# Patient Record
Sex: Male | Born: 1945 | Hispanic: No | Marital: Married | Smoking: Never smoker
Health system: Southern US, Community
[De-identification: ages and names within clinical notes are randomized; demographics above are authoritative.]

## PROBLEM LIST (undated history)

## (undated) DIAGNOSIS — E785 Hyperlipidemia, unspecified: Secondary | ICD-10-CM

## (undated) DIAGNOSIS — J449 Chronic obstructive pulmonary disease, unspecified: Secondary | ICD-10-CM

## (undated) DIAGNOSIS — D510 Vitamin B12 deficiency anemia due to intrinsic factor deficiency: Secondary | ICD-10-CM

## (undated) DIAGNOSIS — F79 Unspecified intellectual disabilities: Secondary | ICD-10-CM

## (undated) DIAGNOSIS — E1129 Type 2 diabetes mellitus with other diabetic kidney complication: Secondary | ICD-10-CM

## (undated) DIAGNOSIS — N183 Chronic kidney disease, stage 3 (moderate): Secondary | ICD-10-CM

## (undated) DIAGNOSIS — I10 Essential (primary) hypertension: Secondary | ICD-10-CM

## (undated) SURGERY — Surgical Case
Anesthesia: *Unknown

---

## 1997-08-28 ENCOUNTER — Encounter (HOSPITAL_COMMUNITY): Admission: RE | Admit: 1997-08-28 | Discharge: 1997-11-26 | Payer: Self-pay | Admitting: Internal Medicine

## 2000-08-29 ENCOUNTER — Emergency Department (HOSPITAL_COMMUNITY): Admission: EM | Admit: 2000-08-29 | Discharge: 2000-08-29 | Payer: Self-pay | Admitting: Emergency Medicine

## 2001-01-25 ENCOUNTER — Emergency Department (HOSPITAL_COMMUNITY): Admission: EM | Admit: 2001-01-25 | Discharge: 2001-01-25 | Payer: Self-pay

## 2003-04-02 ENCOUNTER — Ambulatory Visit: Admission: RE | Admit: 2003-04-02 | Discharge: 2003-04-02 | Payer: Self-pay | Admitting: Internal Medicine

## 2009-09-29 ENCOUNTER — Emergency Department (HOSPITAL_COMMUNITY): Admission: EM | Admit: 2009-09-29 | Discharge: 2009-09-30 | Payer: Self-pay | Admitting: Emergency Medicine

## 2014-11-18 ENCOUNTER — Emergency Department (HOSPITAL_COMMUNITY): Payer: Medicare Other

## 2014-11-18 ENCOUNTER — Encounter (HOSPITAL_COMMUNITY): Payer: Self-pay | Admitting: Emergency Medicine

## 2014-11-18 ENCOUNTER — Emergency Department (HOSPITAL_COMMUNITY)
Admission: EM | Admit: 2014-11-18 | Discharge: 2014-11-18 | Disposition: A | Discharge status: Home / Self Care | Payer: Medicare Other | Attending: Emergency Medicine | Admitting: Emergency Medicine

## 2014-11-18 DIAGNOSIS — Z7982 Long term (current) use of aspirin: Secondary | ICD-10-CM | POA: Diagnosis not present

## 2014-11-18 DIAGNOSIS — W01198A Fall on same level from slipping, tripping and stumbling with subsequent striking against other object, initial encounter: Secondary | ICD-10-CM | POA: Diagnosis not present

## 2014-11-18 DIAGNOSIS — S0081XA Abrasion of other part of head, initial encounter: Secondary | ICD-10-CM | POA: Insufficient documentation

## 2014-11-18 DIAGNOSIS — Z79899 Other long term (current) drug therapy: Secondary | ICD-10-CM | POA: Diagnosis not present

## 2014-11-18 DIAGNOSIS — Z862 Personal history of diseases of the blood and blood-forming organs and certain disorders involving the immune mechanism: Secondary | ICD-10-CM | POA: Insufficient documentation

## 2014-11-18 DIAGNOSIS — Y9222 Religious institution as the place of occurrence of the external cause: Secondary | ICD-10-CM | POA: Diagnosis not present

## 2014-11-18 DIAGNOSIS — E119 Type 2 diabetes mellitus without complications: Secondary | ICD-10-CM | POA: Insufficient documentation

## 2014-11-18 DIAGNOSIS — Y998 Other external cause status: Secondary | ICD-10-CM | POA: Diagnosis not present

## 2014-11-18 DIAGNOSIS — E669 Obesity, unspecified: Secondary | ICD-10-CM | POA: Insufficient documentation

## 2014-11-18 DIAGNOSIS — E785 Hyperlipidemia, unspecified: Secondary | ICD-10-CM | POA: Diagnosis not present

## 2014-11-18 DIAGNOSIS — Y9389 Activity, other specified: Secondary | ICD-10-CM | POA: Diagnosis not present

## 2014-11-18 DIAGNOSIS — W19XXXA Unspecified fall, initial encounter: Secondary | ICD-10-CM

## 2014-11-18 DIAGNOSIS — Z794 Long term (current) use of insulin: Secondary | ICD-10-CM | POA: Insufficient documentation

## 2014-11-18 DIAGNOSIS — J449 Chronic obstructive pulmonary disease, unspecified: Secondary | ICD-10-CM | POA: Insufficient documentation

## 2014-11-18 DIAGNOSIS — Z23 Encounter for immunization: Secondary | ICD-10-CM | POA: Diagnosis not present

## 2014-11-18 DIAGNOSIS — I10 Essential (primary) hypertension: Secondary | ICD-10-CM | POA: Insufficient documentation

## 2014-11-18 DIAGNOSIS — Z8659 Personal history of other mental and behavioral disorders: Secondary | ICD-10-CM | POA: Insufficient documentation

## 2014-11-18 DIAGNOSIS — S0990XA Unspecified injury of head, initial encounter: Secondary | ICD-10-CM | POA: Diagnosis present

## 2014-11-18 HISTORY — DX: Hyperlipidemia, unspecified: E78.5

## 2014-11-18 HISTORY — DX: Essential (primary) hypertension: I10

## 2014-11-18 HISTORY — DX: Vitamin B12 deficiency anemia due to intrinsic factor deficiency: D51.0

## 2014-11-18 HISTORY — DX: Unspecified intellectual disabilities: F79

## 2014-11-18 HISTORY — DX: Chronic obstructive pulmonary disease, unspecified: J44.9

## 2014-11-18 MED ORDER — TETANUS-DIPHTH-ACELL PERTUSSIS 5-2.5-18.5 LF-MCG/0.5 IM SUSP
0.5000 mL | Freq: Once | INTRAMUSCULAR | Status: AC
  Administered 2014-11-18: 0.5 mL via INTRAMUSCULAR
  Filled 2014-11-18: qty 0.5

## 2014-11-18 MED ORDER — BACITRACIN ZINC 500 UNIT/GM EX OINT
TOPICAL_OINTMENT | CUTANEOUS | Status: AC
  Administered 2014-11-18: 16:00:00
  Filled 2014-11-18: qty 0.9

## 2014-11-18 NOTE — ED Notes (Addendum)
Pt arrived to ED with caregiver from [facility] c/o fall and head lac.  Reports pt fell going up the steps while at church. Denies LOC or n/v.  Head lac covered, bleeding controlled. Pt ambulatory. NAD.

## 2014-11-18 NOTE — Discharge Instructions (Signed)

## 2014-11-18 NOTE — ED Provider Notes (Signed)
CSN: [id number]     Arrival date & time 11/18/14  1147 History   First MD Initiated Contact with Patient 11/18/14 1504     Chief Complaint  Patient presents with  . Fall  . Head Injury    Low 5 caveat due to mental retardation (Consider location/radiation/quality/duration/timing/severity/associated sxs/prior Treatment) Patient is a 69 y.o. male presenting with fall and head injury. The history is provided by the patient.  Fall Associated symptoms include headaches. Pertinent negatives include no chest pain and no shortness of breath.  Head Injury Associated symptoms: headache   patient was at church and slipped going up some stairs and hit his head. No loss conscious but states he has headaches. Does have a history of mental retardation. Denies other pain. No numbness or weakness. No confusion. Abrasions to his forehead. Unknown last tetanus shot  Past Medical History  Diagnosis Date  . Mental retardation   . Hypertension   . Diabetes mellitus without complication   . COPD (chronic obstructive pulmonary disease)   . Pernicious anemia   . Obesity   . Hyperlipidemia    History reviewed. No pertinent past surgical history. History reviewed. No pertinent family history. Social History  Substance Use Topics  . Smoking status: None  . Smokeless tobacco: None  . Alcohol Use: No    Review of Systems  Unable to perform ROS Respiratory: Negative for shortness of breath.   Cardiovascular: Negative for chest pain.  Skin: Positive for wound.  Neurological: Positive for headaches.  Hematological: Does not bruise/bleed easily.  Psychiatric/Behavioral: Negative for agitation.      Allergies  Review of patient's allergies indicates no known allergies.  Home Medications   Prior to Admission medications   Medication Sig Start Date End Date Taking? Authorizing Provider  aspirin EC 81 MG tablet Take 81 mg by mouth daily.   Yes Historical Provider, MD  atorvastatin (LIPITOR) 80 MG  tablet Take 80 mg by mouth daily.   Yes Historical Provider, MD  Cyanocobalamin (VITAMIN B 12 PO) Take 1,000 mcg by mouth daily.   Yes Historical Provider, MD  diclofenac sodium (VOLTAREN) 1 % GEL Apply 2 g topically every 6 (six) hours as needed (pain.).   Yes Historical Provider, MD  docusate (COLACE) 50 MG/5ML liquid Take 10 mg by mouth daily. Into each ear.   Yes Historical Provider, MD  escitalopram (LEXAPRO) 10 MG tablet Take 10 mg by mouth daily.   Yes Historical Provider, MD  guaiFENesin (ROBITUSSIN) 100 MG/5ML liquid Take 200 mg by mouth every 4 (four) hours as needed for cough.   Yes Historical Provider, MD  insulin detemir (LEVEMIR) 100 UNIT/ML injection Inject 75-140 Units into the skin at bedtime. 75 units at breakfast and 140 units nightly.   Yes Historical Provider, MD  lisinopril (PRINIVIL,ZESTRIL) 30 MG tablet Take 30 mg by mouth 2 (two) times daily.   Yes Historical Provider, MD  metFORMIN (GLUCOPHAGE) 1000 MG tablet Take 1,000 mg by mouth 2 (two) times daily with a meal.   Yes Historical Provider, MD  Oxymetazoline HCl (QC NASAL RELIEF SINUS NA) Place 2 sprays into the nose every 12 (twelve) hours as needed (congestion).   Yes Historical Provider, MD  polyvinyl alcohol (LIQUIFILM TEARS) 1.4 % ophthalmic solution Place 1 drop into both eyes 2 (two) times daily.   Yes Historical Provider, MD  ranitidine (ZANTAC) 150 MG capsule Take 150 mg by mouth 2 (two) times daily.   Yes Historical Provider, MD  traMADol (ULTRAM) 50 MG  tablet Take 50 mg by mouth at bedtime.   Yes Historical Provider, MD  white petrolatum (VASELINE) GEL Apply 1 application topically at bedtime. Apply to heels   Yes Historical Provider, MD   BP 144/80 mmHg  Pulse 99  Temp(Src) 98.2 F (36.8 C) (Oral)  Resp 18  SpO2 94% Physical Exam  Constitutional: He appears well-developed.  HENT:  Abrasion to right forehead around 3 cm. There is one deeper area but no clear laceration.  Eyes: Pupils are equal, round, and  reactive to light.  Cardiovascular: Normal rate and regular rhythm.   Pulmonary/Chest: Effort normal.  Abdominal: Soft.  Musculoskeletal: Normal range of motion.  Neurological: He is alert.  Skin: Skin is warm.    ED Course  Procedures (including critical care time) Labs Review Labs Reviewed - No data to display  Imaging Review Ct Head Wo Contrast  11/18/2014   CLINICAL DATA:  Fall at skilled nursing facility with trauma to the head and laceration. [place] going up steps.  EXAM: CT HEAD WITHOUT CONTRAST  TECHNIQUE: Contiguous axial images were obtained from the base of the skull through the vertex without intravenous contrast.  COMPARISON:  None.  FINDINGS: Brain shows mild age related atrophy with mild chronic small-vessel changes of the white matter. No sign of acute infarction, mass lesion, hemorrhage, hydrocephalus or extra-axial collection. No skull fracture. No fluid in the sinuses. There is atherosclerotic calcification of the major vessels at the base of the brain.  IMPRESSION: No acute or traumatic intracranial finding. Atrophy and mild chronic small vessel disease.   Electronically Signed   By: Nelson Chimes M.D.   On: 11/18/2014 15:51   I have personally reviewed and evaluated these images and lab results as part of my medical decision-making.   EKG Interpretation None      MDM   Final diagnoses:  Fall, initial encounter  Forehead abrasion, initial encounter    Fall. Forehead abrasion without suturable lac. Negative head CT. Will discharge home.    Davonna Belling, MD 11/18/14 [id number]

## 2016-07-18 ENCOUNTER — Inpatient Hospital Stay (HOSPITAL_COMMUNITY): Payer: Medicare Other

## 2016-07-18 ENCOUNTER — Encounter (HOSPITAL_COMMUNITY): Payer: Self-pay | Admitting: Emergency Medicine

## 2016-07-18 ENCOUNTER — Inpatient Hospital Stay (HOSPITAL_COMMUNITY)
Admission: EM | Admit: 2016-07-18 | Discharge: 2016-07-23 | DRG: 208 | Disposition: A | Discharge status: Skilled Nursing Facility | Payer: Medicare Other | Attending: Internal Medicine | Admitting: Internal Medicine

## 2016-07-18 ENCOUNTER — Emergency Department (HOSPITAL_COMMUNITY): Payer: Medicare Other

## 2016-07-18 DIAGNOSIS — I10 Essential (primary) hypertension: Secondary | ICD-10-CM | POA: Diagnosis present

## 2016-07-18 DIAGNOSIS — J9600 Acute respiratory failure, unspecified whether with hypoxia or hypercapnia: Secondary | ICD-10-CM | POA: Diagnosis present

## 2016-07-18 DIAGNOSIS — D649 Anemia, unspecified: Secondary | ICD-10-CM | POA: Diagnosis present

## 2016-07-18 DIAGNOSIS — Z794 Long term (current) use of insulin: Secondary | ICD-10-CM | POA: Diagnosis not present

## 2016-07-18 DIAGNOSIS — J189 Pneumonia, unspecified organism: Principal | ICD-10-CM | POA: Diagnosis present

## 2016-07-18 DIAGNOSIS — R4182 Altered mental status, unspecified: Secondary | ICD-10-CM | POA: Diagnosis present

## 2016-07-18 DIAGNOSIS — Z781 Physical restraint status: Secondary | ICD-10-CM | POA: Diagnosis not present

## 2016-07-18 DIAGNOSIS — R451 Restlessness and agitation: Secondary | ICD-10-CM | POA: Diagnosis present

## 2016-07-18 DIAGNOSIS — I639 Cerebral infarction, unspecified: Secondary | ICD-10-CM

## 2016-07-18 DIAGNOSIS — J449 Chronic obstructive pulmonary disease, unspecified: Secondary | ICD-10-CM | POA: Diagnosis present

## 2016-07-18 DIAGNOSIS — E1165 Type 2 diabetes mellitus with hyperglycemia: Secondary | ICD-10-CM | POA: Diagnosis present

## 2016-07-18 DIAGNOSIS — G9340 Encephalopathy, unspecified: Secondary | ICD-10-CM | POA: Diagnosis present

## 2016-07-18 DIAGNOSIS — F79 Unspecified intellectual disabilities: Secondary | ICD-10-CM | POA: Diagnosis present

## 2016-07-18 DIAGNOSIS — E785 Hyperlipidemia, unspecified: Secondary | ICD-10-CM | POA: Diagnosis present

## 2016-07-18 DIAGNOSIS — Z79899 Other long term (current) drug therapy: Secondary | ICD-10-CM | POA: Diagnosis not present

## 2016-07-18 DIAGNOSIS — R7989 Other specified abnormal findings of blood chemistry: Secondary | ICD-10-CM | POA: Diagnosis present

## 2016-07-18 DIAGNOSIS — E669 Obesity, unspecified: Secondary | ICD-10-CM | POA: Diagnosis present

## 2016-07-18 DIAGNOSIS — R9401 Abnormal electroencephalogram [EEG]: Secondary | ICD-10-CM | POA: Diagnosis present

## 2016-07-18 DIAGNOSIS — J9601 Acute respiratory failure with hypoxia: Secondary | ICD-10-CM

## 2016-07-18 LAB — I-STAT ARTERIAL BLOOD GAS, ED
BICARBONATE: 25.3 mmol/L (ref 20.0–28.0)
Bicarbonate: 27.2 mmol/L (ref 20.0–28.0)
O2 SAT: 99 %
O2 Saturation: 95 %
PCO2 ART: 54.4 mmHg — AB (ref 32.0–48.0)
TCO2: 29 mmol/L (ref 0–100)
TCO2: 27 mmol/L (ref 0–100)
pCO2 arterial: 42.4 mmHg (ref 32.0–48.0)
pH, Arterial: 7.307 — ABNORMAL LOW (ref 7.350–7.450)
pH, Arterial: 7.383 (ref 7.350–7.450)
pO2, Arterial: 161 mmHg — ABNORMAL HIGH (ref 83.0–108.0)
pO2, Arterial: 79 mmHg — ABNORMAL LOW (ref 83.0–108.0)

## 2016-07-18 LAB — I-STAT CHEM 8, ED
BUN: 26 mg/dL — ABNORMAL HIGH (ref 6–20)
Calcium, Ion: 1.11 mmol/L — ABNORMAL LOW (ref 1.15–1.40)
Chloride: 102 mmol/L (ref 101–111)
Creatinine, Ser: 1.2 mg/dL (ref 0.61–1.24)
Glucose, Bld: 352 mg/dL — ABNORMAL HIGH (ref 65–99)
HEMATOCRIT: 36 % — AB (ref 39.0–52.0)
HEMOGLOBIN: 12.2 g/dL — AB (ref 13.0–17.0)
POTASSIUM: 4.3 mmol/L (ref 3.5–5.1)
SODIUM: 137 mmol/L (ref 135–145)
TCO2: 27 mmol/L (ref 0–100)

## 2016-07-18 LAB — COMPREHENSIVE METABOLIC PANEL
ALT: 20 U/L (ref 17–63)
AST: 26 U/L (ref 15–41)
Albumin: 3.3 g/dL — ABNORMAL LOW (ref 3.5–5.0)
Alkaline Phosphatase: 56 U/L (ref 38–126)
Anion gap: 16 — ABNORMAL HIGH (ref 5–15)
BUN: 19 mg/dL (ref 6–20)
CHLORIDE: 98 mmol/L — AB (ref 101–111)
CO2: 23 mmol/L (ref 22–32)
Calcium: 9.2 mg/dL (ref 8.9–10.3)
Creatinine, Ser: 1.26 mg/dL — ABNORMAL HIGH (ref 0.61–1.24)
GFR calc Af Amer: >60 mL/min (ref >60)
GFR, EST NON AFRICAN AMERICAN: 56 mL/min — AB (ref >60)
Glucose, Bld: 341 mg/dL — ABNORMAL HIGH (ref 65–99)
POTASSIUM: 4.3 mmol/L (ref 3.5–5.1)
Sodium: 137 mmol/L (ref 135–145)
Total Bilirubin: 0.5 mg/dL (ref 0.3–1.2)
Total Protein: 6.4 g/dL — ABNORMAL LOW (ref 6.5–8.1)

## 2016-07-18 LAB — DIFFERENTIAL
BASOS ABS: 0 10*3/uL (ref 0.0–0.1)
BASOS PCT: 1 %
EOS ABS: 0.2 10*3/uL (ref 0.0–0.7)
Eosinophils Relative: 3 %
Lymphocytes Relative: 33 %
Lymphs Abs: 2.2 10*3/uL (ref 0.7–4.0)
MONOS PCT: 8 %
Monocytes Absolute: 0.5 10*3/uL (ref 0.1–1.0)
NEUTROS PCT: 55 %
Neutro Abs: 3.8 10*3/uL (ref 1.7–7.7)

## 2016-07-18 LAB — I-STAT TROPONIN, ED: TROPONIN I, POC: 0 ng/mL (ref 0.00–0.08)

## 2016-07-18 LAB — AMMONIA: AMMONIA: 50 umol/L — AB (ref 9–35)

## 2016-07-18 LAB — CBC
HCT: 36.3 % — ABNORMAL LOW (ref 39.0–52.0)
Hemoglobin: 11.9 g/dL — ABNORMAL LOW (ref 13.0–17.0)
MCH: 28.7 pg (ref 26.0–34.0)
MCHC: 32.8 g/dL (ref 30.0–36.0)
MCV: 87.7 fL (ref 78.0–100.0)
Platelets: 178 10*3/uL (ref 150–400)
RBC: 4.14 MIL/uL — ABNORMAL LOW (ref 4.22–5.81)
RDW: 13.9 % (ref 11.5–15.5)
WBC: 6.8 10*3/uL (ref 4.0–10.5)

## 2016-07-18 LAB — ETHANOL

## 2016-07-18 LAB — CBG MONITORING, ED: Glucose-Capillary: 346 mg/dL — ABNORMAL HIGH (ref 65–99)

## 2016-07-18 LAB — GLUCOSE, CAPILLARY: Glucose-Capillary: 290 mg/dL — ABNORMAL HIGH (ref 65–99)

## 2016-07-18 LAB — PROTIME-INR
INR: 0.99
PROTHROMBIN TIME: 13.1 s (ref 11.4–15.2)

## 2016-07-18 LAB — APTT: APTT: 27 s (ref 24–36)

## 2016-07-18 MED ORDER — SODIUM CHLORIDE 0.9 % IV BOLUS (SEPSIS)
1000.0000 mL | Freq: Once | INTRAVENOUS | Status: AC
  Administered 2016-07-18: 1000 mL via INTRAVENOUS

## 2016-07-18 MED ORDER — PANTOPRAZOLE SODIUM 40 MG IV SOLR
40.0000 mg | INTRAVENOUS | Status: DC
  Administered 2016-07-19 – 2016-07-23 (×5): 40 mg via INTRAVENOUS
  Filled 2016-07-18 (×6): qty 40

## 2016-07-18 MED ORDER — MIDAZOLAM HCL 2 MG/2ML IJ SOLN: 1.0000 mg | INTRAMUSCULAR | Status: DC | PRN

## 2016-07-18 MED ORDER — ROCURONIUM BROMIDE 50 MG/5ML IV SOLN
INTRAVENOUS | Status: DC | PRN
  Administered 2016-07-18: 100 mg via INTRAVENOUS

## 2016-07-18 MED ORDER — FENTANYL 2500MCG IN NS 250ML (10MCG/ML) PREMIX INFUSION: 25.0000 ug/h | INTRAVENOUS | Status: DC

## 2016-07-18 MED ORDER — FENTANYL 2500MCG IN NS 250ML (10MCG/ML) PREMIX INFUSION
40.0000 ug/h | INTRAVENOUS | Status: DC
  Administered 2016-07-18: 40 ug/h via INTRAVENOUS
  Filled 2016-07-18: qty 250

## 2016-07-18 MED ORDER — FENTANYL BOLUS VIA INFUSION
25.0000 ug | INTRAVENOUS | Status: DC | PRN
  Filled 2016-07-18: qty 25

## 2016-07-18 MED ORDER — FENTANYL CITRATE (PF) 100 MCG/2ML IJ SOLN: 50.0000 ug | INTRAMUSCULAR | Status: DC | PRN

## 2016-07-18 MED ORDER — METHYLPREDNISOLONE SODIUM SUCC 125 MG IJ SOLR: 125.0000 mg | Freq: Once | INTRAMUSCULAR | Status: DC

## 2016-07-18 MED ORDER — IOPAMIDOL (ISOVUE-370) INJECTION 76%
INTRAVENOUS | Status: AC
  Administered 2016-07-18: 50 mL
  Filled 2016-07-18: qty 50

## 2016-07-18 MED ORDER — SODIUM CHLORIDE 0.9 % IV SOLN: 250.0000 mL | INTRAVENOUS | Status: DC | PRN

## 2016-07-18 MED ORDER — ENOXAPARIN SODIUM 40 MG/0.4ML ~~LOC~~ SOLN
40.0000 mg | SUBCUTANEOUS | Status: DC
  Administered 2016-07-19 – 2016-07-23 (×5): 40 mg via SUBCUTANEOUS
  Filled 2016-07-18 (×7): qty 0.4

## 2016-07-18 MED ORDER — ETOMIDATE 2 MG/ML IV SOLN
INTRAVENOUS | Status: DC | PRN
  Administered 2016-07-18: 20 mg via INTRAVENOUS

## 2016-07-18 NOTE — Progress Notes (Signed)
[facility] [place] [facility] Progress Note Patient Name: Mark Chang DOB: December 12, 1945 MRN: [id number]   Date of Service  07/18/2016  HPI/Events of Note  Mulitiple issues: 1. Agitation - Request for bilateral soft wrist restraints, 2. Request for Foley Catheter and 3. Needs Stress ulcer prophylaxis.   eICU Interventions  Will order: 1. Bilateral soft wrist restraints. 2. Place Foley catheter. 3. Protonix 40 mg IV now and Q day.      Intervention Category Intermediate Interventions: Best-practice therapies (e.g. DVT, beta blocker, etc.) Minor Interventions: Agitation / anxiety - evaluation and management  Lysle Dingwall 07/18/2016, 11:27 PM

## 2016-07-18 NOTE — Consult Note (Signed)
Admission H&P    Chief Complaint: Obtunded state of acute onset.  HPI: Mark Chang is an 71 y.o. male with a history of mental retardation, hypertension, hyperlipidemia, diabetes mellitus, COPD and obesity who was brought from local nursing facility where he resides to the ED with a history of sudden change in mental status with rapidly deteriorating level of responsiveness. No clear focal deficit was noted. No seizure activity reported and patient has no documentation of seizure disorder. He was intubated in the ED for airway protection. CT scan of his head showed no acute intracranial abnormality. CBG was 346. He was afebrile. Electrolytes were unremarkable. CBC count was 6.8. Blood pressure was 152/79 and patient was tachycardic in the 120s to 140s. Chest x-ray showed a hazy opacity of lungs which was thought to possibly represent atelectasis or pulmonary edema. CT angiogram of the head and neck showed no cervical or intracranial major vessel occlusion or significant stenosis.  Past Medical History:  Diagnosis Date  . COPD (chronic obstructive pulmonary disease) ([place])   . Diabetes mellitus without complication ([place])   . Hyperlipidemia   . Hypertension   . Mental retardation   . Obesity   . Pernicious anemia     History reviewed. No pertinent surgical history.  No family history on file. Social History:  does not have a smoking history on file. He has never used smokeless tobacco. He reports that he does not drink alcohol. His drug history is not on file.  Allergies: No Known Allergies  Medications: Preadmission medications were reviewed by me.  ROS: Unavailable due to patient's obtunded state.  Physical Examination: Blood pressure (!) 141/72, pulse (!) 124, resp. rate (!) 23, height '5\' 8"'$  (1.727 m), SpO2 100 %.  HEENT-  Normocephalic, no lesions, without obvious abnormality.  Normal external eye and conjunctiva.  Normal TM's bilaterally.  Normal auditory canals and external  ears. Normal external nose, mucus membranes and septum.  Normal pharynx. Neck supple with no masses, nodes, nodules or enlargement. Cardiovascular - Tachycardic, normal S1 and S2, no murmur or gallop Lungs - chest clear, no wheezing, rales, normal symmetric air entry Abdomen - soft, non-tender; bowel sounds normal; no masses,  no organomegaly Extremities - no joint deformities, effusion, or inflammation  Neurologic Examination: Patient was intubated and on mechanical ventilation. No spontaneous respirations were noted. He was on no sedating medications at the time of this evaluation. He received succinylcholine and etomidate prior to intubation. Pupils were equal and did not react to light. Extraocular movements were absent with oculocephalic maneuvers. Face was symmetrical with no signs of focal weakness. Motor exam showed flaccid muscle tone throughout. Patient had no spontaneous movements no withdrawal movements to noxious stimuli. There was no abnormal posturing. Deep tendon reflexes were absent bilaterally. Plantar responses were mute.  Results for orders placed or performed during the hospital encounter of 07/18/16 (from the past 48 hour(s))  Protime-INR     Status: None   Collection Time: 07/18/16  6:44 PM  Result Value Ref Range   Prothrombin Time 13.1 11.4 - 15.2 seconds   INR 0.99   APTT     Status: None   Collection Time: 07/18/16  6:44 PM  Result Value Ref Range   aPTT 27 24 - 36 seconds  CBC     Status: Abnormal   Collection Time: 07/18/16  6:44 PM  Result Value Ref Range   WBC 6.8 4.0 - 10.5 K/uL   RBC 4.14 (L) 4.22 - 5.81 MIL/uL  Hemoglobin 11.9 (L) 13.0 - 17.0 g/dL   HCT 36.3 (L) 39.0 - 52.0 %   MCV 87.7 78.0 - 100.0 fL   MCH 28.7 26.0 - 34.0 pg   MCHC 32.8 30.0 - 36.0 g/dL   RDW 13.9 11.5 - 15.5 %   Platelets 178 150 - 400 K/uL  Differential     Status: None   Collection Time: 07/18/16  6:44 PM  Result Value Ref Range   Neutrophils Relative % 55 %   Neutro  Abs 3.8 1.7 - 7.7 K/uL   Lymphocytes Relative 33 %   Lymphs Abs 2.2 0.7 - 4.0 K/uL   Monocytes Relative 8 %   Monocytes Absolute 0.5 0.1 - 1.0 K/uL   Eosinophils Relative 3 %   Eosinophils Absolute 0.2 0.0 - 0.7 K/uL   Basophils Relative 1 %   Basophils Absolute 0.0 0.0 - 0.1 K/uL  Comprehensive metabolic panel     Status: Abnormal   Collection Time: 07/18/16  6:44 PM  Result Value Ref Range   Sodium 137 135 - 145 mmol/L   Potassium 4.3 3.5 - 5.1 mmol/L   Chloride 98 (L) 101 - 111 mmol/L   CO2 23 22 - 32 mmol/L   Glucose, Bld 341 (H) 65 - 99 mg/dL   BUN 19 6 - 20 mg/dL   Creatinine, Ser 1.26 (H) 0.61 - 1.24 mg/dL   Calcium 9.2 8.9 - 10.3 mg/dL   Total Protein 6.4 (L) 6.5 - 8.1 g/dL   Albumin 3.3 (L) 3.5 - 5.0 g/dL   AST 26 15 - 41 U/L   ALT 20 17 - 63 U/L   Alkaline Phosphatase 56 38 - 126 U/L   Total Bilirubin 0.5 0.3 - 1.2 mg/dL   GFR calc non Af Amer 56 (L) >60 mL/min   GFR calc Af Amer >60 >60 mL/min    Comment: (NOTE) The eGFR has been calculated using the CKD EPI equation. This calculation has not been validated in all clinical situations. eGFR's persistently <60 mL/min signify possible Chronic Kidney Disease.    Anion gap 16 (H) 5 - 15  I-stat troponin, ED (not at [facility], [facility])     Status: None   Collection Time: 07/18/16  6:46 PM  Result Value Ref Range   Troponin i, poc 0.00 0.00 - 0.08 ng/mL   Comment 3            Comment: Due to the release kinetics of cTnI, a negative result within the first hours of the onset of symptoms does not rule out myocardial infarction with certainty. If myocardial infarction is still suspected, repeat the test at appropriate intervals.   I-Stat Chem 8, ED  (not at [facility], [facility])     Status: Abnormal   Collection Time: 07/18/16  6:48 PM  Result Value Ref Range   Sodium 137 135 - 145 mmol/L   Potassium 4.3 3.5 - 5.1 mmol/L   Chloride 102 101 - 111 mmol/L   BUN 26 (H) 6 - 20 mg/dL   Creatinine, Ser 1.20 0.61 - 1.24 mg/dL   Glucose,  Bld 352 (H) 65 - 99 mg/dL   Calcium, Ion 1.11 (L) 1.15 - 1.40 mmol/L   TCO2 27 0 - 100 mmol/L   Hemoglobin 12.2 (L) 13.0 - 17.0 g/dL   HCT 36.0 (L) 39.0 - 52.0 %  CBG monitoring, ED     Status: Abnormal   Collection Time: 07/18/16  6:48 PM  Result Value Ref Range   Glucose-Capillary  346 (H) 65 - 99 mg/dL   Dg Chest Portable 1 View  Result Date: 07/18/2016 CLINICAL DATA:  71 y/o  M; post intubation. EXAM: PORTABLE CHEST 1 VIEW COMPARISON:  None. FINDINGS: Endotracheal tube tip 2.6 cm from carina. Low lung volumes. Hazy opacification of lungs may represent edema or atelectasis. Normal cardiac silhouette given projection and technique. No pneumothorax. No acute osseous abnormality is evident. Enteric tube tip below the field of view abdomen. IMPRESSION: Endotracheal tube tip 2.6 cm from the carina. Enteric tube tip below the field of view and abdomen. Low lung volumes. Hazy lung may represent edema or atelectasis. Electronically Signed   By: Kristine Garbe M.D.   On: 07/18/2016 19:16   Ct Head Code Stroke Wo Contrast  Result Date: 07/18/2016 CLINICAL DATA:  Code stroke.  Altered mental status EXAM: CT HEAD WITHOUT CONTRAST TECHNIQUE: Contiguous axial images were obtained from the base of the skull through the vertex without intravenous contrast. COMPARISON:  Head CT 11/18/2014 FINDINGS: Brain: No mass lesion, intraparenchymal hemorrhage or extra-axial collection. No evidence of acute cortical infarct. There is periventricular hypoattenuation compatible with chronic microvascular disease. Vascular: Atherosclerotic calcification of the vertebral and internal carotid arteries at the skull base. Skull: Normal visualized skull base, calvarium and extracranial soft tissues. Sinuses/Orbits: No sinus fluid levels or advanced mucosal thickening. No mastoid effusion. Normal orbits. ASPECTS [facility] Stroke Program Early CT Score) - Ganglionic level infarction (caudate, lentiform nuclei, internal capsule,  insula, M1-M3 cortex): 7 - Supraganglionic infarction (M4-M6 cortex): 3 Total score (0-10 with 10 being normal): 10 IMPRESSION: 1. No acute intracranial abnormality. 2. ASPECTS is 10. These results were called by telephone at the time of interpretation on 07/18/2016 at 7:31 pm to Dr. Wallie Char, who verbally acknowledged these results. Electronically Signed   By: Ulyses Jarred M.D.   On: 07/18/2016 19:32    Assessment/Plan 71 year old man with multiple systemic medical disorders as well as mental retardation presenting with obtundation of sudden onset and rapid progression, of unclear etiology. Exam showed no focal findings. CT scan of his head, as well as CT angiogram of the head and neck were unremarkable. Laboratory studies were unremarkable except for hyperglycemia in the 300s.  Recommendations: 1. STAT EEG to rule out subclinical status epilepticus, as well as assess severity of encephalopathy 2. Continues EEG monitoring if seizure activity is seen on EEG 3. No anti-convulsant medication indicated unless EEG shows seizure activity, or patient has a witnessed clinical seizure 4. MRI of the brain without contrast 5. Urine drug screen  We will continue to follow this patient with you.  This patient is critically ill and at significant risk of neurological worsening or death, and care requires constant monitoring of vital signs, hemodynamics,respiratory and cardiac monitoring, neurological assessment, discussion with family, other specialists and medical decision making of high complexity. Total critical care time was 60 minutes.  C.R. Nicole Kindred, MD [facility] [phone]  07/18/2016, 7:46 PM

## 2016-07-18 NOTE — ED Notes (Signed)
Pt BP 84/53; fentanyl titrated down; 1 L bolus started.

## 2016-07-18 NOTE — ED Triage Notes (Signed)
Pt in from [facility] via [facility] with AMS, GCS of 4 on ED arrival. Per EMS, pt is a&ox3 baseline, ambulatory. Per EMS, pt was helping with dinner at facility and was in a chair, slumped and became altered, quickly non-respondent to stimuli. Hx of MR, DM -CBG was 346. BP was 112/74. Snoring on arrival, spontaneous respirations, sats of 96% on RA. GCS of 4.

## 2016-07-18 NOTE — ED Notes (Signed)
Can not do complete NIH, pt intubated and unresponsive.

## 2016-07-18 NOTE — ED Notes (Signed)
Completed stroke swallow screen clicked off in error.

## 2016-07-18 NOTE — ED Provider Notes (Signed)
[facility] DEPT Provider Note   CSN: [id number] Arrival date & time: 07/18/16  1830     History   Chief Complaint Chief Complaint  Patient presents with  . Altered Mental Status    HPI Mark Chang is a 71 y.o. male.  Patient with history of mental retardation, COPD, diabetes, high blood pressure presents after witnessed sudden onset altered mental status. Patient was helping with dinner and was in the chair and slumped forward and became unresponsive. Patient had snoring respirations since then. Patient is full code per report. Unable to get a hold of      Past Medical History:  Diagnosis Date  . COPD (chronic obstructive pulmonary disease) ([place])   . Diabetes mellitus without complication ([place])   . Hyperlipidemia   . Hypertension   . Mental retardation   . Obesity   . Pernicious anemia     Patient Active Problem List   Diagnosis Date Noted  . Encephalopathy 07/18/2016    History reviewed. No pertinent surgical history.     Home Medications    Prior to Admission medications   Medication Sig Start Date End Date Taking? Authorizing Provider  acetaminophen (TYLENOL) 500 MG tablet Take 500 mg by mouth 3 (three) times daily as needed for moderate pain.   Yes Historical Provider, MD  atorvastatin (LIPITOR) 20 MG tablet Take 20 mg by mouth daily.    Yes Historical Provider, MD  Cyanocobalamin (VITAMIN B 12 PO) Take 1,000 mcg by mouth daily.   Yes Historical Provider, MD  docusate (COLACE) 50 MG/5ML liquid Take 10 mg by mouth daily. Into each ear.   Yes Historical Provider, MD  docusate sodium (COLACE) 100 MG capsule Take 100 mg by mouth 2 (two) times daily.   Yes Historical Provider, MD  escitalopram (LEXAPRO) 10 MG tablet Take 10 mg by mouth daily.   Yes Historical Provider, MD  furosemide (LASIX) 40 MG tablet Take 40 mg by mouth daily.   Yes Historical Provider, MD  insulin detemir (LEVEMIR) 100 UNIT/ML injection Inject 33 Units into the skin every morning.     Yes Historical Provider, MD  lisinopril (PRINIVIL,ZESTRIL) 40 MG tablet Take 40 mg by mouth 2 (two) times daily.    Yes Historical Provider, MD  Melatonin 5 MG CAPS Take 5 mg by mouth at bedtime.   Yes Historical Provider, MD  metFORMIN (GLUCOPHAGE-XR) 500 MG 24 hr tablet Take 1,000 mg by mouth 2 (two) times daily.   Yes Historical Provider, MD  omega-3 acid ethyl esters (LOVAZA) 1 g capsule Take 1 g by mouth 2 (two) times daily.   Yes Historical Provider, MD  Oxymetazoline HCl (QC NASAL RELIEF SINUS NA) Place 2 sprays into the nose every 12 (twelve) hours as needed (congestion).   Yes Historical Provider, MD  potassium chloride SA (K-DUR,KLOR-CON) 20 MEQ tablet Take 20 mEq by mouth daily.   Yes Historical Provider, MD  ranitidine (ZANTAC) 150 MG capsule Take 150 mg by mouth 2 (two) times daily.   Yes Historical Provider, MD  white petrolatum (VASELINE) GEL Apply 1 application topically at bedtime. Apply to heels   Yes Historical Provider, MD    Family History No family history on file.  Social History Social History  Substance Use Topics  . Smoking status: Not on file  . Smokeless tobacco: Never Used  . Alcohol use No     Allergies   Patient has no known allergies.   Review of Systems Review of Systems   Physical  Exam Updated Vital Signs BP (!) 110/58   Pulse (!) 105   Temp 98.8 F (37.1 C) (Oral)   Resp (!) 22   Ht 5\' 8"  (1.727 m)   SpO2 100%   Physical Exam  Constitutional: He appears distressed.  HENT:  Head: Normocephalic.  Eyes: Pupils are equal, round, and reactive to light.  Neck: Neck supple.  Cardiovascular: Tachycardia present.   Pulmonary/Chest: He has rales (decr effort).  Abdominal: Soft. He exhibits no distension.  Musculoskeletal: He exhibits edema (mild bilateral LE).  Neurological: GCS eye subscore is 1. GCS verbal subscore is 1. GCS motor subscore is 2.  Patient is unresponsive. No response to painful stimuli. Pupils 3 mm bilateral minimally  responsive to light. Difficult neuro exam due to presentation.  Skin: Skin is warm.  Psychiatric:  unresponsive  Nursing note and vitals reviewed.    ED Treatments / Results  Labs (all labs ordered are listed, but only abnormal results are displayed) Labs Reviewed  CBC - Abnormal; Notable for the following:       Result Value   RBC 4.14 (*)    Hemoglobin 11.9 (*)    HCT 36.3 (*)    All other components within normal limits  COMPREHENSIVE METABOLIC PANEL - Abnormal; Notable for the following:    Chloride 98 (*)    Glucose, Bld 341 (*)    Creatinine, Ser 1.26 (*)    Total Protein 6.4 (*)    Albumin 3.3 (*)    GFR calc non Af Amer 56 (*)    Anion gap 16 (*)    All other components within normal limits  AMMONIA - Abnormal; Notable for the following:    Ammonia 50 (*)    All other components within normal limits  GLUCOSE, CAPILLARY - Abnormal; Notable for the following:    Glucose-Capillary 290 (*)    All other components within normal limits  GLUCOSE, CAPILLARY - Abnormal; Notable for the following:    Glucose-Capillary 346 (*)    All other components within normal limits  I-STAT CHEM 8, ED - Abnormal; Notable for the following:    BUN 26 (*)    Glucose, Bld 352 (*)    Calcium, Ion 1.11 (*)    Hemoglobin 12.2 (*)    HCT 36.0 (*)    All other components within normal limits  CBG MONITORING, ED - Abnormal; Notable for the following:    Glucose-Capillary 346 (*)    All other components within normal limits  I-STAT ARTERIAL BLOOD GAS, ED - Abnormal; Notable for the following:    pH, Arterial 7.307 (*)    pCO2 arterial 54.4 (*)    pO2, Arterial 161.0 (*)    All other components within normal limits  I-STAT ARTERIAL BLOOD GAS, ED - Abnormal; Notable for the following:    pO2, Arterial 79.0 (*)    All other components within normal limits  MRSA PCR SCREENING  ETHANOL  PROTIME-INR  APTT  DIFFERENTIAL  RAPID URINE DRUG SCREEN, HOSP PERFORMED  URINALYSIS, ROUTINE W  REFLEX MICROSCOPIC  BLOOD GAS, ARTERIAL  CBC  I-STAT TROPOININ, ED    EKG  EKG Interpretation None       Radiology Ct Angio Head W Or Wo Contrast  Result Date: 07/18/2016 CLINICAL DATA:  71 y/o  M; code stroke altered mental status. EXAM: CT ANGIOGRAPHY HEAD AND NECK TECHNIQUE: Multidetector CT imaging of the head and neck was performed using the standard protocol during bolus administration of intravenous contrast. Multiplanar  CT image reconstructions and MIPs were obtained to evaluate the vascular anatomy. Carotid stenosis measurements (when applicable) are obtained utilizing NASCET criteria, using the distal internal carotid diameter as the denominator. CONTRAST:  50 cc Isovue 370 COMPARISON:  07/18/2016 CT of the head. FINDINGS: CTA NECK FINDINGS Aortic arch: Three-vessel arch. Moderate calcific atherosclerosis of the arch and great vessel origins without high-grade stenosis. Right carotid system: No evidence of dissection, stenosis (50% or greater) or occlusion. Minimal calcific atherosclerosis of the carotid bifurcation. Left carotid system: No evidence of dissection, stenosis (50% or greater) or occlusion. Mild calcified plaque of proximal ICA with mild 30-40% stenosis. Vertebral arteries: Codominant. No evidence of dissection, stenosis (50% or greater) or occlusion. Skeleton: Mild-to-moderate cervical spondylosis greatest at the C5 through C7 levels. No high-grade bony canal stenosis or foraminal narrowing. Other neck: Negative. Endotracheal and enteric tubes extend below the field of view. Upper chest: Negative. Review of the MIP images confirms the above findings CTA HEAD FINDINGS Anterior circulation: No significant stenosis, proximal occlusion, aneurysm, or vascular malformation. Moderate calcified plaque of cavernous and paraclinoid segments with mild paraclinoid stenosis bilaterally. Posterior circulation: No significant stenosis, proximal occlusion, aneurysm, or vascular malformation.  Calcified plaque of bilateral V4 segments with mild stenosis. Venous sinuses: As permitted by contrast timing, patent. Anatomic variants: Patent anterior communicating artery. Large left posterior communicating artery and small left P1 segment compatible with persistent fetal circulation. No right posterior communicating artery identified, likely hypoplastic or absent. Review of the MIP images confirms the above findings IMPRESSION: 1. Carotid and vertebral arteries are patent. No evidence of dissection, high-grade stenosis, or occlusion. 2. Patent circle of Willis. No evidence of proximal occlusion, high-grade stenosis, aneurysm, or vascular malformation. 3. Left proximal internal carotid artery calcified plaque with mild 30-40% stenosis. 4. Bilateral cavernous/paraclinoid internal carotid arteries and the V4 segment calcified plaque with mild stenosis. These results were called by telephone at the time of interpretation on 07/18/2016 at 7:49 pm to Dr. Reather Converse, who verbally acknowledged these results. Electronically Signed   By: Kristine Garbe M.D.   On: 07/18/2016 19:53   Ct Angio Neck W Or Wo Contrast  Result Date: 07/18/2016 CLINICAL DATA:  71 y/o  M; code stroke altered mental status. EXAM: CT ANGIOGRAPHY HEAD AND NECK TECHNIQUE: Multidetector CT imaging of the head and neck was performed using the standard protocol during bolus administration of intravenous contrast. Multiplanar CT image reconstructions and MIPs were obtained to evaluate the vascular anatomy. Carotid stenosis measurements (when applicable) are obtained utilizing NASCET criteria, using the distal internal carotid diameter as the denominator. CONTRAST:  50 cc Isovue 370 COMPARISON:  07/18/2016 CT of the head. FINDINGS: CTA NECK FINDINGS Aortic arch: Three-vessel arch. Moderate calcific atherosclerosis of the arch and great vessel origins without high-grade stenosis. Right carotid system: No evidence of dissection, stenosis (50% or  greater) or occlusion. Minimal calcific atherosclerosis of the carotid bifurcation. Left carotid system: No evidence of dissection, stenosis (50% or greater) or occlusion. Mild calcified plaque of proximal ICA with mild 30-40% stenosis. Vertebral arteries: Codominant. No evidence of dissection, stenosis (50% or greater) or occlusion. Skeleton: Mild-to-moderate cervical spondylosis greatest at the C5 through C7 levels. No high-grade bony canal stenosis or foraminal narrowing. Other neck: Negative. Endotracheal and enteric tubes extend below the field of view. Upper chest: Negative. Review of the MIP images confirms the above findings CTA HEAD FINDINGS Anterior circulation: No significant stenosis, proximal occlusion, aneurysm, or vascular malformation. Moderate calcified plaque of cavernous and paraclinoid segments with  mild paraclinoid stenosis bilaterally. Posterior circulation: No significant stenosis, proximal occlusion, aneurysm, or vascular malformation. Calcified plaque of bilateral V4 segments with mild stenosis. Venous sinuses: As permitted by contrast timing, patent. Anatomic variants: Patent anterior communicating artery. Large left posterior communicating artery and small left P1 segment compatible with persistent fetal circulation. No right posterior communicating artery identified, likely hypoplastic or absent. Review of the MIP images confirms the above findings IMPRESSION: 1. Carotid and vertebral arteries are patent. No evidence of dissection, high-grade stenosis, or occlusion. 2. Patent circle of Willis. No evidence of proximal occlusion, high-grade stenosis, aneurysm, or vascular malformation. 3. Left proximal internal carotid artery calcified plaque with mild 30-40% stenosis. 4. Bilateral cavernous/paraclinoid internal carotid arteries and the V4 segment calcified plaque with mild stenosis. These results were called by telephone at the time of interpretation on 07/18/2016 at 7:49 pm to Dr. Reather Converse,  who verbally acknowledged these results. Electronically Signed   By: Kristine Garbe M.D.   On: 07/18/2016 19:53   Dg Chest Portable 1 View  Result Date: 07/18/2016 CLINICAL DATA:  71 y/o  M; post intubation. EXAM: PORTABLE CHEST 1 VIEW COMPARISON:  None. FINDINGS: Endotracheal tube tip 2.6 cm from carina. Low lung volumes. Hazy opacification of lungs may represent edema or atelectasis. Normal cardiac silhouette given projection and technique. No pneumothorax. No acute osseous abnormality is evident. Enteric tube tip below the field of view abdomen. IMPRESSION: Endotracheal tube tip 2.6 cm from the carina. Enteric tube tip below the field of view and abdomen. Low lung volumes. Hazy lung may represent edema or atelectasis. Electronically Signed   By: Kristine Garbe M.D.   On: 07/18/2016 19:16   Ct Head Code Stroke Wo Contrast  Result Date: 07/18/2016 CLINICAL DATA:  Code stroke.  Altered mental status EXAM: CT HEAD WITHOUT CONTRAST TECHNIQUE: Contiguous axial images were obtained from the base of the skull through the vertex without intravenous contrast. COMPARISON:  Head CT 11/18/2014 FINDINGS: Brain: No mass lesion, intraparenchymal hemorrhage or extra-axial collection. No evidence of acute cortical infarct. There is periventricular hypoattenuation compatible with chronic microvascular disease. Vascular: Atherosclerotic calcification of the vertebral and internal carotid arteries at the skull base. Skull: Normal visualized skull base, calvarium and extracranial soft tissues. Sinuses/Orbits: No sinus fluid levels or advanced mucosal thickening. No mastoid effusion. Normal orbits. ASPECTS [facility] Stroke Program Early CT Score) - Ganglionic level infarction (caudate, lentiform nuclei, internal capsule, insula, M1-M3 cortex): 7 - Supraganglionic infarction (M4-M6 cortex): 3 Total score (0-10 with 10 being normal): 10 IMPRESSION: 1. No acute intracranial abnormality. 2. ASPECTS is 10. These  results were called by telephone at the time of interpretation on 07/18/2016 at 7:31 pm to Dr. Wallie Char, who verbally acknowledged these results. Electronically Signed   By: Ulyses Jarred M.D.   On: 07/18/2016 19:32    Procedures Procedures (including critical care time) I was present for intubation performed by resident Dr Ericka Pontiff.  CRITICAL CARE Performed by: Mariea Clonts   Total critical care time: 75 minutes  Critical care time was exclusive of separately billable procedures and treating other patients.  Critical care was necessary to treat or prevent imminent or life-threatening deterioration.  Critical care was time spent personally by me on the following activities: development of treatment plan with patient and/or surrogate as well as nursing, discussions with consultants, evaluation of patient's response to treatment, examination of patient, obtaining history from patient or surrogate, ordering and performing treatments and interventions, ordering and review of laboratory studies, ordering and  review of radiographic studies, pulse oximetry and re-evaluation of patient's condition.\  INTUBATION Performed by: Mariea Clonts  Required items: required blood products, implants, devices, and special equipment available Patient identity confirmed: provided demographic data and hospital-assigned identification number Time out: Immediately prior to procedure a "time out" was called to verify the correct patient, procedure, equipment, support staff.  Indications: dyspnea Intubation method: direct Preoxygenation: BVM Sedatives: Etomidate Paralytic: rocuronium Tube Size: 8 cuffed  Post-procedure assessment: chest rise and ETCO2 monitor Breath sounds: equal and absent over the epigastrium Tube secured with: ETT holder Chest x-ray interpreted by me.  Chest x-ray findings: endotracheal tube in appropriate position  Patient tolerated the procedure well with no immediate  complications.  Medications Ordered in ED Medications  etomidate (AMIDATE) injection (20 mg Intravenous Given 07/18/16 1852)  rocuronium (ZEMURON) injection (100 mg Intravenous Given 07/18/16 1853)  methylPREDNISolone sodium succinate (SOLU-MEDROL) 125 mg/2 mL injection 125 mg (not administered)  0.9 %  sodium chloride infusion (not administered)  enoxaparin (LOVENOX) injection 40 mg (not administered)  fentaNYL 2568mcg in NS 23mL (67mcg/ml) infusion-PREMIX (not administered)  fentaNYL (SUBLIMAZE) bolus via infusion 25 mcg (not administered)  midazolam (VERSED) injection 1 mg (not administered)  pantoprazole (PROTONIX) injection 40 mg (not administered)  sodium chloride 0.9 % bolus 1,000 mL (0 mLs Intravenous Stopped 07/18/16 2000)  iopamidol (ISOVUE-370) 76 % injection (50 mLs  Contrast Given 07/18/16 1915)  sodium chloride 0.9 % bolus 1,000 mL (1,000 mLs Intravenous New Bag/Given 07/18/16 2202)     Initial Impression / Assessment and Plan / ED Course  I have reviewed the triage vital signs and the nursing notes.  Pertinent labs & imaging results that were available during my care of the patient were reviewed by me and considered in my medical decision making (see chart for details).    Patient presents unresponsive and per report within the past 2 hours. Code stroke called. Discussed the case at bedside with stroke neurologist. Initially concern for significant bleed. POC glucose normal. No family at bedside.  CT scan no acute findings. Patient intubated prior to going to CT scan for airway protection. Discussed the case with critical care for admission and further evaluation.  The patients results and plan were reviewed and discussed.   Any x-rays performed were independently reviewed by myself.   Differential diagnosis were considered with the presenting HPI.  Medications  etomidate (AMIDATE) injection (20 mg Intravenous Given 07/18/16 1852)  rocuronium [facility]) injection (100 mg  Intravenous Given 07/18/16 1853)  methylPREDNISolone sodium succinate (SOLU-MEDROL) 125 mg/2 mL injection 125 mg (not administered)  0.9 %  sodium chloride infusion (not administered)  enoxaparin (LOVENOX) injection 40 mg (not administered)  fentaNYL 2585mcg in NS 276mL (53mcg/ml) infusion-PREMIX (not administered)  fentaNYL (SUBLIMAZE) bolus via infusion 25 mcg (not administered)  midazolam (VERSED) injection 1 mg (not administered)  pantoprazole (PROTONIX) injection 40 mg (not administered)  sodium chloride 0.9 % bolus 1,000 mL (0 mLs Intravenous Stopped 07/18/16 2000)  iopamidol (ISOVUE-370) 76 % injection (50 mLs  Contrast Given 07/18/16 1915)  sodium chloride 0.9 % bolus 1,000 mL (1,000 mLs Intravenous New Bag/Given 07/18/16 2202)    Vitals:   07/18/16 2330 07/18/16 2345 07/19/16 0000 07/19/16 0015  BP: (!) 98/54 (!) 99/58 (!) 110/58   Pulse: (!) 108 (!) 108 (!) 105   Resp: (!) 22 20 (!) 22   Temp:    98.8 F (37.1 C)  TempSrc:    Oral  SpO2: 100% 100% 100%   Height:  Final diagnoses:  Altered mental status  Acute respiratory failure, unspecified whether with hypoxia or hypercapnia ([place])  Acute encephalopathy    Admission/ observation were discussed with the admitting physician, patient and/or family and they are comfortable with the plan.    Final Clinical Impressions(s) / ED Diagnoses   Final diagnoses:  Altered mental status  Acute respiratory failure, unspecified whether with hypoxia or hypercapnia ([place])  Acute encephalopathy    New Prescriptions Current Discharge Medication List       Elnora Morrison, MD 07/19/16 0030

## 2016-07-18 NOTE — Progress Notes (Signed)
ABG collected  

## 2016-07-18 NOTE — Code Documentation (Addendum)
Code Stroke called at [place].  Patient was admitted via EMS from [facility].  Per EMS and ED staff, patient was experiencing rapid and adute changes in mental status and upon arrival to ED, patient was obtunded. Patient was intubated in the ED and then taken to CT at [place].  HEAD CT and CT ANGIO were negative.  Patient will have EEG monitoring and will be admitted to ICU per PCCM and Neuro.

## 2016-07-18 NOTE — H&P (Signed)
PULMONARY / CRITICAL CARE MEDICINE   Name: Mark Chang MRN: [id number] DOB: [birth year]    ADMISSION DATE:  07/18/2016  CHIEF COMPLAINT:  Altered mental status.  HISTORY OF PRESENT ILLNESS:   72 y/o man with a history of mental retardation, COPD, HTN, who presented after sudden mental status changes. No prodrome, seizure, or other activity noted. He was taken to the ED, but a CT scan (including angiogram), routine labs, sepsis workup, and initial EEG were unremarkable. Neurology was consulted and is following along. In the ED, he was intubated for airway protection. PCCM was consulted for initial management.  Brother is [phone] Mark Chang), who has been notified of his decline.  PAST MEDICAL HISTORY :  He  has a past medical history of COPD (chronic obstructive pulmonary disease) ([place]); Diabetes mellitus without complication ([place]); Hyperlipidemia; Hypertension; Mental retardation; Obesity; and Pernicious anemia.  PAST SURGICAL HISTORY: He  has no past surgical history on file.  No Known Allergies  No current facility-administered medications on file prior to encounter.    Current Outpatient Prescriptions on File Prior to Encounter  Medication Sig  . atorvastatin (LIPITOR) 20 MG tablet Take 20 mg by mouth daily.   . Cyanocobalamin (VITAMIN B 12 PO) Take 1,000 mcg by mouth daily.  Marland Kitchen docusate (COLACE) 50 MG/5ML liquid Take 10 mg by mouth daily. Into each ear.  . escitalopram (LEXAPRO) 10 MG tablet Take 10 mg by mouth daily.  . insulin detemir (LEVEMIR) 100 UNIT/ML injection Inject 33 Units into the skin every morning.   Marland Kitchen lisinopril (PRINIVIL,ZESTRIL) 40 MG tablet Take 40 mg by mouth 2 (two) times daily.   . Oxymetazoline HCl (QC NASAL RELIEF SINUS NA) Place 2 sprays into the nose every 12 (twelve) hours as needed (congestion).  . ranitidine (ZANTAC) 150 MG capsule Take 150 mg by mouth 2 (two) times daily.  . white petrolatum (VASELINE) GEL Apply 1 application topically at  bedtime. Apply to heels    FAMILY HISTORY:  His has no family status information on file.    SOCIAL HISTORY: He  does not have a smoking history on file. He has never used smokeless tobacco. He reports that he does not drink alcohol.  REVIEW OF SYSTEMS:   Unable to obtain.  SUBJECTIVE:  None  VITAL SIGNS: BP (!) 72/54   Pulse 94   Resp (!) 6   Ht 5\' 8"  (1.727 m)   SpO2 99%   HEMODYNAMICS:    VENTILATOR SETTINGS: Vent Mode: PRVC FiO2 (%):  [40 %-100 %] 40 % Set Rate:  [16 bmp-22 bmp] 22 bmp Vt Set:  [540 mL] 540 mL PEEP:  [5 cmH20] 5 cmH20 Plateau Pressure:  [21 cmH20-22 cmH20] 21 cmH20  INTAKE / OUTPUT: No intake/output data recorded.  PHYSICAL EXAMINATION: General:  Obese man, intubated, sedated. Neuro: Diminished CN reflexes, no spontaneous movement HEENT:  MMM Cardiovascular:  Normal S1, S2. Lungs:  CTAB Abdomen:  Soft, nontender Musculoskeletal:  No inflammed joints, noted Skin:  No rashes  LABS:  BMET  Recent Labs Lab 07/18/16 1844 07/18/16 1848  NA 137 137  K 4.3 4.3  CL 98* 102  CO2 23  --   BUN 19 26*  CREATININE 1.26* 1.20  GLUCOSE 341* 352*    Electrolytes  Recent Labs Lab 07/18/16 1844  CALCIUM 9.2    CBC  Recent Labs Lab 07/18/16 1844 07/18/16 1848  WBC 6.8  --   HGB 11.9* 12.2*  HCT 36.3* 36.0*  PLT 178  --  Coag's  Recent Labs Lab 07/18/16 1844  APTT 27  INR 0.99    Sepsis Markers No results for input(s): LATICACIDVEN, PROCALCITON, O2SATVEN in the last 168 hours.  ABG  Recent Labs Lab 07/18/16 2009  PHART 7.307*  PCO2ART 54.4*  PO2ART 161.0*    Liver Enzymes  Recent Labs Lab 07/18/16 1844  AST 26  ALT 20  ALKPHOS 56  BILITOT 0.5  ALBUMIN 3.3*    Cardiac Enzymes No results for input(s): TROPONINI, PROBNP in the last 168 hours.  Glucose  Recent Labs Lab 07/18/16 1848  GLUCAP 346*    Imaging Ct Angio Head W Or Wo Contrast  Result Date: 07/18/2016 CLINICAL DATA:  71 y/o  M;  code stroke altered mental status. EXAM: CT ANGIOGRAPHY HEAD AND NECK TECHNIQUE: Multidetector CT imaging of the head and neck was performed using the standard protocol during bolus administration of intravenous contrast. Multiplanar CT image reconstructions and MIPs were obtained to evaluate the vascular anatomy. Carotid stenosis measurements (when applicable) are obtained utilizing NASCET criteria, using the distal internal carotid diameter as the denominator. CONTRAST:  50 cc Isovue 370 COMPARISON:  07/18/2016 CT of the head. FINDINGS: CTA NECK FINDINGS Aortic arch: Three-vessel arch. Moderate calcific atherosclerosis of the arch and great vessel origins without high-grade stenosis. Right carotid system: No evidence of dissection, stenosis (50% or greater) or occlusion. Minimal calcific atherosclerosis of the carotid bifurcation. Left carotid system: No evidence of dissection, stenosis (50% or greater) or occlusion. Mild calcified plaque of proximal ICA with mild 30-40% stenosis. Vertebral arteries: Codominant. No evidence of dissection, stenosis (50% or greater) or occlusion. Skeleton: Mild-to-moderate cervical spondylosis greatest at the C5 through C7 levels. No high-grade bony canal stenosis or foraminal narrowing. Other neck: Negative. Endotracheal and enteric tubes extend below the field of view. Upper chest: Negative. Review of the MIP images confirms the above findings CTA HEAD FINDINGS Anterior circulation: No significant stenosis, proximal occlusion, aneurysm, or vascular malformation. Moderate calcified plaque of cavernous and paraclinoid segments with mild paraclinoid stenosis bilaterally. Posterior circulation: No significant stenosis, proximal occlusion, aneurysm, or vascular malformation. Calcified plaque of bilateral V4 segments with mild stenosis. Venous sinuses: As permitted by contrast timing, patent. Anatomic variants: Patent anterior communicating artery. Large left posterior communicating  artery and small left P1 segment compatible with persistent fetal circulation. No right posterior communicating artery identified, likely hypoplastic or absent. Review of the MIP images confirms the above findings IMPRESSION: 1. Carotid and vertebral arteries are patent. No evidence of dissection, high-grade stenosis, or occlusion. 2. Patent circle of Willis. No evidence of proximal occlusion, high-grade stenosis, aneurysm, or vascular malformation. 3. Left proximal internal carotid artery calcified plaque with mild 30-40% stenosis. 4. Bilateral cavernous/paraclinoid internal carotid arteries and the V4 segment calcified plaque with mild stenosis. These results were called by telephone at the time of interpretation on 07/18/2016 at 7:49 pm to Dr. Reather Converse, who verbally acknowledged these results. Electronically Signed   By: Kristine Garbe M.D.   On: 07/18/2016 19:53   Ct Angio Neck W Or Wo Contrast  Result Date: 07/18/2016 CLINICAL DATA:  71 y/o  M; code stroke altered mental status. EXAM: CT ANGIOGRAPHY HEAD AND NECK TECHNIQUE: Multidetector CT imaging of the head and neck was performed using the standard protocol during bolus administration of intravenous contrast. Multiplanar CT image reconstructions and MIPs were obtained to evaluate the vascular anatomy. Carotid stenosis measurements (when applicable) are obtained utilizing NASCET criteria, using the distal internal carotid diameter as the denominator. CONTRAST:  50 cc Isovue 370 COMPARISON:  07/18/2016 CT of the head. FINDINGS: CTA NECK FINDINGS Aortic arch: Three-vessel arch. Moderate calcific atherosclerosis of the arch and great vessel origins without high-grade stenosis. Right carotid system: No evidence of dissection, stenosis (50% or greater) or occlusion. Minimal calcific atherosclerosis of the carotid bifurcation. Left carotid system: No evidence of dissection, stenosis (50% or greater) or occlusion. Mild calcified plaque of proximal ICA  with mild 30-40% stenosis. Vertebral arteries: Codominant. No evidence of dissection, stenosis (50% or greater) or occlusion. Skeleton: Mild-to-moderate cervical spondylosis greatest at the C5 through C7 levels. No high-grade bony canal stenosis or foraminal narrowing. Other neck: Negative. Endotracheal and enteric tubes extend below the field of view. Upper chest: Negative. Review of the MIP images confirms the above findings CTA HEAD FINDINGS Anterior circulation: No significant stenosis, proximal occlusion, aneurysm, or vascular malformation. Moderate calcified plaque of cavernous and paraclinoid segments with mild paraclinoid stenosis bilaterally. Posterior circulation: No significant stenosis, proximal occlusion, aneurysm, or vascular malformation. Calcified plaque of bilateral V4 segments with mild stenosis. Venous sinuses: As permitted by contrast timing, patent. Anatomic variants: Patent anterior communicating artery. Large left posterior communicating artery and small left P1 segment compatible with persistent fetal circulation. No right posterior communicating artery identified, likely hypoplastic or absent. Review of the MIP images confirms the above findings IMPRESSION: 1. Carotid and vertebral arteries are patent. No evidence of dissection, high-grade stenosis, or occlusion. 2. Patent circle of Willis. No evidence of proximal occlusion, high-grade stenosis, aneurysm, or vascular malformation. 3. Left proximal internal carotid artery calcified plaque with mild 30-40% stenosis. 4. Bilateral cavernous/paraclinoid internal carotid arteries and the V4 segment calcified plaque with mild stenosis. These results were called by telephone at the time of interpretation on 07/18/2016 at 7:49 pm to Dr. Reather Converse, who verbally acknowledged these results. Electronically Signed   By: Kristine Garbe M.D.   On: 07/18/2016 19:53   Dg Chest Portable 1 View  Result Date: 07/18/2016 CLINICAL DATA:  71 y/o  M; post  intubation. EXAM: PORTABLE CHEST 1 VIEW COMPARISON:  None. FINDINGS: Endotracheal tube tip 2.6 cm from carina. Low lung volumes. Hazy opacification of lungs may represent edema or atelectasis. Normal cardiac silhouette given projection and technique. No pneumothorax. No acute osseous abnormality is evident. Enteric tube tip below the field of view abdomen. IMPRESSION: Endotracheal tube tip 2.6 cm from the carina. Enteric tube tip below the field of view and abdomen. Low lung volumes. Hazy lung may represent edema or atelectasis. Electronically Signed   By: Kristine Garbe M.D.   On: 07/18/2016 19:16   Ct Head Code Stroke Wo Contrast  Result Date: 07/18/2016 CLINICAL DATA:  Code stroke.  Altered mental status EXAM: CT HEAD WITHOUT CONTRAST TECHNIQUE: Contiguous axial images were obtained from the base of the skull through the vertex without intravenous contrast. COMPARISON:  Head CT 11/18/2014 FINDINGS: Brain: No mass lesion, intraparenchymal hemorrhage or extra-axial collection. No evidence of acute cortical infarct. There is periventricular hypoattenuation compatible with chronic microvascular disease. Vascular: Atherosclerotic calcification of the vertebral and internal carotid arteries at the skull base. Skull: Normal visualized skull base, calvarium and extracranial soft tissues. Sinuses/Orbits: No sinus fluid levels or advanced mucosal thickening. No mastoid effusion. Normal orbits. ASPECTS [facility] Stroke Program Early CT Score) - Ganglionic level infarction (caudate, lentiform nuclei, internal capsule, insula, M1-M3 cortex): 7 - Supraganglionic infarction (M4-M6 cortex): 3 Total score (0-10 with 10 being normal): 10 IMPRESSION: 1. No acute intracranial abnormality. 2. ASPECTS is 10. These results were  called by telephone at the time of interpretation on 07/18/2016 at 7:31 pm to Dr. Wallie Char, who verbally acknowledged these results. Electronically Signed   By: Ulyses Jarred M.D.   On:  07/18/2016 19:32    STUDIES:  Head CT -- normal  CULTURES: Blood x2, 4/22 - pending  ANTIBIOTICS: None  SIGNIFICANT EVENTS: Intubated in the ED.  LINES/TUBES: ETT - 8.0 PIVs OGT Foley  DISCUSSION: 71 y/o man with hx of mental retardation (unknown cause) who presented with sudden mental status changes requiring mechanical ventilation  ASSESSMENT / PLAN:  PULMONARY A: Need for mechanical ventilation Hx of COPD (needs to be verified) P:   Full vent support  CARDIOVASCULAR A:  No active issues P:   RENAL A:   Renal insuffiencey, unclear chronicity P:   Replete fluids.  GASTROINTESTINAL A:   No active issues. P:    HEMATOLOGIC A:   Anemia P:  Trend H&H  INFECTIOUS A:   Unclear picture, possible CNS infection? P:   Consider LP  ENDOCRINE A:   Hyperglycemia P:   SSI  NEUROLOGIC A:   Acute decline in metal status P:   Unclear etiology. Possible seizure activity, so cont EEG in process. Appreciate neuro input. Consider LP.   RASS goal: 0  CRITICAL CARE Performed by: Luz Brazen   Total critical care time: 50 minutes  Critical care time was exclusive of separately billable procedures and treating other patients.  Critical care was necessary to treat or prevent imminent or life-threatening deterioration.  Critical care was time spent personally by me on the following activities: development of treatment plan with patient and/or surrogate as well as nursing, discussions with consultants, evaluation of patient's response to treatment, examination of patient, obtaining history from patient or surrogate, ordering and performing treatments and interventions, ordering and review of laboratory studies, ordering and review of radiographic studies, pulse oximetry and re-evaluation of patient's condition.   FAMILY  - Updates:   - Inter-disciplinary family meet or Palliative Care meeting due by:  07/26/16   Luz Brazen, MD Pulmonary and  [place] Pager: [phone]  07/18/2016, 10:09 PM

## 2016-07-19 LAB — URINALYSIS, ROUTINE W REFLEX MICROSCOPIC
BACTERIA UA: NONE SEEN
Bilirubin Urine: NEGATIVE
Glucose, UA: NEGATIVE mg/dL
Hgb urine dipstick: NEGATIVE
Ketones, ur: NEGATIVE mg/dL
Leukocytes, UA: NEGATIVE
Nitrite: NEGATIVE
Protein, ur: 100 mg/dL — AB
RBC / HPF: NONE SEEN RBC/hpf (ref 0–5)
SQUAMOUS EPITHELIAL / LPF: NONE SEEN
Specific Gravity, Urine: 1.019 (ref 1.005–1.030)
WBC, UA: NONE SEEN WBC/hpf (ref 0–5)
pH: 5 (ref 5.0–8.0)

## 2016-07-19 LAB — MRSA PCR SCREENING: MRSA BY PCR: POSITIVE — AB

## 2016-07-19 LAB — CBC
HCT: 33.7 % — ABNORMAL LOW (ref 39.0–52.0)
HEMOGLOBIN: 10.9 g/dL — AB (ref 13.0–17.0)
MCH: 28.7 pg (ref 26.0–34.0)
MCHC: 32.3 g/dL (ref 30.0–36.0)
MCV: 88.7 fL (ref 78.0–100.0)
Platelets: 157 10*3/uL (ref 150–400)
RBC: 3.8 MIL/uL — ABNORMAL LOW (ref 4.22–5.81)
RDW: 14.1 % (ref 11.5–15.5)
WBC: 8.5 10*3/uL (ref 4.0–10.5)

## 2016-07-19 LAB — VITAMIN B12: VITAMIN B 12: 592 pg/mL (ref 180–914)

## 2016-07-19 LAB — T4, FREE: FREE T4: 0.88 ng/dL (ref 0.61–1.12)

## 2016-07-19 LAB — RAPID URINE DRUG SCREEN, HOSP PERFORMED
Amphetamines: NOT DETECTED
BENZODIAZEPINES: NOT DETECTED
Barbiturates: NOT DETECTED
COCAINE: NOT DETECTED
OPIATES: NOT DETECTED
TETRAHYDROCANNABINOL: NOT DETECTED

## 2016-07-19 LAB — GLUCOSE, CAPILLARY
GLUCOSE-CAPILLARY: 313 mg/dL — AB (ref 65–99)
GLUCOSE-CAPILLARY: 223 mg/dL — AB (ref 65–99)
GLUCOSE-CAPILLARY: 198 mg/dL — AB (ref 65–99)
Glucose-Capillary: 181 mg/dL — ABNORMAL HIGH (ref 65–99)
Glucose-Capillary: 186 mg/dL — ABNORMAL HIGH (ref 65–99)
Glucose-Capillary: 346 mg/dL — ABNORMAL HIGH (ref 65–99)

## 2016-07-19 LAB — MAGNESIUM: MAGNESIUM: 1.7 mg/dL (ref 1.7–2.4)

## 2016-07-19 LAB — PHOSPHORUS: PHOSPHORUS: 2.6 mg/dL (ref 2.5–4.6)

## 2016-07-19 LAB — TSH: TSH: 0.562 u[IU]/mL (ref 0.350–4.500)

## 2016-07-19 MED ORDER — PIPERACILLIN-TAZOBACTAM 3.375 G IVPB
3.3750 g | Freq: Three times a day (TID) | INTRAVENOUS | Status: DC
  Administered 2016-07-19 – 2016-07-23 (×11): 3.375 g via INTRAVENOUS
  Filled 2016-07-19 (×14): qty 50

## 2016-07-19 MED ORDER — VANCOMYCIN HCL IN DEXTROSE 1-5 GM/200ML-% IV SOLN: 1000.0000 mg | Freq: Two times a day (BID) | INTRAVENOUS | Status: DC

## 2016-07-19 MED ORDER — CHLORHEXIDINE GLUCONATE 0.12 % MT SOLN: 15.0000 mL | Freq: Two times a day (BID) | OROMUCOSAL | Status: DC

## 2016-07-19 MED ORDER — ORAL CARE MOUTH RINSE
15.0000 mL | Freq: Four times a day (QID) | OROMUCOSAL | Status: DC
  Administered 2016-07-20 – 2016-07-21 (×8): 15 mL via OROMUCOSAL

## 2016-07-19 MED ORDER — ACETAMINOPHEN 160 MG/5ML PO SOLN
650.0000 mg | ORAL | Status: DC | PRN
  Administered 2016-07-19 – 2016-07-20 (×2): 650 mg
  Filled 2016-07-19 (×2): qty 20.3

## 2016-07-19 MED ORDER — VANCOMYCIN HCL IN DEXTROSE 1-5 GM/200ML-% IV SOLN
1000.0000 mg | Freq: Two times a day (BID) | INTRAVENOUS | Status: DC
  Administered 2016-07-20 – 2016-07-22 (×5): 1000 mg via INTRAVENOUS
  Filled 2016-07-19 (×6): qty 200

## 2016-07-19 MED ORDER — INSULIN ASPART 100 UNIT/ML ~~LOC~~ SOLN
0.0000 [IU] | SUBCUTANEOUS | Status: DC
  Administered 2016-07-19 (×3): 2 [IU] via SUBCUTANEOUS
  Administered 2016-07-19: 3 [IU] via SUBCUTANEOUS
  Administered 2016-07-19 (×2): 7 [IU] via SUBCUTANEOUS
  Administered 2016-07-20 (×3): 2 [IU] via SUBCUTANEOUS
  Administered 2016-07-20: 3 [IU] via SUBCUTANEOUS
  Administered 2016-07-20: 5 [IU] via SUBCUTANEOUS
  Administered 2016-07-20: 2 [IU] via SUBCUTANEOUS
  Administered 2016-07-21: 5 [IU] via SUBCUTANEOUS
  Administered 2016-07-21: 3 [IU] via SUBCUTANEOUS
  Administered 2016-07-21 (×4): 5 [IU] via SUBCUTANEOUS

## 2016-07-19 MED ORDER — VANCOMYCIN HCL 10 G IV SOLR
2000.0000 mg | INTRAVENOUS | Status: AC
  Administered 2016-07-19: 2000 mg via INTRAVENOUS
  Filled 2016-07-19: qty 2000

## 2016-07-19 MED ORDER — ORAL CARE MOUTH RINSE
15.0000 mL | Freq: Two times a day (BID) | OROMUCOSAL | Status: DC
  Administered 2016-07-19: 15 mL via OROMUCOSAL

## 2016-07-19 MED ORDER — CHLORHEXIDINE GLUCONATE 0.12% ORAL RINSE (MEDLINE KIT)
15.0000 mL | Freq: Two times a day (BID) | OROMUCOSAL | Status: DC
  Administered 2016-07-19 – 2016-07-21 (×5): 15 mL via OROMUCOSAL

## 2016-07-19 MED ORDER — PIPERACILLIN-TAZOBACTAM 3.375 G IVPB
3.3750 g | Freq: Three times a day (TID) | INTRAVENOUS | Status: DC
  Filled 2016-07-19 (×2): qty 50

## 2016-07-19 NOTE — Progress Notes (Signed)
Neurology Progress Note  Subjective: Chart reviewed. Patient admitted to ICU after presenting obtunded from a nursing facility. CODE STROKE was activated by ED MD and patient was taken for STAT [facility] and CTA head/neck. These scans were unremarkable. He was intubated in the ED and has been unresponsive since presentation, etiology still unclear. No major overnight events though he did require soft limb restraints and mitts due to agitation. Continuous EEG was started; no clinical seizures have been seen. He remains intubated.   Medications reviewed and reconciled.   Pertinent meds: Fentanyl drip  Current Meds:   Current Facility-Administered Medications:  .  0.9 %  sodium chloride infusion, 250 mL, Intravenous, PRN, Luz Brazen, MD .  enoxaparin (LOVENOX) injection 40 mg, 40 mg, Subcutaneous, Q24H, Luz Brazen, MD .  etomidate (AMIDATE) injection, , Intravenous, PRN, Jenny Reichmann, MD, 20 mg at 07/18/16 1852 .  fentaNYL (SUBLIMAZE) bolus via infusion 25 mcg, 25 mcg, Intravenous, Q1H PRN, Chesley Mires, MD .  fentaNYL 2578mcg in NS 263mL (31mcg/ml) infusion-PREMIX, 25-400 mcg/hr, Intravenous, Continuous, Chesley Mires, MD, Stopped at 07/18/16 2300 .  insulin aspart (novoLOG) injection 0-9 Units, 0-9 Units, Subcutaneous, Q4H, Anders Simmonds, MD, 3 Units at 07/19/16 0848 .  MEDLINE mouth rinse, 15 mL, Mouth Rinse, BID, Anders Simmonds, MD, 15 mL at 07/19/16 0850 .  methylPREDNISolone sodium succinate (SOLU-MEDROL) 125 mg/2 mL injection 125 mg, 125 mg, Intravenous, Once, Elnora Morrison, MD .  midazolam (VERSED) injection 1 mg, 1 mg, Intravenous, Q2H PRN, Chesley Mires, MD .  pantoprazole (PROTONIX) injection 40 mg, 40 mg, Intravenous, Q24H, Anders Simmonds, MD, 40 mg at 07/19/16 0030 .  rocuronium (ZEMURON) injection, , Intravenous, PRN, Jenny Reichmann, MD, 100 mg at 07/18/16 1853  Objective:  Temp:  [98.2 F (36.8 C)-99.8 F (37.7 C)] 99.8 F (37.7 C) (04/22 0841) Pulse Rate:  [91-144] 108  (04/22 0841) Resp:  [0-29] 22 (04/22 0841) BP: (72-152)/(53-79) 121/70 (04/22 0700) SpO2:  [93 %-100 %] 99 % (04/22 0841) FiO2 (%):  [40 %-100 %] 40 % (04/22 0841) Weight:  [122.4 kg (269 lb 13.5 oz)] 122.4 kg (269 lb 13.5 oz) (04/22 0401)  General: WD obese Caucasian man lying in ICU bed. He is intubated. His fentanyl drip was paused at 0741. Continuous EEG running. He does not open his eyes either spontaneously or with stimulation. He does not follow any commands.  HEENT: Neck is supple without lymphadenopathy. EEG electrodes in place, secured under wrap. ETT and OGT in place. Sclerae are anicteric. There is no conjunctival injection.  CV: Distant, regular, no obvious murmur. Carotid pulses are 2+ and symmetric with no bruits. Distal pulses 2+ and symmetric.  Lungs: CTAB on anterior exam. Ventilated.  Neuro: MS: As noted above.  CN: Pupils are equal and reactive from 3-->2 mm bilaterally. His eyes are intermittently dysconjugated. He does not clearly blink to visual threat. Oculocephalics are intact. Corneals are intact and symmetric. His face appears grossly symmetric but is partly obscured by tubes and tape. The remainder of his cranial nerve cannot be assessed as he is not able to participate with the exam.  Motor: Normal bulk. Tone is reduced diffusely. No spontaneous movement of the limbs observed. No tremor or other abnormal movements are observed.  Sensation: He has purposeful withdrawal to noxious stimulation, legs better than arms. He grimaces to supraorbital pressure bilaterally.  DTRs: Brisk 2+, symmetric. Toes are upgoing bilaterally. No pathological reflexes.  Coordination/gait: These cannot be assessed as the patient is unable to participate  with the exam.   Labs: Lab Results  Component Value Date   WBC 8.5 07/19/2016   HGB 10.9 (L) 07/19/2016   HCT 33.7 (L) 07/19/2016   PLT 157 07/19/2016   GLUCOSE 352 (H) 07/18/2016   ALT 20 07/18/2016   AST 26 07/18/2016   NA 137  07/18/2016   K 4.3 07/18/2016   CL 102 07/18/2016   CREATININE 1.20 07/18/2016   BUN 26 (H) 07/18/2016   CO2 23 07/18/2016   INR 0.99 07/18/2016   CBC Latest Ref Rng & Units 07/19/2016 07/18/2016 07/18/2016  WBC 4.0 - 10.5 K/uL 8.5 - 6.8  Hemoglobin 13.0 - 17.0 g/dL 10.9(L) 12.2(L) 11.9(L)  Hematocrit 39.0 - 52.0 % 33.7(L) 36.0(L) 36.3(L)  Platelets 150 - 400 K/uL 157 - 178    No results found for: HGBA1C Lab Results  Component Value Date   ALT 20 07/18/2016   AST 26 07/18/2016   ALKPHOS 56 07/18/2016   BILITOT 0.5 07/18/2016   Blood cx pending  UA not sent Urine drug screen not sent  Radiology:  I have personally and independently reviewed the [facility] without contrast from last night. This shows no obvious acute abnormality. There are scattered patchy areas of hypodensity involving the bihemispheric white matter most consistent with chronic small vessel ischemic disease.   I have personally and independently reviewed the CTA of the head and neck from last night. This shows no acute abnormality. Mild scattered atherosclerotic changes are noted diffusely.   Other diagnostic studies:  I have reviewed the continuous EEG in real time at the bedside. There is mild to moderate diffuse generalized slowing. The slowing is relatively greater on the left where the background is not as well maintained. No epileptiform discharges or seizures are noted.   A/P:  1. Acute encephalopathy: The etiology remains unclear. No evidence of stroke or hemorrhage on initial CTH in ED with no large vessel occlusion on CTA head/neck. Consider repeat MRI brain vs repeat CTH without contrast to further assess for ischemia as initial scan may have been done too early for this to be seen. CNS infection is possible but he has no stiff neck, fever, or leukocytosis to suggest meningoencephalitis. EEG has not shown anything to support seizures, will continue for now but if remains negative would d/c in AM. Labs thus far  unrevealing. Will check TSH, free T4, B12, mg, phos. Will reorder UA, urine drug screen as it does not appear these have been sent yet. Continue supportive care. Optimize metabolic status, limit sedation as tolerated. He has a history of MR indicated reduced cerebral reserve at baseline. This predisposes him to encephalopathy from all causes and could predict delayed recovery from same.   Melba Coon, MD [facility]

## 2016-07-19 NOTE — Progress Notes (Signed)
Pharmacy Antibiotic Note  Mark Chang is a 71 y.o. male admitted on 07/18/2016 with pneumonia.  Pharmacy has been consulted for Vancomycin and Zosyn dosing.  Plan: Zosyn 3.375gm IV q8h - doses over 4 hours Vancomycin 2gm IV now then 1gm IV q12h Will f/u micro data, renal function, and pt's clinical condition Vanc trough prn   Height: 5\' 8"  (172.7 cm) Weight: 269 lb 13.5 oz (122.4 kg) IBW/kg (Calculated) : 68.4  Temp (24hrs), Avg:100 F (37.8 C), Min:98.2 F (36.8 C), Max:101.3 F (38.5 C)   Recent Labs Lab 07/18/16 1844 07/18/16 1848 07/19/16 0549  WBC 6.8  --  8.5  CREATININE 1.26* 1.20  --     Estimated Creatinine Clearance: 71.9 mL/min (by C-G formula based on SCr of 1.2 mg/dL).    No Known Allergies  Antimicrobials this admission: 4/22 Vanc >>  4/22 Zosyn >>   Dose adjustments this admission: n/a  Microbiology results: 4/22 BCx x2:  4/21 MRSA PCR: positive  Thank you for allowing pharmacy to be a part of this patient's care.  Sherlon Handing, PharmD, BCPS Clinical pharmacist, pager [phone] 07/19/2016 9:54 PM

## 2016-07-19 NOTE — Progress Notes (Signed)
RN notified of pt temp of 101 F orally

## 2016-07-19 NOTE — Progress Notes (Signed)
[place] Progress Note Patient Name: Mark Chang DOB: 01-30-1946 MRN: [id number]   Date of Service  07/19/2016  HPI/Events of Note  Spiked to 101.3 with hazy changes in bases on cxr likely hcap/asp  eICU Interventions  Zosyn/ vanc     Intervention Category Major Interventions: Infection - evaluation and management  Christinia Gully 07/19/2016, 9:44 PM

## 2016-07-19 NOTE — Procedures (Signed)
Electroencephalogram report- LTM  Ordering Physician : Dr. Nicole Kindred EEG number: [phone]    Beginning date or time: 07/18/2016 2257 hours Ending date or time: 07/19/2016 1000 hours  Day of study: day 1  Medications include: Per EMR  MENTAL STATUS (per technician's notes): Intubated, sedated.  HISTORY: This 24 hours of intensive EEG monitoring with simultaneous video monitoring was performed for this patient with  altered mental status. This EEG was requested to rule out subclinical electrographic seizures.  TECHNICAL DESCRIPTION:  The study consists of a continuous 16-channel multi-montage digital video EEG recording with twenty-one electrodes placed according to the International 10-20 System. Additional leads included eye leads, true temporal leads (T1, T2), and an EKG lead. Activation procedures were not done due to mental status.  REPORT: The background activity in this tracing consisted of polymorphic delta and theta background. The activity seemed to be somewhat reactive to tactile stimuli. Sleep architecture in the form of sleep spindles were seen. No focal slowing or epileptiform activity was identified.   IMPRESSION: This is an abnormal EEG due to diffuse slowing.  CLINICAL CORRELATION: This EEG is consistent with diffuse non-specific cerebral dysfunction, at least in part due to sedating medications. . No electrographic seizures were seen to explain the patient's altered mental status.Marland Kitchen

## 2016-07-19 NOTE — Progress Notes (Signed)
[place] Progress Note Patient Name: Mark Chang DOB: [birth year] MRN: [id number]   Date of Service  07/19/2016  HPI/Events of Note  Hyperglycemia - Blood glucose = 346.  eICU Interventions  Will order: 1. Q 4 hour sensitive Novolog SSI.      Intervention Category Major Interventions: Hyperglycemia - active titration of insulin therapy  Lysle Dingwall 07/19/2016, 12:55 AM

## 2016-07-19 NOTE — Progress Notes (Signed)
LTM day 1 started; Dr Candiss Norse notified.

## 2016-07-20 ENCOUNTER — Inpatient Hospital Stay (HOSPITAL_COMMUNITY): Payer: Medicare Other

## 2016-07-20 DIAGNOSIS — J9601 Acute respiratory failure with hypoxia: Secondary | ICD-10-CM

## 2016-07-20 DIAGNOSIS — J9600 Acute respiratory failure, unspecified whether with hypoxia or hypercapnia: Secondary | ICD-10-CM

## 2016-07-20 LAB — GLUCOSE, CAPILLARY
GLUCOSE-CAPILLARY: 186 mg/dL — AB (ref 65–99)
GLUCOSE-CAPILLARY: 170 mg/dL — AB (ref 65–99)
GLUCOSE-CAPILLARY: 213 mg/dL — AB (ref 65–99)
GLUCOSE-CAPILLARY: 256 mg/dL — AB (ref 65–99)
Glucose-Capillary: 173 mg/dL — ABNORMAL HIGH (ref 65–99)
Glucose-Capillary: 174 mg/dL — ABNORMAL HIGH (ref 65–99)

## 2016-07-20 LAB — BASIC METABOLIC PANEL
ANION GAP: 13 (ref 5–15)
BUN: 12 mg/dL (ref 6–20)
CALCIUM: 8.7 mg/dL — AB (ref 8.9–10.3)
CO2: 25 mmol/L (ref 22–32)
Chloride: 104 mmol/L (ref 101–111)
Creatinine, Ser: 1.08 mg/dL (ref 0.61–1.24)
GFR calc Af Amer: >60 mL/min (ref >60)
Glucose, Bld: 220 mg/dL — ABNORMAL HIGH (ref 65–99)
Potassium: 3.6 mmol/L (ref 3.5–5.1)
Sodium: 142 mmol/L (ref 135–145)

## 2016-07-20 LAB — CBC
HCT: 39.4 % (ref 39.0–52.0)
HEMOGLOBIN: 12.6 g/dL — AB (ref 13.0–17.0)
MCH: 29 pg (ref 26.0–34.0)
MCHC: 32 g/dL (ref 30.0–36.0)
MCV: 90.8 fL (ref 78.0–100.0)
PLATELETS: 170 10*3/uL (ref 150–400)
RBC: 4.34 MIL/uL (ref 4.22–5.81)
RDW: 14.1 % (ref 11.5–15.5)
WBC: 9.5 10*3/uL (ref 4.0–10.5)

## 2016-07-20 LAB — PROCALCITONIN

## 2016-07-20 MED ORDER — MUPIROCIN 2 % EX OINT
TOPICAL_OINTMENT | Freq: Two times a day (BID) | CUTANEOUS | Status: DC
  Administered 2016-07-20: 10:00:00 via NASAL
  Administered 2016-07-20: 1 via NASAL
  Administered 2016-07-21 – 2016-07-23 (×5): via NASAL
  Filled 2016-07-20 (×2): qty 22

## 2016-07-20 MED ORDER — METHYLPREDNISOLONE SODIUM SUCC 40 MG IJ SOLR
40.0000 mg | Freq: Three times a day (TID) | INTRAMUSCULAR | Status: AC
  Administered 2016-07-20 – 2016-07-21 (×3): 40 mg via INTRAVENOUS
  Filled 2016-07-20 (×3): qty 1

## 2016-07-20 NOTE — Progress Notes (Signed)
Subjective: Following commands.   Exam: Vitals:   07/20/16 0600 07/20/16 0736  BP: 134/71   Pulse: 92 89  Resp: (!) 22 (!) 22  Temp:     Gen: In bed, intubated Resp: ventilated Abd: soft, nt  Neuro: MS: awake, alert, follows commands to lift hands, wiggle toes briskly bilaterally.  VN:[id number] seeing fingers wiggle in both hemifields. Keeps eyes tightly closed, but pupils are at least close to equal, difficult to visualize them at the same time. He fixates and tracks across midline in both directions.  Motor: moves all extremities to command.  Sensory:endorses sensation x 4.   Pertinent Labs: TSH - nml Ammonia 50 Elevated BG b12 592 Cr 1.2(4/21) UDS - negative  Impression: Acute AMS of unclear etiology. Certainly his improvement is encouraging. No evidence of seizure on initial 24 hour EEG, I would not favor empiric AEDs based on history at this point as not clear presenting event was seizure. I think further evaluation once extubated would be helpful.   Recommendations: 1) Can d/c EEG, formal read to follow.  2) MRI brain  3) re-eval once extubated.   Roland Rack, MD [facility] [phone]  If 7pm- 7am, please page neurology on call as listed in [place].

## 2016-07-20 NOTE — Progress Notes (Signed)
I spoke with his brother, [name], who described the events preceding his hospitalization. He was eating a meal when he tried to get out of a chair but stumbled in doing so. Upon sitting back, he was incoherent and confused.   On a good day, his brother is very talkative through has difficulty walking. He is otherwise functional amidst his cognitive deficits.  I reviewed with him the current medical course of his brother and the largely reassuring workup though he is now spiking fevers. He is on empiric antibiotic therapy.  He appreciated my time and told me to keep in touch. He lives 1 hour away in [place], [place] and could easily come in to the hospital if necessary.

## 2016-07-20 NOTE — Progress Notes (Signed)
LTM discontinued. Small amount of skin breakdown on pts forehead at Fp1. Dr Candiss Norse notified of takedown.

## 2016-07-20 NOTE — Progress Notes (Signed)
Patient from [facility] in [place].    Lorrine Kin, MSW, LCSW [facility] ED/48M Clinical Social Worker [phone]

## 2016-07-20 NOTE — Progress Notes (Signed)
PULMONARY  / CRITICAL CARE MEDICINE  Name: Mark Chang MRN: [id number] DOB: [birth year]    LOS: 2  REFERRING MD :  ED  CHIEF COMPLAINT:  Altered mental status  HISTORY OF PRESENT ILLNESS:   71 y/o man with a history of mental retardation, COPD, HTN, who presented after sudden mental status changes. No prodrome, seizure, or other activity noted. He was taken to the ED, but a CT scan (including angiogram), routine labs, sepsis workup, and initial EEG were unremarkable. Neurology was consulted and is following along. In the ED, he was intubated for airway protection. PCCM was consulted for initial management.  Brother is [phone] Ashtan Girtman), who has been notified of his decline.  INTERVAL HISTORY: This morning, he is alert and following commands.   PAST MEDICAL HISTORY :  Past Medical History:  Diagnosis Date  . COPD (chronic obstructive pulmonary disease) ([place])   . Diabetes mellitus without complication ([place])   . Hyperlipidemia   . Hypertension   . Mental retardation   . Obesity   . Pernicious anemia    History reviewed. No pertinent surgical history. Prior to Admission medications   Medication Sig Start Date End Date Taking? Authorizing Provider  acetaminophen (TYLENOL) 500 MG tablet Take 500 mg by mouth 3 (three) times daily as needed for moderate pain.   Yes Historical Provider, MD  atorvastatin (LIPITOR) 20 MG tablet Take 20 mg by mouth daily.    Yes Historical Provider, MD  Cyanocobalamin (VITAMIN B 12 PO) Take 1,000 mcg by mouth daily.   Yes Historical Provider, MD  docusate (COLACE) 50 MG/5ML liquid Take 10 mg by mouth daily. Into each ear.   Yes Historical Provider, MD  docusate sodium (COLACE) 100 MG capsule Take 100 mg by mouth 2 (two) times daily.   Yes Historical Provider, MD  escitalopram (LEXAPRO) 10 MG tablet Take 10 mg by mouth daily.   Yes Historical Provider, MD  furosemide (LASIX) 40 MG tablet Take 40 mg by mouth daily.   Yes Historical Provider, MD   insulin detemir (LEVEMIR) 100 UNIT/ML injection Inject 33 Units into the skin every morning.    Yes Historical Provider, MD  lisinopril (PRINIVIL,ZESTRIL) 40 MG tablet Take 40 mg by mouth 2 (two) times daily.    Yes Historical Provider, MD  Melatonin 5 MG CAPS Take 5 mg by mouth at bedtime.   Yes Historical Provider, MD  metFORMIN (GLUCOPHAGE-XR) 500 MG 24 hr tablet Take 1,000 mg by mouth 2 (two) times daily.   Yes Historical Provider, MD  omega-3 acid ethyl esters (LOVAZA) 1 g capsule Take 1 g by mouth 2 (two) times daily.   Yes Historical Provider, MD  Oxymetazoline HCl (QC NASAL RELIEF SINUS NA) Place 2 sprays into the nose every 12 (twelve) hours as needed (congestion).   Yes Historical Provider, MD  potassium chloride SA (K-DUR,KLOR-CON) 20 MEQ tablet Take 20 mEq by mouth daily.   Yes Historical Provider, MD  ranitidine (ZANTAC) 150 MG capsule Take 150 mg by mouth 2 (two) times daily.   Yes Historical Provider, MD  white petrolatum (VASELINE) GEL Apply 1 application topically at bedtime. Apply to heels   Yes Historical Provider, MD   No Known Allergies  FAMILY HISTORY:  No family history on file. SOCIAL HISTORY:  does not have a smoking history on file. He has never used smokeless tobacco. He reports that he does not drink alcohol. His drug history is not on file.   VITAL SIGNS: Temp:  [98.9  F (37.2 C)-101.3 F (38.5 C)] 98.9 F (37.2 C) (04/23 0340) Pulse Rate:  [79-121] 89 (04/23 0736) Resp:  [20-26] 22 (04/23 0736) BP: (113-155)/(61-99) 134/71 (04/23 0600) SpO2:  [93 %-100 %] 100 % (04/23 0740) FiO2 (%):  [40 %] 40 % (04/23 0740) Weight:  [269 lb 13.5 oz (122.4 kg)] 269 lb 13.5 oz (122.4 kg) (04/23 0356) HEMODYNAMICS:   VENTILATOR SETTINGS: Vent Mode: CPAP;PSV FiO2 (%):  [40 %] 40 % Set Rate:  [22 bmp] 22 bmp Vt Set:  [540 mL] 540 mL PEEP:  [5 cmH20] 5 cmH20 Pressure Support:  [10 cmH20] 10 cmH20 Plateau Pressure:  [12 cmH20-19 cmH20] 19 cmH20 INTAKE /  OUTPUT: Intake/Output      04/22 0701 - 04/23 0700 04/23 0701 - 04/24 0700   I.V. (mL/kg) 304 (2.5)    IV Piggyback 600    Total Intake(mL/kg) 904 (7.4)    Urine (mL/kg/hr) 1505 (0.5)    Emesis/NG output 1000 (0.3)    Total Output 2505     Net -1601            Physical Exam  Constitutional: No distress.  HENT:  ETT in place  Eyes:  Dysconjugate gaze  Cardiovascular: Normal rate and regular rhythm.   Pulmonary/Chest: Effort normal. No respiratory distress.  Abdominal: Soft. He exhibits no distension.  Neurological: He is alert.  Wiggles toes on command  Skin: He is not diaphoretic.     LABS: Cbc  Recent Labs Lab Jul 24, 2016 1844 07/24/2016 1848 07/19/16 0549  WBC 6.8  --  8.5  HGB 11.9* 12.2* 10.9*  HCT 36.3* 36.0* 33.7*  PLT 178  --  157    Chemistry   Recent Labs Lab 07/24/16 1844 07/24/2016 1848 07/19/16 1000  NA 137 137  --   K 4.3 4.3  --   CL 98* 102  --   CO2 23  --   --   BUN 19 26*  --   CREATININE 1.26* 1.20  --   CALCIUM 9.2  --   --   MG  --   --  1.7  PHOS  --   --  2.6  GLUCOSE 341* 352*  --     Liver fxn  Recent Labs Lab 07-24-2016 1844  AST 26  ALT 20  ALKPHOS 56  BILITOT 0.5  PROT 6.4*  ALBUMIN 3.3*   coags  Recent Labs Lab 2016-07-24 1844  APTT 27  INR 0.99   Sepsis markers No results for input(s): LATICACIDVEN, PROCALCITON in the last 168 hours. Cardiac markers No results for input(s): CKTOTAL, CKMB, TROPONINI in the last 168 hours. BNP No results for input(s): PROBNP in the last 168 hours. ABG  Recent Labs Lab 2016-07-24 1848 07-24-2016 2009 2016-07-24 2210  PHART  --  7.307* 7.383  PCO2ART  --  54.4* 42.4  PO2ART  --  161.0* 79.0*  HCO3  --  27.2 25.3  TCO2 27 29 27     CBG trend  Recent Labs Lab 07/19/16 1209 07/19/16 1608 07/19/16 1959 07/20/16 0010 07/20/16 0338  GLUCAP 186* 181* 198* 174* 186*   STUDIES:  2022-07-25 Head CT -- normal 25-Jul-2022 Head & neck CT w/ contrast with left proximal internal carotid  artery calcified plaque with mild 30-40% stenosis.  CULTURES: 4/22 Blood  ANTIBIOTICS: 4/22 Vanc/Zosyn  SIGNIFICANT EVENTS: 4/22 Intubated in the ED.  LINES/TUBES: 4/22 PIVs 4/22 OGT 4/22 Foley   DIAGNOSES: Active Problems:   Encephalopathy   ASSESSMENT / PLAN:  PULMONARY  A: Need for mechanical ventilation Hx of COPD: no means to verify P:   Full vent support with possible wean today   CARDIOVASCULAR A:  No active issues P:  Continue assessing   RENAL A:   Elevated creatinine P:   Follow BMET   GASTROINTESTINAL A:   No active issues. P:   Continue assessing   HEMATOLOGIC A:   Normocytic anemia P:  Trend H&H   INFECTIOUS A:   ?Aspiration PNA: Overnight fever, Cultures without growth this AM. P:   Follow PCT to determine abx duration   ENDOCRINE A:   Hyperglycemia P:   SSI   NEUROLOGIC A:   Acute decline in metal status: EEG and neuroimaging reassuring. Workup thus far without any overt causes. P:   F/U brain MRI RASS goal: 0   Charlott Rakes, PGY3 Internal Medicine Pager: [phone]  07/20/2016, 7:54 AM

## 2016-07-21 ENCOUNTER — Inpatient Hospital Stay (HOSPITAL_COMMUNITY): Payer: Medicare Other

## 2016-07-21 LAB — GLUCOSE, CAPILLARY
GLUCOSE-CAPILLARY: 251 mg/dL — AB (ref 65–99)
GLUCOSE-CAPILLARY: 266 mg/dL — AB (ref 65–99)
GLUCOSE-CAPILLARY: 271 mg/dL — AB (ref 65–99)
Glucose-Capillary: 250 mg/dL — ABNORMAL HIGH (ref 65–99)
Glucose-Capillary: 263 mg/dL — ABNORMAL HIGH (ref 65–99)
Glucose-Capillary: 278 mg/dL — ABNORMAL HIGH (ref 65–99)

## 2016-07-21 LAB — PROCALCITONIN: Procalcitonin: 0.14 ng/mL

## 2016-07-21 LAB — BASIC METABOLIC PANEL
Anion gap: 8 (ref 5–15)
BUN: 13 mg/dL (ref 6–20)
CHLORIDE: 108 mmol/L (ref 101–111)
CO2: 25 mmol/L (ref 22–32)
Calcium: 8.7 mg/dL — ABNORMAL LOW (ref 8.9–10.3)
Creatinine, Ser: 1.2 mg/dL (ref 0.61–1.24)
GFR, EST NON AFRICAN AMERICAN: 59 mL/min — AB (ref >60)
Glucose, Bld: 265 mg/dL — ABNORMAL HIGH (ref 65–99)
POTASSIUM: 3.9 mmol/L (ref 3.5–5.1)
SODIUM: 141 mmol/L (ref 135–145)

## 2016-07-21 LAB — CBC
HCT: 35.7 % — ABNORMAL LOW (ref 39.0–52.0)
HEMOGLOBIN: 11.4 g/dL — AB (ref 13.0–17.0)
MCH: 28.5 pg (ref 26.0–34.0)
MCHC: 31.9 g/dL (ref 30.0–36.0)
MCV: 89.3 fL (ref 78.0–100.0)
Platelets: 167 10*3/uL (ref 150–400)
RBC: 4 MIL/uL — ABNORMAL LOW (ref 4.22–5.81)
RDW: 13.7 % (ref 11.5–15.5)
WBC: 9.3 10*3/uL (ref 4.0–10.5)

## 2016-07-21 MED ORDER — ORAL CARE MOUTH RINSE
15.0000 mL | Freq: Two times a day (BID) | OROMUCOSAL | Status: DC
  Administered 2016-07-21 – 2016-07-23 (×3): 15 mL via OROMUCOSAL

## 2016-07-21 MED ORDER — INSULIN ASPART 100 UNIT/ML ~~LOC~~ SOLN
0.0000 [IU] | SUBCUTANEOUS | Status: DC
  Administered 2016-07-22 (×2): 5 [IU] via SUBCUTANEOUS
  Administered 2016-07-22: 3 [IU] via SUBCUTANEOUS
  Administered 2016-07-22: 5 [IU] via SUBCUTANEOUS
  Administered 2016-07-22 – 2016-07-23 (×3): 3 [IU] via SUBCUTANEOUS
  Administered 2016-07-23: 8 [IU] via SUBCUTANEOUS
  Administered 2016-07-23: 5 [IU] via SUBCUTANEOUS

## 2016-07-21 NOTE — Progress Notes (Signed)
Subjective: Extubated earlier today, now awake, alert, interactive  He remembers the event that brought him here, states that he was lightheaded prior to losing consciousness.  Exam: Vitals:   07/21/16 1600 07/21/16 1700  BP: 120/63 134/71  Pulse: 71 76  Resp: 13 14  Temp:     Gen: In bed, intubated Resp: ventilated Abd: soft, nt  Neuro: MS: awake, alert, Does not give month or year, but I am not certain if he is able to do this at baseline. CH:[id number] seeing fingers wiggle in both hemifields. Keeps eyes tightly closed, but pupils are at least close to equal, difficult to visualize them at the same time. He fixates and tracks across midline in both directions.  Motor: moves all extremities to command.  Sensory:endorses sensation x 4.   Pertinent Labs: TSH - nml Ammonia 50 Elevated BG b12 592 Cr 1.2(4/21) UDS - negative  Impression: Acute AMS of unclear etiology. Certainly his improvement is encouraging. No evidence of seizure on initial 24 hour EEG, I would not favor empiric AEDs based on history at this point as not clear presenting event was seizure.   Recommendations: 1) if able to obtain MRI, this could be helpful but if family reports that his back to baseline then I think this could be  Unnecessary.    Roland Rack, MD [facility] [phone]  If 7pm- 7am, please page neurology on call as listed in [place].

## 2016-07-21 NOTE — Progress Notes (Signed)
PULMONARY  / CRITICAL CARE MEDICINE  Name: Mark Chang MRN: [id number] DOB: 05-19-1945    LOS: 72  REFERRING MD :  ED  CHIEF COMPLAINT:  Altered mental status  HISTORY OF PRESENT ILLNESS:   71 y/o man with a history of mental retardation, COPD, HTN, who presented after sudden mental status changes. No prodrome, seizure, or other activity noted. He was taken to the ED, but a CT scan (including angiogram), routine labs, sepsis workup, and initial EEG were unremarkable. Neurology was consulted and is following along. In the ED, he was intubated for airway protection. PCCM was consulted for initial management.  Brother is [phone] Renso Swett), who has been notified of his decline.  INTERVAL HISTORY: He was not extubated yesterday as he did not have a cuff leak. This morning, he acknowledged no pain or discomfort.    PAST MEDICAL HISTORY :  Past Medical History:  Diagnosis Date  . COPD (chronic obstructive pulmonary disease) ([place])   . Diabetes mellitus without complication ([place])   . Hyperlipidemia   . Hypertension   . Mental retardation   . Obesity   . Pernicious anemia    History reviewed. No pertinent surgical history. Prior to Admission medications   Medication Sig Start Date End Date Taking? Authorizing Provider  acetaminophen (TYLENOL) 500 MG tablet Take 500 mg by mouth 3 (three) times daily as needed for moderate pain.   Yes Historical Provider, MD  atorvastatin (LIPITOR) 20 MG tablet Take 20 mg by mouth daily.    Yes Historical Provider, MD  Cyanocobalamin (VITAMIN B 12 PO) Take 1,000 mcg by mouth daily.   Yes Historical Provider, MD  docusate (COLACE) 50 MG/5ML liquid Take 10 mg by mouth daily. Into each ear.   Yes Historical Provider, MD  docusate sodium (COLACE) 100 MG capsule Take 100 mg by mouth 2 (two) times daily.   Yes Historical Provider, MD  escitalopram (LEXAPRO) 10 MG tablet Take 10 mg by mouth daily.   Yes Historical Provider, MD  furosemide (LASIX) 40 MG  tablet Take 40 mg by mouth daily.   Yes Historical Provider, MD  insulin detemir (LEVEMIR) 100 UNIT/ML injection Inject 33 Units into the skin every morning.    Yes Historical Provider, MD  lisinopril (PRINIVIL,ZESTRIL) 40 MG tablet Take 40 mg by mouth 2 (two) times daily.    Yes Historical Provider, MD  Melatonin 5 MG CAPS Take 5 mg by mouth at bedtime.   Yes Historical Provider, MD  metFORMIN (GLUCOPHAGE-XR) 500 MG 24 hr tablet Take 1,000 mg by mouth 2 (two) times daily.   Yes Historical Provider, MD  omega-3 acid ethyl esters (LOVAZA) 1 g capsule Take 1 g by mouth 2 (two) times daily.   Yes Historical Provider, MD  Oxymetazoline HCl (QC NASAL RELIEF SINUS NA) Place 2 sprays into the nose every 12 (twelve) hours as needed (congestion).   Yes Historical Provider, MD  potassium chloride SA (K-DUR,KLOR-CON) 20 MEQ tablet Take 20 mEq by mouth daily.   Yes Historical Provider, MD  ranitidine (ZANTAC) 150 MG capsule Take 150 mg by mouth 2 (two) times daily.   Yes Historical Provider, MD  white petrolatum (VASELINE) GEL Apply 1 application topically at bedtime. Apply to heels   Yes Historical Provider, MD   No Known Allergies  FAMILY HISTORY:  No family history on file. SOCIAL HISTORY:  does not have a smoking history on file. He has never used smokeless tobacco. He reports that he does not drink alcohol.  His drug history is not on file.   VITAL SIGNS: Temp:  [98.1 F (36.7 C)-102.2 F (39 C)] 98.1 F (36.7 C) (04/24 0826) Pulse Rate:  [60-123] 88 (04/24 0800) Resp:  [0-28] 20 (04/24 0800) BP: (94-151)/(54-95) 128/66 (04/24 0800) SpO2:  [96 %-100 %] 96 % (04/24 0800) FiO2 (%):  [40 %] 40 % (04/24 0314) Weight:  [122.4 kg (269 lb 13.5 oz)] 122.4 kg (269 lb 13.5 oz) (04/24 0411) HEMODYNAMICS:   VENTILATOR SETTINGS: Vent Mode: PRVC FiO2 (%):  [40 %] 40 % Set Rate:  [22 bmp] 22 bmp Vt Set:  [540 mL] 540 mL PEEP:  [5 cmH20] 5 cmH20 Pressure Support:  [8 cmH20] 8 cmH20 Plateau Pressure:   [17 cmH20-18 cmH20] 18 cmH20 INTAKE / OUTPUT: Intake/Output      04/23 0701 - 04/24 0700 04/24 0701 - 04/25 0700   I.V. (mL/kg) 313.8 (2.6)    IV Piggyback 550    Total Intake(mL/kg) 863.8 (7.1)    Urine (mL/kg/hr) 1050 (0.4)    Emesis/NG output 500 (0.2)    Total Output 1550     Net -686.3            Physical Exam  Constitutional: No distress.  HENT:  ETT in place  Eyes:  Dysconjugate gaze  Cardiovascular: Normal rate and regular rhythm.   Pulmonary/Chest: Effort normal. No respiratory distress.  Abdominal: Soft. He exhibits no distension.  Neurological: He is alert.  Wiggles toes, opens/closes eyes on command  Skin: He is not diaphoretic.     LABS: Cbc  Recent Labs Lab 07/19/16 0549 07/20/16 1136 07/21/16 0301  WBC 8.5 9.5 9.3  HGB 10.9* 12.6* 11.4*  HCT 33.7* 39.4 35.7*  PLT 157 170 167    Chemistry   Recent Labs Lab 07/26/2016 1844 2016-07-26 1848 07/19/16 1000 07/20/16 1136 07/21/16 0301  NA 137 137  --  142 141  K 4.3 4.3  --  3.6 3.9  CL 98* 102  --  104 108  CO2 23  --   --  25 25  BUN 19 26*  --  12 13  CREATININE 1.26* 1.20  --  1.08 1.20  CALCIUM 9.2  --   --  8.7* 8.7*  MG  --   --  1.7  --   --   PHOS  --   --  2.6  --   --   GLUCOSE 341* 352*  --  220* 265*    Liver fxn  Recent Labs Lab 2016-07-26 1844  AST 26  ALT 20  ALKPHOS 56  BILITOT 0.5  PROT 6.4*  ALBUMIN 3.3*   coags  Recent Labs Lab 07-26-2016 1844  APTT 27  INR 0.99   Sepsis markers  Recent Labs Lab 07/20/16 1136 07/21/16 0301  PROCALCITON <0.10 0.14   Cardiac markers No results for input(s): CKTOTAL, CKMB, TROPONINI in the last 168 hours. BNP No results for input(s): PROBNP in the last 168 hours. ABG  Recent Labs Lab Jul 26, 2016 1848 07/26/2016 2009 07-26-2016 2210  PHART  --  7.307* 7.383  PCO2ART  --  54.4* 42.4  PO2ART  --  161.0* 79.0*  HCO3  --  27.2 25.3  TCO2 27 29 27     CBG trend  Recent Labs Lab 07/20/16 1606 07/20/16 2105  07/21/16 0019 07/21/16 0359 07/21/16 0824  GLUCAP 173* 256* 278* 250* 251*   STUDIES:  Jul 27, 2022 Head CT -- normal Jul 27, 2022 Head & neck CT w/ contrast with left proximal internal  carotid artery calcified plaque with mild 30-40% stenosis.  CULTURES: 4/22 Blood  ANTIBIOTICS: 4/22 Vanc/Zosyn  SIGNIFICANT EVENTS: 4/22 Intubated in the ED.  LINES/TUBES: 4/22 PIVs 4/22 OGT 4/22 Foley   DIAGNOSES: Active Problems:   Encephalopathy   Acute respiratory failure (HCC)   ASSESSMENT / PLAN:  PULMONARY A: Need for mechanical ventilation Hx of COPD: no means to verify P:   Full vent support with possible wean today.  Continue Solumedrol 40mg  every 8 hours   CARDIOVASCULAR A:  No active issues P:  Continue assessing   RENAL A:   Elevated creatinine P:   Follow BMET   GASTROINTESTINAL A:   No active issues. P:   Continue assessing   HEMATOLOGIC A:   Normocytic anemia P:  Trend H&H   INFECTIOUS A:   ?Aspiration PNA: Overnight fever, Cultures without growth but febrile to 102F last night. P:   Follow PCT to determine abx duration Continue Vanc/Zosyn   ENDOCRINE A:   Hyperglycemia P:   SSI   NEUROLOGIC A:   Acute encephalopathy: EEG and neuroimaging reassuring. Brain MRI pending. P:   F/U brain MRI RASS goal: 0   Charlott Rakes, PGY3 Internal Medicine Pager: [phone]  07/21/2016, 8:27 AM

## 2016-07-21 NOTE — Procedures (Signed)
Electroencephalogram report- LTM  Ordering Physician : Dr. Nicole Kindred EEG number: [phone]    Beginning date or time: 07/19/2016 1000 hours Ending date or time: 07/20/2016 0900 hours  Day of study: day 2  Medications include: Per EMR  MENTAL STATUS (per technician's notes): Intubated, sedated.  HISTORY: This 24 hours of intensive EEG monitoring with simultaneous video monitoring was performed for this patient with  altered mental status. This EEG was requested to rule out subclinical electrographic seizures.  TECHNICAL DESCRIPTION:  The study consists of a continuous 16-channel multi-montage digital video EEG recording with twenty-one electrodes placed according to the International 10-20 System. Additional leads included eye leads, true temporal leads (T1, T2), and an EKG lead. Activation procedures were not done due to mental status.  REPORT: The background activity in this tracing consisted of polymorphic 6-7Hz  theta background, with intermixed faster frequencies. The activity was reactive to tactile stimuli. Sleep architecture in the form of sleep spindles were seen. No focal slowing or epileptiform activity was identified.   IMPRESSION: This is an abnormal EEG due to diffuse slowing.  CLINICAL CORRELATION: This EEG is consistent with diffuse non-specific cerebral dysfunction, at least in part due to sedating medications. . No electrographic seizures were seen to explain the patient's altered mental status.Marland Kitchen

## 2016-07-21 NOTE — Procedures (Signed)
Extubation Procedure Note  Patient Details:   Name: Mark Chang DOB: [birth year] MRN: [id number]   Airway Documentation:     Evaluation  O2 sats: stable throughout Complications: No apparent complications Patient did tolerate procedure well. Bilateral Breath Sounds: Diminished   Yes  Patient tolerated wean. MD ordered to extubate. Positive for cuff leak. Patient extubated to a 4 Lpm nasal cannula. No signs of dyspnea or stridor noted. Patient instructed on the usage of the Incentive Spirometer achieving 600 mL five times. Patient resting comfortably. RT at bedside.   Myrtie Neither 07/21/2016, 12:52 PM

## 2016-07-21 NOTE — Progress Notes (Signed)
[facility] Progress Note Patient Name: TEEJAY MEADER DOB: June 06, 1945 MRN: [id number]   Date of Service  07/21/2016  HPI/Events of Note  Patient extubated earlier today and has been able to swallow water and a diet coke.   eICU Interventions  Will advance diet to carb modified clear liquids.      Intervention Category Intermediate Interventions: Other:  Lysle Dingwall 07/21/2016, 5:02 PM

## 2016-07-21 NOTE — Progress Notes (Signed)
He was extubated successfully and was in no acute distress when I saw him this afternoon. He told me his name and where he was though was unsure of the year. He is looking forward to leaving the hospital, and I told him he would need to stay here for a few more days until he recovers.  I updated his brother, Mr. Abdoul Encinas, and he appreciated me sharing the progress. He would like to be kept abreast of updates and requests his primary provider to contact him as he won't be able to return to [place] until closer to the weekend.   He will be transferred to the Med/Surg, and [facility] will assume care tomorrow.

## 2016-07-22 ENCOUNTER — Inpatient Hospital Stay (HOSPITAL_COMMUNITY): Payer: Medicare Other

## 2016-07-22 LAB — GLUCOSE, CAPILLARY
GLUCOSE-CAPILLARY: 224 mg/dL — AB (ref 65–99)
GLUCOSE-CAPILLARY: 230 mg/dL — AB (ref 65–99)
GLUCOSE-CAPILLARY: 241 mg/dL — AB (ref 65–99)
Glucose-Capillary: 180 mg/dL — ABNORMAL HIGH (ref 65–99)
Glucose-Capillary: 173 mg/dL — ABNORMAL HIGH (ref 65–99)
Glucose-Capillary: 242 mg/dL — ABNORMAL HIGH (ref 65–99)

## 2016-07-22 LAB — PROCALCITONIN: Procalcitonin: <0.1 ng/mL

## 2016-07-22 MED ORDER — ACETAMINOPHEN 325 MG PO TABS: 650.0000 mg | ORAL_TABLET | ORAL | Status: DC | PRN

## 2016-07-22 NOTE — Clinical Social Work Note (Signed)
Clinical Social Work Assessment  Patient Details  Name: Mark Chang MRN: [id number] Date of Birth: [birth year]  Date of referral:  07/22/16               Reason for consult:  Discharge Planning                Permission sought to share information with:  Facility Sport and exercise psychologist, Family Supports Permission granted to share information::  Yes, Verbal Permission Granted  Name::     Mark Chang  Agency::  ALF  Relationship::  Brother  Contact Information:  [phone]  Housing/Transportation Living arrangements for the past 2 months:  [place] of Information:  Patient, Facility Patient Interpreter Needed:  None Criminal Activity/Legal Involvement Pertinent to Current Situation/Hospitalization:  No - Comment as needed Significant Relationships:  Siblings Lives with:  Facility Resident Do you feel safe going back to the place where you live?  Yes Need for family participation in patient care:  Yes (Comment)  Care giving concerns:  CSW received consult regarding discharge planning. Patient resides at [place] and will return at discharge.  CSW to continue to follow and assist with discharge planning needs.   Social Worker assessment / plan:  Patient will discharge to ALF by PTAR.  Employment status:  Retired Forensic scientist:  Medicare PT Recommendations:  Not assessed at this time Information / Referral to community resources:     Patient/Family's Response to care:  Patient reports understanding with discharge plan.   Patient/Family's Understanding of and Emotional Response to Diagnosis, Current Treatment, and Prognosis:  Patient expressed understanding of CSW role and discharge process. No questions/concerns about plan or treatment.    Emotional Assessment Appearance:  Appears stated age Attitude/Demeanor/Rapport:  Unable to Assess Affect (typically observed):  Unable to Assess Orientation:  Oriented to Self, Oriented to Place Alcohol /  Substance use:  Not Applicable Psych involvement (Current and /or in the community):  No (Comment)  Discharge Needs  Concerns to be addressed:  Care Coordination Readmission within the last 30 days:  No Current discharge risk:  None Barriers to Discharge:  Continued Medical Work up   [employer], [place] 07/22/2016, 12:17 PM

## 2016-07-22 NOTE — Progress Notes (Signed)
Pharmacy Antibiotic Note  Mark Chang is a 71 y.o. male admitted on 07/18/2016 with altered mental status.  Pt developed a fever 4/22 and pharmacy was consulted for Vancomycin and Zosyn dosing for empiric PNA coverage.  Today pt is clinically improved.  Spoke with MD, will discontinue Vancomycin.  Plan: Discontinue Vancomycin. Continue Zosyn 3.375g IV q8h (4 hour infusion).  Follow-up clinical progress, renal function, length of therapy.   Height: 5\' 8"  (172.7 cm) Weight: 263 lb (119.3 kg) IBW/kg (Calculated) : 68.4  Temp (24hrs), Avg:98.1 F (36.7 C), Min:97.8 F (36.6 C), Max:98.9 F (37.2 C)   Recent Labs Lab 07/18/16 1844 07/18/16 1848 07/19/16 0549 07/20/16 1136 07/21/16 0301  WBC 6.8  --  8.5 9.5 9.3  CREATININE 1.26* 1.20  --  1.08 1.20    Estimated Creatinine Clearance: 70.9 mL/min (by C-G formula based on SCr of 1.2 mg/dL).    No Known Allergies  Antimicrobials this admission: 4/22 Vanc >> 4/25 4/22 Zosyn >>   Dose adjustments this admission:   Microbiology results: 4/22 BCx x2: ngtd 4/21 MRSA PCR: positive  Thank you for allowing pharmacy to be a part of this patient's care.  [employer], Pharm.D., BCPS Clinical Pharmacist Pager: [phone] Clinical phone for 07/22/2016 from 8:30-4:00 is [phone]. After 4pm, please call Main [facility] [phone]) for assistance. 07/22/2016 2:48 PM

## 2016-07-22 NOTE — Progress Notes (Signed)
CSW left voicemail for patient's brother. Patient resides at [facility]. Patient stated he does live there but could not tell me if he required PTAR. Facility confirmed that [facility] is the only option for transport.  Percell Locus Rayford Williamsen LCSWA [phone]

## 2016-07-22 NOTE — Clinical Social Work Note (Signed)
Clinical Social Worker continuing to follow patient and family for support and discharge planning needs.  CSW spoke with MD who states the plan is for patient to discharge tomorrow.  Facility updated.  CSW to facilitate patient discharge needs once medically ready.  Barbette Or, LCSW (Coverage) [phone]

## 2016-07-22 NOTE — Progress Notes (Signed)
PROGRESS NOTE    Mark Chang  POE:[id number] DOB: 12/22/1945 DOA: 07/18/2016 PCP: Terrill Mohr, NP    Brief Narrative: 71 y/o man with a history of mental retardation, COPD, HTN, who presented after sudden mental status changes.  Assessment & Plan:   Active Problems:   Encephalopathy   Acute respiratory failure (HCC)  Acute encephalopathy of unclear etiology:  He is awake and alert and abel to hold a conversation.    Acute respiratory failure, treating for pneumonia.  On day 5 of IV antibiotics, intubated and extubated successfully on 4/24.  Will transition to oral antibiotics in am and plan for discharge in am.    Diabetes Mellitus:  CBG (last 3)   Recent Labs  07/22/16 0558 07/22/16 0815 07/22/16 1132  GLUCAP 180* 173* 230*    Resume SSI.   Normocytic anemia:  Stable hemoglobin.       DVT prophylaxis: (Lovenox/) Code Status: (Full) Family Communication: none at bedside.  Disposition Plan: pending fruther eval.    Consultants:   Neurology  PCCM.    Procedures: EXTUBATED ON 4/24.    Antimicrobials: vancomycin and zosyn.    Subjective: Alert and on RA. Coughing , and pausing in the middle of a sentence.   Objective: Vitals:   07/21/16 1900 07/21/16 1925 07/21/16 2151 07/22/16 0508  BP: 136/65  136/79 131/74  Pulse: 85  94 84  Resp: 14  18 18   Temp:  97.9 F (36.6 C) 98.9 F (37.2 C) 97.8 F (36.6 C)  TempSrc:  Oral Oral   SpO2: 95%  98% 96%  Weight:      Height:        Intake/Output Summary (Last 24 hours) at 07/22/16 0857 Last data filed at 07/22/16 0509  Gross per 24 hour  Intake             1340 ml  Output              410 ml  Net              930 ml   Filed Weights   07/19/16 0401 07/20/16 0356 07/21/16 0411  Weight: 122.4 kg (269 lb 13.5 oz) 122.4 kg (269 lb 13.5 oz) 122.4 kg (269 lb 13.5 oz)    Examination:  General exam: Appears calm and comfortable  Respiratory system: scattered rhonchi. No wheezing heard.    Cardiovascular system: S1 & S2 heard, RRR. No JVD, murmurs, rubs, gallops or clicks. No pedal edema. Gastrointestinal system: Abdomen is nondistended, soft and nontender. No organomegaly or masses felt. Normal bowel sounds heard. Central nervous system: Alert and oriented to place and person, not to time, non focal .. Extremities: Symmetric 5 x 5 power. Skin: No rashes, lesions or ulcers    Data Reviewed: I have personally reviewed following labs and imaging studies  CBC:  Recent Labs Lab 07/18/16 1844 07/18/16 1848 07/19/16 0549 07/20/16 1136 07/21/16 0301  WBC 6.8  --  8.5 9.5 9.3  NEUTROABS 3.8  --   --   --   --   HGB 11.9* 12.2* 10.9* 12.6* 11.4*  HCT 36.3* 36.0* 33.7* 39.4 35.7*  MCV 87.7  --  88.7 90.8 89.3  PLT 178  --  157 170 315   Basic Metabolic Panel:  Recent Labs Lab 07/18/16 1844 07/18/16 1848 07/19/16 1000 07/20/16 1136 07/21/16 0301  NA 137 137  --  142 141  K 4.3 4.3  --  3.6 3.9  CL 98* 102  --  104 108  CO2 23  --   --  25 25  GLUCOSE 341* 352*  --  220* 265*  BUN 19 26*  --  12 13  CREATININE 1.26* 1.20  --  1.08 1.20  CALCIUM 9.2  --   --  8.7* 8.7*  MG  --   --  1.7  --   --   PHOS  --   --  2.6  --   --    GFR: Estimated Creatinine Clearance: 71.9 mL/min (by C-G formula based on SCr of 1.2 mg/dL). Liver Function Tests:  Recent Labs Lab 07/18/16 1844  AST 26  ALT 20  ALKPHOS 56  BILITOT 0.5  PROT 6.4*  ALBUMIN 3.3*   No results for input(s): LIPASE, AMYLASE in the last 168 hours.  Recent Labs Lab 07/18/16 1941  AMMONIA 50*   Coagulation Profile:  Recent Labs Lab 07/18/16 1844  INR 0.99   Cardiac Enzymes: No results for input(s): CKTOTAL, CKMB, CKMBINDEX, TROPONINI in the last 168 hours. BNP (last 3 results) No results for input(s): PROBNP in the last 8760 hours. HbA1C: No results for input(s): HGBA1C in the last 72 hours. CBG:  Recent Labs Lab 07/21/16 1556 07/21/16 1923 07/22/16 0023 07/22/16 0558  07/22/16 0815  GLUCAP 263* 271* 241* 180* 173*   Lipid Profile: No results for input(s): CHOL, HDL, LDLCALC, TRIG, CHOLHDL, LDLDIRECT in the last 72 hours. Thyroid Function Tests:  Recent Labs  07/19/16 1000  TSH 0.562  FREET4 0.88   Anemia Panel:  Recent Labs  07/19/16 1000  VITAMINB12 592   Sepsis Labs:  Recent Labs Lab 07/20/16 1136 07/21/16 0301  PROCALCITON <0.10 0.14    Recent Results (from the past 240 hour(s))  MRSA PCR Screening     Status: Abnormal   Collection Time: 07/18/16 10:50 PM  Result Value Ref Range Status   MRSA by PCR POSITIVE (A) NEGATIVE Final    Comment:        The GeneXpert MRSA Assay (FDA approved for NASAL specimens only), is one component of a comprehensive MRSA colonization surveillance program. It is not intended to diagnose MRSA infection nor to guide or monitor treatment for MRSA infections. RESULT CALLED TO, READ BACK BY AND VERIFIED WITH: WOODSON,B RN [id number] AT 0119 SKEEN,P   Culture, blood (routine x 2)     Status: None (Preliminary result)   Collection Time: 07/19/16  7:18 AM  Result Value Ref Range Status   Specimen Description BLOOD RIGHT HAND  Final   Special Requests BOTTLES DRAWN AEROBIC ONLY BCAV  Final   Culture NO GROWTH 2 DAYS  Final   Report Status PENDING  Incomplete  Culture, blood (routine x 2)     Status: None (Preliminary result)   Collection Time: 07/19/16  7:29 AM  Result Value Ref Range Status   Specimen Description BLOOD RIGHT HAND  Final   Special Requests BOTTLES DRAWN AEROBIC ONLY BCAV  Final   Culture NO GROWTH 2 DAYS  Final   Report Status PENDING  Incomplete         Radiology Studies: Mr Brain 77 Contrast  Result Date: 07/21/2016 CLINICAL DATA:  Acute encephalopathy EXAM: MRI HEAD WITHOUT CONTRAST TECHNIQUE: Multiplanar, multiecho pulse sequences of the brain and surrounding structures were obtained without intravenous contrast. COMPARISON:  head CT 07/18/2016 FINDINGS: Brain: No focal  diffusion restriction to indicate acute infarct. No intraparenchymal hemorrhage. There is beginning confluent hyperintense T2-weighted signal within the periventricular and deep white matter, most  often seen in the setting of chronic microvascular ischemia. No mass lesion or midline shift. No hydrocephalus or extra-axial fluid collection. The midline structures are normal. No age advanced or lobar predominant atrophy. Vascular: Major intracranial arterial and venous sinus flow voids are preserved. No evidence of chronic microhemorrhage or amyloid angiopathy. Skull and upper cervical spine: The visualized skull base, calvarium, upper cervical spine and extracranial soft tissues are normal. Sinuses/Orbits: Moderate left mastoid mucosal thickening. No mastoid effusion. Normal orbits. IMPRESSION: Chronic microvascular ischemia without acute intracranial abnormality. Electronically Signed   By: Ulyses Jarred M.D.   On: 07/21/2016 23:53   Dg Chest Port 1 View  Result Date: 07/22/2016 CLINICAL DATA:  Extubation.  Follow-up left basilar atelectasis. EXAM: PORTABLE CHEST 1 VIEW COMPARISON:  07/21/2016, 07/20/2016, 07/18/2016. FINDINGS: Extubation. Stable mild left basilar atelectasis and new mild right basilar atelectasis. Lungs otherwise clear. Pulmonary vascularity normal. Cardiac silhouette enlarged, unchanged. IMPRESSION: 1. Stable mild left basilar atelectasis and new mild right basilar atelectasis post extubation. 2.  No acute cardiopulmonary disease otherwise. 3. Cardiomegaly without evidence of pulmonary edema. Electronically Signed   By: Evangeline Dakin M.D.   On: 07/22/2016 07:40   Dg Chest Port 1 View  Result Date: 07/21/2016 CLINICAL DATA:  Hypoxia EXAM: PORTABLE CHEST 1 VIEW COMPARISON:  July 20, 2016 FINDINGS: Endotracheal tube tip is 2.4 cm above the carina. Nasogastric tube tip and side port are below the diaphragm. No pneumothorax. There is slight left base atelectasis. Lungs elsewhere are clear.  Heart is upper normal in size with pulmonary vascularity within normal limits. No adenopathy. There is aortic atherosclerosis. No bone lesions. IMPRESSION: Tube positions as described without pneumothorax. Left base atelectasis. Lungs elsewhere clear. Stable cardiac silhouette. There is aortic atherosclerosis. Electronically Signed   By: Lowella Grip III M.D.   On: 07/21/2016 09:55   Dg Chest Port 1 View  Result Date: 07/20/2016 CLINICAL DATA:  Status post intubation EXAM: PORTABLE CHEST 1 VIEW COMPARISON:  07/18/2016 FINDINGS: Cardiac shadow is mildly enlarged. Endotracheal tube is again noted approximately 2.9 cm above the carina. Nasogastric catheter is seen within the stomach. No focal infiltrate or sizable effusion is seen. No bony abnormality is noted. IMPRESSION: Tubes and lines as described above.  No acute abnormality noted. Electronically Signed   By: Inez Catalina M.D.   On: 07/20/2016 09:36        Scheduled Meds: . enoxaparin (LOVENOX) injection  40 mg Subcutaneous Q24H  . insulin aspart  0-15 Units Subcutaneous Q4H  . mouth rinse  15 mL Mouth Rinse BID  . mupirocin ointment   Nasal BID  . pantoprazole (PROTONIX) IV  40 mg Intravenous Q24H   Continuous Infusions: . sodium chloride    . piperacillin-tazobactam (ZOSYN)  IV 3.375 g (07/22/16 0641)  . vancomycin Stopped (07/22/16 0103)     LOS: 4 days    Time spent: 41 min    Zully Frane, MD [facility] Pager [phone]  If 7PM-7AM, please contact night-coverage [email or web address] Password [facility] 07/22/2016, 8:57 AM

## 2016-07-22 NOTE — Progress Notes (Signed)
Subjective: Patient is awake and oriented to hospital but does not know the name of the hospital. He is unable to give me the month of the day. He is able to follow some commands.  Exam: Vitals:   07/21/16 2151 07/22/16 0508  BP: 136/79 131/74  Pulse: 94 84  Resp: 18 18  Temp: 98.9 F (37.2 C) 97.8 F (36.6 C)    HEENT-  Normocephalic, no lesions, without obvious abnormality.  Normal external eye and conjunctiva.  Normal TM's bilaterally.  Normal auditory canals and external ears. Normal external nose, mucus membranes and septum.  Normal pharynx.    Neuro:  CN: Pupils are equal and round-however patient continues to when this when looking into his eyes. Endorses double vision but states this is normal for him--unclear if this is accurate. They are symmetrically reactive from 3-->2 mm. EOMI without nystagmus.   Motor: Normal bulk, tone, and strength. 5/5 throughout. No drift.  Sensation: Intact to light touch.      Pertinent Labs/Diagnostics: MRI brain shows chronic microvascular ischemia without any acute intracranial abnormalities  EEG impression was that this is an abnormal EEG due to diffuse slowing however there was no epileptiform activity  Etta Quill PA-C [facility] [phone]  Impression: Acute AMS of unclear etiology. Certainly his improvement is encouraging. No evidence of seizure on initial 24 hour EEG, I would not favor empiric AEDs based on history at this point. With his improvement and normal testing, no further recommendations at this time.   Recommendations: 1) Neurology will sign off.    Roland Rack, MD [facility] [phone]  If 7pm- 7am, please page neurology on call as listed in [place].  07/22/2016, 11:30 AM

## 2016-07-23 LAB — GLUCOSE, CAPILLARY
GLUCOSE-CAPILLARY: 162 mg/dL — AB (ref 65–99)
GLUCOSE-CAPILLARY: 170 mg/dL — AB (ref 65–99)
GLUCOSE-CAPILLARY: 204 mg/dL — AB (ref 65–99)
GLUCOSE-CAPILLARY: 280 mg/dL — AB (ref 65–99)

## 2016-07-23 MED ORDER — INSULIN ASPART 100 UNIT/ML ~~LOC~~ SOLN: SUBCUTANEOUS | 11 refills | Status: DC

## 2016-07-23 MED ORDER — LEVOFLOXACIN 500 MG PO TABS: 500.0000 mg | ORAL_TABLET | Freq: Every day | ORAL | 0 refills | Status: DC

## 2016-07-23 MED ORDER — LEVOFLOXACIN 500 MG PO TABS
500.0000 mg | ORAL_TABLET | Freq: Every day | ORAL | 0 refills | Status: AC
Start: 2016-07-24 — End: 2016-07-25

## 2016-07-23 MED ORDER — MUPIROCIN 2 % EX OINT: TOPICAL_OINTMENT | Freq: Two times a day (BID) | CUTANEOUS | 0 refills | Status: DC

## 2016-07-23 NOTE — Discharge Summary (Addendum)
Physician Discharge Summary  Mark Chang YTK:[id number] DOB: [birth year] DOA: 07/18/2016  PCP: Terrill Mohr, NP  Admit date: 07/18/2016 Discharge date: 07/23/2016  Admitted From: SNF Disposition:  snf  Recommendations for Outpatient Follow-up:  1. Follow up with PCP in 1-2 weeks 2. Please obtain BMP/CBC in one week     Discharge Condition:STABLE.  CODE STATUS: FULL,  Diet recommendation: Heart Healthy , NCS.  Brief/Interim Summary: 71 y/o man with a history of mental retardation, COPD, HTN, who presented after sudden mental status changes  Discharge Diagnoses:  Active Problems:   Encephalopathy   Acute respiratory failure (HCC)  Acute encephalopathy of unclear etiology:  He is awake and alert and able to hold a conversation. MRI brain negative.  Suspect encephalopathy from possible pneumonia.    Acute respiratory failure, treating for pneumonia.  Completed 6 days of antibiotics,  intubated and extubated successfully on 4/24.  Transitioned to oral antibiotics for another 2 days to complete the course. STOP DATE for antibiotic is 07/25/2016   Diabetes Mellitus:  CBG (last 3)   Recent Labs (last 2 labs)    Recent Labs  07/22/16 0558 07/22/16 0815 07/22/16 1132  GLUCAP 180* 173* 230*      Resume SSI. Resume home meds. Check cbg's TIDAC .   Normocytic anemia:  Stable hemoglobin.     Discharge Instructions  Discharge Instructions    Diet - low sodium heart healthy    Complete by:  As directed    Discharge instructions    Complete by:  As directed    [place] PCP I ONE WEEK.     Allergies as of 07/23/2016   No Known Allergies     Medication List    TAKE these medications   acetaminophen 500 MG tablet Commonly known as:  TYLENOL Take 500 mg by mouth 3 (three) times daily as needed for moderate pain.   atorvastatin 20 MG tablet Commonly known as:  LIPITOR Take 20 mg by mouth daily.   docusate sodium 100 MG  capsule Commonly known as:  COLACE Take 100 mg by mouth 2 (two) times daily. What changed:  Another medication with the same name was removed. Continue taking this medication, and follow the directions you see here.   escitalopram 10 MG tablet Commonly known as:  LEXAPRO Take 10 mg by mouth daily.   furosemide 40 MG tablet Commonly known as:  LASIX Take 40 mg by mouth daily.   insulin aspart 100 UNIT/ML injection Commonly known as:  novoLOG CBG 70 - 120: 0 units CBG 121 - 150: 2 units CBG 151 - 200: 3 units CBG 201 - 250: 5 units CBG 251 - 300: 8 units CBG 301 - 350: 11 units CBG 351 - 400: 15 units   insulin detemir 100 UNIT/ML injection Commonly known as:  LEVEMIR Inject 33 Units into the skin every morning.   levofloxacin 500 MG tablet Commonly known as:  LEVAQUIN Take 1 tablet (500 mg total) by mouth daily. Start taking on:  07/24/2016   lisinopril 40 MG tablet Commonly known as:  PRINIVIL,ZESTRIL Take 40 mg by mouth 2 (two) times daily.   Melatonin 5 MG Caps Take 5 mg by mouth at bedtime.   metFORMIN 500 MG 24 hr tablet Commonly known as:  GLUCOPHAGE-XR Take 1,000 mg by mouth 2 (two) times daily.   mupirocin ointment 2 % Commonly known as:  BACTROBAN Place into the nose 2 (two) times daily.   omega-3 acid ethyl esters  1 g capsule Commonly known as:  LOVAZA Take 1 g by mouth 2 (two) times daily.   potassium chloride SA 20 MEQ tablet Commonly known as:  K-DUR,KLOR-CON Take 20 mEq by mouth daily.   QC NASAL RELIEF SINUS NA Place 2 sprays into the nose every 12 (twelve) hours as needed (congestion).   ranitidine 150 MG capsule Commonly known as:  ZANTAC Take 150 mg by mouth 2 (two) times daily.   VITAMIN B 12 PO Take 1,000 mcg by mouth daily.   white petrolatum Gel Commonly known as:  VASELINE Apply 1 application topically at bedtime. Apply to heels      Follow-up Information    Terrill Mohr, NP. Schedule an appointment as soon as possible for a  visit in 1 week(s).   Specialty:  Nurse Practitioner Contact information: [address] [place] [address] [phone]          No Known Allergies  Consultations:  PCCM.   Procedures/Studies: Ct Angio Head W Or Wo Contrast  Result Date: 07/18/2016 CLINICAL DATA:  71 y/o  M; code stroke altered mental status. EXAM: CT ANGIOGRAPHY HEAD AND NECK TECHNIQUE: Multidetector CT imaging of the head and neck was performed using the standard protocol during bolus administration of intravenous contrast. Multiplanar CT image reconstructions and MIPs were obtained to evaluate the vascular anatomy. Carotid stenosis measurements (when applicable) are obtained utilizing NASCET criteria, using the distal internal carotid diameter as the denominator. CONTRAST:  50 cc Isovue 370 COMPARISON:  07/18/2016 CT of the head. FINDINGS: CTA NECK FINDINGS Aortic arch: Three-vessel arch. Moderate calcific atherosclerosis of the arch and great vessel origins without high-grade stenosis. Right carotid system: No evidence of dissection, stenosis (50% or greater) or occlusion. Minimal calcific atherosclerosis of the carotid bifurcation. Left carotid system: No evidence of dissection, stenosis (50% or greater) or occlusion. Mild calcified plaque of proximal ICA with mild 30-40% stenosis. Vertebral arteries: Codominant. No evidence of dissection, stenosis (50% or greater) or occlusion. Skeleton: Mild-to-moderate cervical spondylosis greatest at the C5 through C7 levels. No high-grade bony canal stenosis or foraminal narrowing. Other neck: Negative. Endotracheal and enteric tubes extend below the field of view. Upper chest: Negative. Review of the MIP images confirms the above findings CTA HEAD FINDINGS Anterior circulation: No significant stenosis, proximal occlusion, aneurysm, or vascular malformation. Moderate calcified plaque of cavernous and paraclinoid segments with mild paraclinoid stenosis bilaterally. Posterior  circulation: No significant stenosis, proximal occlusion, aneurysm, or vascular malformation. Calcified plaque of bilateral V4 segments with mild stenosis. Venous sinuses: As permitted by contrast timing, patent. Anatomic variants: Patent anterior communicating artery. Large left posterior communicating artery and small left P1 segment compatible with persistent fetal circulation. No right posterior communicating artery identified, likely hypoplastic or absent. Review of the MIP images confirms the above findings IMPRESSION: 1. Carotid and vertebral arteries are patent. No evidence of dissection, high-grade stenosis, or occlusion. 2. Patent circle of Willis. No evidence of proximal occlusion, high-grade stenosis, aneurysm, or vascular malformation. 3. Left proximal internal carotid artery calcified plaque with mild 30-40% stenosis. 4. Bilateral cavernous/paraclinoid internal carotid arteries and the V4 segment calcified plaque with mild stenosis. These results were called by telephone at the time of interpretation on 07/18/2016 at 7:49 pm to Dr. Reather Converse, who verbally acknowledged these results. Electronically Signed   By: Kristine Garbe M.D.   On: 07/18/2016 19:53   Ct Angio Neck W Or Wo Contrast  Result Date: 07/18/2016 CLINICAL DATA:  71 y/o  M; code stroke  altered mental status. EXAM: CT ANGIOGRAPHY HEAD AND NECK TECHNIQUE: Multidetector CT imaging of the head and neck was performed using the standard protocol during bolus administration of intravenous contrast. Multiplanar CT image reconstructions and MIPs were obtained to evaluate the vascular anatomy. Carotid stenosis measurements (when applicable) are obtained utilizing NASCET criteria, using the distal internal carotid diameter as the denominator. CONTRAST:  50 cc Isovue 370 COMPARISON:  07/18/2016 CT of the head. FINDINGS: CTA NECK FINDINGS Aortic arch: Three-vessel arch. Moderate calcific atherosclerosis of the arch and great vessel origins  without high-grade stenosis. Right carotid system: No evidence of dissection, stenosis (50% or greater) or occlusion. Minimal calcific atherosclerosis of the carotid bifurcation. Left carotid system: No evidence of dissection, stenosis (50% or greater) or occlusion. Mild calcified plaque of proximal ICA with mild 30-40% stenosis. Vertebral arteries: Codominant. No evidence of dissection, stenosis (50% or greater) or occlusion. Skeleton: Mild-to-moderate cervical spondylosis greatest at the C5 through C7 levels. No high-grade bony canal stenosis or foraminal narrowing. Other neck: Negative. Endotracheal and enteric tubes extend below the field of view. Upper chest: Negative. Review of the MIP images confirms the above findings CTA HEAD FINDINGS Anterior circulation: No significant stenosis, proximal occlusion, aneurysm, or vascular malformation. Moderate calcified plaque of cavernous and paraclinoid segments with mild paraclinoid stenosis bilaterally. Posterior circulation: No significant stenosis, proximal occlusion, aneurysm, or vascular malformation. Calcified plaque of bilateral V4 segments with mild stenosis. Venous sinuses: As permitted by contrast timing, patent. Anatomic variants: Patent anterior communicating artery. Large left posterior communicating artery and small left P1 segment compatible with persistent fetal circulation. No right posterior communicating artery identified, likely hypoplastic or absent. Review of the MIP images confirms the above findings IMPRESSION: 1. Carotid and vertebral arteries are patent. No evidence of dissection, high-grade stenosis, or occlusion. 2. Patent circle of Willis. No evidence of proximal occlusion, high-grade stenosis, aneurysm, or vascular malformation. 3. Left proximal internal carotid artery calcified plaque with mild 30-40% stenosis. 4. Bilateral cavernous/paraclinoid internal carotid arteries and the V4 segment calcified plaque with mild stenosis. These results  were called by telephone at the time of interpretation on 07/18/2016 at 7:49 pm to Dr. Reather Converse, who verbally acknowledged these results. Electronically Signed   By: Kristine Garbe M.D.   On: 07/18/2016 19:53   Mr Brain Wo Contrast  Result Date: 07/21/2016 CLINICAL DATA:  Acute encephalopathy EXAM: MRI HEAD WITHOUT CONTRAST TECHNIQUE: Multiplanar, multiecho pulse sequences of the brain and surrounding structures were obtained without intravenous contrast. COMPARISON:  head CT 07/18/2016 FINDINGS: Brain: No focal diffusion restriction to indicate acute infarct. No intraparenchymal hemorrhage. There is beginning confluent hyperintense T2-weighted signal within the periventricular and deep white matter, most often seen in the setting of chronic microvascular ischemia. No mass lesion or midline shift. No hydrocephalus or extra-axial fluid collection. The midline structures are normal. No age advanced or lobar predominant atrophy. Vascular: Major intracranial arterial and venous sinus flow voids are preserved. No evidence of chronic microhemorrhage or amyloid angiopathy. Skull and upper cervical spine: The visualized skull base, calvarium, upper cervical spine and extracranial soft tissues are normal. Sinuses/Orbits: Moderate left mastoid mucosal thickening. No mastoid effusion. Normal orbits. IMPRESSION: Chronic microvascular ischemia without acute intracranial abnormality. Electronically Signed   By: Ulyses Jarred M.D.   On: 07/21/2016 23:53   Dg Chest Port 1 View  Result Date: 07/22/2016 CLINICAL DATA:  Extubation.  Follow-up left basilar atelectasis. EXAM: PORTABLE CHEST 1 VIEW COMPARISON:  07/21/2016, 07/20/2016, 07/18/2016. FINDINGS: Extubation. Stable mild left basilar atelectasis  and new mild right basilar atelectasis. Lungs otherwise clear. Pulmonary vascularity normal. Cardiac silhouette enlarged, unchanged. IMPRESSION: 1. Stable mild left basilar atelectasis and new mild right basilar  atelectasis post extubation. 2.  No acute cardiopulmonary disease otherwise. 3. Cardiomegaly without evidence of pulmonary edema. Electronically Signed   By: Evangeline Dakin M.D.   On: 07/22/2016 07:40   Dg Chest Port 1 View  Result Date: 07/21/2016 CLINICAL DATA:  Hypoxia EXAM: PORTABLE CHEST 1 VIEW COMPARISON:  July 20, 2016 FINDINGS: Endotracheal tube tip is 2.4 cm above the carina. Nasogastric tube tip and side port are below the diaphragm. No pneumothorax. There is slight left base atelectasis. Lungs elsewhere are clear. Heart is upper normal in size with pulmonary vascularity within normal limits. No adenopathy. There is aortic atherosclerosis. No bone lesions. IMPRESSION: Tube positions as described without pneumothorax. Left base atelectasis. Lungs elsewhere clear. Stable cardiac silhouette. There is aortic atherosclerosis. Electronically Signed   By: Lowella Grip III M.D.   On: 07/21/2016 09:55   Dg Chest Port 1 View  Result Date: 07/20/2016 CLINICAL DATA:  Status post intubation EXAM: PORTABLE CHEST 1 VIEW COMPARISON:  07/18/2016 FINDINGS: Cardiac shadow is mildly enlarged. Endotracheal tube is again noted approximately 2.9 cm above the carina. Nasogastric catheter is seen within the stomach. No focal infiltrate or sizable effusion is seen. No bony abnormality is noted. IMPRESSION: Tubes and lines as described above.  No acute abnormality noted. Electronically Signed   By: Inez Catalina M.D.   On: 07/20/2016 09:36   Dg Chest Portable 1 View  Result Date: 07/18/2016 CLINICAL DATA:  71 y/o  M; post intubation. EXAM: PORTABLE CHEST 1 VIEW COMPARISON:  None. FINDINGS: Endotracheal tube tip 2.6 cm from carina. Low lung volumes. Hazy opacification of lungs may represent edema or atelectasis. Normal cardiac silhouette given projection and technique. No pneumothorax. No acute osseous abnormality is evident. Enteric tube tip below the field of view abdomen. IMPRESSION: Endotracheal tube tip 2.6 cm  from the carina. Enteric tube tip below the field of view and abdomen. Low lung volumes. Hazy lung may represent edema or atelectasis. Electronically Signed   By: Kristine Garbe M.D.   On: 07/18/2016 19:16   Ct Head Code Stroke Wo Contrast  Result Date: 07/18/2016 CLINICAL DATA:  Code stroke.  Altered mental status EXAM: CT HEAD WITHOUT CONTRAST TECHNIQUE: Contiguous axial images were obtained from the base of the skull through the vertex without intravenous contrast. COMPARISON:  Head CT 11/18/2014 FINDINGS: Brain: No mass lesion, intraparenchymal hemorrhage or extra-axial collection. No evidence of acute cortical infarct. There is periventricular hypoattenuation compatible with chronic microvascular disease. Vascular: Atherosclerotic calcification of the vertebral and internal carotid arteries at the skull base. Skull: Normal visualized skull base, calvarium and extracranial soft tissues. Sinuses/Orbits: No sinus fluid levels or advanced mucosal thickening. No mastoid effusion. Normal orbits. ASPECTS [facility] Stroke Program Early CT Score) - Ganglionic level infarction (caudate, lentiform nuclei, internal capsule, insula, M1-M3 cortex): 7 - Supraganglionic infarction (M4-M6 cortex): 3 Total score (0-10 with 10 being normal): 10 IMPRESSION: 1. No acute intracranial abnormality. 2. ASPECTS is 10. These results were called by telephone at the time of interpretation on 07/18/2016 at 7:31 pm to Dr. Wallie Char, who verbally acknowledged these results. Electronically Signed   By: Ulyses Jarred M.D.   On: 07/18/2016 19:32      Subjective: No new complaints.   Discharge Exam: Vitals:   07/22/16 2126 07/23/16 0604  BP: 130/72 (!) 157/69  Pulse: 87  90  Resp: 18 18  Temp: 98.3 F (36.8 C) 98.3 F (36.8 C)   Vitals:   07/22/16 1100 07/22/16 1338 07/22/16 2126 07/23/16 0604  BP:  138/76 130/72 (!) 157/69  Pulse:  93 87 90  Resp:  18 18 18   Temp:  97.8 F (36.6 C) 98.3 F (36.8 C) 98.3  F (36.8 C)  TempSrc:  Oral Oral Oral  SpO2:  99% 98% 95%  Weight: 119.3 kg (263 lb)     Height:        General: Pt is alert, awake, not in acute distress Cardiovascular: RRR, S1/S2 +, no rubs, no gallops Respiratory: CTA bilaterally, no wheezing, no rhonchi Abdominal: Soft, NT, ND, bowel sounds + Extremities: no edema, no cyanosis    The results of significant diagnostics from this hospitalization (including imaging, microbiology, ancillary and laboratory) are listed below for reference.     Microbiology: Recent Results (from the past 240 hour(s))  MRSA PCR Screening     Status: Abnormal   Collection Time: 07/18/16 10:50 PM  Result Value Ref Range Status   MRSA by PCR POSITIVE (A) NEGATIVE Final    Comment:        The GeneXpert MRSA Assay (FDA approved for NASAL specimens only), is one component of a comprehensive MRSA colonization surveillance program. It is not intended to diagnose MRSA infection nor to guide or monitor treatment for MRSA infections. RESULT CALLED TO, READ BACK BY AND VERIFIED WITH: WOODSON,B RN [id number] AT 0119 SKEEN,P   Culture, blood (routine x 2)     Status: None (Preliminary result)   Collection Time: 07/19/16  7:18 AM  Result Value Ref Range Status   Specimen Description BLOOD RIGHT HAND  Final   Special Requests BOTTLES DRAWN AEROBIC ONLY BCAV  Final   Culture NO GROWTH 3 DAYS  Final   Report Status PENDING  Incomplete  Culture, blood (routine x 2)     Status: None (Preliminary result)   Collection Time: 07/19/16  7:29 AM  Result Value Ref Range Status   Specimen Description BLOOD RIGHT HAND  Final   Special Requests BOTTLES DRAWN AEROBIC ONLY BCAV  Final   Culture NO GROWTH 3 DAYS  Final   Report Status PENDING  Incomplete     Labs: BNP (last 3 results) No results for input(s): BNP in the last 8760 hours. Basic Metabolic Panel:  Recent Labs Lab 07/18/16 1844 07/18/16 1848 07/19/16 1000 07/20/16 1136 07/21/16 0301  NA 137 137   --  142 141  K 4.3 4.3  --  3.6 3.9  CL 98* 102  --  104 108  CO2 23  --   --  25 25  GLUCOSE 341* 352*  --  220* 265*  BUN 19 26*  --  12 13  CREATININE 1.26* 1.20  --  1.08 1.20  CALCIUM 9.2  --   --  8.7* 8.7*  MG  --   --  1.7  --   --   PHOS  --   --  2.6  --   --    Liver Function Tests:  Recent Labs Lab 07/18/16 1844  AST 26  ALT 20  ALKPHOS 56  BILITOT 0.5  PROT 6.4*  ALBUMIN 3.3*   No results for input(s): LIPASE, AMYLASE in the last 168 hours.  Recent Labs Lab 07/18/16 1941  AMMONIA 50*   CBC:  Recent Labs Lab 07/18/16 1844 07/18/16 1848 07/19/16 0549 07/20/16 1136 07/21/16 0301  WBC  6.8  --  8.5 9.5 9.3  NEUTROABS 3.8  --   --   --   --   HGB 11.9* 12.2* 10.9* 12.6* 11.4*  HCT 36.3* 36.0* 33.7* 39.4 35.7*  MCV 87.7  --  88.7 90.8 89.3  PLT 178  --  157 170 167   Cardiac Enzymes: No results for input(s): CKTOTAL, CKMB, CKMBINDEX, TROPONINI in the last 168 hours. BNP: Invalid input(s): POCBNP CBG:  Recent Labs Lab 07/22/16 1614 07/22/16 2217 07/23/16 0019 07/23/16 0415 07/23/16 0755  GLUCAP 224* 242* 204* 162* 170*   D-Dimer No results for input(s): DDIMER in the last 72 hours. Hgb A1c No results for input(s): HGBA1C in the last 72 hours. Lipid Profile No results for input(s): CHOL, HDL, LDLCALC, TRIG, CHOLHDL, LDLDIRECT in the last 72 hours. Thyroid function studies No results for input(s): TSH, T4TOTAL, T3FREE, THYROIDAB in the last 72 hours.  Invalid input(s): FREET3 Anemia work up No results for input(s): VITAMINB12, FOLATE, FERRITIN, TIBC, IRON, RETICCTPCT in the last 72 hours. Urinalysis    Component Value Date/Time   COLORURINE YELLOW 07/19/2016 1139   APPEARANCEUR CLEAR 07/19/2016 1139   LABSPEC 1.019 07/19/2016 1139   PHURINE 5.0 07/19/2016 1139   GLUCOSEU NEGATIVE 07/19/2016 1139   HGBUR NEGATIVE 07/19/2016 1139   BILIRUBINUR NEGATIVE 07/19/2016 1139   KETONESUR NEGATIVE 07/19/2016 1139   PROTEINUR 100 (A)  07/19/2016 1139   NITRITE NEGATIVE 07/19/2016 1139   LEUKOCYTESUR NEGATIVE 07/19/2016 1139   Sepsis Labs Invalid input(s): PROCALCITONIN,  WBC,  LACTICIDVEN Microbiology Recent Results (from the past 240 hour(s))  MRSA PCR Screening     Status: Abnormal   Collection Time: 07/18/16 10:50 PM  Result Value Ref Range Status   MRSA by PCR POSITIVE (A) NEGATIVE Final    Comment:        The GeneXpert MRSA Assay (FDA approved for NASAL specimens only), is one component of a comprehensive MRSA colonization surveillance program. It is not intended to diagnose MRSA infection nor to guide or monitor treatment for MRSA infections. RESULT CALLED TO, READ BACK BY AND VERIFIED WITH: WOODSON,B RN [id number] AT 0119 SKEEN,P   Culture, blood (routine x 2)     Status: None (Preliminary result)   Collection Time: 07/19/16  7:18 AM  Result Value Ref Range Status   Specimen Description BLOOD RIGHT HAND  Final   Special Requests BOTTLES DRAWN AEROBIC ONLY BCAV  Final   Culture NO GROWTH 3 DAYS  Final   Report Status PENDING  Incomplete  Culture, blood (routine x 2)     Status: None (Preliminary result)   Collection Time: 07/19/16  7:29 AM  Result Value Ref Range Status   Specimen Description BLOOD RIGHT HAND  Final   Special Requests BOTTLES DRAWN AEROBIC ONLY BCAV  Final   Culture NO GROWTH 3 DAYS  Final   Report Status PENDING  Incomplete     Time coordinating discharge: Over 30 minutes  SIGNED:   Hosie Poisson, MD  [facility] 07/23/2016, 11:42 AM Pager   If 7PM-7AM, please contact night-coverage [email or web address] Password [email or web address]

## 2016-07-23 NOTE — Progress Notes (Signed)
Patient will DC to: [facility] Anticipated DC date: 07/23/16 Family notified: Brother Transport by: Corey Harold   Per MD patient ready for DC to [facility]. RN, patient, patient's family, and facility notified of DC. Discharge Summary sent to facility. RN given number for report. DC packet on chart. Ambulance transport requested for patient.   CSW signing off.  Cedric Fishman, [place] Social Worker [phone]

## 2016-07-23 NOTE — Progress Notes (Signed)
Report called to Rebecca

## 2016-07-23 NOTE — NC FL2 (Signed)
[place] LEVEL OF CARE SCREENING TOOL     IDENTIFICATION  Patient Name: Mark Chang Birthdate: 19-Apr-1945 Sex: male Admission Date (Current Location): 07/18/2016  [facility] and [place] Number:  Herbalist and Address:  [facility], [place] [address], [place], [place] [address]      Provider Number: [id number]  Attending Physician Name and Address:  Hosie Poisson, MD  Relative Name and Phone Number:       Current Level of Care: Hospital Recommended Level of Care: [place] Prior Approval Number:    Date Approved/Denied:   PASRR Number:    Discharge Plan: Other (Comment) (ALF)    Current Diagnoses: Patient Active Problem List   Diagnosis Date Noted  . Acute respiratory failure ([place])   . Encephalopathy 07/18/2016    Orientation RESPIRATION BLADDER Height & Weight     Self, Place  Normal Continent Weight: 119.3 kg (263 lb) Height:  5\' 8"  (172.7 cm)  BEHAVIORAL SYMPTOMS/MOOD NEUROLOGICAL BOWEL NUTRITION STATUS      Continent Diet (NCS)  AMBULATORY STATUS COMMUNICATION OF NEEDS Skin   Limited Assist Verbally Normal                       Personal Care Assistance Level of Assistance  Bathing, Feeding, Dressing Bathing Assistance: Maximum assistance Feeding assistance: Independent Dressing Assistance: Limited assistance     Functional Limitations Info             SPECIAL CARE FACTORS FREQUENCY                       Contractures      Additional Factors Info  Code Status, Allergies, Isolation Precautions Code Status Info: Full Allergies Info: NKA     Isolation Precautions Info: MRSA     Current Medications (07/23/2016):  Discharge Medications: TAKE these medications   acetaminophen 500 MG tablet Commonly known as:  TYLENOL Take 500 mg by mouth 3 (three) times daily as needed for moderate pain.   atorvastatin 20 MG tablet Commonly known as:  LIPITOR Take 20 mg by mouth  daily.   docusate sodium 100 MG capsule Commonly known as:  COLACE Take 100 mg by mouth 2 (two) times daily. What changed:  Another medication with the same name was removed. Continue taking this medication, and follow the directions you see here.   escitalopram 10 MG tablet Commonly known as:  LEXAPRO Take 10 mg by mouth daily.   furosemide 40 MG tablet Commonly known as:  LASIX Take 40 mg by mouth daily.   insulin aspart 100 UNIT/ML injection Commonly known as:  novoLOG CBG 70 - 120: 0 units CBG 121 - 150: 2 units CBG 151 - 200: 3 units CBG 201 - 250: 5 units CBG 251 - 300: 8 units CBG 301 - 350: 11 units CBG 351 - 400: 15 units   insulin detemir 100 UNIT/ML injection Commonly known as:  LEVEMIR Inject 33 Units into the skin every morning.   levofloxacin 500 MG tablet Commonly known as:  LEVAQUIN Take 1 tablet (500 mg total) by mouth daily. Start taking on:  07/24/2016   lisinopril 40 MG tablet Commonly known as:  PRINIVIL,ZESTRIL Take 40 mg by mouth 2 (two) times daily.   Melatonin 5 MG Caps Take 5 mg by mouth at bedtime.   metFORMIN 500 MG 24 hr tablet Commonly known as:  GLUCOPHAGE-XR Take 1,000 mg by mouth  2 (two) times daily.   mupirocin ointment 2 % Commonly known as:  BACTROBAN Place into the nose 2 (two) times daily.   omega-3 acid ethyl esters 1 g capsule Commonly known as:  LOVAZA Take 1 g by mouth 2 (two) times daily.   potassium chloride SA 20 MEQ tablet Commonly known as:  K-DUR,KLOR-CON Take 20 mEq by mouth daily.   QC NASAL RELIEF SINUS NA Place 2 sprays into the nose every 12 (twelve) hours as needed (congestion).   ranitidine 150 MG capsule Commonly known as:  ZANTAC Take 150 mg by mouth 2 (two) times daily.   VITAMIN B 12 PO Take 1,000 mcg by mouth daily.   white petrolatum Gel Commonly known as:  VASELINE Apply 1 application topically at bedtime. Apply to heels     Relevant Imaging Results:  Relevant Lab  Results:   Additional Information SSN: [place], [place]

## 2016-07-23 NOTE — Progress Notes (Signed)
Mark Chang to be D/C'd ALF per MD order. Discussed with the patient and all questions fully answered.  Allergies as of 07/23/2016   No Known Allergies     Medication List    TAKE these medications   acetaminophen 500 MG tablet Commonly known as:  TYLENOL Take 500 mg by mouth 3 (three) times daily as needed for moderate pain.   atorvastatin 20 MG tablet Commonly known as:  LIPITOR Take 20 mg by mouth daily.   docusate sodium 100 MG capsule Commonly known as:  COLACE Take 100 mg by mouth 2 (two) times daily. What changed:  Another medication with the same name was removed. Continue taking this medication, and follow the directions you see here.   escitalopram 10 MG tablet Commonly known as:  LEXAPRO Take 10 mg by mouth daily.   furosemide 40 MG tablet Commonly known as:  LASIX Take 40 mg by mouth daily.   insulin aspart 100 UNIT/ML injection Commonly known as:  novoLOG CBG 70 - 120: 0 units CBG 121 - 150: 2 units CBG 151 - 200: 3 units CBG 201 - 250: 5 units CBG 251 - 300: 8 units CBG 301 - 350: 11 units CBG 351 - 400: 15 units   insulin detemir 100 UNIT/ML injection Commonly known as:  LEVEMIR Inject 33 Units into the skin every morning.   levofloxacin 500 MG tablet Commonly known as:  LEVAQUIN Take 1 tablet (500 mg total) by mouth daily. Start taking on:  07/24/2016   lisinopril 40 MG tablet Commonly known as:  PRINIVIL,ZESTRIL Take 40 mg by mouth 2 (two) times daily.   Melatonin 5 MG Caps Take 5 mg by mouth at bedtime.   metFORMIN 500 MG 24 hr tablet Commonly known as:  GLUCOPHAGE-XR Take 1,000 mg by mouth 2 (two) times daily.   mupirocin ointment 2 % Commonly known as:  BACTROBAN Place into the nose 2 (two) times daily.   omega-3 acid ethyl esters 1 g capsule Commonly known as:  LOVAZA Take 1 g by mouth 2 (two) times daily.   potassium chloride SA 20 MEQ tablet Commonly known as:  K-DUR,KLOR-CON Take 20 mEq by mouth daily.   QC NASAL RELIEF SINUS  NA Place 2 sprays into the nose every 12 (twelve) hours as needed (congestion).   ranitidine 150 MG capsule Commonly known as:  ZANTAC Take 150 mg by mouth 2 (two) times daily.   VITAMIN B 12 PO Take 1,000 mcg by mouth daily.   white petrolatum Gel Commonly known as:  VASELINE Apply 1 application topically at bedtime. Apply to heels       VVS, Skin clean, dry and intact without evidence of skin break down, no evidence of skin tears noted.  IV catheter discontinued intact. Site without signs and symptoms of complications. Dressing and pressure applied.  An After Visit Summary was printed and given to the patient.  Patient escorted via stretcher, and D/C ALF via EMS.  Fabian November  07/23/2016 2:20 PM

## 2016-07-23 NOTE — Care Management Note (Signed)
Case Management Note  Patient Details  Name: Mark Chang MRN: [id number] Date of Birth: 02-08-1946  Subjective/Objective:   Pt presented for Encephalopathy and Acute Respiratory Failure. Pt is from [place]. CSW assisting with d/c for 07-23-16.                  Action/Plan: CSW stated that pt will be able to be managed at the ALF. No further needs from CM at this time.   Expected Discharge Date:  07/23/16               Expected Discharge Plan:  Assisted Living / Rest Home (ALF- [facility] )  In-House Referral:  Clinical Social Work  Discharge planning Services  CM Consult  Post Acute Care Choice:  NA Choice offered to:  NA  DME Arranged:  N/A DME Agency:  NA  HH Arranged:  NA HH Agency:  NA  Status of Service:  Completed, signed off  If discussed at [employer], dates discussed:    Additional Comments:  Bethena Roys, RN 07/23/2016, 10:59 AM

## 2016-07-24 LAB — CULTURE, BLOOD (ROUTINE X 2)
Culture: NO GROWTH
Culture: NO GROWTH

## 2017-03-20 ENCOUNTER — Other Ambulatory Visit: Payer: Self-pay

## 2017-03-20 ENCOUNTER — Inpatient Hospital Stay (HOSPITAL_COMMUNITY)
Admission: EM | Admit: 2017-03-20 | Discharge: 2017-03-24 | DRG: 871 | Disposition: A | Discharge status: Nursing Home (Includes ICF) | Payer: Medicare Other | Attending: Internal Medicine | Admitting: Internal Medicine

## 2017-03-20 ENCOUNTER — Encounter (HOSPITAL_COMMUNITY): Payer: Self-pay | Admitting: Family Medicine

## 2017-03-20 ENCOUNTER — Emergency Department (HOSPITAL_COMMUNITY): Payer: Medicare Other

## 2017-03-20 DIAGNOSIS — E785 Hyperlipidemia, unspecified: Secondary | ICD-10-CM | POA: Diagnosis present

## 2017-03-20 DIAGNOSIS — J189 Pneumonia, unspecified organism: Secondary | ICD-10-CM | POA: Diagnosis not present

## 2017-03-20 DIAGNOSIS — D510 Vitamin B12 deficiency anemia due to intrinsic factor deficiency: Secondary | ICD-10-CM | POA: Diagnosis present

## 2017-03-20 DIAGNOSIS — A4151 Sepsis due to Escherichia coli [E. coli]: Secondary | ICD-10-CM | POA: Diagnosis not present

## 2017-03-20 DIAGNOSIS — E1122 Type 2 diabetes mellitus with diabetic chronic kidney disease: Secondary | ICD-10-CM | POA: Diagnosis present

## 2017-03-20 DIAGNOSIS — I5041 Acute combined systolic (congestive) and diastolic (congestive) heart failure: Secondary | ICD-10-CM | POA: Diagnosis not present

## 2017-03-20 DIAGNOSIS — I129 Hypertensive chronic kidney disease with stage 1 through stage 4 chronic kidney disease, or unspecified chronic kidney disease: Secondary | ICD-10-CM | POA: Diagnosis present

## 2017-03-20 DIAGNOSIS — E1129 Type 2 diabetes mellitus with other diabetic kidney complication: Secondary | ICD-10-CM | POA: Diagnosis present

## 2017-03-20 DIAGNOSIS — E876 Hypokalemia: Secondary | ICD-10-CM | POA: Diagnosis not present

## 2017-03-20 DIAGNOSIS — N183 Chronic kidney disease, stage 3 unspecified: Secondary | ICD-10-CM | POA: Diagnosis present

## 2017-03-20 DIAGNOSIS — B9620 Unspecified Escherichia coli [E. coli] as the cause of diseases classified elsewhere: Secondary | ICD-10-CM | POA: Diagnosis not present

## 2017-03-20 DIAGNOSIS — Z6841 Body Mass Index (BMI) 40.0 and over, adult: Secondary | ICD-10-CM | POA: Diagnosis not present

## 2017-03-20 DIAGNOSIS — N179 Acute kidney failure, unspecified: Secondary | ICD-10-CM | POA: Diagnosis present

## 2017-03-20 DIAGNOSIS — N1831 Chronic kidney disease, stage 3a: Secondary | ICD-10-CM | POA: Diagnosis present

## 2017-03-20 DIAGNOSIS — F79 Unspecified intellectual disabilities: Secondary | ICD-10-CM | POA: Diagnosis not present

## 2017-03-20 DIAGNOSIS — J440 Chronic obstructive pulmonary disease with acute lower respiratory infection: Secondary | ICD-10-CM | POA: Diagnosis present

## 2017-03-20 DIAGNOSIS — Z79899 Other long term (current) drug therapy: Secondary | ICD-10-CM

## 2017-03-20 DIAGNOSIS — I10 Essential (primary) hypertension: Secondary | ICD-10-CM | POA: Diagnosis not present

## 2017-03-20 DIAGNOSIS — E118 Type 2 diabetes mellitus with unspecified complications: Secondary | ICD-10-CM | POA: Diagnosis not present

## 2017-03-20 DIAGNOSIS — Z794 Long term (current) use of insulin: Secondary | ICD-10-CM | POA: Diagnosis not present

## 2017-03-20 DIAGNOSIS — A419 Sepsis, unspecified organism: Secondary | ICD-10-CM | POA: Diagnosis present

## 2017-03-20 DIAGNOSIS — R7881 Bacteremia: Secondary | ICD-10-CM | POA: Diagnosis not present

## 2017-03-20 DIAGNOSIS — R6521 Severe sepsis with septic shock: Secondary | ICD-10-CM | POA: Diagnosis present

## 2017-03-20 DIAGNOSIS — E7849 Other hyperlipidemia: Secondary | ICD-10-CM | POA: Diagnosis not present

## 2017-03-20 DIAGNOSIS — N390 Urinary tract infection, site not specified: Secondary | ICD-10-CM | POA: Diagnosis not present

## 2017-03-20 DIAGNOSIS — E119 Type 2 diabetes mellitus without complications: Secondary | ICD-10-CM

## 2017-03-20 DIAGNOSIS — R509 Fever, unspecified: Secondary | ICD-10-CM

## 2017-03-20 DIAGNOSIS — E1165 Type 2 diabetes mellitus with hyperglycemia: Secondary | ICD-10-CM | POA: Diagnosis not present

## 2017-03-20 DIAGNOSIS — Y95 Nosocomial condition: Secondary | ICD-10-CM | POA: Diagnosis present

## 2017-03-20 HISTORY — DX: Morbid (severe) obesity due to excess calories: E66.01

## 2017-03-20 HISTORY — DX: Type 2 diabetes mellitus with other diabetic kidney complication: E11.29

## 2017-03-20 HISTORY — DX: Chronic kidney disease, stage 3 (moderate): N18.3

## 2017-03-20 LAB — GLUCOSE, CAPILLARY: Glucose-Capillary: 333 mg/dL — ABNORMAL HIGH (ref 65–99)

## 2017-03-20 LAB — COMPREHENSIVE METABOLIC PANEL
ALK PHOS: 52 U/L (ref 38–126)
ALT: 22 U/L (ref 17–63)
ANION GAP: 12 (ref 5–15)
AST: 29 U/L (ref 15–41)
Albumin: 3.1 g/dL — ABNORMAL LOW (ref 3.5–5.0)
BILIRUBIN TOTAL: 1 mg/dL (ref 0.3–1.2)
BUN: 28 mg/dL — ABNORMAL HIGH (ref 6–20)
CALCIUM: 8.5 mg/dL — AB (ref 8.9–10.3)
CO2: 21 mmol/L — ABNORMAL LOW (ref 22–32)
Chloride: 100 mmol/L — ABNORMAL LOW (ref 101–111)
Creatinine, Ser: 1.61 mg/dL — ABNORMAL HIGH (ref 0.61–1.24)
GFR, EST AFRICAN AMERICAN: 48 mL/min — AB (ref >60)
GFR, EST NON AFRICAN AMERICAN: 41 mL/min — AB (ref >60)
GLUCOSE: 372 mg/dL — AB (ref 65–99)
POTASSIUM: 3.7 mmol/L (ref 3.5–5.1)
Sodium: 133 mmol/L — ABNORMAL LOW (ref 135–145)
TOTAL PROTEIN: 7.2 g/dL (ref 6.5–8.1)

## 2017-03-20 LAB — URINALYSIS, ROUTINE W REFLEX MICROSCOPIC
BILIRUBIN URINE: NEGATIVE
Glucose, UA: >=500 mg/dL — AB
KETONES UR: NEGATIVE mg/dL
NITRITE: NEGATIVE
PH: 5 (ref 5.0–8.0)
Protein, ur: 100 mg/dL — AB
SPECIFIC GRAVITY, URINE: 1.015 (ref 1.005–1.030)

## 2017-03-20 LAB — CBC WITH DIFFERENTIAL/PLATELET
BASOS ABS: 0 10*3/uL (ref 0.0–0.1)
BASOS PCT: 0 %
Eosinophils Absolute: 0 10*3/uL (ref 0.0–0.7)
Eosinophils Relative: 0 %
HEMATOCRIT: 37.1 % — AB (ref 39.0–52.0)
Hemoglobin: 12.3 g/dL — ABNORMAL LOW (ref 13.0–17.0)
LYMPHS PCT: 4 %
Lymphs Abs: 0.6 10*3/uL — ABNORMAL LOW (ref 0.7–4.0)
MCH: 29.1 pg (ref 26.0–34.0)
MCHC: 33.2 g/dL (ref 30.0–36.0)
MCV: 87.7 fL (ref 78.0–100.0)
MONO ABS: 1.2 10*3/uL — AB (ref 0.1–1.0)
Monocytes Relative: 7 %
NEUTROS ABS: 14.4 10*3/uL — AB (ref 1.7–7.7)
Neutrophils Relative %: 89 %
PLATELETS: 168 10*3/uL (ref 150–400)
RBC: 4.23 MIL/uL (ref 4.22–5.81)
RDW: 14.3 % (ref 11.5–15.5)
WBC: 16.2 10*3/uL — AB (ref 4.0–10.5)

## 2017-03-20 LAB — PROTIME-INR
INR: 1.28
PROTHROMBIN TIME: 15.9 s — AB (ref 11.4–15.2)

## 2017-03-20 LAB — I-STAT CG4 LACTIC ACID, ED
LACTIC ACID, VENOUS: 3.32 mmol/L — AB (ref 0.5–1.9)
Lactic Acid, Venous: 4.01 mmol/L (ref 0.5–1.9)

## 2017-03-20 LAB — PROCALCITONIN: Procalcitonin: 3.81 ng/mL

## 2017-03-20 LAB — LACTIC ACID, PLASMA: Lactic Acid, Venous: 2.8 mmol/L (ref 0.5–1.9)

## 2017-03-20 LAB — APTT: APTT: 34 s (ref 24–36)

## 2017-03-20 MED ORDER — HEPARIN SODIUM (PORCINE) 5000 UNIT/ML IJ SOLN
5000.0000 [IU] | Freq: Three times a day (TID) | INTRAMUSCULAR | Status: DC
  Administered 2017-03-20 – 2017-03-24 (×11): 5000 [IU] via SUBCUTANEOUS
  Filled 2017-03-20 (×10): qty 1

## 2017-03-20 MED ORDER — FAMOTIDINE 20 MG PO TABS
20.0000 mg | ORAL_TABLET | Freq: Two times a day (BID) | ORAL | Status: DC
  Administered 2017-03-20 – 2017-03-24 (×8): 20 mg via ORAL
  Filled 2017-03-20 (×8): qty 1

## 2017-03-20 MED ORDER — POLYVINYL ALCOHOL 1.4 % OP SOLN: 1.0000 [drp] | Freq: Two times a day (BID) | OPHTHALMIC | Status: DC | PRN

## 2017-03-20 MED ORDER — ACETAMINOPHEN 325 MG PO TABS
650.0000 mg | ORAL_TABLET | Freq: Four times a day (QID) | ORAL | Status: DC | PRN
  Administered 2017-03-20 – 2017-03-21 (×2): 650 mg via ORAL
  Filled 2017-03-20 (×2): qty 2

## 2017-03-20 MED ORDER — VANCOMYCIN HCL IN DEXTROSE 750-5 MG/150ML-% IV SOLN
750.0000 mg | Freq: Two times a day (BID) | INTRAVENOUS | Status: DC
  Administered 2017-03-21 – 2017-03-24 (×7): 750 mg via INTRAVENOUS
  Filled 2017-03-20 (×7): qty 150

## 2017-03-20 MED ORDER — HYDROCODONE-ACETAMINOPHEN 5-325 MG PO TABS: 1.0000 | ORAL_TABLET | ORAL | Status: DC | PRN

## 2017-03-20 MED ORDER — ACETAMINOPHEN 650 MG RE SUPP: 650.0000 mg | Freq: Four times a day (QID) | RECTAL | Status: DC | PRN

## 2017-03-20 MED ORDER — ATORVASTATIN CALCIUM 20 MG PO TABS
20.0000 mg | ORAL_TABLET | Freq: Every day | ORAL | Status: DC
  Administered 2017-03-21 – 2017-03-22 (×2): 20 mg via ORAL
  Filled 2017-03-20 (×2): qty 1

## 2017-03-20 MED ORDER — POTASSIUM CHLORIDE CRYS ER 20 MEQ PO TBCR
20.0000 meq | EXTENDED_RELEASE_TABLET | Freq: Every day | ORAL | Status: DC
  Administered 2017-03-21 – 2017-03-24 (×4): 20 meq via ORAL
  Filled 2017-03-20 (×4): qty 1

## 2017-03-20 MED ORDER — VANCOMYCIN HCL 10 G IV SOLR
2500.0000 mg | Freq: Once | INTRAVENOUS | Status: AC
  Administered 2017-03-20: 2500 mg via INTRAVENOUS
  Filled 2017-03-20: qty 2500

## 2017-03-20 MED ORDER — PIPERACILLIN-TAZOBACTAM 3.375 G IVPB: 3.3750 g | Freq: Three times a day (TID) | INTRAVENOUS | Status: DC

## 2017-03-20 MED ORDER — INSULIN ASPART 100 UNIT/ML ~~LOC~~ SOLN
0.0000 [IU] | Freq: Three times a day (TID) | SUBCUTANEOUS | Status: DC
  Administered 2017-03-21 (×2): 5 [IU] via SUBCUTANEOUS
  Administered 2017-03-21: 3 [IU] via SUBCUTANEOUS
  Administered 2017-03-22: 5 [IU] via SUBCUTANEOUS
  Administered 2017-03-22: 3 [IU] via SUBCUTANEOUS
  Administered 2017-03-22: 2 [IU] via SUBCUTANEOUS
  Administered 2017-03-23: 3 [IU] via SUBCUTANEOUS
  Administered 2017-03-23: 5 [IU] via SUBCUTANEOUS
  Administered 2017-03-24: 3 [IU] via SUBCUTANEOUS
  Administered 2017-03-24: 2 [IU] via SUBCUTANEOUS
  Administered 2017-03-24: 3 [IU] via SUBCUTANEOUS

## 2017-03-20 MED ORDER — INSULIN ASPART 100 UNIT/ML ~~LOC~~ SOLN
0.0000 [IU] | Freq: Every day | SUBCUTANEOUS | Status: DC
  Administered 2017-03-20: 4 [IU] via SUBCUTANEOUS
  Administered 2017-03-22: 2 [IU] via SUBCUTANEOUS
  Administered 2017-03-23: 3 [IU] via SUBCUTANEOUS

## 2017-03-20 MED ORDER — ESCITALOPRAM OXALATE 10 MG PO TABS
10.0000 mg | ORAL_TABLET | Freq: Every day | ORAL | Status: DC
  Administered 2017-03-21 – 2017-03-24 (×4): 10 mg via ORAL
  Filled 2017-03-20 (×4): qty 1

## 2017-03-20 MED ORDER — POLYETHYLENE GLYCOL 3350 17 G PO PACK: 17.0000 g | PACK | Freq: Every day | ORAL | Status: DC | PRN

## 2017-03-20 MED ORDER — VITAMIN B-12 1000 MCG PO TABS
1000.0000 ug | ORAL_TABLET | Freq: Every day | ORAL | Status: DC
  Administered 2017-03-21 – 2017-03-24 (×4): 1000 ug via ORAL
  Filled 2017-03-20 (×4): qty 1

## 2017-03-20 MED ORDER — MELATONIN 3 MG PO TABS
9.0000 mg | ORAL_TABLET | Freq: Every day | ORAL | Status: DC
  Administered 2017-03-20 – 2017-03-23 (×4): 9 mg via ORAL
  Filled 2017-03-20 (×5): qty 3

## 2017-03-20 MED ORDER — DOCUSATE SODIUM 100 MG PO CAPS
100.0000 mg | ORAL_CAPSULE | Freq: Two times a day (BID) | ORAL | Status: DC
  Administered 2017-03-20 – 2017-03-24 (×8): 100 mg via ORAL
  Filled 2017-03-20 (×8): qty 1

## 2017-03-20 MED ORDER — PIPERACILLIN-TAZOBACTAM 3.375 G IVPB 30 MIN
3.3750 g | Freq: Once | INTRAVENOUS | Status: AC
  Administered 2017-03-20: 3.375 g via INTRAVENOUS
  Filled 2017-03-20: qty 50

## 2017-03-20 MED ORDER — SODIUM CHLORIDE 0.9 % IV BOLUS (SEPSIS)
1000.0000 mL | Freq: Once | INTRAVENOUS | Status: AC
  Administered 2017-03-20: 1000 mL via INTRAVENOUS

## 2017-03-20 MED ORDER — ONDANSETRON HCL 4 MG PO TABS: 4.0000 mg | ORAL_TABLET | Freq: Four times a day (QID) | ORAL | Status: DC | PRN

## 2017-03-20 MED ORDER — CEFEPIME HCL 2 G IJ SOLR
2.0000 g | INTRAMUSCULAR | Status: DC
  Administered 2017-03-21 – 2017-03-23 (×3): 2 g via INTRAVENOUS
  Filled 2017-03-20 (×4): qty 2

## 2017-03-20 MED ORDER — SODIUM CHLORIDE 0.9 % IV BOLUS (SEPSIS): 750.0000 mL | Freq: Once | INTRAVENOUS | Status: DC

## 2017-03-20 MED ORDER — SODIUM CHLORIDE 0.9 % IV SOLN
INTRAVENOUS | Status: DC
  Administered 2017-03-20: 23:00:00 via INTRAVENOUS

## 2017-03-20 MED ORDER — INSULIN DETEMIR 100 UNIT/ML ~~LOC~~ SOLN
30.0000 [IU] | Freq: Two times a day (BID) | SUBCUTANEOUS | Status: DC
  Administered 2017-03-20 – 2017-03-23 (×6): 30 [IU] via SUBCUTANEOUS
  Filled 2017-03-20 (×7): qty 0.3

## 2017-03-20 MED ORDER — OMEGA-3-ACID ETHYL ESTERS 1 G PO CAPS
1.0000 g | ORAL_CAPSULE | Freq: Two times a day (BID) | ORAL | Status: DC
  Administered 2017-03-21 – 2017-03-24 (×7): 1 g via ORAL
  Filled 2017-03-20 (×7): qty 1

## 2017-03-20 MED ORDER — ONDANSETRON HCL 4 MG/2ML IJ SOLN: 4.0000 mg | Freq: Four times a day (QID) | INTRAMUSCULAR | Status: DC | PRN

## 2017-03-20 MED ORDER — CEFEPIME HCL 2 G IJ SOLR: 2.0000 g | Freq: Once | INTRAMUSCULAR | Status: DC

## 2017-03-20 NOTE — Progress Notes (Signed)
Pharmacy Antibiotic Note  Mark Chang is a 71 y.o. male admitted on 03/20/2017 with sepsis.  Pharmacy has been consulted for vancomycin/zosyn dosing. Afebrile, WBC is elevated at 16.2 and SCr is elevated at 1.61. Lactic acid is elevated a 3.32.   Plan: Vancomycin 2500mg  IV x 1 then 750mg  IV Q12H Zosyn 3.375g IV Q8H (4 hr inf) F/u renal fxn, C&S, clinical status and trough at SS  Height: 5\' 8"  (172.7 cm) Weight: 220 lb (99.8 kg) IBW/kg (Calculated) : 68.4  Temp (24hrs), Avg:97.7 F (36.5 C), Min:97.7 F (36.5 C), Max:97.7 F (36.5 C)  Recent Labs  Lab 03/20/17 1820 03/20/17 1828  WBC 16.2*  --   CREATININE 1.61*  --   LATICACIDVEN  --  3.32*    Estimated Creatinine Clearance: 47.5 mL/min (A) (by C-G formula based on SCr of 1.61 mg/dL (H)).    No Known Allergies  Salome Arnt, PharmD, BCPS 03/20/2017 8:28 PM

## 2017-03-20 NOTE — ED Notes (Signed)
Admitting at bedside 

## 2017-03-20 NOTE — ED Notes (Signed)
EDP notified of Lactic Acid

## 2017-03-20 NOTE — ED Notes (Signed)
Attempted report x1. 

## 2017-03-20 NOTE — ED Notes (Signed)
Called xray to make them aware patient ready

## 2017-03-20 NOTE — ED Provider Notes (Signed)
[place] EMERGENCY DEPARTMENT Provider Note   CSN: [id number] Arrival date & time: 03/20/17  1735     History   Chief Complaint Chief Complaint  Patient presents with  . Fever    HPI Mark Chang is a 71 y.o. male with a h/o of COPD, DM, HLD, HTN, mental retardation, and pernicious anemia who presents to the emergency department from [place] with a chief complaint of fever.  EMS reports that staff noted the patient to have a tactile fever, onset today and CBG of 285 this afternoon.  They also note they felt he appeared to be following commands less than his baseline since yesterday.  On EMS arrival, the patient was febrile to 102, tachycardic in the 130s with sinus rhythm on EKG, and BP 192/110.  He was complaining of nausea, but no other complaints at that time.  He was given a 1 L bolus of IV fluids, 4 mg of IV Zofran, and 1000 mg of Tylenol en route, which resolved his fever, improved his HR 110s, and BP from 178/90 en route to 126/62 on arrival in the ED. SaO2 maintained in the mid-90s on RA.   In the ED, he complains of constant dyspnea that began "over the last few days".  No aggravating or alleviating factors.  No treatment prior to arrival.  He denies chest pain, nausea, vomiting, diarrhea, abdominal pain, rash, dizziness, lightheadedness, headache, neck pain, palpitations, swelling in his legs, or dysuria.   The history is provided by the patient and the EMS personnel. No language interpreter was used.  Fever   This is a new problem. The current episode started 6 to 12 hours ago. The problem occurs constantly. The problem has been resolved. The maximum temperature noted was 102 to 102.9 F. The temperature was taken using an oral thermometer. Pertinent negatives include no chest pain, no fussiness, no diarrhea, no vomiting, no headaches and no sore throat. He has tried acetaminophen for the symptoms. The treatment provided significant  relief.    Past Medical History:  Diagnosis Date  . COPD (chronic obstructive pulmonary disease) ([place])   . Diabetes mellitus without complication ([place])   . Hyperlipidemia   . Hypertension   . Mental retardation   . Obesity   . Pernicious anemia     Patient Active Problem List   Diagnosis Date Noted  . Mental retardation 03/20/2017  . Hypertension 03/20/2017  . AKI (acute kidney injury) ([place]) 03/20/2017  . Sepsis due to undetermined organism ([place]) 03/20/2017  . Pernicious anemia 03/20/2017  . Diabetes mellitus without complication ([place]) 02/58/5277  . Acute respiratory failure ([place])   . Encephalopathy 07/18/2016    History reviewed. No pertinent surgical history.     Home Medications    Prior to Admission medications   Medication Sig Start Date End Date Taking? Authorizing Provider  acetaminophen (TYLENOL) 500 MG tablet Take 500 mg by mouth 3 (three) times daily.    Yes [provider]  Artificial Tear Solution (TEARS AGAIN) SOLN Place 1 drop into both eyes 2 (two) times daily as needed (dry eyes).   Yes [provider]  atorvastatin (LIPITOR) 20 MG tablet Take 20 mg by mouth at bedtime.    Yes [provider]  docusate sodium (COLACE) 100 MG capsule Take 100 mg by mouth 2 (two) times daily.   Yes [provider]  Docusate Sodium (DIOCTO) 150 MG/15ML syrup 10 mg See admin instructions. Instil 1 ml (10 mg)  into each ear and wait 10 minutes, then irrigate with warm water - repeat every month   Yes [provider]  escitalopram (LEXAPRO) 10 MG tablet Take 10 mg by mouth daily.   Yes [provider]  furosemide (LASIX) 40 MG tablet Take 40 mg by mouth daily.   Yes [provider]  glipiZIDE (GLUCOTROL XL) 5 MG 24 hr tablet Take 5 mg by mouth daily.   Yes [provider]  insulin aspart (NOVOLOG) 100 UNIT/ML injection CBG 70 - 120: 0 units CBG 121 - 150: 2 units CBG 151 - 200: 3 units CBG 201 - 250: 5  units CBG 251 - 300: 8 units CBG 301 - 350: 11 units CBG 351 - 400: 15 units Patient taking differently: Inject 3 Units into the skin See admin instructions. Inject 3 units subcutaneously three times daily; Hold if BS <100 or if not eating; inject 5 units three times daily as needed if BS >450/ recheck in 1 hour 07/23/16  Yes Hosie Poisson, MD  Insulin Detemir (LEVEMIR FLEXTOUCH) 100 UNIT/ML Pen Inject 30-40 Units into the skin See admin instructions. Inject 40 units subcutaneously every morning and 30 units at bedtime   Yes [provider]  linagliptin (TRADJENTA) 5 MG TABS tablet Take 5 mg by mouth daily.   Yes [provider]  lisinopril (PRINIVIL,ZESTRIL) 40 MG tablet Take 40 mg by mouth daily.    Yes [provider]  Melatonin 5 MG CAPS Take 10 mg by mouth at bedtime.    Yes [provider]  omega-3 acid ethyl esters (LOVAZA) 1 g capsule Take 1 g by mouth 2 (two) times daily.   Yes [provider]  potassium chloride SA (K-DUR,KLOR-CON) 20 MEQ tablet Take 20 mEq by mouth daily.   Yes [provider]  ranitidine (ZANTAC) 150 MG tablet Take 150 mg by mouth 2 (two) times daily.   Yes [provider]  vitamin B-12 (CYANOCOBALAMIN) 1000 MCG tablet Take 1,000 mcg by mouth daily.   Yes [provider]  mupirocin ointment (BACTROBAN) 2 % Place into the nose 2 (two) times daily. Patient not taking: Reported on 03/20/2017 07/23/16   Hosie Poisson, MD    Family History History reviewed. No pertinent family history.  Social History Social History   Tobacco Use  . Smoking status: Not on file  . Smokeless tobacco: Never Used  Substance Use Topics  . Alcohol use: No  . Drug use: Not on file     Allergies   Patient has no known allergies.   Review of Systems Review of Systems  Constitutional: Positive for fever.  HENT: Negative for sore throat.   Eyes: Negative for visual disturbance.  Respiratory: Positive for  shortness of breath.   Cardiovascular: Negative for chest pain.  Gastrointestinal: Negative for diarrhea and vomiting.  Genitourinary: Negative for dysuria.  Musculoskeletal: Negative for back pain.  Skin: Negative for rash.  Allergic/Immunologic: Positive for immunocompromised state.  Neurological: Negative for dizziness, syncope, light-headedness and headaches.  Psychiatric/Behavioral: Negative for confusion.     Physical Exam Updated Vital Signs BP 132/67   Pulse (!) 109   Temp 97.7 F (36.5 C) (Oral)   Resp (!) 23   Ht 5\' 8"  (1.727 m)   Wt 99.8 kg (220 lb)   SpO2 97%   BMI 33.45 kg/m   Physical Exam  Constitutional: He appears well-developed. No distress.  Obese male.  HENT:  Head: Normocephalic.  Eyes: Conjunctivae are normal.  Neck: Normal range of motion. Neck supple. No JVD present.  No meningeal signs.  Cardiovascular: Regular rhythm, normal heart sounds and intact distal pulses. Tachycardia present. Exam reveals no gallop and no friction rub.  No murmur heard. Pulmonary/Chest: Effort normal. No stridor. No respiratory distress. He has no wheezes. He has no rales. He exhibits no tenderness.  Abdominal: Soft. Bowel sounds are normal. He exhibits distension. He exhibits no mass. There is no tenderness. There is no rebound and no guarding. No hernia.  Protuberant abdomen.  No peritoneal signs.  Musculoskeletal: Normal range of motion. He exhibits no edema, tenderness or deformity.  No lower extremity edema.  Neurological: He is alert.  Skin: Skin is warm and dry. Capillary refill takes less than 2 seconds. No rash noted. He is not diaphoretic.  Psychiatric: His behavior is normal.  Nursing note and vitals reviewed.  ED Treatments / Results  Labs (all labs ordered are listed, but only abnormal results are displayed) Labs Reviewed  COMPREHENSIVE METABOLIC PANEL - Abnormal; Notable for the following components:      Result Value   Sodium 133 (*)    Chloride 100  (*)    CO2 21 (*)    Glucose, Bld 372 (*)    BUN 28 (*)    Creatinine, Ser 1.61 (*)    Calcium 8.5 (*)    Albumin 3.1 (*)    GFR calc non Af Amer 41 (*)    GFR calc Af Amer 48 (*)    All other components within normal limits  CBC WITH DIFFERENTIAL/PLATELET - Abnormal; Notable for the following components:   WBC 16.2 (*)    Hemoglobin 12.3 (*)    HCT 37.1 (*)    Neutro Abs 14.4 (*)    Lymphs Abs 0.6 (*)    Monocytes Absolute 1.2 (*)    All other components within normal limits  I-STAT CG4 LACTIC ACID, ED - Abnormal; Notable for the following components:   Lactic Acid, Venous 3.32 (*)    All other components within normal limits  CULTURE, BLOOD (ROUTINE X 2)  CULTURE, BLOOD (ROUTINE X 2)  URINE CULTURE  URINALYSIS, ROUTINE W REFLEX MICROSCOPIC  I-STAT CG4 LACTIC ACID, ED    EKG  EKG Interpretation  Date/Time:  Saturday March 20 2017 18:34:23 EST Ventricular Rate:  115 PR Interval:    QRS Duration: 79 QT Interval:  339 QTC Calculation: 469 R Axis:   -6 Text Interpretation:  Sinus tachycardia Low voltage, extremity and precordial leads Anteroseptal infarct, old Nonspecific T abnormalities, lateral leads No prior ECG for comparison.  No STEMI Confirmed by Antony Blackbird [phone]) on 03/20/2017 7:06:45 PM       Radiology Dg Chest 2 View  Result Date: 03/20/2017 CLINICAL DATA:  Initial evaluation for acute fever, altered mental status. EXAM: CHEST  2 VIEW COMPARISON:  Prior radiograph from 07/22/2016. FINDINGS: Mild cardiomegaly, stable.  Mediastinal silhouette normal. Lungs hypoinflated. Mild patchy left basilar opacity, favored to reflect atelectasis, although infiltrate could be considered in the correct clinical setting. No other focal airspace disease. No pulmonary edema or pleural effusion. No pneumothorax. Osseous structures within normal limits. IMPRESSION: Shallow lung inflation with mild patchy left left lower lobe opacity. Atelectasis is favored. Infectious  infiltrate could be considered in the correct clinical setting. Electronically Signed   By: Jeannine Boga M.D.   On: 03/20/2017 19:29    Procedures Procedures (including critical care time)  Medications Ordered in ED Medications  vancomycin (VANCOCIN) 2,500 mg in  sodium chloride 0.9 % 500 mL IVPB (not administered)  piperacillin-tazobactam (ZOSYN) IVPB 3.375 g (not administered)  vancomycin (VANCOCIN) IVPB 750 mg/150 ml premix (not administered)  sodium chloride 0.9 % bolus 1,000 mL (0 mLs Intravenous Stopped 03/20/17 1946)  piperacillin-tazobactam (ZOSYN) IVPB 3.375 g (0 g Intravenous Stopped 03/20/17 1946)     Initial Impression / Assessment and Plan / ED Course  I have reviewed the triage vital signs and the nursing notes.  Pertinent labs & imaging results that were available during my care of the patient were reviewed by me and considered in my medical decision making (see chart for details).     71 year old male with a h/o of COPD, DM, HLD, HTN, mental retardation, and pernicious anemia who presents to the emergency department from [place] with a chief complaint of fever and dyspnea.  Code sepsis initiated.  He is afebrile in the ED after Tylenol administration by EMS.  1 L fluid bolus given by EMS and a second in the ED with improvement of HR from 130s to 110s. Initial lactate 3.32.  Leukocytosis of 16, with an absolute neutrophil count of 14.4.  CXR with mild patchy opacity in the left lower lobe, concerning for atelectasis versus infectious infiltrate. Blood cultures x2 ordered prior to initiation of empiric vancomycin and Zosyn. Cr 1.6, up from the patient's baseline of 1.0-1.2. Glu 372; anion gap 12 and bicarb 21.  Workup concerning for sepsis secondary to HCAP.  The patient was seen and evaluated with Dr. Sherry Ruffing, attending physician who agrees with workup and plan.  Spoke with Dr. Myna Hidalgo with the hospitalist team who will admit the patient for continued  evaluation and workup. The patient appears reasonably stabilized for admission considering the current resources, flow, and capabilities available in the ED at this time, and I doubt any other [facility] requiring further screening and/or treatment in the ED prior to admission.  Final Clinical Impressions(s) / ED Diagnoses   Final diagnoses:  Healthcare-associated pneumonia    ED Discharge Orders    None       Joanne Gavel, PA-C 03/20/17 2059    Tegeler, Gwenyth Allegra, MD 03/22/17 [phone]

## 2017-03-20 NOTE — ED Provider Notes (Signed)
Patient with a h/o of MR brought in via EMS from [facility] [address].  After staff called because the patient felt warm to the touch, with following commands less than baseline for the last day, and CBG of 285 at the facility.   On arrival at facility, febrile to 102, improving to 100.8 after administration of 1000 mg of Tylenol by EMS.  EKG with heart rate in the 130s with sinus rhythm.  BP 192/110 on arrival, 178/90 in route, improving to 126/62 in the ED.  SaO2 in the mid 90s.  He also endorsed nausea, and was given IV Zofran 4 mg in route, with improvement.  He has a 20-gauge in his left hand.  No insulin administered today.  No pain at this time.   Joanne Gavel, PA-C 03/20/17 1748    Tegeler, Gwenyth Allegra, MD 03/20/17 Carollee Massed

## 2017-03-20 NOTE — ED Triage Notes (Signed)
PA obtained report from EMS, please see Joline Maxcy, PA, note on 03/20/17 at 1745.

## 2017-03-20 NOTE — Progress Notes (Signed)
Pharmacy Antibiotic Note  Mark Chang is a 71 y.o. male admitted on 03/20/2017 with sepsis.  Pharmacy has been consulted for vancomycin/zosyn dosing. Afebrile, labs pending.  Plan: Vancomycin 2500mg  IV x 1 Zosyn 3.375g IV (44min infusion) x1 Monitor clinical progress, c/s, renal function F/u de-escalation plan/LOT, vancomycin trough as indicated F/u SCr for abx maintenance doses   Height: 5\' 8"  (172.7 cm) Weight: 220 lb (99.8 kg) IBW/kg (Calculated) : 68.4  Temp (24hrs), Avg:97.7 F (36.5 C), Min:97.7 F (36.5 C), Max:97.7 F (36.5 C)  No results for input(s): WBC, CREATININE, LATICACIDVEN, VANCOTROUGH, VANCOPEAK, VANCORANDOM, GENTTROUGH, GENTPEAK, GENTRANDOM, TOBRATROUGH, TOBRAPEAK, TOBRARND, AMIKACINPEAK, AMIKACINTROU, AMIKACIN in the last 168 hours.  CrCl cannot be calculated (Patient's most recent lab result is older than the maximum 21 days allowed.).    No Known Allergies  Elicia Lamp, PharmD, BCPS Clinical Pharmacist 03/20/2017 6:35 PM

## 2017-03-20 NOTE — Progress Notes (Signed)
Pharmacy Antibiotic Note  Mark Chang is a 71 y.o. male admitted on 03/20/2017 with sepsis.  Pharmacy has been consulted for vancomycin/zosyn dosing. Afebrile, WBC is elevated at 16.2 and SCr is elevated at 1.61. Lactic acid is elevated a 3.32.   Plan: Vancomycin 2500mg  IV x 1 then 750mg  IV Q12H Zosyn 3.375g IV Q8H (4 hr inf) F/u renal fxn, C&S, clinical status and trough at [facility]  ADDENDUM: Pharmacy consulted to start cefepime. Zosyn d/c'd.  Plan: Cefepime 2g IV q24h Continue vancomycin 750mg  IV q12h Monitor clinical progress, c/s, renal function F/u de-escalation plan/LOT, vancomycin trough as indicated   Height: 5\' 8"  (172.7 cm) Weight: 220 lb (99.8 kg) IBW/kg (Calculated) : 68.4  Temp (24hrs), Avg:97.7 F (36.5 C), Min:97.7 F (36.5 C), Max:97.7 F (36.5 C)  Recent Labs  Lab 03/20/17 1820 03/20/17 1828 03/20/17 2110  WBC 16.2*  --   --   CREATININE 1.61*  --   --   LATICACIDVEN  --  3.32* 4.01*    Estimated Creatinine Clearance: 47.5 mL/min (A) (by C-G formula based on SCr of 1.61 mg/dL (H)).    No Known Allergies  Elicia Lamp, PharmD, BCPS Clinical Pharmacist 03/20/2017 9:29 PM

## 2017-03-20 NOTE — ED Notes (Signed)
Dr Sherry Ruffing given a copy of lactic acid 4.01

## 2017-03-20 NOTE — H&P (Signed)
History and Physical    Mark Chang KWI:[id number] DOB: Nov 20, 1945 DOA: 03/20/2017  PCP: Terrill Mohr, NP   Patient coming from: Nursing home  Chief Complaint: Lethargy, fever   HPI: Mark Chang is a 71 y.o. male with medical history significant for insulin-dependent diabetes mellitus, hypertension, and mental retardation, presenting from his nursing facility for evaluation lethargy and fever.  Patient was noted to be less active and less engaged today at his nursing facility and seemed to be warm to the touch.  Temperature of 102 F was documented and EMS was called out for evaluation.  Patient reported some mild dyspnea and cough, denies abdominal pain, vomiting, or diarrhea.  Denies chest pain or headache.  He was treated with 1 g of Tylenol by EMS and brought into the ED for evaluation.  ED Course: Upon arrival to the ED, patient is found to be afebrile, saturating well on room air, tachycardic in the 110s, and with stable blood pressure.  EKG features a sinus tachycardia with rate 115 and chest x-ray is notable for shallow inflation and patchy left lower lobe opacity suggestive of atelectasis, or possibly infection.  Chemistry panel is notable for sodium of 133, glucose 372, and creatinine of 1.61, up from 1.20 earlier this year.  CBC is notable for leukocytosis to 16,200 and a stable normocytic anemia with hemoglobin of 12.3.  Lactic acid is elevated to 3.32.  Blood cultures were collected, 1 L of normal saline was administered, and the patient was treated with vancomycin and Zosyn in the ED.  Urinalysis is ordered and remain at this time.  Patient remains hemodynamically stable, has not been in any significant respiratory distress, and will be admitted to the medical-surgical unit for ongoing evaluation and sepsis, possibly secondary to a pneumonia, though with urinalysis still pending.  Review of Systems:  All other systems reviewed and apart from HPI, are negative.  Past Medical  History:  Diagnosis Date  . COPD (chronic obstructive pulmonary disease) ([place])   . Diabetes mellitus without complication ([place])   . Hyperlipidemia   . Hypertension   . Mental retardation   . Obesity   . Pernicious anemia     History reviewed. No pertinent surgical history.   does not have a smoking history on file. he has never used smokeless tobacco. He reports that he does not drink alcohol. His drug history is not on file.  No Known Allergies  History reviewed. No pertinent family history.   Prior to Admission medications   Medication Sig Start Date End Date Taking? Authorizing Provider  acetaminophen (TYLENOL) 500 MG tablet Take 500 mg by mouth 3 (three) times daily.    Yes [provider]  Artificial Tear Solution (TEARS AGAIN) SOLN Place 1 drop into both eyes 2 (two) times daily as needed (dry eyes).   Yes [provider]  atorvastatin (LIPITOR) 20 MG tablet Take 20 mg by mouth at bedtime.    Yes [provider]  docusate sodium (COLACE) 100 MG capsule Take 100 mg by mouth 2 (two) times daily.   Yes [provider]  Docusate Sodium (DIOCTO) 150 MG/15ML syrup 10 mg See admin instructions. Instil 1 ml (10 mg) into each ear and wait 10 minutes, then irrigate with warm water - repeat every month   Yes [provider]  escitalopram (LEXAPRO) 10 MG tablet Take 10 mg by mouth daily.   Yes [provider]  furosemide (LASIX) 40 MG tablet Take 40 mg by mouth  daily.   Yes [provider]  glipiZIDE (GLUCOTROL XL) 5 MG 24 hr tablet Take 5 mg by mouth daily.   Yes [provider]  insulin aspart (NOVOLOG) 100 UNIT/ML injection CBG 70 - 120: 0 units CBG 121 - 150: 2 units CBG 151 - 200: 3 units CBG 201 - 250: 5 units CBG 251 - 300: 8 units CBG 301 - 350: 11 units CBG 351 - 400: 15 units Patient taking differently: Inject 3 Units into the skin See admin instructions. Inject 3 units subcutaneously three times daily;  Hold if BS <100 or if not eating; inject 5 units three times daily as needed if BS >450/ recheck in 1 hour 07/23/16  Yes Hosie Poisson, MD  Insulin Detemir (LEVEMIR FLEXTOUCH) 100 UNIT/ML Pen Inject 30-40 Units into the skin See admin instructions. Inject 40 units subcutaneously every morning and 30 units at bedtime   Yes [provider]  linagliptin (TRADJENTA) 5 MG TABS tablet Take 5 mg by mouth daily.   Yes [provider]  lisinopril (PRINIVIL,ZESTRIL) 40 MG tablet Take 40 mg by mouth daily.    Yes [provider]  Melatonin 5 MG CAPS Take 10 mg by mouth at bedtime.    Yes [provider]  omega-3 acid ethyl esters (LOVAZA) 1 g capsule Take 1 g by mouth 2 (two) times daily.   Yes [provider]  potassium chloride SA (K-DUR,KLOR-CON) 20 MEQ tablet Take 20 mEq by mouth daily.   Yes [provider]  ranitidine (ZANTAC) 150 MG tablet Take 150 mg by mouth 2 (two) times daily.   Yes [provider]  vitamin B-12 (CYANOCOBALAMIN) 1000 MCG tablet Take 1,000 mcg by mouth daily.   Yes [provider]    Physical Exam: Vitals:   03/20/17 1742 03/20/17 1945 03/20/17 2015  BP: 126/68 128/70 132/67  Pulse: (!) 117 (!) 105 (!) 109  Resp: 20 19 (!) 23  Temp: 97.7 F (36.5 C)    TempSrc: Oral    SpO2: 96% 98% 97%  Weight: 99.8 kg (220 lb)    Height: 5\' 8"  (1.727 m)        Constitutional: NAD, calm, obese  Eyes: PERTLA, lids and conjunctivae normal ENMT: Mucous membranes are moist. Posterior pharynx clear of any exudate or lesions.   Neck: normal, supple, no masses, no thyromegaly Respiratory: Diminished bilaterally, occasional dry cough, no wheezing, no crackles. Normal respiratory effort.    Cardiovascular: Rate ~110 and regular. No extremity edema.   Abdomen: No distension, no tenderness, no masses palpated. Bowel sounds normal.  Musculoskeletal: no clubbing / cyanosis. No joint deformity upper and lower extremities.    Skin: no significant rashes, lesions, ulcers. Warm, dry, well-perfused. Neurologic: CN 2-12 grossly intact. Sensation intact. Strength 5/5 in all 4 limbs.  Psychiatric: Alert and oriented x 3. Pleasant, cooperative.     Labs on Admission: I have personally reviewed following labs and imaging studies  CBC: Recent Labs  Lab 03/20/17 1820  WBC 16.2*  NEUTROABS 14.4*  HGB 12.3*  HCT 37.1*  MCV 87.7  PLT 161   Basic Metabolic Panel: Recent Labs  Lab 03/20/17 1820  NA 133*  K 3.7  CL 100*  CO2 21*  GLUCOSE 372*  BUN 28*  CREATININE 1.61*  CALCIUM 8.5*   GFR: Estimated Creatinine Clearance: 47.5 mL/min (A) (by C-G formula based on SCr of 1.61 mg/dL (H)). Liver Function Tests: Recent Labs  Lab 03/20/17 1820  AST 29  ALT  22  ALKPHOS 52  BILITOT 1.0  PROT 7.2  ALBUMIN 3.1*   No results for input(s): LIPASE, AMYLASE in the last 168 hours. No results for input(s): AMMONIA in the last 168 hours. Coagulation Profile: No results for input(s): INR, PROTIME in the last 168 hours. Cardiac Enzymes: No results for input(s): CKTOTAL, CKMB, CKMBINDEX, TROPONINI in the last 168 hours. BNP (last 3 results) No results for input(s): PROBNP in the last 8760 hours. HbA1C: No results for input(s): HGBA1C in the last 72 hours. CBG: No results for input(s): GLUCAP in the last 168 hours. Lipid Profile: No results for input(s): CHOL, HDL, LDLCALC, TRIG, CHOLHDL, LDLDIRECT in the last 72 hours. Thyroid Function Tests: No results for input(s): TSH, T4TOTAL, FREET4, T3FREE, THYROIDAB in the last 72 hours. Anemia Panel: No results for input(s): VITAMINB12, FOLATE, FERRITIN, TIBC, IRON, RETICCTPCT in the last 72 hours. Urine analysis:    Component Value Date/Time   COLORURINE YELLOW 07/19/2016 1139   APPEARANCEUR CLEAR 07/19/2016 1139   LABSPEC 1.019 07/19/2016 1139   PHURINE 5.0 07/19/2016 1139   GLUCOSEU NEGATIVE 07/19/2016 1139   HGBUR NEGATIVE 07/19/2016 1139   BILIRUBINUR  NEGATIVE 07/19/2016 1139   KETONESUR NEGATIVE 07/19/2016 1139   PROTEINUR 100 (A) 07/19/2016 1139   NITRITE NEGATIVE 07/19/2016 1139   LEUKOCYTESUR NEGATIVE 07/19/2016 1139   Sepsis Labs: @LABRCNTIP (procalcitonin:4,lacticidven:4) )No results found for this or any previous visit (from the past 240 hour(s)).   Radiological Exams on Admission: Dg Chest 2 View  Result Date: 03/20/2017 CLINICAL DATA:  Initial evaluation for acute fever, altered mental status. EXAM: CHEST  2 VIEW COMPARISON:  Prior radiograph from 07/22/2016. FINDINGS: Mild cardiomegaly, stable.  Mediastinal silhouette normal. Lungs hypoinflated. Mild patchy left basilar opacity, favored to reflect atelectasis, although infiltrate could be considered in the correct clinical setting. No other focal airspace disease. No pulmonary edema or pleural effusion. No pneumothorax. Osseous structures within normal limits. IMPRESSION: Shallow lung inflation with mild patchy left left lower lobe opacity. Atelectasis is favored. Infectious infiltrate could be considered in the correct clinical setting. Electronically Signed   By: Jeannine Boga M.D.   On: 03/20/2017 19:29    EKG: Independently reviewed. Sinus tachycardia (rate 115).   Assessment/Plan  1. Sepsis   - Pt presents with fever at nursing home, treated by EMS prior to arrival, as well as lethargy; he reports mild SOB and cough  - Noted to have leukocytosis and elevated lactate; CXR with LLL atelectasis vs PNA  - Treated in ED with NS bolus and vancomycin plus Zosyn after blood cultures were collected   - Plan to check UA, culture sputum, check strep pneumo urine antigen, trend lactic acid and procalcitonin, continue empiric abx with vancomycin and cefepime    2. Acute kidney injury  - SCr is 1.60 on admission, up from priors in 1.1-1.2 range  - Suspect an acute prerenal azotemia in setting of acute infection with clinical hypovolemia  - Treated with NS by EMS pta, and given  1 liter NS in ED  - Continue gentle IVF hydration, hold lisinopril and Lasix, repeat chem panel in am    3. Insulin-dependent DM  - No A1c on file; serum glucose 372 on admission  - Managed at the nursing facility with glipizide, Tradjenta, Levemir 40 units in am and 30 qPM, and Novolog per sliding-scale  - Check CBG's, continue Levemir, continue SSI with Novlog    4. Hypertension  - BP at goal  - Lisinopril held in light of  AKI     DVT prophylaxis: sq heparin Code Status: Full  Family Communication: Discussed with patient Disposition Plan: Admit to med-surg Consults called: None Admission status: Inpatient    Vianne Bulls, MD [facility] Pager [phone]  If 7PM-7AM, please contact night-coverage [email or web address] Password [facility]  03/20/2017, 9:28 PM

## 2017-03-21 ENCOUNTER — Encounter (HOSPITAL_COMMUNITY): Payer: Self-pay

## 2017-03-21 LAB — CBC WITH DIFFERENTIAL/PLATELET
BASOS ABS: 0 10*3/uL (ref 0.0–0.1)
Basophils Relative: 0 %
EOS PCT: 0 %
Eosinophils Absolute: 0 10*3/uL (ref 0.0–0.7)
HEMATOCRIT: 37.2 % — AB (ref 39.0–52.0)
Hemoglobin: 12 g/dL — ABNORMAL LOW (ref 13.0–17.0)
LYMPHS ABS: 0.8 10*3/uL (ref 0.7–4.0)
LYMPHS PCT: 5 %
MCH: 28.6 pg (ref 26.0–34.0)
MCHC: 32.3 g/dL (ref 30.0–36.0)
MCV: 88.6 fL (ref 78.0–100.0)
MONO ABS: 0.8 10*3/uL (ref 0.1–1.0)
Monocytes Relative: 5 %
NEUTROS ABS: 14.2 10*3/uL — AB (ref 1.7–7.7)
Neutrophils Relative %: 90 %
Platelets: 177 10*3/uL (ref 150–400)
RBC: 4.2 MIL/uL — AB (ref 4.22–5.81)
RDW: 14 % (ref 11.5–15.5)
WBC: 15.8 10*3/uL — ABNORMAL HIGH (ref 4.0–10.5)

## 2017-03-21 LAB — BASIC METABOLIC PANEL
ANION GAP: 10 (ref 5–15)
BUN: 26 mg/dL — AB (ref 6–20)
CHLORIDE: 104 mmol/L (ref 101–111)
CO2: 23 mmol/L (ref 22–32)
Calcium: 8 mg/dL — ABNORMAL LOW (ref 8.9–10.3)
Creatinine, Ser: 1.55 mg/dL — ABNORMAL HIGH (ref 0.61–1.24)
GFR calc Af Amer: 50 mL/min — ABNORMAL LOW (ref >60)
GFR, EST NON AFRICAN AMERICAN: 43 mL/min — AB (ref >60)
GLUCOSE: 322 mg/dL — AB (ref 65–99)
POTASSIUM: 3.7 mmol/L (ref 3.5–5.1)
Sodium: 137 mmol/L (ref 135–145)

## 2017-03-21 LAB — BLOOD CULTURE ID PANEL (REFLEXED)
ACINETOBACTER BAUMANNII: NOT DETECTED
CANDIDA ALBICANS: NOT DETECTED
CANDIDA KRUSEI: NOT DETECTED
CANDIDA PARAPSILOSIS: NOT DETECTED
CANDIDA TROPICALIS: NOT DETECTED
CARBAPENEM RESISTANCE: NOT DETECTED
Candida glabrata: NOT DETECTED
ENTEROBACTER CLOACAE COMPLEX: NOT DETECTED
Enterobacteriaceae species: DETECTED — AB
Enterococcus species: NOT DETECTED
Escherichia coli: DETECTED — AB
HAEMOPHILUS INFLUENZAE: NOT DETECTED
KLEBSIELLA OXYTOCA: NOT DETECTED
KLEBSIELLA PNEUMONIAE: NOT DETECTED
Listeria monocytogenes: NOT DETECTED
Neisseria meningitidis: NOT DETECTED
PROTEUS SPECIES: NOT DETECTED
PSEUDOMONAS AERUGINOSA: NOT DETECTED
STREPTOCOCCUS PYOGENES: NOT DETECTED
STREPTOCOCCUS SPECIES: NOT DETECTED
Serratia marcescens: NOT DETECTED
Staphylococcus aureus (BCID): NOT DETECTED
Staphylococcus species: NOT DETECTED
Streptococcus agalactiae: NOT DETECTED
Streptococcus pneumoniae: NOT DETECTED

## 2017-03-21 LAB — STREP PNEUMONIAE URINARY ANTIGEN: Strep Pneumo Urinary Antigen: NEGATIVE

## 2017-03-21 LAB — GLUCOSE, CAPILLARY
GLUCOSE-CAPILLARY: 208 mg/dL — AB (ref 65–99)
GLUCOSE-CAPILLARY: 171 mg/dL — AB (ref 65–99)
Glucose-Capillary: 228 mg/dL — ABNORMAL HIGH (ref 65–99)
Glucose-Capillary: 200 mg/dL — ABNORMAL HIGH (ref 65–99)

## 2017-03-21 LAB — MRSA PCR SCREENING: MRSA BY PCR: POSITIVE — AB

## 2017-03-21 LAB — LACTIC ACID, PLASMA: LACTIC ACID, VENOUS: 2.4 mmol/L — AB (ref 0.5–1.9)

## 2017-03-21 MED ORDER — MUPIROCIN 2 % EX OINT
1.0000 "application " | TOPICAL_OINTMENT | Freq: Two times a day (BID) | CUTANEOUS | Status: DC
  Administered 2017-03-21 – 2017-03-24 (×7): 1 via NASAL
  Filled 2017-03-21 (×3): qty 22

## 2017-03-21 MED ORDER — SODIUM CHLORIDE 0.9 % IV SOLN
INTRAVENOUS | Status: DC
  Administered 2017-03-21 – 2017-03-23 (×5): via INTRAVENOUS

## 2017-03-21 MED ORDER — CHLORHEXIDINE GLUCONATE CLOTH 2 % EX PADS
6.0000 | MEDICATED_PAD | Freq: Every day | CUTANEOUS | Status: DC
  Administered 2017-03-21 – 2017-03-24 (×4): 6 via TOPICAL

## 2017-03-21 NOTE — Progress Notes (Signed)
[facility] PROGRESS NOTE  Mark Chang MEQ:[id number] DOB: May 24, 1945 DOA: 03/20/2017 PCP: Terrill Mohr, NP  Brief summary   71 y.o. male with medical history significant for insulin-dependent diabetes mellitus, hypertension, and mental retardation, presenting from his nursing facility for evaluation lethargy and fever.  Patient was noted to be less active and less engaged at his nursing facility and seemed to be warm to the touch.  Temperature of 102 F was documented and EMS was called out for evaluation.  Patient reported some mild dyspnea and cough, denies abdominal pain, vomiting, or diarrhea.  Denies chest pain or headache.  He was treated with 1 g of Tylenol by EMS and brought into the ED for evaluation.  ED Course: Upon arrival to the ED, patient is found to be afebrile, saturating well on room air, tachycardic in the 110s, and with stable blood pressure.  EKG features a sinus tachycardia with rate 115 and chest x-ray is notable for shallow inflation and patchy left lower lobe opacity suggestive of atelectasis, or possibly infection.  Chemistry panel is notable for sodium of 133, glucose 372, and creatinine of 1.61, up from 1.20 earlier this year.  CBC is notable for leukocytosis to 16,200 and a stable normocytic anemia with hemoglobin of 12.3.  Lactic acid is elevated to 3.32.  Blood cultures were collected, 1 L of normal saline was administered, and the patient was treated with vancomycin and Zosyn in the ED.     Assessment/Plan:  Sepsis due to pneumonia and UTI. On admission: Lethargy, leukocytosis and elevated lactate; CXR with LLL atelectasis vs PNA. + UTI. Treated in ED with NS bolus and vancomycin plus Zosyn after blood cultures were collected   - febrile overnight. hemodynamically stable. Lactic acid trending down. Lethargy->resolved. will cont iv antibiotics. Pend cultures. pend strep pneumo urine antigen  Acute kidney injury. SCr is 1.60 on admission, up from priors in  1.1-1.2 range. Suspect an acute prerenal azotemia in setting of acute infection with clinical hypovolemia -cont iv fluids. Monitor urine output, I/O. Labs   Insulin-dependent DM. No A1c on file; serum glucose 372 on admission. Managed at the nursing facility with glipizide, Tradjenta, Levemir 40 units in am and 30 qPM, and Novolog per sliding-scale  - continue Levemir, continue SSI with Novlog. Titrate as needed  Hypertension. Stable. Lisinopril held in light of AKI      Code Status: full Family Communication: d/w patient, RN (indicate person spoken with, relationship, and if by phone, the number) Disposition Plan: pend clinical improvement. Obtain pt    Consultants:  none  Procedures:  none  Antibiotics: Antibiotics Given (last 72 hours)    Date/Time Action Medication Dose Rate   03/20/17 1854 New Bag/Given   piperacillin-tazobactam (ZOSYN) IVPB 3.375 g 3.375 g 100 mL/hr   03/20/17 2138 New Bag/Given   vancomycin (VANCOCIN) 2,500 mg in sodium chloride 0.9 % 500 mL IVPB 2,500 mg 250 mL/hr        (indicate start date, and stop date if known)  HPI/Subjective: Alert. Reports feeling well. +cough, productive. No acute chest pains   Objective: Vitals:   03/21/17 0032 03/21/17 0514  BP:  (!) 133/57  Pulse:  (!) 101  Resp:  17  Temp: (!) 102.7 F (39.3 C) 98.2 F (36.8 C)  SpO2:  93%    Intake/Output Summary (Last 24 hours) at 03/21/2017 0848 Last data filed at 03/21/2017 0529 Gross per 24 hour  Intake 1520 ml  Output 1000 ml  Net 520 ml  Filed Weights   03/20/17 2243 03/21/17 0032 03/21/17 0514  Weight: 118.4 kg (261 lb 0.4 oz) 118.4 kg (261 lb 0.4 oz) 118.9 kg (262 lb 2 oz)    Exam:   General:  No distress  Cardiovascular: s1,s2 rrr  Respiratory: few rales LL  Abdomen: soft, nt, nd   Musculoskeletal: no leg edema    Data Reviewed: Basic Metabolic Panel: Recent Labs  Lab 03/20/17 1820 03/21/17 0008  NA 133* 137  K 3.7 3.7  CL 100* 104   CO2 21* 23  GLUCOSE 372* 322*  BUN 28* 26*  CREATININE 1.61* 1.55*  CALCIUM 8.5* 8.0*   Liver Function Tests: Recent Labs  Lab 03/20/17 1820  AST 29  ALT 22  ALKPHOS 52  BILITOT 1.0  PROT 7.2  ALBUMIN 3.1*   No results for input(s): LIPASE, AMYLASE in the last 168 hours. No results for input(s): AMMONIA in the last 168 hours. CBC: Recent Labs  Lab 03/20/17 1820 03/21/17 0008  WBC 16.2* 15.8*  NEUTROABS 14.4* 14.2*  HGB 12.3* 12.0*  HCT 37.1* 37.2*  MCV 87.7 88.6  PLT 168 177   Cardiac Enzymes: No results for input(s): CKTOTAL, CKMB, CKMBINDEX, TROPONINI in the last 168 hours. BNP (last 3 results) No results for input(s): BNP in the last 8760 hours.  ProBNP (last 3 results) No results for input(s): PROBNP in the last 8760 hours.  CBG: Recent Labs  Lab 03/20/17 2244 03/21/17 0824  GLUCAP 333* 228*    Recent Results (from the past 240 hour(s))  Blood Culture (routine x 2)     Status: None (Preliminary result)   Collection Time: 03/20/17  6:20 PM  Result Value Ref Range Status   Specimen Description BLOOD LEFT FOREARM  Final   Special Requests   Final    BOTTLES DRAWN AEROBIC AND ANAEROBIC Blood Culture adequate volume   Culture  Setup Time   Final    GRAM NEGATIVE RODS AEROBIC BOTTLE ONLY Organism ID to follow    Culture GRAM NEGATIVE RODS  Final   Report Status PENDING  Incomplete  MRSA PCR Screening     Status: Abnormal   Collection Time: 03/20/17 11:10 PM  Result Value Ref Range Status   MRSA by PCR POSITIVE (A) NEGATIVE Final    Comment:        The GeneXpert MRSA Assay (FDA approved for NASAL specimens only), is one component of a comprehensive MRSA colonization surveillance program. It is not intended to diagnose MRSA infection nor to guide or monitor treatment for MRSA infections. RESULT CALLED TO, READ BACK BY AND VERIFIED WITH: Lily Peer [id number] @0400  THANEY      Studies: Dg Chest 2 View  Result Date: 03/20/2017 CLINICAL  DATA:  Initial evaluation for acute fever, altered mental status. EXAM: CHEST  2 VIEW COMPARISON:  Prior radiograph from 07/22/2016. FINDINGS: Mild cardiomegaly, stable.  Mediastinal silhouette normal. Lungs hypoinflated. Mild patchy left basilar opacity, favored to reflect atelectasis, although infiltrate could be considered in the correct clinical setting. No other focal airspace disease. No pulmonary edema or pleural effusion. No pneumothorax. Osseous structures within normal limits. IMPRESSION: Shallow lung inflation with mild patchy left left lower lobe opacity. Atelectasis is favored. Infectious infiltrate could be considered in the correct clinical setting. Electronically Signed   By: Jeannine Boga M.D.   On: 03/20/2017 19:29    Scheduled Meds: . atorvastatin  20 mg Oral q1800  . Chlorhexidine Gluconate Cloth  6 each Topical Q0600  . docusate  sodium  100 mg Oral BID  . escitalopram  10 mg Oral Daily  . famotidine  20 mg Oral BID  . heparin  5,000 Units Subcutaneous Q8H  . insulin aspart  0-15 Units Subcutaneous TID WC  . insulin aspart  0-5 Units Subcutaneous QHS  . insulin detemir  30 Units Subcutaneous BID  . Melatonin  9 mg Oral QHS  . mupirocin ointment  1 application Nasal BID  . omega-3 acid ethyl esters  1 g Oral BID  . potassium chloride SA  20 mEq Oral Daily  . vitamin B-12  1,000 mcg Oral Daily   Continuous Infusions: . ceFEPime (MAXIPIME) IV    . sodium chloride    . vancomycin      Principal Problem:   Sepsis due to undetermined organism [facility]) Active Problems:   Mental retardation   Hypertension   AKI (acute kidney injury) ([place])   Pernicious anemia   Diabetes mellitus without complication ([place])   Sepsis, unspecified organism ([place])    Time spent: >35 minutes     Kinnie Feil  [facility] Pager [phone]. If 7PM-7AM, please contact night-coverage at [email or web address], password [facility] 03/21/2017, 8:48 AM  LOS: 1 day

## 2017-03-21 NOTE — Progress Notes (Signed)
PHARMACY - PHYSICIAN COMMUNICATION CRITICAL VALUE ALERT - BLOOD CULTURE IDENTIFICATION (BCID)  Mark Chang is an 71 y.o. male who presented to [facility] on 03/20/2017 with a chief complaint of lethargy, fever.   Name of physician (or Provider) ContactedDaleen Bo  Current antibiotics: Vancomycin + cefepime  Changes to prescribed antibiotics recommended:  Response not received from provider;  current antibiotics are likely to cover the isolated organism.  Consider de-escalation soon. Consider ceftriaxone 2gm IV Q24H  Results for orders placed or performed during the hospital encounter of 03/20/17  Blood Culture ID Panel (Reflexed) (Collected: 03/20/2017  6:20 PM)  Result Value Ref Range   Enterococcus species NOT DETECTED NOT DETECTED   Listeria monocytogenes NOT DETECTED NOT DETECTED   Staphylococcus species NOT DETECTED NOT DETECTED   Staphylococcus aureus NOT DETECTED NOT DETECTED   Streptococcus species NOT DETECTED NOT DETECTED   Streptococcus agalactiae NOT DETECTED NOT DETECTED   Streptococcus pneumoniae NOT DETECTED NOT DETECTED   Streptococcus pyogenes NOT DETECTED NOT DETECTED   Acinetobacter baumannii NOT DETECTED NOT DETECTED   Enterobacteriaceae species DETECTED (A) NOT DETECTED   Enterobacter cloacae complex NOT DETECTED NOT DETECTED   Escherichia coli DETECTED (A) NOT DETECTED   Klebsiella oxytoca NOT DETECTED NOT DETECTED   Klebsiella pneumoniae NOT DETECTED NOT DETECTED   Proteus species NOT DETECTED NOT DETECTED   Serratia marcescens NOT DETECTED NOT DETECTED   Carbapenem resistance NOT DETECTED NOT DETECTED   Haemophilus influenzae NOT DETECTED NOT DETECTED   Neisseria meningitidis NOT DETECTED NOT DETECTED   Pseudomonas aeruginosa NOT DETECTED NOT DETECTED   Candida albicans NOT DETECTED NOT DETECTED   Candida glabrata NOT DETECTED NOT DETECTED   Candida krusei NOT DETECTED NOT DETECTED   Candida parapsilosis NOT DETECTED NOT DETECTED   Candida tropicalis  NOT DETECTED NOT DETECTED    Kohlton Gilpatrick, Rande Lawman 03/21/2017  10:06 AM

## 2017-03-22 DIAGNOSIS — R7881 Bacteremia: Secondary | ICD-10-CM

## 2017-03-22 DIAGNOSIS — N390 Urinary tract infection, site not specified: Secondary | ICD-10-CM

## 2017-03-22 DIAGNOSIS — B9620 Unspecified Escherichia coli [E. coli] as the cause of diseases classified elsewhere: Secondary | ICD-10-CM

## 2017-03-22 LAB — CBC
HEMATOCRIT: 31.7 % — AB (ref 39.0–52.0)
Hemoglobin: 10.1 g/dL — ABNORMAL LOW (ref 13.0–17.0)
MCH: 28.5 pg (ref 26.0–34.0)
MCHC: 31.9 g/dL (ref 30.0–36.0)
MCV: 89.5 fL (ref 78.0–100.0)
PLATELETS: 141 10*3/uL — AB (ref 150–400)
RBC: 3.54 MIL/uL — ABNORMAL LOW (ref 4.22–5.81)
RDW: 14 % (ref 11.5–15.5)
WBC: 11.4 10*3/uL — AB (ref 4.0–10.5)

## 2017-03-22 LAB — BASIC METABOLIC PANEL
Anion gap: 8 (ref 5–15)
BUN: 30 mg/dL — ABNORMAL HIGH (ref 6–20)
CALCIUM: 7.6 mg/dL — AB (ref 8.9–10.3)
CO2: 20 mmol/L — AB (ref 22–32)
CREATININE: 1.61 mg/dL — AB (ref 0.61–1.24)
Chloride: 107 mmol/L (ref 101–111)
GFR, EST AFRICAN AMERICAN: 48 mL/min — AB (ref >60)
GFR, EST NON AFRICAN AMERICAN: 41 mL/min — AB (ref >60)
GLUCOSE: 208 mg/dL — AB (ref 65–99)
Potassium: 3.8 mmol/L (ref 3.5–5.1)
Sodium: 135 mmol/L (ref 135–145)

## 2017-03-22 LAB — GLUCOSE, CAPILLARY
GLUCOSE-CAPILLARY: 189 mg/dL — AB (ref 65–99)
Glucose-Capillary: 137 mg/dL — ABNORMAL HIGH (ref 65–99)
Glucose-Capillary: 213 mg/dL — ABNORMAL HIGH (ref 65–99)
Glucose-Capillary: 229 mg/dL — ABNORMAL HIGH (ref 65–99)

## 2017-03-22 LAB — LACTIC ACID, PLASMA: LACTIC ACID, VENOUS: 1.8 mmol/L (ref 0.5–1.9)

## 2017-03-22 NOTE — Evaluation (Signed)
Physical Therapy Evaluation Patient Details Name: Mark Chang MRN: [id number] DOB: 14-Jul-1945 Today's Date: 03/22/2017   History of Present Illness  71 y.o. male with medical history significant for insulin-dependent diabetes mellitus, hypertension, and developmental disabilities, presenting from his nursing facility for evaluation lethargy and fever. Found to have UTI.   Clinical Impression  Patient evaluated by Physical Therapy with no further acute PT needs identified. All education has been completed and the patient has no further questions. Pt is currently mod I for transfers and ambulation within the room pushing his IV pole. Pt reviewed exercises he does in his room at ALF. See below for any follow-up Physical Therapy or equipment needs. PT is signing off. Thank you for this referral.     Follow Up Recommendations No PT follow up    Equipment Recommendations  None recommended by PT    Recommendations for Other Services       Precautions / Restrictions Precautions Precautions: Fall Restrictions Weight Bearing Restrictions: No      Mobility  Bed Mobility               General bed mobility comments: OOB in restroom at entry  Transfers Overall transfer level: Modified independent Equipment used: None             General transfer comment: good steady power up into standing and safe descent to chair  Ambulation/Gait Ambulation/Gait assistance: Modified independent (Device/Increase time) Ambulation Distance (Feet): 18 Feet Assistive device: (pushed IV pole) Gait Pattern/deviations: Shuffle;Step-through pattern Gait velocity: slowed Gait velocity interpretation: Below normal speed for age/gender General Gait Details: slow, steady shuffling steps, with wide BoS, no instability     Balance Overall balance assessment: No apparent balance deficits (not formally assessed)                                           Pertinent Vitals/Pain  Pain Assessment: No/denies pain    Home Living Family/patient expects to be discharged to:: Assisted living(on [address])               Home Equipment: None      Prior Function Level of Independence: Needs assistance   Gait / Transfers Assistance Needed: ambulates within facility without AD  ADL's / Homemaking Assistance Needed: able to bathe and dress himself, eats in dining room            Extremity/Trunk Assessment   Upper Extremity Assessment Upper Extremity Assessment: Overall WFL for tasks assessed    Lower Extremity Assessment Lower Extremity Assessment: Generalized weakness       Communication   Communication: No difficulties  Cognition Arousal/Alertness: Awake/alert Behavior During Therapy: WFL for tasks assessed/performed Overall Cognitive Status: No family/caregiver present to determine baseline cognitive functioning                                        General Comments General comments (skin integrity, edema, etc.): Pt states he feels a little weaker than normal but that he is feeling much better    Exercises General Exercises - Lower Extremity Ankle Circles/Pumps: AROM;Both;10 reps;Seated Long Arc Quad: AROM;Both;10 reps;Seated Hip Flexion/Marching: AROM;Both;10 reps;Seated   Assessment/Plan    PT Assessment Patent does not need any further PT services  PT Problem List  PT Treatment Interventions      PT Goals (Current goals can be found in the Care Plan section)  Acute Rehab PT Goals Patient Stated Goal: go back home PT Goal Formulation: With patient     AM-PAC PT "6 Clicks" Daily Activity  Outcome Measure Difficulty turning over in bed (including adjusting bedclothes, sheets and blankets)?: A Little Difficulty moving from lying on back to sitting on the side of the bed? : A Lot Difficulty sitting down on and standing up from a chair with arms (e.g., wheelchair, bedside commode, etc,.)?: A Little Help  needed moving to and from a bed to chair (including a wheelchair)?: None Help needed walking in hospital room?: None Help needed climbing 3-5 steps with a railing? : A Little 6 Click Score: 19    End of Session   Activity Tolerance: Patient tolerated treatment well Patient left: in chair;with call bell/phone within reach;with chair alarm set Nurse Communication: Mobility status      Time: 4782-9562 PT Time Calculation (min) (ACUTE ONLY): 15 min   Charges:   PT Evaluation $PT Eval Low Complexity: 1 Low     PT G Codes:        Mark Chang PT, DPT Acute Rehabilitation  [phone] Pager [phone]    [place] 03/22/2017, 10:37 AM

## 2017-03-22 NOTE — Progress Notes (Addendum)
Pt have had 3 technicians attempt blood draw for lactic acid 6 times. They have been unsuccessful. NP on call notified.  All routine blood draws were unsuccessful. Technicians says they would re attempt in the morning. Will continue to monitor.

## 2017-03-22 NOTE — Progress Notes (Signed)
PROGRESS NOTE    Mark CECI  MWU:[id number] DOB: 1945/07/12 DOA: 03/20/2017 PCP: Terrill Mohr, NP     Brief Narrative:  71 y.o. male PMHx Diabetes type 2 uncontrolled with complication, HTN, HLD, COPD, pernicious anemia, mental Retardation,   presenting from his nursing facility for evaluation lethargy and fever.  Patient was noted to be less active and less engaged at his nursing facility and seemed to be warm to the touch.  Temperature of 102 F was documented and EMS was called out for evaluation.  Patient reported some mild dyspnea and cough, denies abdominal pain, vomiting, or diarrhea.  Denies chest pain or headache.  He was treated with 1 g of Tylenol by EMS and brought into the ED for evaluation.   ED Course: Upon arrival to the ED, patient is found to be afebrile, saturating well on room air, tachycardic in the 110s, and with stable blood pressure.  EKG features a sinus tachycardia with rate 115 and chest x-ray is notable for shallow inflation and patchy left lower lobe opacity suggestive of atelectasis, or possibly infection.  Chemistry panel is notable for sodium of 133, glucose 372, and creatinine of 1.61, up from 1.20 earlier this year.  CBC is notable for leukocytosis to 16,200 and a stable normocytic anemia with hemoglobin of 12.3.  Lactic acid is elevated to 3.32.  Blood cultures were collected, 1 L of normal saline was administered, and the patient was treated with vancomycin and Zosyn in the ED.      Subjective: 12/24/ AO x4 negative CP, negative S OB, negative abdominal pain.  Negative dysuria, negative CVA tenderness.     Assessment & Plan:   Principal Problem:   Sepsis due to undetermined organism [facility]) Active Problems:   Mental retardation   Hypertension   AKI (acute kidney injury) ([place])   Pernicious anemia   Diabetes mellitus without complication ([facility])   Sepsis, unspecified organism ([place])   Sepsis/Pneumonia/ positive E. coli bacteremia/ positive E.  coli UTI.  -Continue current antibiotics patient will require at least 2 weeks of IV antibiotics. -Awaiting susceptibility studies. -Obtain repeat blood cultures on 12/25 and if negative place PICC line for home antibiotics -Normal saline 125 ml/hr -Echocardiogram pending  Acute kidney injury.  (Baseline Cr 1.1-1.2) Recent Labs  Lab 03/20/17 1820 03/21/17 0008 03/22/17 0240  CREATININE 1.61* 1.55* 1.61*  -Prerenal azotemia? -Strict in and out  Diabetes type 2  -Hemoglobin A1c pending -Lipid panel pending  -Levemir 30 units BID -Moderate SSI -Would not discharge on glipizide as patient is uncontrolled   Essential hypertension. -Improved    DVT prophylaxis: Subcu heparin Code Status: Full Family Communication: None Disposition Plan: TBD   Consultants:  None  Procedures/Significant Events:  Echocardiogram pending   I have personally reviewed and interpreted all radiology studies and my findings are as above.  VENTILATOR SETTINGS:    Cultures 12/22 blood positive E. coli bacteremia 12/22 urine positive E. coli 12/22 positive MRSA by PCR     Antimicrobials: Anti-infectives (From admission, onward)   Start     Stop   03/21/17 1000  vancomycin (VANCOCIN) IVPB 750 mg/150 ml premix         03/21/17 0100  piperacillin-tazobactam (ZOSYN) IVPB 3.375 g  Status:  Discontinued     03/20/17 2118   03/20/17 2130  ceFEPIme (MAXIPIME) 2 g in dextrose 5 % 50 mL IVPB  Status:  Discontinued     03/20/17 2129   03/20/17 2130  ceFEPIme (MAXIPIME) 2 g  in dextrose 5 % 50 mL IVPB         03/20/17 1845  vancomycin (VANCOCIN) 2,500 mg in sodium chloride 0.9 % 500 mL IVPB     03/21/17 0050   03/20/17 1845  piperacillin-tazobactam (ZOSYN) IVPB 3.375 g     03/20/17 1946       Devices    LINES / TUBES:      Continuous Infusions: . sodium chloride 125 mL/hr at 03/21/17 2334  . ceFEPime (MAXIPIME) IV Stopped (03/21/17 2320)  . sodium chloride    . vancomycin  Stopped (03/22/17 1030)     Objective: Vitals:   03/21/17 2245 03/21/17 2248 03/22/17 0120 03/22/17 0521  BP: (!) 125/54 (!) 125/54  (!) 144/72  Pulse: 90 90  84  Resp: (!) 23 (!) 23  18  Temp: 98 F (36.7 C) 98 F (36.7 C) 98.2 F (36.8 C) 98.7 F (37.1 C)  TempSrc: Oral Oral Axillary Oral  SpO2: 100% 100%  100%  Weight:    269 lb 10 oz (122.3 kg)  Height:        Intake/Output Summary (Last 24 hours) at 03/22/2017 1446 Last data filed at 03/22/2017 1000 Gross per 24 hour  Intake 3635 ml  Output 800 ml  Net 2835 ml   Filed Weights   03/21/17 0032 03/21/17 0514 03/22/17 0521  Weight: 261 lb 0.4 oz (118.4 kg) 262 lb 2 oz (118.9 kg) 269 lb 10 oz (122.3 kg)    Examination:  General: A/O x4 No acute respiratory distress Neck:  Negative scars, masses, torticollis, lymphadenopathy, JVD Lungs: Clear to auscultation bilaterally without wheezes or crackles Cardiovascular: Regular rate and rhythm without murmur gallop or rub normal S1 and S2 Abdomen: Morbidly obese, negative abdominal pain, nondistended, positive soft, bowel sounds, no rebound, no ascites, no appreciable mass, positive right CVA tenderness mild Extremities: No significant cyanosis, clubbing, or edema bilateral lower extremities Skin: Negative rashes, lesions, ulcers Psychiatric:  Negative depression, negative anxiety, negative fatigue, negative mania, mild mental retardation apparent. Central nervous system:  Cranial nerves II through XII intact, tongue/uvula midline, all extremities muscle strength 5/5, sensation intact throughout,  negative dysarthria, negative expressive aphasia, negative receptive aphasia.  .     Data Reviewed: Care during the described time interval was provided by me .  I have reviewed this patient's available data, including medical history, events of note, physical examination, and all test results as part of my evaluation.  CBC: Recent Labs  Lab 03/20/17 1820 03/21/17 0008  03/22/17 0240  WBC 16.2* 15.8* 11.4*  NEUTROABS 14.4* 14.2*  --   HGB 12.3* 12.0* 10.1*  HCT 37.1* 37.2* 31.7*  MCV 87.7 88.6 89.5  PLT 168 177 268*   Basic Metabolic Panel: Recent Labs  Lab 03/20/17 1820 03/21/17 0008 03/22/17 0240  NA 133* 137 135  K 3.7 3.7 3.8  CL 100* 104 107  CO2 21* 23 20*  GLUCOSE 372* 322* 208*  BUN 28* 26* 30*  CREATININE 1.61* 1.55* 1.61*  CALCIUM 8.5* 8.0* 7.6*   GFR: Estimated Creatinine Clearance: 52.8 mL/min (A) (by C-G formula based on SCr of 1.61 mg/dL (H)). Liver Function Tests: Recent Labs  Lab 03/20/17 1820  AST 29  ALT 22  ALKPHOS 52  BILITOT 1.0  PROT 7.2  ALBUMIN 3.1*   No results for input(s): LIPASE, AMYLASE in the last 168 hours. No results for input(s): AMMONIA in the last 168 hours. Coagulation Profile: Recent Labs  Lab 03/20/17 2139  INR 1.28  Cardiac Enzymes: No results for input(s): CKTOTAL, CKMB, CKMBINDEX, TROPONINI in the last 168 hours. BNP (last 3 results) No results for input(s): PROBNP in the last 8760 hours. HbA1C: No results for input(s): HGBA1C in the last 72 hours. CBG: Recent Labs  Lab 03/21/17 1209 03/21/17 1717 03/21/17 2146 03/22/17 0802 03/22/17 1148  GLUCAP 200* 208* 171* 137* 213*   Lipid Profile: No results for input(s): CHOL, HDL, LDLCALC, TRIG, CHOLHDL, LDLDIRECT in the last 72 hours. Thyroid Function Tests: No results for input(s): TSH, T4TOTAL, FREET4, T3FREE, THYROIDAB in the last 72 hours. Anemia Panel: No results for input(s): VITAMINB12, FOLATE, FERRITIN, TIBC, IRON, RETICCTPCT in the last 72 hours. Sepsis Labs: Recent Labs  Lab 03/20/17 1828 03/20/17 2110 03/20/17 2139 03/21/17 0001  PROCALCITON  --   --  3.81  --   LATICACIDVEN 3.32* 4.01* 2.8* 2.4*    Recent Results (from the past 240 hour(s))  Blood Culture (routine x 2)     Status: Abnormal (Preliminary result)   Collection Time: 03/20/17  6:20 PM  Result Value Ref Range Status   Specimen Description BLOOD  LEFT FOREARM  Final   Special Requests   Final    BOTTLES DRAWN AEROBIC AND ANAEROBIC Blood Culture adequate volume   Culture  Setup Time   Final    GRAM NEGATIVE RODS IN BOTH AEROBIC AND ANAEROBIC BOTTLES CRITICAL RESULT CALLED TO, READ BACK BY AND VERIFIED WITH: R RUMBARGER,PHARMD AT 1004 03/21/17 BY L BENFIELD    Culture ESCHERICHIA COLI SUSCEPTIBILITIES TO FOLLOW  (A)  Final   Report Status PENDING  Incomplete  Blood Culture ID Panel (Reflexed)     Status: Abnormal   Collection Time: 03/20/17  6:20 PM  Result Value Ref Range Status   Enterococcus species NOT DETECTED NOT DETECTED Final   Listeria monocytogenes NOT DETECTED NOT DETECTED Final   Staphylococcus species NOT DETECTED NOT DETECTED Final   Staphylococcus aureus NOT DETECTED NOT DETECTED Final   Streptococcus species NOT DETECTED NOT DETECTED Final   Streptococcus agalactiae NOT DETECTED NOT DETECTED Final   Streptococcus pneumoniae NOT DETECTED NOT DETECTED Final   Streptococcus pyogenes NOT DETECTED NOT DETECTED Final   Acinetobacter baumannii NOT DETECTED NOT DETECTED Final   Enterobacteriaceae species DETECTED (A) NOT DETECTED Final    Comment: Enterobacteriaceae represent a large family of gram-negative bacteria, not a single organism. CRITICAL RESULT CALLED TO, READ BACK BY AND VERIFIED WITH: R RUMBARGER,PHARMD AT 1004 03/21/17 BY L BENFIELD    Enterobacter cloacae complex NOT DETECTED NOT DETECTED Final   Escherichia coli DETECTED (A) NOT DETECTED Final    Comment: CRITICAL RESULT CALLED TO, READ BACK BY AND VERIFIED WITH: R RUMBARGER,PHARMD AT 1004 03/21/17 BY L BENFIELD    Klebsiella oxytoca NOT DETECTED NOT DETECTED Final   Klebsiella pneumoniae NOT DETECTED NOT DETECTED Final   Proteus species NOT DETECTED NOT DETECTED Final   Serratia marcescens NOT DETECTED NOT DETECTED Final   Carbapenem resistance NOT DETECTED NOT DETECTED Final   Haemophilus influenzae NOT DETECTED NOT DETECTED Final   Neisseria  meningitidis NOT DETECTED NOT DETECTED Final   Pseudomonas aeruginosa NOT DETECTED NOT DETECTED Final   Candida albicans NOT DETECTED NOT DETECTED Final   Candida glabrata NOT DETECTED NOT DETECTED Final   Candida krusei NOT DETECTED NOT DETECTED Final   Candida parapsilosis NOT DETECTED NOT DETECTED Final   Candida tropicalis NOT DETECTED NOT DETECTED Final  Blood Culture (routine x 2)     Status: None (Preliminary result)  Collection Time: 03/20/17  6:41 PM  Result Value Ref Range Status   Specimen Description BLOOD BLOOD RIGHT HAND  Final   Special Requests   Final    BOTTLES DRAWN AEROBIC AND ANAEROBIC Blood Culture adequate volume   Culture  Setup Time   Final    GRAM NEGATIVE RODS AEROBIC BOTTLE ONLY CRITICAL RESULT CALLED TO, READ BACK BY AND VERIFIED WITH: H BAIRD,PHARMD AT 1914 03/21/17 BY L BENFIELD    Culture GRAM NEGATIVE RODS  Final   Report Status PENDING  Incomplete  Urine Culture     Status: Abnormal (Preliminary result)   Collection Time: 03/20/17  9:11 PM  Result Value Ref Range Status   Specimen Description URINE, RANDOM  Final   Special Requests NONE  Final   Culture >=100,000 COLONIES/mL ESCHERICHIA COLI (A)  Final   Report Status PENDING  Incomplete  MRSA PCR Screening     Status: Abnormal   Collection Time: 03/20/17 11:10 PM  Result Value Ref Range Status   MRSA by PCR POSITIVE (A) NEGATIVE Final    Comment:        The GeneXpert MRSA Assay (FDA approved for NASAL specimens only), is one component of a comprehensive MRSA colonization surveillance program. It is not intended to diagnose MRSA infection nor to guide or monitor treatment for MRSA infections. RESULT CALLED TO, READ BACK BY AND VERIFIED WITH: Lily Peer [id number] @0400  [facility]          Radiology Studies: Dg Chest 2 View  Result Date: 03/20/2017 CLINICAL DATA:  Initial evaluation for acute fever, altered mental status. EXAM: CHEST  2 VIEW COMPARISON:  Prior radiograph from  07/22/2016. FINDINGS: Mild cardiomegaly, stable.  Mediastinal silhouette normal. Lungs hypoinflated. Mild patchy left basilar opacity, favored to reflect atelectasis, although infiltrate could be considered in the correct clinical setting. No other focal airspace disease. No pulmonary edema or pleural effusion. No pneumothorax. Osseous structures within normal limits. IMPRESSION: Shallow lung inflation with mild patchy left left lower lobe opacity. Atelectasis is favored. Infectious infiltrate could be considered in the correct clinical setting. Electronically Signed   By: Jeannine Boga M.D.   On: 03/20/2017 19:29        Scheduled Meds: . atorvastatin  20 mg Oral q1800  . Chlorhexidine Gluconate Cloth  6 each Topical Q0600  . docusate sodium  100 mg Oral BID  . escitalopram  10 mg Oral Daily  . famotidine  20 mg Oral BID  . heparin  5,000 Units Subcutaneous Q8H  . insulin aspart  0-15 Units Subcutaneous TID WC  . insulin aspart  0-5 Units Subcutaneous QHS  . insulin detemir  30 Units Subcutaneous BID  . Melatonin  9 mg Oral QHS  . mupirocin ointment  1 application Nasal BID  . omega-3 acid ethyl esters  1 g Oral BID  . potassium chloride SA  20 mEq Oral Daily  . vitamin B-12  1,000 mcg Oral Daily   Continuous Infusions: . sodium chloride 125 mL/hr at 03/21/17 2334  . ceFEPime (MAXIPIME) IV Stopped (03/21/17 2320)  . sodium chloride    . vancomycin Stopped (03/22/17 1030)     LOS: 2 days    Time spent:40 min    Diamond Martucci, Geraldo Docker, MD [facility] Pager [phone]  If 7PM-7AM, please contact night-coverage [email or web address] Password [facility] 03/22/2017, 2:46 PM

## 2017-03-23 ENCOUNTER — Inpatient Hospital Stay (HOSPITAL_COMMUNITY): Payer: Medicare Other

## 2017-03-23 DIAGNOSIS — E7849 Other hyperlipidemia: Secondary | ICD-10-CM

## 2017-03-23 DIAGNOSIS — R7881 Bacteremia: Secondary | ICD-10-CM

## 2017-03-23 DIAGNOSIS — E1165 Type 2 diabetes mellitus with hyperglycemia: Secondary | ICD-10-CM

## 2017-03-23 DIAGNOSIS — I5041 Acute combined systolic (congestive) and diastolic (congestive) heart failure: Secondary | ICD-10-CM

## 2017-03-23 DIAGNOSIS — E118 Type 2 diabetes mellitus with unspecified complications: Secondary | ICD-10-CM

## 2017-03-23 LAB — HEMOGLOBIN A1C
Hgb A1c MFr Bld: 9.9 % — ABNORMAL HIGH (ref 4.8–5.6)
Mean Plasma Glucose: 237.43 mg/dL

## 2017-03-23 LAB — ECHOCARDIOGRAM COMPLETE
AVLVOTPG: 6 mmHg
CHL CUP DOP CALC LVOT VTI: 25 cm
CHL CUP MV DEC (S): 208
EERAT: 12.94
EWDT: 208 ms
FS: 20 % — AB (ref 28–44)
Height: 68 in
IV/PV OW: 0.85
LA vol A4C: 57.5 ml
LA vol: 61.6 mL
LADIAMINDEX: 2.03 cm/m2
LASIZE: 47 mm
LAVOLIN: 26.6 mL/m2
LEFT ATRIUM END SYS DIAM: 47 mm
LV E/e' medial: 12.94
LV e' LATERAL: 8.27 cm/s
LVEEAVG: 12.94
LVOT SV: 133 mL
LVOT area: 5.31 cm2
LVOTD: 26 mm
LVOTPV: 122 cm/s
Lateral S' vel: 14 cm/s
MV Peak grad: 5 mmHg
MV pk A vel: 129 m/s
MVPKEVEL: 107 m/s
PW: 13 mm — AB (ref 0.6–1.1)
TAPSE: 16.5 mm
TDI e' lateral: 8.27
TDI e' medial: 6.31
WEIGHTICAEL: 4313.96 [oz_av]

## 2017-03-23 LAB — CBC
HEMATOCRIT: 34.4 % — AB (ref 39.0–52.0)
Hemoglobin: 11.5 g/dL — ABNORMAL LOW (ref 13.0–17.0)
MCH: 29 pg (ref 26.0–34.0)
MCHC: 33.4 g/dL (ref 30.0–36.0)
MCV: 86.9 fL (ref 78.0–100.0)
Platelets: 133 10*3/uL — ABNORMAL LOW (ref 150–400)
RBC: 3.96 MIL/uL — ABNORMAL LOW (ref 4.22–5.81)
RDW: 13.9 % (ref 11.5–15.5)
WBC: 9.1 10*3/uL (ref 4.0–10.5)

## 2017-03-23 LAB — URINE CULTURE

## 2017-03-23 LAB — BASIC METABOLIC PANEL
Anion gap: 8 (ref 5–15)
BUN: 24 mg/dL — AB (ref 6–20)
CHLORIDE: 109 mmol/L (ref 101–111)
CO2: 22 mmol/L (ref 22–32)
Calcium: 8 mg/dL — ABNORMAL LOW (ref 8.9–10.3)
Creatinine, Ser: 1.41 mg/dL — ABNORMAL HIGH (ref 0.61–1.24)
GFR calc Af Amer: 56 mL/min — ABNORMAL LOW (ref >60)
GFR calc non Af Amer: 48 mL/min — ABNORMAL LOW (ref >60)
GLUCOSE: 125 mg/dL — AB (ref 65–99)
POTASSIUM: 3.7 mmol/L (ref 3.5–5.1)
Sodium: 139 mmol/L (ref 135–145)

## 2017-03-23 LAB — LIPID PANEL
CHOL/HDL RATIO: 8 ratio
CHOLESTEROL: 144 mg/dL (ref 0–200)
HDL: 18 mg/dL — ABNORMAL LOW (ref >40)
LDL Cholesterol: 60 mg/dL (ref 0–99)
Triglycerides: 330 mg/dL — ABNORMAL HIGH (ref <150)
VLDL: 66 mg/dL — AB (ref 0–40)

## 2017-03-23 LAB — CULTURE, BLOOD (ROUTINE X 2)
SPECIAL REQUESTS: ADEQUATE
Special Requests: ADEQUATE

## 2017-03-23 LAB — GLUCOSE, CAPILLARY
Glucose-Capillary: 120 mg/dL — ABNORMAL HIGH (ref 65–99)
Glucose-Capillary: 178 mg/dL — ABNORMAL HIGH (ref 65–99)
Glucose-Capillary: 208 mg/dL — ABNORMAL HIGH (ref 65–99)
Glucose-Capillary: 268 mg/dL — ABNORMAL HIGH (ref 65–99)
Glucose-Capillary: 269 mg/dL — ABNORMAL HIGH (ref 65–99)

## 2017-03-23 LAB — MAGNESIUM: Magnesium: 1.9 mg/dL (ref 1.7–2.4)

## 2017-03-23 MED ORDER — ATORVASTATIN CALCIUM 40 MG PO TABS
40.0000 mg | ORAL_TABLET | Freq: Every day | ORAL | Status: DC
  Administered 2017-03-23 – 2017-03-24 (×2): 40 mg via ORAL
  Filled 2017-03-23 (×2): qty 1

## 2017-03-23 MED ORDER — INSULIN DETEMIR 100 UNIT/ML ~~LOC~~ SOLN
32.0000 [IU] | Freq: Two times a day (BID) | SUBCUTANEOUS | Status: DC
  Administered 2017-03-23 – 2017-03-24 (×2): 32 [IU] via SUBCUTANEOUS
  Filled 2017-03-23 (×3): qty 0.32

## 2017-03-23 NOTE — Progress Notes (Signed)
Pharmacy Antibiotic Note  Mark Chang is a 71 y.o. male admitted on 03/20/2017 with sepsis secondary to an e-coli bacteremia/UTI.  Pharmacy has been consulted for vancomycin and cefepime dosing. WBC/lactic acid trending down, afebrile, PCT elevated on admission. Blood cultures and urine culture grew e-coli that is only resistant to ciprofloxacin, but sensitive to cefazolin, ceftriaxone, zosyn, unasyn, etc.  Repeat blood cultures to be obtained 12/25 and a PICC will be placed for home therapy if the cultures are negative. Will require at least two weeks of IV antibiotics. With current susceptibilities, can consider narrowing therapy.  Plan: Vancomycin 750mg  q12hrs Cefepime 2g IV q24hrs Follow cultures, renal function, ABX plan going forward Vancomycin trough as clinically indicated  Height: 5\' 8"  (172.7 cm) Weight: 269 lb 10 oz (122.3 kg) IBW/kg (Calculated) : 68.4  Temp (24hrs), Avg:98.8 F (37.1 C), Min:98.2 F (36.8 C), Max:99.3 F (37.4 C)  Recent Labs  Lab 03/20/17 1820 03/20/17 1828 03/20/17 2110 03/20/17 2139 03/21/17 0001 03/21/17 0008 03/22/17 0240 03/22/17 1524  WBC 16.2*  --   --   --   --  15.8* 11.4*  --   CREATININE 1.61*  --   --   --   --  1.55* 1.61*  --   LATICACIDVEN  --  3.32* 4.01* 2.8* 2.4*  --   --  1.8    Estimated Creatinine Clearance: 52.8 mL/min (A) (by C-G formula based on SCr of 1.61 mg/dL (H)).    No Known Allergies  Antimicrobials this admission: Vanc 12/22>> Cefepime 12/22>> Zosyn 12/22>>12/22  Microbiology results: Blood Cx 12/22>> E-coli (R: Cipro, S: all others>Cefazolin, CTX, Unasyn, Bactrim) Urine Cx 12/22>> E-coli w/ same susceptibilities as blood above MRSA PCR 12/22>> Positive Strep pneumo 12/23 >>neg  Thank you for allowing pharmacy to be a part of this patient's care.   Patterson Hammersmith PharmD PGY1 Pharmacy Practice Resident 03/23/2017 10:12 AM Pager: [phone] Phone: [phone]

## 2017-03-23 NOTE — Progress Notes (Signed)
PROGRESS NOTE    Mark Chang  NIO:[id number] DOB: January 11, 1946 DOA: 03/20/2017 PCP: Terrill Mohr, NP     Brief Narrative:  71 y.o. male PMHx Diabetes type 2 uncontrolled with complication, HTN, HLD, COPD, pernicious anemia, mental Retardation,   presenting from his nursing facility for evaluation lethargy and fever.  Patient was noted to be less active and less engaged at his nursing facility and seemed to be warm to the touch.  Temperature of 102 F was documented and EMS was called out for evaluation.  Patient reported some mild dyspnea and cough, denies abdominal pain, vomiting, or diarrhea.  Denies chest pain or headache.  He was treated with 1 g of Tylenol by EMS and brought into the ED for evaluation.   ED Course: Upon arrival to the ED, patient is found to be afebrile, saturating well on room air, tachycardic in the 110s, and with stable blood pressure.  EKG features a sinus tachycardia with rate 115 and chest x-ray is notable for shallow inflation and patchy left lower lobe opacity suggestive of atelectasis, or possibly infection.  Chemistry panel is notable for sodium of 133, glucose 372, and creatinine of 1.61, up from 1.20 earlier this year.  CBC is notable for leukocytosis to 16,200 and a stable normocytic anemia with hemoglobin of 12.3.  Lactic acid is elevated to 3.32.  Blood cultures were collected, 1 L of normal saline was administered, and the patient was treated with vancomycin and Zosyn in the ED.      Subjective: 12/25 A/O 4, negative CP, negative SOB, negative abdominal pain.    Assessment & Plan:   Principal Problem:   Sepsis due to undetermined organism [facility]) Active Problems:   Mental retardation   Hypertension   AKI (acute kidney injury) ([place])   Pernicious anemia   Diabetes mellitus without complication (HCC)   Sepsis, unspecified organism ([place])   Sepsis/Pneumonia/ positive E. coli bacteremia/ positive E. coli UTI.  -Continue current antibiotics patient  will require at least 2 weeks of IV antibiotics. -Awaiting susceptibility studies. -Obtain repeat blood cultures on 12/25 and if negative place PICC line for home antibiotics -12/25blood culture pending. If clear will be to place PICC line  Acute kidney injury.  (Baseline Cr 1.1-1.2) Recent Labs  Lab 03/20/17 1820 03/21/17 0008 03/22/17 0240 03/23/17 0816  CREATININE 1.61* 1.55* 1.61* 1.41*  -continues to improve with hydration -Prerenal azotemia?  Diabetes type 2 uncontrolled with complication -81/82 Hemoglobin A1c=9.9  -Lipid panel pending  -12/25 increase Levemir 32 units BID -moderate SSI -Would not discharge on glipizide as patient is uncontrolled  HLD -Lipid panel not within ADA guidelines (hypertriglyceridemia) -12/25increase Lipitor 40 mg daily: May have to add niacin   Acute systolic and diastolic CHF -Most likely chronic however no previous echocardiogram for comparison -Strict in and out since admission +4.0 L -Daily weight Filed Weights   03/21/17 0032 03/21/17 0514 03/22/17 0521  Weight: 261 lb 0.4 oz (118.4 kg) 262 lb 2 oz (118.9 kg) 269 lb 10 oz (122.3 kg)  -will have to monitor closely for fluid overload: Decrease normal saline 75ml/hr  Essential hypertension. -Improved    DVT prophylaxis: Subcu heparin Code Status: Full Family Communication: None Disposition Plan: TBD   Consultants:  None  Procedures/Significant Events:  12/25 Echocardiogram:Left ventricle:  The cavity size was moderately dilated. There was mild concentric hypertrophy. Systolic function was mildly reduced. The estimated ejection fraction was in the range of 45% to 50%. Wall motion was normal; there were no  regional wall motion abnormalities. Features are consistent with a pseudonormal left ventricular filling pattern, with concomitant abnormal relaxation and increased filling pressure (grade 2 diastolic dysfunction). Doppler parameters are consistent with high ventricular  filling pressure.      I have personally reviewed and interpreted all radiology studies and my findings are as above.  VENTILATOR SETTINGS:    Cultures 12/22 blood positive E. coli bacteremia 12/22 urine positive E. coli 12/22 positive MRSA by PCR 12/25 blood pending     Antimicrobials: Anti-infectives (From admission, onward)   Start     Stop   03/21/17 1000  vancomycin (VANCOCIN) IVPB 750 mg/150 ml premix         03/21/17 0100  piperacillin-tazobactam (ZOSYN) IVPB 3.375 g  Status:  Discontinued     03/20/17 2118   03/20/17 2130  ceFEPIme (MAXIPIME) 2 g in dextrose 5 % 50 mL IVPB  Status:  Discontinued     03/20/17 2129   03/20/17 2130  ceFEPIme (MAXIPIME) 2 g in dextrose 5 % 50 mL IVPB         03/20/17 1845  vancomycin (VANCOCIN) 2,500 mg in sodium chloride 0.9 % 500 mL IVPB     03/21/17 0050   03/20/17 1845  piperacillin-tazobactam (ZOSYN) IVPB 3.375 g     03/20/17 1946       Devices    LINES / TUBES:      Continuous Infusions: . sodium chloride 125 mL/hr at 03/22/17 2352  . ceFEPime (MAXIPIME) IV Stopped (03/22/17 2216)  . sodium chloride    . vancomycin Stopped (03/23/17 0108)     Objective: Vitals:   03/22/17 0521 03/22/17 1512 03/22/17 2120 03/23/17 0524  BP: (!) 144/72 136/82 138/69 (!) 125/46  Pulse: 84 91 85 90  Resp: 18 18 14 14   Temp: 98.7 F (37.1 C) 98.2 F (36.8 C) 99.3 F (37.4 C) 99 F (37.2 C)  TempSrc: Oral Oral Oral Oral  SpO2: 100% 100% 100% 97%  Weight: 269 lb 10 oz (122.3 kg)     Height:        Intake/Output Summary (Last 24 hours) at 03/23/2017 0757 Last data filed at 03/22/2017 1845 Gross per 24 hour  Intake 840 ml  Output -  Net 840 ml   Filed Weights   03/21/17 0032 03/21/17 0514 03/22/17 0521  Weight: 261 lb 0.4 oz (118.4 kg) 262 lb 2 oz (118.9 kg) 269 lb 10 oz (122.3 kg)    Physical Exam:  General: A/O 4, No acute respiratory distress Neck:  Negative scars, masses, torticollis, lymphadenopathy,  JVD Lungs: Clear to auscultation bilaterally without wheezes or crackles Cardiovascular: Regular rate and rhythm without murmur gallop or rub normal S1 and S2 Abdomen: morbidly obese, negative abdominal pain, nondistended, positive soft, bowel sounds, no rebound, no ascites, no appreciable mass Extremities: No significant cyanosis, clubbing, or edema bilateral lower extremities Skin: Negative rashes, lesions, ulcers Psychiatric:  Negative depression, negative anxiety, negative fatigue, negative mania, mild mental retardation. Very pleasant  Central nervous system:  Cranial nerves II through XII intact, tongue/uvula midline, all extremities muscle strength 5/5, sensation intact throughout, negative dysarthria, negative expressive aphasia, negative receptive aphasia. .     Data Reviewed: Care during the described time interval was provided by me .  I have reviewed this patient's available data, including medical history, events of note, physical examination, and all test results as part of my evaluation.  CBC: Recent Labs  Lab 03/20/17 1820 03/21/17 0008 03/22/17 0240  WBC 16.2* 15.8*  11.4*  NEUTROABS 14.4* 14.2*  --   HGB 12.3* 12.0* 10.1*  HCT 37.1* 37.2* 31.7*  MCV 87.7 88.6 89.5  PLT 168 177 841*   Basic Metabolic Panel: Recent Labs  Lab 03/20/17 1820 03/21/17 0008 03/22/17 0240  NA 133* 137 135  K 3.7 3.7 3.8  CL 100* 104 107  CO2 21* 23 20*  GLUCOSE 372* 322* 208*  BUN 28* 26* 30*  CREATININE 1.61* 1.55* 1.61*  CALCIUM 8.5* 8.0* 7.6*   GFR: Estimated Creatinine Clearance: 52.8 mL/min (A) (by C-G formula based on SCr of 1.61 mg/dL (H)). Liver Function Tests: Recent Labs  Lab 03/20/17 1820  AST 29  ALT 22  ALKPHOS 52  BILITOT 1.0  PROT 7.2  ALBUMIN 3.1*   No results for input(s): LIPASE, AMYLASE in the last 168 hours. No results for input(s): AMMONIA in the last 168 hours. Coagulation Profile: Recent Labs  Lab 03/20/17 2139  INR 1.28   Cardiac  Enzymes: No results for input(s): CKTOTAL, CKMB, CKMBINDEX, TROPONINI in the last 168 hours. BNP (last 3 results) No results for input(s): PROBNP in the last 8760 hours. HbA1C: No results for input(s): HGBA1C in the last 72 hours. CBG: Recent Labs  Lab 03/21/17 2146 03/22/17 0802 03/22/17 1148 03/22/17 1656 03/22/17 2118  GLUCAP 171* 137* 213* 189* 229*   Lipid Profile: No results for input(s): CHOL, HDL, LDLCALC, TRIG, CHOLHDL, LDLDIRECT in the last 72 hours. Thyroid Function Tests: No results for input(s): TSH, T4TOTAL, FREET4, T3FREE, THYROIDAB in the last 72 hours. Anemia Panel: No results for input(s): VITAMINB12, FOLATE, FERRITIN, TIBC, IRON, RETICCTPCT in the last 72 hours. Sepsis Labs: Recent Labs  Lab 03/20/17 2110 03/20/17 2139 03/21/17 0001 03/22/17 1524  PROCALCITON  --  3.81  --   --   LATICACIDVEN 4.01* 2.8* 2.4* 1.8    Recent Results (from the past 240 hour(s))  Blood Culture (routine x 2)     Status: Abnormal (Preliminary result)   Collection Time: 03/20/17  6:20 PM  Result Value Ref Range Status   Specimen Description BLOOD LEFT FOREARM  Final   Special Requests   Final    BOTTLES DRAWN AEROBIC AND ANAEROBIC Blood Culture adequate volume   Culture  Setup Time   Final    GRAM NEGATIVE RODS IN BOTH AEROBIC AND ANAEROBIC BOTTLES CRITICAL RESULT CALLED TO, READ BACK BY AND VERIFIED WITH: R RUMBARGER,PHARMD AT 1004 03/21/17 BY L BENFIELD    Culture ESCHERICHIA COLI SUSCEPTIBILITIES TO FOLLOW  (A)  Final   Report Status PENDING  Incomplete  Blood Culture ID Panel (Reflexed)     Status: Abnormal   Collection Time: 03/20/17  6:20 PM  Result Value Ref Range Status   Enterococcus species NOT DETECTED NOT DETECTED Final   Listeria monocytogenes NOT DETECTED NOT DETECTED Final   Staphylococcus species NOT DETECTED NOT DETECTED Final   Staphylococcus aureus NOT DETECTED NOT DETECTED Final   Streptococcus species NOT DETECTED NOT DETECTED Final    Streptococcus agalactiae NOT DETECTED NOT DETECTED Final   Streptococcus pneumoniae NOT DETECTED NOT DETECTED Final   Streptococcus pyogenes NOT DETECTED NOT DETECTED Final   Acinetobacter baumannii NOT DETECTED NOT DETECTED Final   Enterobacteriaceae species DETECTED (A) NOT DETECTED Final    Comment: Enterobacteriaceae represent a large family of gram-negative bacteria, not a single organism. CRITICAL RESULT CALLED TO, READ BACK BY AND VERIFIED WITH: R RUMBARGER,PHARMD AT 1004 03/21/17 BY L BENFIELD    Enterobacter cloacae complex NOT DETECTED NOT DETECTED Final  Escherichia coli DETECTED (A) NOT DETECTED Final    Comment: CRITICAL RESULT CALLED TO, READ BACK BY AND VERIFIED WITH: R RUMBARGER,PHARMD AT 1004 03/21/17 BY L BENFIELD    Klebsiella oxytoca NOT DETECTED NOT DETECTED Final   Klebsiella pneumoniae NOT DETECTED NOT DETECTED Final   Proteus species NOT DETECTED NOT DETECTED Final   Serratia marcescens NOT DETECTED NOT DETECTED Final   Carbapenem resistance NOT DETECTED NOT DETECTED Final   Haemophilus influenzae NOT DETECTED NOT DETECTED Final   Neisseria meningitidis NOT DETECTED NOT DETECTED Final   Pseudomonas aeruginosa NOT DETECTED NOT DETECTED Final   Candida albicans NOT DETECTED NOT DETECTED Final   Candida glabrata NOT DETECTED NOT DETECTED Final   Candida krusei NOT DETECTED NOT DETECTED Final   Candida parapsilosis NOT DETECTED NOT DETECTED Final   Candida tropicalis NOT DETECTED NOT DETECTED Final  Blood Culture (routine x 2)     Status: None (Preliminary result)   Collection Time: 03/20/17  6:41 PM  Result Value Ref Range Status   Specimen Description BLOOD BLOOD RIGHT HAND  Final   Special Requests   Final    BOTTLES DRAWN AEROBIC AND ANAEROBIC Blood Culture adequate volume   Culture  Setup Time   Final    GRAM NEGATIVE RODS AEROBIC BOTTLE ONLY CRITICAL RESULT CALLED TO, READ BACK BY AND VERIFIED WITH: H BAIRD,PHARMD AT 1914 03/21/17 BY L BENFIELD     Culture GRAM NEGATIVE RODS  Final   Report Status PENDING  Incomplete  Urine Culture     Status: Abnormal   Collection Time: 03/20/17  9:11 PM  Result Value Ref Range Status   Specimen Description URINE, RANDOM  Final   Special Requests NONE  Final   Culture >=100,000 COLONIES/mL ESCHERICHIA COLI (A)  Final   Report Status 03/23/2017 FINAL  Final   Organism ID, Bacteria ESCHERICHIA COLI (A)  Final      Susceptibility   Escherichia coli - MIC*    AMPICILLIN 8 SENSITIVE Sensitive     CEFAZOLIN <=4 SENSITIVE Sensitive     CEFTRIAXONE <=1 SENSITIVE Sensitive     CIPROFLOXACIN >=4 RESISTANT Resistant     GENTAMICIN <=1 SENSITIVE Sensitive     IMIPENEM <=0.25 SENSITIVE Sensitive     NITROFURANTOIN <=16 SENSITIVE Sensitive     TRIMETH/SULFA <=20 SENSITIVE Sensitive     AMPICILLIN/SULBACTAM <=2 SENSITIVE Sensitive     PIP/TAZO <=4 SENSITIVE Sensitive     Extended ESBL NEGATIVE Sensitive     * >=100,000 COLONIES/mL ESCHERICHIA COLI  MRSA PCR Screening     Status: Abnormal   Collection Time: 03/20/17 11:10 PM  Result Value Ref Range Status   MRSA by PCR POSITIVE (A) NEGATIVE Final    Comment:        The GeneXpert MRSA Assay (FDA approved for NASAL specimens only), is one component of a comprehensive MRSA colonization surveillance program. It is not intended to diagnose MRSA infection nor to guide or monitor treatment for MRSA infections. RESULT CALLED TO, READ BACK BY AND VERIFIED WITH: Lily Peer [phone] @0400  [facility]          Radiology Studies: No results found.      Scheduled Meds: . atorvastatin  20 mg Oral q1800  . Chlorhexidine Gluconate Cloth  6 each Topical Q0600  . docusate sodium  100 mg Oral BID  . escitalopram  10 mg Oral Daily  . famotidine  20 mg Oral BID  . heparin  5,000 Units Subcutaneous Q8H  . insulin aspart  0-15 Units Subcutaneous TID WC  . insulin aspart  0-5 Units Subcutaneous QHS  . insulin detemir  30 Units Subcutaneous BID  .  Melatonin  9 mg Oral QHS  . mupirocin ointment  1 application Nasal BID  . omega-3 acid ethyl esters  1 g Oral BID  . potassium chloride SA  20 mEq Oral Daily  . vitamin B-12  1,000 mcg Oral Daily   Continuous Infusions: . sodium chloride 125 mL/hr at 03/22/17 2352  . ceFEPime (MAXIPIME) IV Stopped (03/22/17 2216)  . sodium chloride    . vancomycin Stopped (03/23/17 0108)     LOS: 3 days    Time spent:40 min    Tiwatope Emmitt, Geraldo Docker, MD [facility] Pager [phone]  If 7PM-7AM, please contact night-coverage [email or web address] Password [facility] 03/23/2017, 7:57 AM

## 2017-03-23 NOTE — Progress Notes (Signed)
  Echocardiogram 2D Echocardiogram has been performed.  Johny Chess 03/23/2017, 9:54 AM

## 2017-03-24 ENCOUNTER — Encounter (HOSPITAL_COMMUNITY): Payer: Self-pay | Admitting: Internal Medicine

## 2017-03-24 DIAGNOSIS — N183 Chronic kidney disease, stage 3 unspecified: Secondary | ICD-10-CM

## 2017-03-24 DIAGNOSIS — A4151 Sepsis due to Escherichia coli [E. coli]: Principal | ICD-10-CM

## 2017-03-24 DIAGNOSIS — E1129 Type 2 diabetes mellitus with other diabetic kidney complication: Secondary | ICD-10-CM

## 2017-03-24 DIAGNOSIS — E66813 Obesity, class 3: Secondary | ICD-10-CM | POA: Diagnosis present

## 2017-03-24 DIAGNOSIS — B9620 Unspecified Escherichia coli [E. coli] as the cause of diseases classified elsewhere: Secondary | ICD-10-CM | POA: Diagnosis present

## 2017-03-24 DIAGNOSIS — D510 Vitamin B12 deficiency anemia due to intrinsic factor deficiency: Secondary | ICD-10-CM

## 2017-03-24 DIAGNOSIS — F79 Unspecified intellectual disabilities: Secondary | ICD-10-CM

## 2017-03-24 DIAGNOSIS — N390 Urinary tract infection, site not specified: Secondary | ICD-10-CM

## 2017-03-24 HISTORY — DX: Obesity, class 3: E66.813

## 2017-03-24 HISTORY — DX: Chronic kidney disease, stage 3 unspecified: N18.30

## 2017-03-24 HISTORY — DX: Type 2 diabetes mellitus with other diabetic kidney complication: E11.29

## 2017-03-24 HISTORY — DX: Morbid (severe) obesity due to excess calories: E66.01

## 2017-03-24 LAB — BASIC METABOLIC PANEL
Anion gap: 10 (ref 5–15)
BUN: 25 mg/dL — AB (ref 6–20)
CHLORIDE: 107 mmol/L (ref 101–111)
CO2: 20 mmol/L — AB (ref 22–32)
CREATININE: 1.33 mg/dL — AB (ref 0.61–1.24)
Calcium: 8.1 mg/dL — ABNORMAL LOW (ref 8.9–10.3)
GFR calc Af Amer: >60 mL/min (ref >60)
GFR calc non Af Amer: 52 mL/min — ABNORMAL LOW (ref >60)
Glucose, Bld: 178 mg/dL — ABNORMAL HIGH (ref 65–99)
Potassium: 3.4 mmol/L — ABNORMAL LOW (ref 3.5–5.1)
SODIUM: 137 mmol/L (ref 135–145)

## 2017-03-24 LAB — GLUCOSE, CAPILLARY
GLUCOSE-CAPILLARY: 140 mg/dL — AB (ref 65–99)
Glucose-Capillary: 170 mg/dL — ABNORMAL HIGH (ref 65–99)
Glucose-Capillary: 188 mg/dL — ABNORMAL HIGH (ref 65–99)

## 2017-03-24 LAB — CBC
HCT: 32.5 % — ABNORMAL LOW (ref 39.0–52.0)
Hemoglobin: 10.7 g/dL — ABNORMAL LOW (ref 13.0–17.0)
MCH: 28.4 pg (ref 26.0–34.0)
MCHC: 32.9 g/dL (ref 30.0–36.0)
MCV: 86.2 fL (ref 78.0–100.0)
PLATELETS: 181 10*3/uL (ref 150–400)
RBC: 3.77 MIL/uL — ABNORMAL LOW (ref 4.22–5.81)
RDW: 13.8 % (ref 11.5–15.5)
WBC: 9.1 10*3/uL (ref 4.0–10.5)

## 2017-03-24 LAB — MAGNESIUM: Magnesium: 1.9 mg/dL (ref 1.7–2.4)

## 2017-03-24 MED ORDER — CEFUROXIME AXETIL 500 MG PO TABS
500.0000 mg | ORAL_TABLET | Freq: Two times a day (BID) | ORAL | 0 refills | Status: AC
Start: 2017-03-24 — End: 2017-03-27

## 2017-03-24 MED ORDER — CEFUROXIME AXETIL 500 MG PO TABS
500.0000 mg | ORAL_TABLET | Freq: Two times a day (BID) | ORAL | Status: DC
  Administered 2017-03-24: 500 mg via ORAL
  Filled 2017-03-24 (×2): qty 1

## 2017-03-24 MED ORDER — DEXTROSE 5 % IV SOLN
2.0000 g | INTRAVENOUS | Status: DC
  Filled 2017-03-24: qty 2

## 2017-03-24 MED ORDER — POTASSIUM CHLORIDE CRYS ER 20 MEQ PO TBCR: 40.0000 meq | EXTENDED_RELEASE_TABLET | Freq: Every day | ORAL | Status: DC

## 2017-03-24 NOTE — Clinical Social Work Placement (Signed)
   CLINICAL SOCIAL WORK PLACEMENT  NOTE  Date:  03/24/2017  Patient Details  Name: Mark Chang MRN: [id number] Date of Birth: 1946/01/28  Clinical Social Work is seeking post-discharge placement for this patient at the [place] level of care (*CSW will initial, date and re-position this form in  chart as items are completed):  Yes   Patient/family provided with [place] Work Department's list of facilities offering this level of care within the geographic area requested by the patient (or if unable, by the patient's family).  Yes   Patient/family informed of their freedom to choose among providers that offer the needed level of care, that participate in Medicare, Medicaid or managed care program needed by the patient, have an available bed and are willing to accept the patient.  Yes   Patient/family informed of [place]'s ownership interest in [facility] and [facility], as well as of the fact that they are under no obligation to receive care at these facilities.  PASRR submitted to EDS on       PASRR number received on       Existing PASRR number confirmed on 03/24/17     FL2 transmitted to all facilities in geographic area requested by pt/family on 03/24/17     FL2 transmitted to all facilities within larger geographic area on       Patient informed that his/her managed care company has contracts with or will negotiate with certain facilities, including the following:  Other - please specify in the comment section below:     Yes   Patient/family informed of bed offers received.  Patient chooses bed at Other - please specify in the comment section below:     Physician recommends and patient chooses bed at      Patient to be transferred to Other - please specify in the comment section below:([facility]) on 03/24/17.  Patient to be transferred to facility by [facility]     Patient family notified on 03/24/17 of transfer.  Name  of family member notified:        PHYSICIAN       Additional Comment:    _______________________________________________ Estanislado Emms, LCSW 03/24/2017, 12:45 PM

## 2017-03-24 NOTE — NC FL2 (Signed)
[place] LEVEL OF CARE SCREENING TOOL     IDENTIFICATION  Patient Name: Mark Chang Birthdate: 09/04/1945 Sex: male Admission Date (Current Location): 03/20/2017  [facility] and [place] Number:  Herbalist and Address:  [facility] [place] [address], [place], [place] [address]      Provider Number: [id number]  Attending Physician Name and Address:  Rama, Venetia Maxon, MD  Relative Name and Phone Number:  Loyed Wilmes, brother, [phone]    Current Level of Care: Hospital Recommended Level of Care: [place] Prior Approval Number:    Date Approved/Denied:   PASRR Number: [id number]  Discharge Plan: Domiciliary (Rest home)(ALF)    Current Diagnoses: Patient Active Problem List   Diagnosis Date Noted  . Sepsis due to Escherichia coli (E. coli) ([place]) 03/24/2017  . E. coli UTI 03/24/2017  . Obesity, Class III, BMI 40-49.9 (morbid obesity) ([place]) 03/24/2017  . DM (diabetes mellitus), type 2 with renal complications ([place]) 37/34/2876  . CKD (chronic kidney disease), stage III ([place]) 03/24/2017  . Mental retardation 03/20/2017  . Hypertension 03/20/2017  . AKI (acute kidney injury) ([place]) 03/20/2017  . Pernicious anemia 03/20/2017    Orientation RESPIRATION BLADDER Height & Weight     Self, Situation, Place  Normal Continent Weight: 269 lb 10 oz (122.3 kg) Height:  5\' 8"  (172.7 cm)  BEHAVIORAL SYMPTOMS/MOOD NEUROLOGICAL BOWEL NUTRITION STATUS      Continent Diet(please see DC summary)  AMBULATORY STATUS COMMUNICATION OF NEEDS Skin   Independent Verbally Normal                       Personal Care Assistance Level of Assistance  Bathing, Feeding, Dressing Bathing Assistance: Limited assistance Feeding assistance: Independent Dressing Assistance: Limited assistance     Functional Limitations Info  Sight, Hearing, Speech Sight Info: Adequate Hearing Info: Adequate Speech Info: Adequate    SPECIAL  CARE FACTORS FREQUENCY  PT (By licensed PT)     PT Frequency: no PT follow up              Contractures Contractures Info: Not present    Additional Factors Info  Code Status, Allergies Code Status Info: Full Allergies Info: No Known Allergies           Current Medications (03/24/2017):  This is the current hospital active medication list Current Facility-Administered Medications  Medication Dose Route Frequency Provider Last Rate Last Dose  . 0.9 %  sodium chloride infusion   Intravenous Continuous Allie Bossier, MD 75 mL/hr at 03/23/17 1756    . acetaminophen (TYLENOL) tablet 650 mg  650 mg Oral Q6H PRN Opyd, Ilene Qua, MD   650 mg at 03/21/17 1709   Or  . acetaminophen (TYLENOL) suppository 650 mg  650 mg Rectal Q6H PRN Opyd, Ilene Qua, MD      . atorvastatin (LIPITOR) tablet 40 mg  40 mg Oral q1800 Allie Bossier, MD   40 mg at 03/23/17 1802  . cefUROXime (CEFTIN) tablet 500 mg  500 mg Oral BID WC Rama, Venetia Maxon, MD      . Chlorhexidine Gluconate Cloth 2 % PADS 6 each  6 each Topical Q0600 Opyd, Ilene Qua, MD   6 each at 03/24/17 0500  . docusate sodium (COLACE) capsule 100 mg  100 mg Oral BID Vianne Bulls, MD   100 mg at 03/24/17 0836  . escitalopram (LEXAPRO) tablet 10 mg  10 mg Oral  Daily Opyd, Ilene Qua, MD   10 mg at 03/24/17 0834  . famotidine (PEPCID) tablet 20 mg  20 mg Oral BID Opyd, Ilene Qua, MD   20 mg at 03/24/17 0836  . heparin injection 5,000 Units  5,000 Units Subcutaneous Q8H Opyd, Ilene Qua, MD   5,000 Units at 03/24/17 0500  . HYDROcodone-acetaminophen (NORCO/VICODIN) 5-325 MG per tablet 1-2 tablet  1-2 tablet Oral Q4H PRN Opyd, Ilene Qua, MD      . insulin aspart (novoLOG) injection 0-15 Units  0-15 Units Subcutaneous TID WC Opyd, Ilene Qua, MD   2 Units at 03/24/17 [phone]  . insulin aspart (novoLOG) injection 0-5 Units  0-5 Units Subcutaneous QHS Vianne Bulls, MD   3 Units at 03/23/17 2141  . insulin detemir (LEVEMIR) injection 32 Units  32  Units Subcutaneous BID Allie Bossier, MD   32 Units at 03/24/17 [phone]  . Melatonin TABS 9 mg  9 mg Oral QHS Opyd, Ilene Qua, MD   9 mg at 03/23/17 2142  . mupirocin ointment (BACTROBAN) 2 % 1 application  1 application Nasal BID Opyd, Ilene Qua, MD   1 application at 54/09/81 [phone]  . omega-3 acid ethyl esters (LOVAZA) capsule 1 g  1 g Oral BID Opyd, Ilene Qua, MD   1 g at 03/24/17 0835  . ondansetron (ZOFRAN) tablet 4 mg  4 mg Oral Q6H PRN Opyd, Ilene Qua, MD       Or  . ondansetron (ZOFRAN) injection 4 mg  4 mg Intravenous Q6H PRN Opyd, Ilene Qua, MD      . polyethylene glycol (MIRALAX / GLYCOLAX) packet 17 g  17 g Oral Daily PRN Opyd, Ilene Qua, MD      . polyvinyl alcohol (LIQUIFILM TEARS) 1.4 % ophthalmic solution 1 drop  1 drop Both Eyes BID PRN Opyd, Ilene Qua, MD      . Derrill Memo ON 03/25/2017] potassium chloride SA (K-DUR,KLOR-CON) CR tablet 40 mEq  40 mEq Oral Daily Rama, Christina P, MD      . sodium chloride 0.9 % bolus 750 mL  750 mL Intravenous Once Opyd, Ilene Qua, MD      . vitamin B-12 (CYANOCOBALAMIN) tablet 1,000 mcg  1,000 mcg Oral Daily Opyd, Ilene Qua, MD   1,000 mcg at 03/24/17 7829     Discharge Medications: Please see discharge summary for a list of discharge medications.  Relevant Imaging Results:  Relevant Lab Results:   Additional Information SSN: [id number]  Estanislado Emms, LCSW

## 2017-03-24 NOTE — Progress Notes (Signed)
Patient will discharge back to [facility]. Anticipated discharge date: 03/24/17 Family notified: Could not reach family; notified patient and facility Transportation by: [facility]  Nurse to call report to [phone].   CSW signing off.  Estanislado Emms, [place]  Clinical Social Worker

## 2017-03-24 NOTE — Care Management Note (Signed)
Case Management Note  Patient Details  Name: Mark Chang MRN: [id number] Date of Birth: 04-15-1945  Subjective/Objective:      Admitted with Sepsis, Hx of Diabetes type 2 uncontrolled with complication, HTN, HLD, COPD, pernicious anemia, mental Retardation.    Action/Plan: Transition back to ALF today. CSW managing disposition to facility.  Expected Discharge Date:  03/24/17               Expected Discharge Plan:  Assisted Living / Rest Home([facility])  In-House Referral:  Clinical Social Work  Discharge planning Services  CM Consult    Status of Service:  Completed, signed off  If discussed at [employer], dates discussed:    Additional Comments:  Sharin Mons, RN 03/24/2017, 11:03 AM

## 2017-03-24 NOTE — Clinical Social Work Note (Signed)
Clinical Social Work Assessment  Patient Details  Name: Mark Chang MRN: [id number] Date of Birth: [birth year]  Date of referral:  03/24/17               Reason for consult:  Facility Placement(from [facility])                Permission sought to share information with:  Facility Sport and exercise psychologist, Family Supports Permission granted to share information::     Name::     Mark Chang  Agency::  [facility]  Relationship::  brother  Contact Information:  [phone]  Housing/Transportation Living arrangements for the past 2 months:  [place] of Information:  Facility, Patient Patient Interpreter Needed:  None Criminal Activity/Legal Involvement Pertinent to Current Situation/Hospitalization:  No - Comment as needed Significant Relationships:  Siblings Lives with:  Facility Resident Do you feel safe going back to the place where you live?  Yes Need for family participation in patient care:  Yes (Comment)  Care giving concerns: Patient from [place]. PT recommending no follow up.   Social Worker assessment / plan: CSW spoke to [place] at ALF and to patient at bedside. Patient agreeable to return to ALF. CSW sent discharge summary and FL2 to ALF and [facility] reviewed. ALF requesting changes to DC summary; CSW paged MD. CSW called patient's brother, but brother's number was non-working number. CSW to support with discharge.  Employment status:  Disabled (Comment on whether or not currently receiving Disability) Insurance information:  Medicaid In [place], [place] PT Recommendations:  No Follow Up Information / Referral to community resources:  Other (Comment Required)(back to ALF)  Patient/Family's Response to care: Patient appreciative of care.  Patient/Family's Understanding of and Emotional Response to Diagnosis, Current Treatment, and Prognosis: Patient agreeable to return to ALF.  Emotional Assessment Appearance:  Appears stated  age Attitude/Demeanor/Rapport:  Other(appropriate) Affect (typically observed):  Calm Orientation:  Oriented to Self, Oriented to Place, Oriented to Situation Alcohol / Substance use:  Not Applicable Psych involvement (Current and /or in the community):  No (Comment)  Discharge Needs  Concerns to be addressed:  Care Coordination, Discharge Planning Concerns Readmission within the last 30 days:  No Current discharge risk:  None Barriers to Discharge:  No Barriers Identified   Estanislado Emms, LCSW 03/24/2017, 12:42 PM

## 2017-03-24 NOTE — Discharge Summary (Addendum)
Physician Discharge Summary  Mark Chang ZOX:[id number] DOB: [birth year] DOA: 03/20/2017  PCP: Terrill Mohr, NP  Admit date: 03/20/2017 Discharge date: 03/24/2017  Admitted From: ALF Discharge disposition: ALF   Recommendations for Outpatient Follow-Up:   1. Per Dr. Megan Salon, a 5-7 day course of antibiotics appropriate for E. Coli bacteremia from UTI. 2. Hemoglobin A1c 9.9%, close outpatient F/U of glycemic control recommended.   Discharge Diagnosis:   Principal Problem:   Sepsis due to Escherichia coli (E. coli) (HCC) Active Problems:   Mental retardation   Hypertension   AKI (acute kidney injury) (HCC)/stage III CKD   Pernicious anemia   Diabetes mellitus with renal complicatiosn (HCC)   E. coli UTI   Morbid obesity  Discharge Condition: Improved.  Diet recommendation: Carbohydrate-modified. No concentrated sweets.  Code Status: Full   History of Present Illness:   71 y.o.male with aPMHx of diabetes type 2, uncontrolled, with complication, HTN, HLD, COPD, pernicious anemia, mental retardation, who was admitted 03/20/17 from his nursing facility for evaluation lethargy and fever. Patient was noted to be less active and less engaged at his nursing facility and seemed to be warm to the touch. Temperature of 102 F was documented and EMS was called out for evaluation.   Hospital Course by Problem:   Principal Problem:   Sepsis due to Escherichia coli (E. coli) (HCC)/E. coli UTI Blood cultures positive for Escherichia coli. After speaking with Dr. Megan Salon from [place], antibiotics narrowed to Ceftin for a planned treatment course of 3 additional days (s/p 4 days of broad spectrum IV antibiotics). Repeat surveillance cultures pending.   Active Problems:   Hypokalemia Placed on supplementation while in the hospital.    Mental retardation D/C back to SNF.    Hypertension OK to resume lasix at discharge.    AKI (acute kidney injury) (HCC)/stage III  CKD Baseline creatinine is around 1.2.  Creatinine elevated on admission to 1.61.  Almost back to baseline at discharge.    Pernicious anemia Hemoglobin stable.    Diabetes mellitus with renal complications (HCC) Resume usual meds. Hemoglobin A1c is 9.9%, needs better outpatient glycemic control.    Morbid obesity Body mass index is 41 kg/m.    Medical Consultants:    None.   Discharge Exam:   Vitals:   03/23/17 2350 03/24/17 0508  BP: (!) 135/56 (!) 133/55  Pulse: 78 73  Resp: 20 (!) 22  Temp:  98.1 F (36.7 C)  SpO2: 99% 100%   Vitals:   03/23/17 1430 03/23/17 2050 03/23/17 2350 03/24/17 0508  BP: (!) 149/57 (!) 175/76 (!) 135/56 (!) 133/55  Pulse: 85 76 78 73  Resp: 16 20 20  (!) 22  Temp: 98.3 F (36.8 C) 97.9 F (36.6 C)  98.1 F (36.7 C)  TempSrc: Oral Oral  Oral  SpO2: 100% 100% 99% 100%  Weight:      Height:        General exam: Appears calm and comfortable. Sitting up in chair. Respiratory system: Clear to auscultation. Respiratory effort normal. Cardiovascular system: S1 & S2 heard, RRR. No JVD,  rubs, gallops or clicks. No murmurs. Gastrointestinal system: Abdomen is nondistended, obese, soft and nontender. No organomegaly or masses felt. Normal bowel sounds heard. Central nervous system: Alert and oriented. No focal neurological deficits. Extremities: No clubbing,  or cyanosis. 1+ LE edema. Skin: No rashes, lesions or ulcers. Psychiatry: Judgement and insight appear normal. Mood & affect appropriate.    The results of significant diagnostics from this hospitalization (  including imaging, microbiology, ancillary and laboratory) are listed below for reference.     Procedures and Diagnostic Studies:   Dg Chest 2 View  Result Date: 03/20/2017 CLINICAL DATA:  Initial evaluation for acute fever, altered mental status. EXAM: CHEST  2 VIEW COMPARISON:  Prior radiograph from 07/22/2016. FINDINGS: Mild cardiomegaly, stable.  Mediastinal silhouette  normal. Lungs hypoinflated. Mild patchy left basilar opacity, favored to reflect atelectasis, although infiltrate could be considered in the correct clinical setting. No other focal airspace disease. No pulmonary edema or pleural effusion. No pneumothorax. Osseous structures within normal limits. IMPRESSION: Shallow lung inflation with mild patchy left left lower lobe opacity. Atelectasis is favored. Infectious infiltrate could be considered in the correct clinical setting. Electronically Signed   By: Jeannine Boga M.D.   On: 03/20/2017 19:29     Labs:   Basic Metabolic Panel: Recent Labs  Lab 03/20/17 1820 03/21/17 0008 03/22/17 0240 03/23/17 0816 03/24/17 0238  NA 133* 137 135 139 137  K 3.7 3.7 3.8 3.7 3.4*  CL 100* 104 107 109 107  CO2 21* 23 20* 22 20*  GLUCOSE 372* 322* 208* 125* 178*  BUN 28* 26* 30* 24* 25*  CREATININE 1.61* 1.55* 1.61* 1.41* 1.33*  CALCIUM 8.5* 8.0* 7.6* 8.0* 8.1*  MG  --   --   --  1.9 1.9   GFR Estimated Creatinine Clearance: 63.9 mL/min (A) (by C-G formula based on SCr of 1.33 mg/dL (H)). Liver Function Tests: Recent Labs  Lab 03/20/17 1820  AST 29  ALT 22  ALKPHOS 52  BILITOT 1.0  PROT 7.2  ALBUMIN 3.1*   Coagulation profile Recent Labs  Lab 03/20/17 2139  INR 1.28    CBC: Recent Labs  Lab 03/20/17 1820 03/21/17 0008 03/22/17 0240 03/23/17 0816 03/24/17 0238  WBC 16.2* 15.8* 11.4* 9.1 9.1  NEUTROABS 14.4* 14.2*  --   --   --   HGB 12.3* 12.0* 10.1* 11.5* 10.7*  HCT 37.1* 37.2* 31.7* 34.4* 32.5*  MCV 87.7 88.6 89.5 86.9 86.2  PLT 168 177 141* 133* 181   CBG: Recent Labs  Lab 03/23/17 1230 03/23/17 1656 03/23/17 2036 03/23/17 2049 03/24/17 0810  GLUCAP 178* 208* 269* 268* 140*   Hgb A1c Recent Labs    03/23/17 0816  HGBA1C 9.9*   Lipid Profile Recent Labs    03/23/17 0816  CHOL 144  HDL 18*  LDLCALC 60  TRIG 330*  CHOLHDL 8.0   Microbiology Recent Results (from the past 240 hour(s))  Blood  Culture (routine x 2)     Status: Abnormal   Collection Time: 03/20/17  6:20 PM  Result Value Ref Range Status   Specimen Description BLOOD LEFT FOREARM  Final   Special Requests   Final    BOTTLES DRAWN AEROBIC AND ANAEROBIC Blood Culture adequate volume   Culture  Setup Time   Final    GRAM NEGATIVE RODS IN BOTH AEROBIC AND ANAEROBIC BOTTLES CRITICAL RESULT CALLED TO, READ BACK BY AND VERIFIED WITH: R RUMBARGER,PHARMD AT 1004 03/21/17 BY L BENFIELD    Culture ESCHERICHIA COLI (A)  Final   Report Status 03/23/2017 FINAL  Final   Organism ID, Bacteria ESCHERICHIA COLI  Final      Susceptibility   Escherichia coli - MIC*    AMPICILLIN 4 SENSITIVE Sensitive     CEFAZOLIN <=4 SENSITIVE Sensitive     CEFEPIME <=1 SENSITIVE Sensitive     CEFTAZIDIME <=1 SENSITIVE Sensitive     CEFTRIAXONE <=1 SENSITIVE  Sensitive     CIPROFLOXACIN >=4 RESISTANT Resistant     GENTAMICIN <=1 SENSITIVE Sensitive     IMIPENEM <=0.25 SENSITIVE Sensitive     TRIMETH/SULFA <=20 SENSITIVE Sensitive     AMPICILLIN/SULBACTAM <=2 SENSITIVE Sensitive     PIP/TAZO <=4 SENSITIVE Sensitive     Extended ESBL NEGATIVE Sensitive     * ESCHERICHIA COLI  Blood Culture ID Panel (Reflexed)     Status: Abnormal   Collection Time: 03/20/17  6:20 PM  Result Value Ref Range Status   Enterococcus species NOT DETECTED NOT DETECTED Final   Listeria monocytogenes NOT DETECTED NOT DETECTED Final   Staphylococcus species NOT DETECTED NOT DETECTED Final   Staphylococcus aureus NOT DETECTED NOT DETECTED Final   Streptococcus species NOT DETECTED NOT DETECTED Final   Streptococcus agalactiae NOT DETECTED NOT DETECTED Final   Streptococcus pneumoniae NOT DETECTED NOT DETECTED Final   Streptococcus pyogenes NOT DETECTED NOT DETECTED Final   Acinetobacter baumannii NOT DETECTED NOT DETECTED Final   Enterobacteriaceae species DETECTED (A) NOT DETECTED Final    Comment: Enterobacteriaceae represent a large family of gram-negative  bacteria, not a single organism. CRITICAL RESULT CALLED TO, READ BACK BY AND VERIFIED WITH: R RUMBARGER,PHARMD AT 1004 03/21/17 BY L BENFIELD    Enterobacter cloacae complex NOT DETECTED NOT DETECTED Final   Escherichia coli DETECTED (A) NOT DETECTED Final    Comment: CRITICAL RESULT CALLED TO, READ BACK BY AND VERIFIED WITH: R RUMBARGER,PHARMD AT 1004 03/21/17 BY L BENFIELD    Klebsiella oxytoca NOT DETECTED NOT DETECTED Final   Klebsiella pneumoniae NOT DETECTED NOT DETECTED Final   Proteus species NOT DETECTED NOT DETECTED Final   Serratia marcescens NOT DETECTED NOT DETECTED Final   Carbapenem resistance NOT DETECTED NOT DETECTED Final   Haemophilus influenzae NOT DETECTED NOT DETECTED Final   Neisseria meningitidis NOT DETECTED NOT DETECTED Final   Pseudomonas aeruginosa NOT DETECTED NOT DETECTED Final   Candida albicans NOT DETECTED NOT DETECTED Final   Candida glabrata NOT DETECTED NOT DETECTED Final   Candida krusei NOT DETECTED NOT DETECTED Final   Candida parapsilosis NOT DETECTED NOT DETECTED Final   Candida tropicalis NOT DETECTED NOT DETECTED Final  Blood Culture (routine x 2)     Status: Abnormal   Collection Time: 03/20/17  6:41 PM  Result Value Ref Range Status   Specimen Description BLOOD BLOOD RIGHT HAND  Final   Special Requests   Final    BOTTLES DRAWN AEROBIC AND ANAEROBIC Blood Culture adequate volume   Culture  Setup Time   Final    GRAM NEGATIVE RODS AEROBIC BOTTLE ONLY CRITICAL RESULT CALLED TO, READ BACK BY AND VERIFIED WITH: H BAIRD,PHARMD AT 1914 03/21/17 BY L BENFIELD    Culture (A)  Final    ESCHERICHIA COLI SUSCEPTIBILITIES PERFORMED ON PREVIOUS CULTURE WITHIN THE LAST 5 DAYS.    Report Status 03/23/2017 FINAL  Final  Urine Culture     Status: Abnormal   Collection Time: 03/20/17  9:11 PM  Result Value Ref Range Status   Specimen Description URINE, RANDOM  Final   Special Requests NONE  Final   Culture >=100,000 COLONIES/mL ESCHERICHIA COLI  (A)  Final   Report Status 03/23/2017 FINAL  Final   Organism ID, Bacteria ESCHERICHIA COLI (A)  Final      Susceptibility   Escherichia coli - MIC*    AMPICILLIN 8 SENSITIVE Sensitive     CEFAZOLIN <=4 SENSITIVE Sensitive     CEFTRIAXONE <=1 SENSITIVE Sensitive  CIPROFLOXACIN >=4 RESISTANT Resistant     GENTAMICIN <=1 SENSITIVE Sensitive     IMIPENEM <=0.25 SENSITIVE Sensitive     NITROFURANTOIN <=16 SENSITIVE Sensitive     TRIMETH/SULFA <=20 SENSITIVE Sensitive     AMPICILLIN/SULBACTAM <=2 SENSITIVE Sensitive     PIP/TAZO <=4 SENSITIVE Sensitive     Extended ESBL NEGATIVE Sensitive     * >=100,000 COLONIES/mL ESCHERICHIA COLI  MRSA PCR Screening     Status: Abnormal   Collection Time: 03/20/17 11:10 PM  Result Value Ref Range Status   MRSA by PCR POSITIVE (A) NEGATIVE Final    Comment:        The GeneXpert MRSA Assay (FDA approved for NASAL specimens only), is one component of a comprehensive MRSA colonization surveillance program. It is not intended to diagnose MRSA infection nor to guide or monitor treatment for MRSA infections. RESULT CALLED TO, READ BACK BY AND VERIFIED WITH: Lily Peer [id number] @0400  THANEY      Discharge Instructions:   Discharge Instructions    Call MD for:  extreme fatigue   Complete by:  As directed    Call MD for:  persistant nausea and vomiting   Complete by:  As directed    Call MD for:  severe uncontrolled pain   Complete by:  As directed    Call MD for:  temperature >100.4   Complete by:  As directed    Diet Carb Modified   Complete by:  As directed    Increase activity slowly   Complete by:  As directed      Allergies as of 03/24/2017   No Known Allergies     Medication List    TAKE these medications   acetaminophen 500 MG tablet Commonly known as:  TYLENOL Take 500 mg by mouth 3 (three) times daily.   atorvastatin 20 MG tablet Commonly known as:  LIPITOR Take 20 mg by mouth at bedtime.   cefUROXime 500 MG  tablet Commonly known as:  CEFTIN Take 1 tablet (500 mg total) by mouth 2 (two) times daily with a meal for 3 days.   docusate sodium 100 MG capsule Commonly known as:  COLACE Take 100 mg by mouth 2 (two) times daily.   DIOCTO 150 MG/15ML syrup Generic drug:  Docusate Sodium 10 mg See admin instructions. Instil 1 ml (10 mg) into each ear and wait 10 minutes, then irrigate with warm water - repeat every month   escitalopram 10 MG tablet Commonly known as:  LEXAPRO Take 10 mg by mouth daily.   furosemide 40 MG tablet Commonly known as:  LASIX Take 40 mg by mouth daily.   glipiZIDE 5 MG 24 hr tablet Commonly known as:  GLUCOTROL XL Take 5 mg by mouth daily.   insulin aspart 100 UNIT/ML injection Commonly known as:  novoLOG CBG 70 - 120: 0 units CBG 121 - 150: 2 units CBG 151 - 200: 3 units CBG 201 - 250: 5 units CBG 251 - 300: 8 units CBG 301 - 350: 11 units CBG 351 - 400: 15 units What changed:    how much to take  how to take this  when to take this  additional instructions   LEVEMIR FLEXTOUCH 100 UNIT/ML Pen Generic drug:  Insulin Detemir Inject 30-40 Units into the skin See admin instructions. Inject 40 units subcutaneously every morning and 30 units at bedtime   linagliptin 5 MG Tabs tablet Commonly known as:  TRADJENTA Take 5 mg by mouth daily.  lisinopril 40 MG tablet Commonly known as:  PRINIVIL,ZESTRIL Take 40 mg by mouth daily.   Melatonin 5 MG Caps Take 10 mg by mouth at bedtime.   omega-3 acid ethyl esters 1 g capsule Commonly known as:  LOVAZA Take 1 g by mouth 2 (two) times daily.   potassium chloride SA 20 MEQ tablet Commonly known as:  K-DUR,KLOR-CON Take 20 mEq by mouth daily.   ranitidine 150 MG tablet Commonly known as:  ZANTAC Take 150 mg by mouth 2 (two) times daily.   TEARS AGAIN Soln Place 1 drop into both eyes 2 (two) times daily as needed (dry eyes).   vitamin B-12 1000 MCG tablet Commonly known as:  CYANOCOBALAMIN Take  1,000 mcg by mouth daily.         Time coordinating discharge: 35 minutes.  SignedMargreta Journey Rama  Pager [phone] [facility] 03/24/2017, 10:25 AM

## 2017-03-24 NOTE — Progress Notes (Signed)
Patient discharge teaching given, including activity, diet, follow-up appoints, and medications. Patient verbalized understanding of all discharge instructions. IV access was d/c'd. Vitals are stable. Skin is intact except as charted in most recent assessments. Pt to be escorted out by [facility], to be transferred to [place].

## 2017-03-28 LAB — CULTURE, BLOOD (ROUTINE X 2)
CULTURE: NO GROWTH
Culture: NO GROWTH
SPECIAL REQUESTS: ADEQUATE
Special Requests: ADEQUATE

## 2019-07-03 ENCOUNTER — Emergency Department (HOSPITAL_COMMUNITY): Payer: Medicare HMO

## 2019-07-03 ENCOUNTER — Emergency Department (HOSPITAL_COMMUNITY)
Admission: EM | Admit: 2019-07-03 | Discharge: 2019-07-03 | Disposition: A | Discharge status: Home / Self Care | Payer: Medicare HMO | Attending: Emergency Medicine | Admitting: Emergency Medicine

## 2019-07-03 ENCOUNTER — Other Ambulatory Visit: Payer: Self-pay

## 2019-07-03 DIAGNOSIS — F79 Unspecified intellectual disabilities: Secondary | ICD-10-CM | POA: Insufficient documentation

## 2019-07-03 DIAGNOSIS — E1122 Type 2 diabetes mellitus with diabetic chronic kidney disease: Secondary | ICD-10-CM | POA: Insufficient documentation

## 2019-07-03 DIAGNOSIS — M25562 Pain in left knee: Secondary | ICD-10-CM | POA: Insufficient documentation

## 2019-07-03 DIAGNOSIS — N183 Chronic kidney disease, stage 3 unspecified: Secondary | ICD-10-CM | POA: Diagnosis not present

## 2019-07-03 DIAGNOSIS — M25561 Pain in right knee: Secondary | ICD-10-CM | POA: Insufficient documentation

## 2019-07-03 DIAGNOSIS — M25569 Pain in unspecified knee: Secondary | ICD-10-CM

## 2019-07-03 DIAGNOSIS — I129 Hypertensive chronic kidney disease with stage 1 through stage 4 chronic kidney disease, or unspecified chronic kidney disease: Secondary | ICD-10-CM | POA: Insufficient documentation

## 2019-07-03 DIAGNOSIS — Z79899 Other long term (current) drug therapy: Secondary | ICD-10-CM | POA: Diagnosis not present

## 2019-07-03 DIAGNOSIS — Z794 Long term (current) use of insulin: Secondary | ICD-10-CM | POA: Diagnosis not present

## 2019-07-03 DIAGNOSIS — J449 Chronic obstructive pulmonary disease, unspecified: Secondary | ICD-10-CM | POA: Diagnosis not present

## 2019-07-03 MED ORDER — ACETAMINOPHEN 325 MG PO TABS
650.0000 mg | ORAL_TABLET | Freq: Once | ORAL | Status: AC
  Administered 2019-07-03: 650 mg via ORAL
  Filled 2019-07-03: qty 2

## 2019-07-03 NOTE — ED Notes (Signed)
Patient provided with urinal.

## 2019-07-03 NOTE — Discharge Instructions (Addendum)
You were seen in the emergency department for evaluation of difficulty walking and bilateral knee pain.  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  Please use the walker to help you ambulate.  Tylenol as needed for pain.  Follow-up with orthopedics and your primary care doctor.  Return to the emergency department if any worsening or concerning symptoms

## 2019-07-03 NOTE — ED Triage Notes (Signed)
Pt BIB EMS from [facility] in [place]. Pt c/o bilateral leg pain, sharp upon standing. Pt had unsteady gait with EMS.   120/60 HR 120

## 2019-07-03 NOTE — ED Notes (Signed)
[facility] called for transport.  

## 2019-07-03 NOTE — ED Notes (Signed)
Attempted to call social work per MD request with no answer. Social work consult placed.

## 2019-07-03 NOTE — Care Management (Addendum)
[facility] ED RNCM received call concerning patient needing a rolling walker in the [facility] ED.  CM sent secure chat message to [facility] ED CSW to assist since  walkers are kept in the [facility] CM office.

## 2019-07-03 NOTE — ED Notes (Signed)
Patient given sandwich and diet coke.  

## 2019-07-03 NOTE — Progress Notes (Addendum)
CSW received a message from the RN and RN CM covering [facility] ED requesting the retrieval of a rolling two-wheel, rear, walker.  RN CM stated provider placed the orders for a walker in the pt's chart.  CSW with the assistance of the [facility] retrieved one from the supply room on the [address] and at the direction of the [facility] who left a note stating what was taken, along with a facsheet for the pt, placed a MRN sticker on the note/facesheet to fully identify the pt.  CSW will providea copy of the facesheet of the pt on the [facility] ED RN CM's desk so that she Is aware of the transaction when the RN CM returns on 4/6.  RN provided with a walker and [facility] was called by the RN for transport.  Please reconsult if future social work needs arise.  CSW signing off, as social work intervention is no longer needed.  Alphonse Guild. Syd Newsome  MSW, LCSW, LCAS, CSI Transitions of Care Clinical Social Worker Care Coordination Department Ph: [phone]

## 2019-07-03 NOTE — ED Notes (Signed)
Patient given diet coke. 

## 2019-07-03 NOTE — ED Notes (Signed)
Patient transported to XR. 

## 2019-07-03 NOTE — ED Provider Notes (Signed)
[facility] DEPT Provider Note   CSN: [id number] Arrival date & time: 07/03/19  1851     History Chief Complaint  Patient presents with  . Ambulation issues    Mark Chang is a 74 y.o. male.  He has a history of COPD and CKD.  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  He is presenting by ambulance with complaint of knee pain bilateral that started a few days ago.  Level 5 caveat secondary to poor historian.  He denies any falls.  He says he needs a walker.  No fever.  He is asking for something to drink.  Denies any numbness or weakness  The history is provided by the patient and the EMS personnel.  Knee Pain Location:  Knee Injury: no   Knee location:  L knee and R knee Pain details:    Quality:  Sharp   Radiates to:  Does not radiate   Severity:  Moderate   Onset quality:  Unable to specify Chronicity:  New Relieved by:  None tried Worsened by:  Bearing weight and activity Ineffective treatments:  None tried Associated symptoms: no back pain, no fever, no muscle weakness, no numbness and no swelling   Risk factors: obesity        Past Medical History:  Diagnosis Date  . CKD (chronic kidney disease), stage III ([place]) 03/24/2017  . COPD (chronic obstructive pulmonary disease) ([place])   . DM (diabetes mellitus), type 2 with renal complications ([place]) 123XX123  . Hyperlipidemia   . Hypertension   . Mental retardation   . Obesity, Class III, BMI 40-49.9 (morbid obesity) ([place]) 03/24/2017  . Pernicious anemia     Patient Active Problem List   Diagnosis Date Noted  . Sepsis due to Escherichia coli (E. coli) ([place]) 03/24/2017  . E. coli UTI 03/24/2017  . Obesity, Class III, BMI 40-49.9 (morbid obesity) ([place]) 03/24/2017  . DM (diabetes mellitus), type 2 with renal complications ([place]) 123XX123  . CKD (chronic kidney disease), stage III 03/24/2017  . Mental retardation 03/20/2017  . Hypertension 03/20/2017  . AKI (acute  kidney injury) ([place]) 03/20/2017  . Pernicious anemia 03/20/2017    No past surgical history on file.     No family history on file.  Social History   Tobacco Use  . Smoking status: Never Smoker  . Smokeless tobacco: Never Used  Substance Use Topics  . Alcohol use: No  . Drug use: Not on file    Home Medications Prior to Admission medications   Medication Sig Start Date End Date Taking? Authorizing Provider  acetaminophen (TYLENOL) 500 MG tablet Take 500 mg by mouth 3 (three) times daily.     [provider]  Artificial Tear Solution (TEARS AGAIN) SOLN Place 1 drop into both eyes 2 (two) times daily as needed (dry eyes).    [provider]  atorvastatin (LIPITOR) 20 MG tablet Take 20 mg by mouth at bedtime.     [provider]  docusate sodium (COLACE) 100 MG capsule Take 100 mg by mouth 2 (two) times daily.    [provider]  Docusate Sodium (DIOCTO) 150 MG/15ML syrup 10 mg See admin instructions. Instil 1 ml (10 mg) into each ear and wait 10 minutes, then irrigate with warm water - repeat every month    [provider]  escitalopram (LEXAPRO) 10 MG tablet Take 10 mg by mouth daily.    [provider]  furosemide (LASIX) 40  MG tablet Take 40 mg by mouth daily.    [provider]  glipiZIDE (GLUCOTROL XL) 5 MG 24 hr tablet Take 5 mg by mouth daily.    [provider]  insulin aspart (NOVOLOG) 100 UNIT/ML injection CBG 70 - 120: 0 units CBG 121 - 150: 2 units CBG 151 - 200: 3 units CBG 201 - 250: 5 units CBG 251 - 300: 8 units CBG 301 - 350: 11 units CBG 351 - 400: 15 units Patient taking differently: Inject 3 Units into the skin See admin instructions. Inject 3 units subcutaneously three times daily; Hold if BS <100 or if not eating; inject 5 units three times daily as needed if BS >450/ recheck in 1 hour 07/23/16   Hosie Poisson, MD  Insulin Detemir (LEVEMIR FLEXTOUCH) 100 UNIT/ML Pen Inject 30-40 Units  into the skin See admin instructions. Inject 40 units subcutaneously every morning and 30 units at bedtime    [provider]  linagliptin (TRADJENTA) 5 MG TABS tablet Take 5 mg by mouth daily.    [provider]  lisinopril (PRINIVIL,ZESTRIL) 40 MG tablet Take 40 mg by mouth daily.     [provider]  Melatonin 5 MG CAPS Take 10 mg by mouth at bedtime.     [provider]  omega-3 acid ethyl esters (LOVAZA) 1 g capsule Take 1 g by mouth 2 (two) times daily.    [provider]  potassium chloride SA (K-DUR,KLOR-CON) 20 MEQ tablet Take 20 mEq by mouth daily.    [provider]  ranitidine (ZANTAC) 150 MG tablet Take 150 mg by mouth 2 (two) times daily.    [provider]  vitamin B-12 (CYANOCOBALAMIN) 1000 MCG tablet Take 1,000 mcg by mouth daily.    [provider]    Allergies    Patient has no known allergies.  Review of Systems   Review of Systems  Constitutional: Negative for fever.  HENT: Negative for sore throat.   Eyes: Negative for visual disturbance.  Respiratory: Negative for shortness of breath.   Cardiovascular: Negative for chest pain.  Gastrointestinal: Negative for abdominal pain.  Genitourinary: Negative for dysuria.  Musculoskeletal: Negative for back pain.  Skin: Negative for rash.  Neurological: Negative for headaches.    Physical Exam Updated Vital Signs BP (!) 169/82 (BP Location: Right Arm)   Pulse 89   Temp 97.8 F (36.6 C) (Oral)   Resp 16   SpO2 100%   Physical Exam Vitals and nursing note reviewed.  Constitutional:      Appearance: He is well-developed.  HENT:     Head: Normocephalic and atraumatic.  Eyes:     Conjunctiva/sclera: Conjunctivae normal.  Cardiovascular:     Rate and Rhythm: Normal rate and regular rhythm.     Heart sounds: No murmur.  Pulmonary:     Effort: Pulmonary effort is normal. No respiratory distress.     Breath sounds: Normal breath sounds.    Abdominal:     Palpations: Abdomen is soft.     Tenderness: There is no abdominal tenderness.  Musculoskeletal:        General: Tenderness present. No swelling or deformity. Normal range of motion.     Cervical back: Neck supple.     Right lower leg: No edema.     Left lower leg: No edema.     Comments: He has some mild tenderness of both knees.  There is no redness swelling or warmth.  Calves have no  cords or edema.  Distal pulses and sensation intact.  He was able to raise his legs off the bed without any difficulty demonstrating normal extensor mechanism.  No ligamentous instability.  Skin:    General: Skin is warm and dry.     Capillary Refill: Capillary refill takes less than 2 seconds.  Neurological:     General: No focal deficit present.     Mental Status: He is alert. Mental status is at baseline.     Sensory: No sensory deficit.     Motor: No weakness.     ED Results / Procedures / Treatments   Labs (all labs ordered are listed, but only abnormal results are displayed) Labs Reviewed - No data to display  EKG None  Radiology DG Knee Complete 4 Views Left  Result Date: 07/03/2019 CLINICAL DATA:  BILATERAL leg pain sharp upon standing, unsteady gait EXAM: LEFT KNEE - COMPLETE 4+ VIEW COMPARISON:  None FINDINGS: Osseous demineralization. Tricompartmental osteoarthritic changes with joint space narrowing and spur formation greatest at medial compartment. No acute fracture, dislocation, or bone destruction. Small knee joint effusion. Scattered atherosclerotic calcifications. 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 Electronically Signed   By: Lavonia Dana M.D.   On: 07/03/2019 19:48   DG Knee Complete 4 Views Right  Result Date: 07/03/2019 CLINICAL DATA:  Pain without trauma. EXAM: RIGHT KNEE - COMPLETE 4+ VIEW COMPARISON:  None. FINDINGS: Moderate patellofemoral and medial  compartment joint space narrowing with osteophyte formation. No joint effusion. No acute fracture or dislocation. IMPRESSION: Osteoarthritis, without acute osseous finding. Electronically Signed   By: Abigail Miyamoto M.D.   On: 07/03/2019 19:50    Procedures Procedures (including critical care time)  Medications Ordered in ED Medications  acetaminophen (TYLENOL) tablet 650 mg (650 mg Oral Given 07/03/19 2144)    ED Course  I have reviewed the triage vital signs and the nursing notes.  Pertinent labs & imaging results that were available during my care of the patient were reviewed by me and considered in my medical decision making (see chart for details).  Clinical Course as of Jul 03 1028  Mon Jul 03, 2019  2117 Differential includes fracture, ligamentous, or arthritis.  No evidence of any particular swelling or redness so doubt septic joint or gout.   [MB]  2224 Ordered bilateral knee xrays - interpreted by me showing osteoarthritis left greater than right.  We talked to [facility] and have gotten him a walker.  We will have him return to his facility by [facility] and have him follow-up with orthopedist.   [MB]  2230 Attempted to reach guardian which is his brother.  The phone rang a few times and then automatically disconnected.   [MB]    Clinical Course User Index [MB] Hayden Rasmussen, MD   MDM Rules/Calculators/A&P                       Final Clinical Impression(s) / ED Diagnoses Final diagnoses:  Knee pain    Rx / DC Orders ED Discharge Orders    None       Hayden Rasmussen, MD 07/04/19 1030

## 2020-10-19 ENCOUNTER — Inpatient Hospital Stay (HOSPITAL_COMMUNITY)
Admission: EM | Admit: 2020-10-19 | Discharge: 2020-11-06 | DRG: 853 | Disposition: A | Discharge status: Skilled Nursing Facility | Payer: Medicare HMO | Attending: Internal Medicine | Admitting: Internal Medicine

## 2020-10-19 ENCOUNTER — Other Ambulatory Visit: Payer: Self-pay

## 2020-10-19 ENCOUNTER — Emergency Department (HOSPITAL_COMMUNITY): Payer: Medicare HMO

## 2020-10-19 DIAGNOSIS — R6250 Unspecified lack of expected normal physiological development in childhood: Secondary | ICD-10-CM | POA: Diagnosis present

## 2020-10-19 DIAGNOSIS — E878 Other disorders of electrolyte and fluid balance, not elsewhere classified: Secondary | ICD-10-CM | POA: Diagnosis present

## 2020-10-19 DIAGNOSIS — Z79899 Other long term (current) drug therapy: Secondary | ICD-10-CM

## 2020-10-19 DIAGNOSIS — N1831 Chronic kidney disease, stage 3a: Secondary | ICD-10-CM | POA: Diagnosis present

## 2020-10-19 DIAGNOSIS — Z794 Long term (current) use of insulin: Secondary | ICD-10-CM

## 2020-10-19 DIAGNOSIS — C7A020 Malignant carcinoid tumor of the appendix: Secondary | ICD-10-CM | POA: Diagnosis present

## 2020-10-19 DIAGNOSIS — J449 Chronic obstructive pulmonary disease, unspecified: Secondary | ICD-10-CM | POA: Diagnosis present

## 2020-10-19 DIAGNOSIS — K56609 Unspecified intestinal obstruction, unspecified as to partial versus complete obstruction: Secondary | ICD-10-CM | POA: Diagnosis present

## 2020-10-19 DIAGNOSIS — R198 Other specified symptoms and signs involving the digestive system and abdomen: Secondary | ICD-10-CM | POA: Diagnosis present

## 2020-10-19 DIAGNOSIS — K658 Other peritonitis: Secondary | ICD-10-CM | POA: Diagnosis present

## 2020-10-19 DIAGNOSIS — K631 Perforation of intestine (nontraumatic): Secondary | ICD-10-CM | POA: Diagnosis present

## 2020-10-19 DIAGNOSIS — K6389 Other specified diseases of intestine: Secondary | ICD-10-CM | POA: Diagnosis present

## 2020-10-19 DIAGNOSIS — R6521 Severe sepsis with septic shock: Secondary | ICD-10-CM | POA: Diagnosis present

## 2020-10-19 DIAGNOSIS — I429 Cardiomyopathy, unspecified: Secondary | ICD-10-CM | POA: Diagnosis present

## 2020-10-19 DIAGNOSIS — E785 Hyperlipidemia, unspecified: Secondary | ICD-10-CM | POA: Diagnosis present

## 2020-10-19 DIAGNOSIS — I472 Ventricular tachycardia: Secondary | ICD-10-CM | POA: Diagnosis not present

## 2020-10-19 DIAGNOSIS — N179 Acute kidney failure, unspecified: Secondary | ICD-10-CM | POA: Diagnosis present

## 2020-10-19 DIAGNOSIS — R571 Hypovolemic shock: Secondary | ICD-10-CM | POA: Diagnosis present

## 2020-10-19 DIAGNOSIS — I493 Ventricular premature depolarization: Secondary | ICD-10-CM | POA: Diagnosis present

## 2020-10-19 DIAGNOSIS — E43 Unspecified severe protein-calorie malnutrition: Secondary | ICD-10-CM | POA: Diagnosis present

## 2020-10-19 DIAGNOSIS — I252 Old myocardial infarction: Secondary | ICD-10-CM

## 2020-10-19 DIAGNOSIS — I130 Hypertensive heart and chronic kidney disease with heart failure and stage 1 through stage 4 chronic kidney disease, or unspecified chronic kidney disease: Secondary | ICD-10-CM | POA: Diagnosis present

## 2020-10-19 DIAGNOSIS — Z4659 Encounter for fitting and adjustment of other gastrointestinal appliance and device: Secondary | ICD-10-CM

## 2020-10-19 DIAGNOSIS — R197 Diarrhea, unspecified: Secondary | ICD-10-CM

## 2020-10-19 DIAGNOSIS — Z20822 Contact with and (suspected) exposure to covid-19: Secondary | ICD-10-CM | POA: Diagnosis present

## 2020-10-19 DIAGNOSIS — A419 Sepsis, unspecified organism: Secondary | ICD-10-CM | POA: Diagnosis not present

## 2020-10-19 DIAGNOSIS — E872 Acidosis: Secondary | ICD-10-CM | POA: Diagnosis not present

## 2020-10-19 DIAGNOSIS — K529 Noninfective gastroenteritis and colitis, unspecified: Secondary | ICD-10-CM

## 2020-10-19 DIAGNOSIS — D72829 Elevated white blood cell count, unspecified: Secondary | ICD-10-CM

## 2020-10-19 DIAGNOSIS — E1129 Type 2 diabetes mellitus with other diabetic kidney complication: Secondary | ICD-10-CM | POA: Diagnosis present

## 2020-10-19 DIAGNOSIS — E871 Hypo-osmolality and hyponatremia: Secondary | ICD-10-CM | POA: Diagnosis present

## 2020-10-19 DIAGNOSIS — R109 Unspecified abdominal pain: Secondary | ICD-10-CM | POA: Diagnosis not present

## 2020-10-19 DIAGNOSIS — E870 Hyperosmolality and hypernatremia: Secondary | ICD-10-CM | POA: Diagnosis present

## 2020-10-19 DIAGNOSIS — I480 Paroxysmal atrial fibrillation: Secondary | ICD-10-CM | POA: Diagnosis present

## 2020-10-19 DIAGNOSIS — I5042 Chronic combined systolic (congestive) and diastolic (congestive) heart failure: Secondary | ICD-10-CM | POA: Diagnosis present

## 2020-10-19 DIAGNOSIS — Z6841 Body Mass Index (BMI) 40.0 and over, adult: Secondary | ICD-10-CM

## 2020-10-19 DIAGNOSIS — I9581 Postprocedural hypotension: Secondary | ICD-10-CM | POA: Diagnosis not present

## 2020-10-19 DIAGNOSIS — C772 Secondary and unspecified malignant neoplasm of intra-abdominal lymph nodes: Secondary | ICD-10-CM | POA: Diagnosis present

## 2020-10-19 DIAGNOSIS — R4781 Slurred speech: Secondary | ICD-10-CM | POA: Diagnosis not present

## 2020-10-19 DIAGNOSIS — J9600 Acute respiratory failure, unspecified whether with hypoxia or hypercapnia: Secondary | ICD-10-CM

## 2020-10-19 DIAGNOSIS — F79 Unspecified intellectual disabilities: Secondary | ICD-10-CM | POA: Diagnosis present

## 2020-10-19 DIAGNOSIS — E876 Hypokalemia: Secondary | ICD-10-CM | POA: Diagnosis present

## 2020-10-19 DIAGNOSIS — Z9289 Personal history of other medical treatment: Secondary | ICD-10-CM

## 2020-10-19 DIAGNOSIS — J95821 Acute postprocedural respiratory failure: Secondary | ICD-10-CM | POA: Diagnosis not present

## 2020-10-19 DIAGNOSIS — C187 Malignant neoplasm of sigmoid colon: Secondary | ICD-10-CM | POA: Diagnosis present

## 2020-10-19 DIAGNOSIS — E1122 Type 2 diabetes mellitus with diabetic chronic kidney disease: Secondary | ICD-10-CM | POA: Diagnosis present

## 2020-10-19 DIAGNOSIS — J9601 Acute respiratory failure with hypoxia: Secondary | ICD-10-CM | POA: Insufficient documentation

## 2020-10-19 DIAGNOSIS — N183 Chronic kidney disease, stage 3 unspecified: Secondary | ICD-10-CM | POA: Diagnosis present

## 2020-10-19 LAB — LACTIC ACID, PLASMA
Lactic Acid, Venous: 2.2 mmol/L (ref 0.5–1.9)
Lactic Acid, Venous: 1.5 mmol/L (ref 0.5–1.9)

## 2020-10-19 LAB — RESP PANEL BY RT-PCR (FLU A&B, COVID) ARPGX2
Influenza A by PCR: NEGATIVE
Influenza B by PCR: NEGATIVE
SARS Coronavirus 2 by RT PCR: NEGATIVE

## 2020-10-19 LAB — APTT: aPTT: 29 seconds (ref 24–36)

## 2020-10-19 LAB — PROTIME-INR
INR: 1.3 — ABNORMAL HIGH (ref 0.8–1.2)
Prothrombin Time: 16.2 seconds — ABNORMAL HIGH (ref 11.4–15.2)

## 2020-10-19 LAB — COMPREHENSIVE METABOLIC PANEL
ALT: 26 U/L (ref 0–44)
AST: 33 U/L (ref 15–41)
Albumin: 2.8 g/dL — ABNORMAL LOW (ref 3.5–5.0)
Alkaline Phosphatase: 50 U/L (ref 38–126)
Anion gap: 16 — ABNORMAL HIGH (ref 5–15)
BUN: 127 mg/dL — ABNORMAL HIGH (ref 8–23)
CO2: 16 mmol/L — ABNORMAL LOW (ref 22–32)
Calcium: 8.3 mg/dL — ABNORMAL LOW (ref 8.9–10.3)
Chloride: 102 mmol/L (ref 98–111)
Creatinine, Ser: 3.92 mg/dL — ABNORMAL HIGH (ref 0.61–1.24)
GFR, Estimated: 15 mL/min — ABNORMAL LOW (ref >60)
Glucose, Bld: 182 mg/dL — ABNORMAL HIGH (ref 70–99)
Potassium: 4.3 mmol/L (ref 3.5–5.1)
Sodium: 134 mmol/L — ABNORMAL LOW (ref 135–145)
Total Bilirubin: 0.6 mg/dL (ref 0.3–1.2)
Total Protein: 6.9 g/dL (ref 6.5–8.1)

## 2020-10-19 LAB — URINALYSIS, ROUTINE W REFLEX MICROSCOPIC
Bilirubin Urine: NEGATIVE
Glucose, UA: NEGATIVE mg/dL
Hgb urine dipstick: NEGATIVE
Ketones, ur: NEGATIVE mg/dL
Leukocytes,Ua: NEGATIVE
Nitrite: NEGATIVE
Protein, ur: NEGATIVE mg/dL
Specific Gravity, Urine: 1.015 (ref 1.005–1.030)
pH: 5 (ref 5.0–8.0)

## 2020-10-19 LAB — CBC WITH DIFFERENTIAL/PLATELET
Abs Immature Granulocytes: 0.67 10*3/uL — ABNORMAL HIGH (ref 0.00–0.07)
Basophils Absolute: 0.1 10*3/uL (ref 0.0–0.1)
Basophils Relative: 0 %
Eosinophils Absolute: 0 10*3/uL (ref 0.0–0.5)
Eosinophils Relative: 0 %
HCT: 36 % — ABNORMAL LOW (ref 39.0–52.0)
Hemoglobin: 11.5 g/dL — ABNORMAL LOW (ref 13.0–17.0)
Immature Granulocytes: 3 %
Lymphocytes Relative: 5 %
Lymphs Abs: 1.2 10*3/uL (ref 0.7–4.0)
MCH: 28.7 pg (ref 26.0–34.0)
MCHC: 31.9 g/dL (ref 30.0–36.0)
MCV: 89.8 fL (ref 80.0–100.0)
Monocytes Absolute: 2.2 10*3/uL — ABNORMAL HIGH (ref 0.1–1.0)
Monocytes Relative: 10 %
Neutro Abs: 18.6 10*3/uL — ABNORMAL HIGH (ref 1.7–7.7)
Neutrophils Relative %: 82 %
Platelets: 384 10*3/uL (ref 150–400)
RBC: 4.01 MIL/uL — ABNORMAL LOW (ref 4.22–5.81)
RDW: 14.1 % (ref 11.5–15.5)
WBC: 22.7 10*3/uL — ABNORMAL HIGH (ref 4.0–10.5)
nRBC: 0 % (ref 0.0–0.2)

## 2020-10-19 MED ORDER — METRONIDAZOLE 500 MG/100ML IV SOLN
500.0000 mg | Freq: Once | INTRAVENOUS | Status: AC
  Administered 2020-10-19: 500 mg via INTRAVENOUS
  Filled 2020-10-19: qty 100

## 2020-10-19 MED ORDER — LACTATED RINGERS IV BOLUS (SEPSIS)
1000.0000 mL | Freq: Once | INTRAVENOUS | Status: AC
  Administered 2020-10-19: 1000 mL via INTRAVENOUS

## 2020-10-19 MED ORDER — LACTATED RINGERS IV BOLUS (SEPSIS)
200.0000 mL | Freq: Once | INTRAVENOUS | Status: AC
  Administered 2020-10-19: 200 mL via INTRAVENOUS

## 2020-10-19 MED ORDER — LACTATED RINGERS IV SOLN: INTRAVENOUS | Status: DC

## 2020-10-19 MED ORDER — SODIUM CHLORIDE 0.9 % IV SOLN
2.0000 g | Freq: Once | INTRAVENOUS | Status: AC
  Administered 2020-10-19: 2 g via INTRAVENOUS
  Filled 2020-10-19: qty 2

## 2020-10-19 NOTE — ED Triage Notes (Signed)
[facility] from [facility], pt complaining of abdominal pain X2 weeks, diarrhea x a few days. Hypotensive at 68/40 with EMS. Given 1L fluid brought BP up to 86/50. HX DM, HTN.

## 2020-10-19 NOTE — Progress Notes (Signed)
Elink following for Sepsis Protocol 

## 2020-10-19 NOTE — ED Triage Notes (Signed)
Alert but disoriented to person and place

## 2020-10-19 NOTE — Progress Notes (Signed)
A consult was received from an ED physician for cefepime per pharmacy dosing.  The patient's profile has been reviewed for ht/wt/allergies/indication/available labs.     A one time order has been placed for cefepime 2 gm.  Further antibiotics/pharmacy consults should be ordered by admitting physician if indicated.                       Thank you,  Eudelia Bunch, Pharm.D 10/19/2020 8:35 PM

## 2020-10-19 NOTE — ED Provider Notes (Signed)
[facility] DEPT Provider Note   CSN: [id number] Arrival date & time: 10/19/20  1954     History No chief complaint on file.   Mark Chang is a 75 y.o. male.  HPI  Patient is a resident of a nursing facility.  According to the medical records he has history of CKD, COPD, diabetes is well as MR. Patient presented with complaints of abdominal pain diarrhea as well as low blood pressure.  According to the nursing home report the patient has been having some abdominal discomfort for couple of weeks.  Is also had a few episodes of diarrhea.  Today they noted he was hypotensive so EMS was contacted.  EMS noted blood pressures of 68/40 and then 86/50.  He was given IV fluids.  Patient was noted to have a low-grade temperature.  Patient answers questions but is not really able to give me any clear history of his been having vomiting or diarrhea.  Right now he states he feels great.  Past Medical History:  Diagnosis Date   CKD (chronic kidney disease), stage III ([place]) 03/24/2017   COPD (chronic obstructive pulmonary disease) (HCC)    DM (diabetes mellitus), type 2 with renal complications ([place]) 123XX123   Hyperlipidemia    Hypertension    Mental retardation    Obesity, Class III, BMI 40-49.9 (morbid obesity) ([place]) 03/24/2017   Pernicious anemia     Patient Active Problem List   Diagnosis Date Noted   Sepsis due to Escherichia coli (E. coli) ([place]) 03/24/2017   E. coli UTI 03/24/2017   Obesity, Class III, BMI 40-49.9 (morbid obesity) ([place]) 03/24/2017   DM (diabetes mellitus), type 2 with renal complications ([place]) 123XX123   CKD (chronic kidney disease), stage III ([place]) 03/24/2017   Mental retardation 03/20/2017   Hypertension 03/20/2017   AKI (acute kidney injury) ([place]) 03/20/2017   Pernicious anemia 03/20/2017    No past surgical history on file.     No family history on file.  Social History   Tobacco Use   Smoking status: Never    Smokeless tobacco: Never  Vaping Use   Vaping Use: Never used  Substance Use Topics   Alcohol use: No    Home Medications Prior to Admission medications   Medication Sig Start Date End Date Taking? Authorizing Provider  acetaminophen (TYLENOL) 500 MG tablet Take 500 mg by mouth 3 (three) times daily.     [provider]  Artificial Tear Solution (TEARS AGAIN) SOLN Place 1 drop into both eyes 2 (two) times daily as needed (dry eyes).    [provider]  atorvastatin (LIPITOR) 40 MG tablet Take 40 mg by mouth at bedtime. 07/03/19   [provider]  docusate sodium (COLACE) 100 MG capsule Take 100 mg by mouth 2 (two) times daily.    [provider]  Docusate Sodium (DIOCTO) 150 MG/15ML syrup 10 mg See admin instructions. Instil 1 ml (10 mg) into each ear and wait 10 minutes, then irrigate with warm water - repeat every month    [provider]  doxycycline (VIBRA-TABS) 100 MG tablet Take 100 mg by mouth 2 (two) times daily. 06/26/19   [provider]  escitalopram (LEXAPRO) 10 MG tablet Take 10 mg by mouth daily.    [provider]  famotidine (PEPCID) 20 MG tablet Take 20 mg by mouth 2 (two) times daily. 06/12/19   [provider]  furosemide (LASIX) 40 MG tablet Take 40 mg by mouth daily.  [provider]  gabapentin (NEURONTIN) 100 MG capsule Take 100 mg by mouth 2 (two) times daily.  07/03/19   [provider]  GLIPIZIDE XL 10 MG 24 hr tablet Take 10 mg by mouth daily. 07/03/19   [provider]  insulin aspart (NOVOLOG) 100 UNIT/ML injection CBG 70 - 120: 0 units CBG 121 - 150: 2 units CBG 151 - 200: 3 units CBG 201 - 250: 5 units CBG 251 - 300: 8 units CBG 301 - 350: 11 units CBG 351 - 400: 15 units Patient not taking: Reported on 07/03/2019 07/23/16   Hosie Poisson, MD  Insulin Aspart FlexPen 100 UNIT/ML SOPN Inject 8 Units into the skin in the morning, at noon, and at bedtime.  06/16/19    [provider]  Insulin Detemir (LEVEMIR FLEXTOUCH) 100 UNIT/ML Pen Inject 20-60 Units into the skin 2 (two) times daily. Inject 60 units subcutaneously every morning and 20 units at bedtime    [provider]  lisinopril (PRINIVIL,ZESTRIL) 40 MG tablet Take 40 mg by mouth daily.     [provider]  Melatonin 5 MG CAPS Take 10 mg by mouth at bedtime.     [provider]  omega-3 acid ethyl esters (LOVAZA) 1 g capsule Take 1 g by mouth 2 (two) times daily.    [provider]  potassium chloride SA (K-DUR,KLOR-CON) 20 MEQ tablet Take 20 mEq by mouth daily.    [provider]  TRULICITY 1.5 0000000 SOPN Inject 1.5 mg into the skin once a week.  06/13/19   [provider]  vitamin B-12 (CYANOCOBALAMIN) 1000 MCG tablet Take 1,000 mcg by mouth daily.    [provider]    Allergies    Patient has no known allergies.  Review of Systems   Review of Systems  All other systems reviewed and are negative.  Physical Exam Updated Vital Signs BP (!) 119/57   Pulse 99   Temp 99 F (37.2 C) (Oral)   Resp (!) 21   Ht 1.727 m ('5\' 8"'$ )   Wt 122 kg   SpO2 98%   BMI 40.90 kg/m   Physical Exam Vitals and nursing note reviewed.  Constitutional:      General: He is not in acute distress.    Appearance: He is well-developed.  HENT:     Head: Normocephalic and atraumatic.     Right Ear: External ear normal.     Left Ear: External ear normal.     Mouth/Throat:     Pharynx: No oropharyngeal exudate.  Eyes:     General: No scleral icterus.       Right eye: No discharge.        Left eye: No discharge.     Conjunctiva/sclera: Conjunctivae normal.  Neck:     Trachea: No tracheal deviation.  Cardiovascular:     Rate and Rhythm: Normal rate and regular rhythm.  Pulmonary:     Effort: Pulmonary effort is normal. No respiratory distress.     Breath sounds: Normal breath sounds. No stridor. No wheezing or rales.  Abdominal:      General: Abdomen is protuberant. Bowel sounds are normal.     Palpations: Abdomen is soft.     Tenderness: There is no abdominal tenderness. There is no guarding or rebound.     Hernia: No hernia is present.     Comments: Abdomen protuberant appears distended  Musculoskeletal:        General: No tenderness or  deformity.     Cervical back: Normal range of motion and neck supple.  Skin:    General: Skin is warm and dry.     Findings: No rash.  Neurological:     General: No focal deficit present.     Mental Status: He is alert.     Cranial Nerves: No cranial nerve deficit (no facial droop, extraocular movements intact, no slurred speech).     Sensory: No sensory deficit.     Motor: No abnormal muscle tone or seizure activity.     Coordination: Coordination normal.  Psychiatric:        Mood and Affect: Mood normal.    ED Results / Procedures / Treatments   Labs (all labs ordered are listed, but only abnormal results are displayed) Labs Reviewed  LACTIC ACID, PLASMA - Abnormal; Notable for the following components:      Result Value   Lactic Acid, Venous 2.2 (*)    All other components within normal limits  COMPREHENSIVE METABOLIC PANEL - Abnormal; Notable for the following components:   Sodium 134 (*)    CO2 16 (*)    Glucose, Bld 182 (*)    BUN 127 (*)    Creatinine, Ser 3.92 (*)    Calcium 8.3 (*)    Albumin 2.8 (*)    GFR, Estimated 15 (*)    Anion gap 16 (*)    All other components within normal limits  CBC WITH DIFFERENTIAL/PLATELET - Abnormal; Notable for the following components:   WBC 22.7 (*)    RBC 4.01 (*)    Hemoglobin 11.5 (*)    HCT 36.0 (*)    Neutro Abs 18.6 (*)    Monocytes Absolute 2.2 (*)    Abs Immature Granulocytes 0.67 (*)    All other components within normal limits  PROTIME-INR - Abnormal; Notable for the following components:   Prothrombin Time 16.2 (*)    INR 1.3 (*)    All other components within normal limits  URINALYSIS, ROUTINE W REFLEX  MICROSCOPIC - Abnormal; Notable for the following components:   APPearance HAZY (*)    All other components within normal limits  RESP PANEL BY RT-PCR (FLU A&B, COVID) ARPGX2  CULTURE, BLOOD (ROUTINE X 2)  CULTURE, BLOOD (ROUTINE X 2)  URINE CULTURE  LACTIC ACID, PLASMA  APTT    EKG EKG Interpretation  Date/Time:  Saturday October 19 2020 20:34:09 EDT Ventricular Rate:  103 PR Interval:  168 QRS Duration: 86 QT Interval:  390 QTC Calculation: 511 R Axis:   -36 Text Interpretation: Sinus tachycardia Inferior infarct, old Anteroseptal infarct, old Prolonged QT interval Baseline wander in lead(s) V4 No significant change since last tracing Confirmed by Dorie Rank [phone]) on 10/19/2020 8:58:05 PM  Radiology CT ABDOMEN PELVIS WO CONTRAST  Result Date: 10/19/2020 CLINICAL DATA:  Abdominal pain for 2 weeks. Diarrhea for a few days. EXAM: CT ABDOMEN AND PELVIS WITHOUT CONTRAST TECHNIQUE: Multidetector CT imaging of the abdomen and pelvis was performed following the standard protocol without IV contrast. COMPARISON:  None. FINDINGS: Lower chest: Lung bases show bronchial wall thickening and minor subsegmental atelectasis. Heart borderline enlarged. Three-vessel coronary artery calcifications. Hepatobiliary: Liver diffusely low in attenuation. There is central volume loss with relative enlargement of the caudate lobe and lateral segment of the left lobe. No liver mass. Gallbladder is collapsed around multiple gallstones. No evidence of acute cholecystitis. No bile duct dilation. Pancreas: Unremarkable. No pancreatic ductal dilatation or surrounding inflammatory changes. Spleen: Normal in  size without focal abnormality. Adrenals/Urinary Tract: 9 mm right adrenal angiomyolipoma. Adrenal glands otherwise unremarkable. Bilateral renal cortical thinning and bilateral low-attenuation renal masses consistent with cysts. No stones. No hydronephrosis. Normal ureters. Bladder decompressed with a Foley catheter  Stomach/Bowel: There is pneumatosis intestinalis involving the cecum and ascending colon. The colon is dilated, most prominently the cecum and ascending colon, maximum diameter 10 cm.: There is a circumferential area of wall thickening and colonic narrowing of the mid sigmoid colon concerning for a constricting mass lesion. This measures approximately 4.7 cm in length. No other areas of colonic wall thickening. There is mild hazy inflammatory change adjacent to the distended cecum and ascending colon. There are small bubbles of extraluminal air in the anterior mid to lower abdomen. Stomach is decompressed, otherwise unremarkable. Small bowel is unremarkable. Vascular/Lymphatic: Minor aortic atherosclerosis. No aneurysm. No enlarged lymph nodes. Reproductive: Unremarkable. Other: Trace amount of ascites tracking along the left pericolic gutter. Musculoskeletal: No fracture or acute finding.  No bone lesion. IMPRESSION: 1. 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 Colon is distended, most evident of the cecum and lower ascending colon, maximum 10 cm. 2. Apparent constricting mass lesion of the sigmoid colon, suspicious for carcinoma, which presumably is the cause of the colonic distension. Suspect the pneumatosis intestinalis is secondary. 3. Minimal amount of extraluminal air in the anterior mid to lower abdomen. 4. No other acute abnormality within the abdomen or pelvis. 5. Liver morphologic changes suggesting cirrhosis. No evidence of a liver mass. 6. Chronic findings include gallstones, mild aortic atherosclerosis, renal cortical thinning and renal cysts. Electronically Signed   By: Lajean Manes M.D.   On: 10/19/2020 23:25   DG Chest Port 1 View  Result Date: 10/19/2020 CLINICAL DATA:  Questionable sepsis - evaluate for abnormality EXAM: PORTABLE CHEST 1 VIEW COMPARISON:  03/20/2017 FINDINGS: The heart size and mediastinal contours are within normal  limits. Both lungs are clear. The visualized skeletal structures are unremarkable. IMPRESSION: No active disease. Electronically Signed   By: Ulyses Jarred M.D.   On: 10/19/2020 20:58    Procedures .Critical Care  Date/Time: 10/20/2020 12:07 AM Performed by: Dorie Rank, MD Authorized by: Dorie Rank, MD   Critical care provider statement:    Critical care time (minutes):  45   Critical care was time spent personally by me on the following activities:  Discussions with consultants, evaluation of patient's response to treatment, examination of patient, ordering and performing treatments and interventions, ordering and review of laboratory studies, ordering and review of radiographic studies, pulse oximetry, re-evaluation of patient's condition, obtaining history from patient or surrogate and review of old charts   Medications Ordered in ED Medications  lactated ringers infusion ( Intravenous New Bag/Given 10/19/20 2110)  lactated ringers bolus 1,000 mL (0 mLs Intravenous Stopped 10/19/20 2210)    And  lactated ringers bolus 1,000 mL ( Intravenous Restarted 10/19/20 2214)    And  lactated ringers bolus 200 mL (0 mLs Intravenous Stopped 10/19/20 2210)  ceFEPIme (MAXIPIME) 2 g in sodium chloride 0.9 % 100 mL IVPB (2 g Intravenous New Bag/Given 10/19/20 2233)  metroNIDAZOLE (FLAGYL) IVPB 500 mg (0 mg Intravenous Stopped 10/19/20 2210)    ED Course  I have reviewed the triage vital signs and the nursing notes.  Pertinent labs & imaging results that were available during my care of the patient were reviewed by me and considered in my medical decision making (see chart for details).  Clinical Course as  of 10/20/20 0008  Sat Oct 19, 2020  2118 BUN and creatinine elevated compared to previous [JK]  2339 Labs notable for AKI.  Leukocytosis and elevated lactic acid level noted.  CT scan shows colon distension, findings concerning for ischemia and possible mass.  Attempted to call family.  No answer at  this time [JK]    Clinical Course User Index [JK] Dorie Rank, MD   MDM Rules/Calculators/A&P                           Patient presented to the ED from a nursing facility for evaluation of low blood pressure fever questionable abdominal pain.  Patient's abdominal exam was distended.  Proceeded with laboratory testing and CT scan.  Patient's labs were notable for an AKI.  He also had findings concerning for sepsis.  Patient was started on broad-spectrum antibiotics and fluid resuscitation.  His blood pressure has improved.  CT scan does show signs of colonic distention with findings concerning for ischemia and possible mass.  I have consulted with general surgery Dr. Marlou Starks.  We will plan on admission to the medical service for stabilization.  Dr. Marlou Starks will come and evaluate patient regarding possible surgical intervention although right now patient does not seem to have the mental capacity to consent.  Tried to contact family members and so far have not been successful. Final Clinical Impression(s) / ED Diagnoses Final diagnoses:  Colitis  Intestinal obstruction, unspecified cause, unspecified whether partial or complete (HCC)  Pneumatosis intestinalis  AKI (acute kidney injury) ([place])  Sepsis, due to unspecified organism, unspecified whether acute organ dysfunction present [facility])     Dorie Rank, MD 10/20/20 0009

## 2020-10-20 ENCOUNTER — Encounter (HOSPITAL_COMMUNITY): Payer: Self-pay | Admitting: Internal Medicine

## 2020-10-20 DIAGNOSIS — E870 Hyperosmolality and hypernatremia: Secondary | ICD-10-CM | POA: Diagnosis present

## 2020-10-20 DIAGNOSIS — E1122 Type 2 diabetes mellitus with diabetic chronic kidney disease: Secondary | ICD-10-CM

## 2020-10-20 DIAGNOSIS — C187 Malignant neoplasm of sigmoid colon: Secondary | ICD-10-CM | POA: Diagnosis not present

## 2020-10-20 DIAGNOSIS — I5042 Chronic combined systolic (congestive) and diastolic (congestive) heart failure: Secondary | ICD-10-CM | POA: Diagnosis present

## 2020-10-20 DIAGNOSIS — I472 Ventricular tachycardia: Secondary | ICD-10-CM | POA: Diagnosis not present

## 2020-10-20 DIAGNOSIS — K6389 Other specified diseases of intestine: Secondary | ICD-10-CM | POA: Diagnosis not present

## 2020-10-20 DIAGNOSIS — K631 Perforation of intestine (nontraumatic): Secondary | ICD-10-CM | POA: Diagnosis not present

## 2020-10-20 DIAGNOSIS — R198 Other specified symptoms and signs involving the digestive system and abdomen: Secondary | ICD-10-CM | POA: Diagnosis present

## 2020-10-20 DIAGNOSIS — R571 Hypovolemic shock: Secondary | ICD-10-CM | POA: Diagnosis not present

## 2020-10-20 DIAGNOSIS — C7A020 Malignant carcinoid tumor of the appendix: Secondary | ICD-10-CM | POA: Diagnosis present

## 2020-10-20 DIAGNOSIS — E43 Unspecified severe protein-calorie malnutrition: Secondary | ICD-10-CM | POA: Diagnosis not present

## 2020-10-20 DIAGNOSIS — N179 Acute kidney failure, unspecified: Secondary | ICD-10-CM | POA: Diagnosis present

## 2020-10-20 DIAGNOSIS — R6521 Severe sepsis with septic shock: Secondary | ICD-10-CM | POA: Diagnosis not present

## 2020-10-20 DIAGNOSIS — I429 Cardiomyopathy, unspecified: Secondary | ICD-10-CM | POA: Diagnosis present

## 2020-10-20 DIAGNOSIS — A419 Sepsis, unspecified organism: Secondary | ICD-10-CM | POA: Diagnosis present

## 2020-10-20 DIAGNOSIS — N1831 Chronic kidney disease, stage 3a: Secondary | ICD-10-CM | POA: Diagnosis present

## 2020-10-20 DIAGNOSIS — C772 Secondary and unspecified malignant neoplasm of intra-abdominal lymph nodes: Secondary | ICD-10-CM | POA: Diagnosis not present

## 2020-10-20 DIAGNOSIS — R109 Unspecified abdominal pain: Secondary | ICD-10-CM | POA: Diagnosis present

## 2020-10-20 DIAGNOSIS — E872 Acidosis: Secondary | ICD-10-CM | POA: Diagnosis not present

## 2020-10-20 DIAGNOSIS — J449 Chronic obstructive pulmonary disease, unspecified: Secondary | ICD-10-CM | POA: Diagnosis present

## 2020-10-20 DIAGNOSIS — Z794 Long term (current) use of insulin: Secondary | ICD-10-CM

## 2020-10-20 DIAGNOSIS — N183 Chronic kidney disease, stage 3 unspecified: Secondary | ICD-10-CM

## 2020-10-20 DIAGNOSIS — I480 Paroxysmal atrial fibrillation: Secondary | ICD-10-CM | POA: Diagnosis not present

## 2020-10-20 DIAGNOSIS — K529 Noninfective gastroenteritis and colitis, unspecified: Secondary | ICD-10-CM | POA: Diagnosis not present

## 2020-10-20 DIAGNOSIS — Z20822 Contact with and (suspected) exposure to covid-19: Secondary | ICD-10-CM | POA: Diagnosis not present

## 2020-10-20 DIAGNOSIS — K56609 Unspecified intestinal obstruction, unspecified as to partial versus complete obstruction: Secondary | ICD-10-CM | POA: Diagnosis not present

## 2020-10-20 DIAGNOSIS — F79 Unspecified intellectual disabilities: Secondary | ICD-10-CM

## 2020-10-20 DIAGNOSIS — Z6841 Body Mass Index (BMI) 40.0 and over, adult: Secondary | ICD-10-CM | POA: Diagnosis not present

## 2020-10-20 DIAGNOSIS — I4891 Unspecified atrial fibrillation: Secondary | ICD-10-CM | POA: Diagnosis not present

## 2020-10-20 DIAGNOSIS — R197 Diarrhea, unspecified: Secondary | ICD-10-CM | POA: Diagnosis not present

## 2020-10-20 DIAGNOSIS — J95821 Acute postprocedural respiratory failure: Secondary | ICD-10-CM | POA: Diagnosis not present

## 2020-10-20 DIAGNOSIS — K658 Other peritonitis: Secondary | ICD-10-CM | POA: Diagnosis not present

## 2020-10-20 DIAGNOSIS — I130 Hypertensive heart and chronic kidney disease with heart failure and stage 1 through stage 4 chronic kidney disease, or unspecified chronic kidney disease: Secondary | ICD-10-CM | POA: Diagnosis present

## 2020-10-20 LAB — COMPREHENSIVE METABOLIC PANEL
ALT: 25 U/L (ref 0–44)
AST: 29 U/L (ref 15–41)
Albumin: 2.7 g/dL — ABNORMAL LOW (ref 3.5–5.0)
Alkaline Phosphatase: 49 U/L (ref 38–126)
Anion gap: 13 (ref 5–15)
BUN: 110 mg/dL — ABNORMAL HIGH (ref 8–23)
CO2: 15 mmol/L — ABNORMAL LOW (ref 22–32)
Calcium: 8.1 mg/dL — ABNORMAL LOW (ref 8.9–10.3)
Chloride: 106 mmol/L (ref 98–111)
Creatinine, Ser: 3.28 mg/dL — ABNORMAL HIGH (ref 0.61–1.24)
GFR, Estimated: 19 mL/min — ABNORMAL LOW (ref >60)
Glucose, Bld: 147 mg/dL — ABNORMAL HIGH (ref 70–99)
Potassium: 3.4 mmol/L — ABNORMAL LOW (ref 3.5–5.1)
Sodium: 134 mmol/L — ABNORMAL LOW (ref 135–145)
Total Bilirubin: 0.5 mg/dL (ref 0.3–1.2)
Total Protein: 6.2 g/dL — ABNORMAL LOW (ref 6.5–8.1)

## 2020-10-20 LAB — C DIFFICILE QUICK SCREEN W PCR REFLEX
C Diff antigen: NEGATIVE
C Diff interpretation: NOT DETECTED
C Diff toxin: NEGATIVE

## 2020-10-20 LAB — GLUCOSE, CAPILLARY
Glucose-Capillary: 101 mg/dL — ABNORMAL HIGH (ref 70–99)
Glucose-Capillary: 98 mg/dL (ref 70–99)
Glucose-Capillary: 100 mg/dL — ABNORMAL HIGH (ref 70–99)
Glucose-Capillary: 91 mg/dL (ref 70–99)

## 2020-10-20 LAB — CBC
HCT: 33.4 % — ABNORMAL LOW (ref 39.0–52.0)
Hemoglobin: 10.9 g/dL — ABNORMAL LOW (ref 13.0–17.0)
MCH: 28.8 pg (ref 26.0–34.0)
MCHC: 32.6 g/dL (ref 30.0–36.0)
MCV: 88.4 fL (ref 80.0–100.0)
Platelets: 344 10*3/uL (ref 150–400)
RBC: 3.78 MIL/uL — ABNORMAL LOW (ref 4.22–5.81)
RDW: 14.2 % (ref 11.5–15.5)
WBC: 20 10*3/uL — ABNORMAL HIGH (ref 4.0–10.5)
nRBC: 0 % (ref 0.0–0.2)

## 2020-10-20 LAB — LACTIC ACID, PLASMA
Lactic Acid, Venous: 1.3 mmol/L (ref 0.5–1.9)
Lactic Acid, Venous: 0.9 mmol/L (ref 0.5–1.9)

## 2020-10-20 LAB — PROTIME-INR
INR: 1.2 (ref 0.8–1.2)
Prothrombin Time: 15.2 seconds (ref 11.4–15.2)

## 2020-10-20 LAB — CBG MONITORING, ED
Glucose-Capillary: 145 mg/dL — ABNORMAL HIGH (ref 70–99)
Glucose-Capillary: 125 mg/dL — ABNORMAL HIGH (ref 70–99)

## 2020-10-20 LAB — CORTISOL-AM, BLOOD: Cortisol - AM: 61.3 ug/dL — ABNORMAL HIGH (ref 6.7–22.6)

## 2020-10-20 LAB — MRSA NEXT GEN BY PCR, NASAL: MRSA by PCR Next Gen: NOT DETECTED

## 2020-10-20 LAB — PROCALCITONIN: Procalcitonin: 6.58 ng/mL

## 2020-10-20 MED ORDER — ORAL CARE MOUTH RINSE
15.0000 mL | Freq: Two times a day (BID) | OROMUCOSAL | Status: DC
  Administered 2020-10-20 – 2020-10-24 (×7): 15 mL via OROMUCOSAL

## 2020-10-20 MED ORDER — ONDANSETRON HCL 4 MG PO TABS: 4.0000 mg | ORAL_TABLET | Freq: Four times a day (QID) | ORAL | Status: DC | PRN

## 2020-10-20 MED ORDER — POTASSIUM CHLORIDE 10 MEQ/100ML IV SOLN
10.0000 meq | INTRAVENOUS | Status: AC
  Administered 2020-10-20 (×2): 10 meq via INTRAVENOUS
  Filled 2020-10-20 (×2): qty 100

## 2020-10-20 MED ORDER — AMIODARONE HCL IN DEXTROSE 360-4.14 MG/200ML-% IV SOLN
60.0000 mg/h | INTRAVENOUS | Status: AC
  Administered 2020-10-20 (×2): 60 mg/h via INTRAVENOUS
  Filled 2020-10-20 (×2): qty 200

## 2020-10-20 MED ORDER — ACETAMINOPHEN 650 MG RE SUPP: 650.0000 mg | Freq: Four times a day (QID) | RECTAL | Status: DC | PRN

## 2020-10-20 MED ORDER — ONDANSETRON HCL 4 MG/2ML IJ SOLN
4.0000 mg | Freq: Four times a day (QID) | INTRAMUSCULAR | Status: DC | PRN
Start: 2020-10-20 — End: 2020-10-24

## 2020-10-20 MED ORDER — SODIUM CHLORIDE 0.9 % IV BOLUS
500.0000 mL | Freq: Once | INTRAVENOUS | Status: AC
  Administered 2020-10-20: 500 mL via INTRAVENOUS

## 2020-10-20 MED ORDER — ACETAMINOPHEN 325 MG PO TABS
650.0000 mg | ORAL_TABLET | Freq: Four times a day (QID) | ORAL | Status: DC | PRN
Start: 2020-10-20 — End: 2020-10-22

## 2020-10-20 MED ORDER — CHLORHEXIDINE GLUCONATE CLOTH 2 % EX PADS
6.0000 | MEDICATED_PAD | Freq: Every day | CUTANEOUS | Status: DC
  Administered 2020-10-20 – 2020-11-06 (×18): 6 via TOPICAL

## 2020-10-20 MED ORDER — HYDROMORPHONE HCL 1 MG/ML IJ SOLN
0.5000 mg | INTRAMUSCULAR | Status: DC | PRN
  Administered 2020-10-22 – 2020-10-23 (×5): 1 mg via INTRAVENOUS
  Filled 2020-10-20 (×6): qty 1

## 2020-10-20 MED ORDER — AMIODARONE IV BOLUS ONLY 150 MG/100ML
150.0000 mg | Freq: Once | INTRAVENOUS | Status: AC
  Administered 2020-10-20: 150 mg via INTRAVENOUS
  Filled 2020-10-20: qty 100

## 2020-10-20 MED ORDER — SODIUM CHLORIDE 0.9 % IV SOLN
2.0000 g | INTRAVENOUS | Status: DC
  Administered 2020-10-20: 2 g via INTRAVENOUS
  Filled 2020-10-20: qty 2

## 2020-10-20 MED ORDER — AMIODARONE LOAD VIA INFUSION
150.0000 mg | Freq: Once | INTRAVENOUS | Status: AC
  Administered 2020-10-20: 150 mg via INTRAVENOUS
  Filled 2020-10-20: qty 83.34

## 2020-10-20 MED ORDER — INSULIN DETEMIR 100 UNIT/ML ~~LOC~~ SOLN
15.0000 [IU] | Freq: Two times a day (BID) | SUBCUTANEOUS | Status: DC
  Administered 2020-10-20 – 2020-10-24 (×8): 15 [IU] via SUBCUTANEOUS
  Filled 2020-10-20 (×12): qty 0.15

## 2020-10-20 MED ORDER — METRONIDAZOLE 500 MG/100ML IV SOLN
500.0000 mg | Freq: Three times a day (TID) | INTRAVENOUS | Status: AC
  Administered 2020-10-21 – 2020-10-28 (×21): 500 mg via INTRAVENOUS
  Filled 2020-10-20 (×22): qty 100

## 2020-10-20 MED ORDER — INSULIN ASPART 100 UNIT/ML IJ SOLN
0.0000 [IU] | INTRAMUSCULAR | Status: DC
  Administered 2020-10-20 (×2): 1 [IU] via SUBCUTANEOUS
  Administered 2020-10-22 – 2020-10-23 (×9): 2 [IU] via SUBCUTANEOUS
  Administered 2020-10-23 – 2020-10-25 (×6): 1 [IU] via SUBCUTANEOUS
  Administered 2020-10-25: 2 [IU] via SUBCUTANEOUS
  Administered 2020-10-25 – 2020-10-26 (×5): 1 [IU] via SUBCUTANEOUS
  Filled 2020-10-20: qty 0.09

## 2020-10-20 MED ORDER — AMIODARONE HCL IN DEXTROSE 360-4.14 MG/200ML-% IV SOLN
30.0000 mg/h | INTRAVENOUS | Status: DC
  Administered 2020-10-20 – 2020-10-22 (×5): 30 mg/h via INTRAVENOUS
  Filled 2020-10-20 (×4): qty 200

## 2020-10-20 MED ORDER — LACTATED RINGERS IV SOLN: INTRAVENOUS | Status: DC

## 2020-10-20 NOTE — Progress Notes (Signed)
Subjective/Chief Complaint: Complains of abd soreness   Objective: Vital signs in last 24 hours: Temp:  [98.8 F (37.1 C)-100.5 F (38.1 C)] 98.8 F (37.1 C) (07/24 0730) Pulse Rate:  [52-105] 101 (07/24 0730) Resp:  [12-28] 20 (07/24 0730) BP: (90-124)/(42-91) 111/79 (07/24 0730) SpO2:  [96 %-100 %] 96 % (07/24 0730) Weight:  VE:[id number] kg] 122 kg (07/23 2019)    Intake/Output from previous day: 07/23 0701 - 07/24 0700 In: -  Out: 900 [Urine:900] Intake/Output this shift: No intake/output data recorded.  General appearance: alert and cooperative Resp: clear to auscultation bilaterally Cardio: regular rate and rhythm GI: distended with mild to moderate tenderness but no guarding  Lab Results:  Recent Labs    10/19/20 2029 10/20/20 0355  WBC 22.7* 20.0*  HGB 11.5* 10.9*  HCT 36.0* 33.4*  PLT 384 344   BMET Recent Labs    10/19/20 2029 10/20/20 0355  NA 134* 134*  K 4.3 3.4*  CL 102 106  CO2 16* 15*  GLUCOSE 182* 147*  BUN 127* 110*  CREATININE 3.92* 3.28*  CALCIUM 8.3* 8.1*   PT/INR Recent Labs    10/19/20 2029 10/20/20 0355  LABPROT 16.2* 15.2  INR 1.3* 1.2   ABG No results for input(s): PHART, HCO3 in the last 72 hours.  Invalid input(s): PCO2, PO2  Studies/Results: CT ABDOMEN PELVIS WO CONTRAST  Result Date: 10/19/2020 CLINICAL DATA:  Abdominal pain for 2 weeks. Diarrhea for a few days. EXAM: CT ABDOMEN AND PELVIS WITHOUT CONTRAST TECHNIQUE: Multidetector CT imaging of the abdomen and pelvis was performed following the standard protocol without IV contrast. COMPARISON:  None. FINDINGS: Lower chest: Lung bases show bronchial wall thickening and minor subsegmental atelectasis. Heart borderline enlarged. Three-vessel coronary artery calcifications. Hepatobiliary: Liver diffusely low in attenuation. There is central volume loss with relative enlargement of the caudate lobe and lateral segment of the left lobe. No liver mass. Gallbladder is collapsed  around multiple gallstones. No evidence of acute cholecystitis. No bile duct dilation. Pancreas: Unremarkable. No pancreatic ductal dilatation or surrounding inflammatory changes. Spleen: Normal in size without focal abnormality. Adrenals/Urinary Tract: 9 mm right adrenal angiomyolipoma. Adrenal glands otherwise unremarkable. Bilateral renal cortical thinning and bilateral low-attenuation renal masses consistent with cysts. No stones. No hydronephrosis. Normal ureters. Bladder decompressed with a Foley catheter Stomach/Bowel: There is pneumatosis intestinalis involving the cecum and ascending colon. The colon is dilated, most prominently the cecum and ascending colon, maximum diameter 10 cm.: There is a circumferential area of wall thickening and colonic narrowing of the mid sigmoid colon concerning for a constricting mass lesion. This measures approximately 4.7 cm in length. No other areas of colonic wall thickening. There is mild hazy inflammatory change adjacent to the distended cecum and ascending colon. There are small bubbles of extraluminal air in the anterior mid to lower abdomen. Stomach is decompressed, otherwise unremarkable. Small bowel is unremarkable. Vascular/Lymphatic: Minor aortic atherosclerosis. No aneurysm. No enlarged lymph nodes. Reproductive: Unremarkable. Other: Trace amount of ascites tracking along the left pericolic gutter. Musculoskeletal: No fracture or acute finding.  No bone lesion. IMPRESSION: 1. 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 Colon is distended, most evident of the cecum and lower ascending colon, maximum 10 cm. 2. Apparent constricting mass lesion of the sigmoid colon, suspicious for carcinoma, which presumably is the cause of the colonic distension. Suspect the pneumatosis intestinalis is secondary. 3. Minimal amount of extraluminal air in the anterior mid to lower abdomen. 4. No  other acute abnormality within the  abdomen or pelvis. 5. Liver morphologic changes suggesting cirrhosis. No evidence of a liver mass. 6. Chronic findings include gallstones, mild aortic atherosclerosis, renal cortical thinning and renal cysts. Electronically Signed   By: Lajean Manes M.D.   On: 10/19/2020 23:25   DG Chest Port 1 View  Result Date: 10/19/2020 CLINICAL DATA:  Questionable sepsis - evaluate for abnormality EXAM: PORTABLE CHEST 1 VIEW COMPARISON:  03/20/2017 FINDINGS: The heart size and mediastinal contours are within normal limits. Both lungs are clear. The visualized skeletal structures are unremarkable. IMPRESSION: No active disease. Electronically Signed   By: Ulyses Jarred M.D.   On: 10/19/2020 20:58    Anti-infectives: Anti-infectives (From admission, onward)    Start     Dose/Rate Route Frequency Ordered Stop   10/21/20 0600  metroNIDAZOLE (FLAGYL) IVPB 500 mg        500 mg 100 mL/hr over 60 Minutes Intravenous Every 8 hours 10/20/20 0028     10/20/20 2200  ceFEPIme (MAXIPIME) 2 g in sodium chloride 0.9 % 100 mL IVPB        2 g 200 mL/hr over 30 Minutes Intravenous Every 24 hours 10/20/20 0048     10/19/20 2030  ceFEPIme (MAXIPIME) 2 g in sodium chloride 0.9 % 100 mL IVPB        2 g 200 mL/hr over 30 Minutes Intravenous  Once 10/19/20 2029 10/20/20 0033   10/19/20 2030  metroNIDAZOLE (FLAGYL) IVPB 500 mg        500 mg 100 mL/hr over 60 Minutes Intravenous  Once 10/19/20 2029 10/19/20 2210       Assessment/Plan: s/p * No surgery found * Continue bowel rest Pneumatosis: Continue IV abx Sigmoid colon mass. Will need GI to eval Although the wbc is improved this am and he cleared his lactic acidosis quickly he is still very sick and I am worried he may require surgery. Given the findings it is possible that he may end up needing a subtotal colectomy with ileostomy which may be difficult for him to tolerate. I have tried several times to track down his family that makes decisions for him but with no luck.   Will continue with conservative management for now and wait for family  LOS: 0 days    Autumn Messing III 10/20/2020

## 2020-10-20 NOTE — Consult Note (Signed)
[facility] Consultation Note  Referring Provider: [facility] Primary Care Physician:  Terrill Mohr, NP  Reason for Consultation:  Sigmoid colon thickening   HPI: Mark Chang is a 75 y.o. male admitted for abdominal pain, fevers, abdominal distention.  CT findings worrisome for sigmoid mass and proximal colonic/cecal distention and pneumatosis and some small foci of free intraabdominal air in the mid-abdomen.  Patient is learning disabled and not able to provide much history.  Unclear if he's ever had a colonoscopy or whether he's had prior GI issues before.   Past Medical History:  Diagnosis Date   CKD (chronic kidney disease), stage III ([place]) 03/24/2017   COPD (chronic obstructive pulmonary disease) ([place])    DM (diabetes mellitus), type 2 with renal complications ([place]) [id number]   Hyperlipidemia    Hypertension    Mental retardation    Obesity, Class III, BMI 40-49.9 (morbid obesity) ([place]) 03/24/2017   Pernicious anemia     No past surgical history on file.  Prior to Admission medications   Medication Sig Start Date End Date Taking? Authorizing Provider  acetaminophen (TYLENOL) 500 MG tablet Take 500 mg by mouth 3 (three) times daily.   Yes [provider]  atorvastatin (LIPITOR) 40 MG tablet Take 40 mg by mouth at bedtime. 07/03/19  Yes [provider]  dextrose (GLUTOSE) 40 % GEL Take 1 Tube by mouth once as needed for low blood sugar.   Yes [provider]  dicyclomine (BENTYL) 20 MG tablet Take 20 mg by mouth 2 (two) times daily. 10/17/20  Yes [provider]  docusate sodium (COLACE) 100 MG capsule Take 100 mg by mouth 2 (two) times daily.   Yes [provider]  escitalopram (LEXAPRO) 10 MG tablet Take 10 mg by mouth daily.   Yes [provider]  famotidine (PEPCID) 20 MG tablet Take 20 mg by mouth 2 (two) times daily. 06/12/19  Yes [provider]  Fenofibrate 50 MG CAPS Take 50 mg by  mouth every evening. With food 09/27/20  Yes [provider]  furosemide (LASIX) 40 MG tablet Take 40 mg by mouth daily.   Yes [provider]  gabapentin (NEURONTIN) 100 MG capsule Take 100 mg by mouth 2 (two) times daily.  07/03/19  Yes [provider]  insulin aspart (NOVOLOG FLEXPEN) 100 UNIT/ML FlexPen Inject 8 Units into the skin 3 (three) times daily with meals.   Yes [provider]  Insulin Glargine (BASAGLAR KWIKPEN) 100 UNIT/ML Inject 40 Units into the skin every morning. 10/08/20  Yes [provider]  Insulin Glargine (BASAGLAR KWIKPEN) 100 UNIT/ML Inject 10 Units into the skin at bedtime.   Yes [provider]  lisinopril (PRINIVIL,ZESTRIL) 40 MG tablet Take 40 mg by mouth daily.    Yes [provider]  Melatonin 5 MG SUBL Take 10 mg by mouth at bedtime.   Yes [provider]  polyethylene glycol powder (GLYCOLAX/MIRALAX) 17 GM/SCOOP powder Take 17 g by mouth 2 (two) times daily as needed for mild constipation or moderate constipation. Mix with 8 oz of liquid   Yes [provider]  potassium chloride SA (K-DUR,KLOR-CON) 20 MEQ tablet Take 20 mEq by mouth daily.   Yes [provider]  TRULICITY 1.5 0000000 SOPN Inject 1.5 mg into the skin every Thursday. 06/13/19  Yes [provider]  vitamin B-12 (CYANOCOBALAMIN) 1000 MCG tablet Take 1,000 mcg by mouth daily.   Yes [provider]  insulin aspart (NOVOLOG) 100  UNIT/ML injection CBG 70 - 120: 0 units CBG 121 - 150: 2 units CBG 151 - 200: 3 units CBG 201 - 250: 5 units CBG 251 - 300: 8 units CBG 301 - 350: 11 units CBG 351 - 400: 15 units Patient not taking: No sig reported 07/23/16   Hosie Poisson, MD    Current Facility-Administered Medications  Medication Dose Route Frequency Provider Last Rate Last Admin   acetaminophen (TYLENOL) tablet 650 mg  650 mg Oral Q6H PRN Etta Quill, DO       Or   acetaminophen (TYLENOL)  suppository 650 mg  650 mg Rectal Q6H PRN Etta Quill, DO       ceFEPIme (MAXIPIME) 2 g in sodium chloride 0.9 % 100 mL IVPB  2 g Intravenous Q24H [place], Baltazar Najjar, RPH       Chlorhexidine Gluconate Cloth 2 % PADS 6 each  6 each Topical Daily Shawna Clamp, MD   6 each at 10/20/20 0836   HYDROmorphone (DILAUDID) injection 0.5-1 mg  0.5-1 mg Intravenous Q2H PRN Etta Quill, DO       insulin aspart (novoLOG) injection 0-9 Units  0-9 Units Subcutaneous Q4H Etta Quill, DO   1 Units at 10/20/20 [id number]   insulin detemir (LEVEMIR) injection 15 Units  15 Units Subcutaneous BID Etta Quill, DO   15 Units at 10/20/20 [id number]   lactated ringers infusion   Intravenous Continuous Etta Quill, DO 150 mL/hr at 10/20/20 0038 New Bag at 10/20/20 0038   MEDLINE mouth rinse  15 mL Mouth Rinse BID Shawna Clamp, MD       [START ON 10/21/2020] metroNIDAZOLE (FLAGYL) IVPB 500 mg  500 mg Intravenous Q8H Gardner, Jared M, DO       ondansetron [facility]) tablet 4 mg  4 mg Oral Q6H PRN Etta Quill, DO       Or   ondansetron [facility]) injection 4 mg  4 mg Intravenous Q6H PRN Etta Quill, DO        Allergies as of 10/19/2020   (No Known Allergies)    Family History  Family history unknown: Yes    Social History   Socioeconomic History   Marital status: Married    Spouse name: Not on file   Number of children: Not on file   Years of education: Not on file   Highest education level: Not on file  Occupational History   Not on file  Tobacco Use   Smoking status: Never   Smokeless tobacco: Never  Vaping Use   Vaping Use: Never used  Substance and Sexual Activity   Alcohol use: No   Drug use: Not on file   Sexual activity: Not on file  Other Topics Concern   Not on file  Social History Narrative   Not on file   Social Determinants of Health   Financial Resource Strain: Not on file  Food Insecurity: Not on file  Transportation Needs: Not on file  Physical Activity: Not  on file  Stress: Not on file  Social Connections: Not on file  Intimate Partner Violence: Not on file    Review of Systems: As per HPI, all others negative  Physical Exam: Vital signs in last 24 hours: Temp:  [98.8 F (37.1 C)-100.5 F (38.1 C)] 98.8 F (37.1 C) (07/24 0730) Pulse Rate:  [52-105] 105 (07/24 0817) Resp:  [12-28] 26 (07/24 0817) BP: (90-131)/(42-91) 131/77 (07/24 0817) SpO2:  [96 %-100 %] 98 % (  07/24 0817) Weight:  [id number] kg] 122 kg (07/23 2019)   General:   Alert, overweight, Well-developed, well-nourished, pleasant and cooperative in NAD Head:  Normocephalic and atraumatic. Eyes:  Sclera clear, no icterus.   Conjunctiva pink. Ears:  Normal auditory acuity. Nose:  No deformity, discharge,  or lesions. Mouth:  No deformity or lesions.  Oropharynx pink & moist. Neck:  Supple; no masses or thyromegaly. Abdomen:  Distended and tympanic, mild diffuse tenderness. No masses, hepatosplenomegaly or hernias noted. Normal bowel sounds, without guarding, and without rebound.     Msk:  Symmetrical without gross deformities. Normal posture. Pulses:  Normal pulses noted. Extremities:  Without clubbing or edema. Neurologic:  Alert and  oriented x4;  grossly normal neurologically. Skin:  Intact without significant lesions or rashes. Psych:  Alert and cooperative. Normal mood and affect.   Lab Results: Recent Labs    10/19/20 2029 10/20/20 0355  WBC 22.7* 20.0*  HGB 11.5* 10.9*  HCT 36.0* 33.4*  PLT 384 344   BMET Recent Labs    10/19/20 2029 10/20/20 0355  NA 134* 134*  K 4.3 3.4*  CL 102 106  CO2 16* 15*  GLUCOSE 182* 147*  BUN 127* 110*  CREATININE 3.92* 3.28*  CALCIUM 8.3* 8.1*   LFT Recent Labs    10/20/20 0355  PROT 6.2*  ALBUMIN 2.7*  AST 29  ALT 25  ALKPHOS 49  BILITOT 0.5   PT/INR Recent Labs    10/19/20 2029 10/20/20 0355  LABPROT 16.2* 15.2  INR 1.3* 1.2    Studies/Results: CT ABDOMEN PELVIS WO CONTRAST  Result Date:  10/19/2020 CLINICAL DATA:  Abdominal pain for 2 weeks. Diarrhea for a few days. EXAM: CT ABDOMEN AND PELVIS WITHOUT CONTRAST TECHNIQUE: Multidetector CT imaging of the abdomen and pelvis was performed following the standard protocol without IV contrast. COMPARISON:  None. FINDINGS: Lower chest: Lung bases show bronchial wall thickening and minor subsegmental atelectasis. Heart borderline enlarged. Three-vessel coronary artery calcifications. Hepatobiliary: Liver diffusely low in attenuation. There is central volume loss with relative enlargement of the caudate lobe and lateral segment of the left lobe. No liver mass. Gallbladder is collapsed around multiple gallstones. No evidence of acute cholecystitis. No bile duct dilation. Pancreas: Unremarkable. No pancreatic ductal dilatation or surrounding inflammatory changes. Spleen: Normal in size without focal abnormality. Adrenals/Urinary Tract: 9 mm right adrenal angiomyolipoma. Adrenal glands otherwise unremarkable. Bilateral renal cortical thinning and bilateral low-attenuation renal masses consistent with cysts. No stones. No hydronephrosis. Normal ureters. Bladder decompressed with a Foley catheter Stomach/Bowel: There is pneumatosis intestinalis involving the cecum and ascending colon. The colon is dilated, most prominently the cecum and ascending colon, maximum diameter 10 cm.: There is a circumferential area of wall thickening and colonic narrowing of the mid sigmoid colon concerning for a constricting mass lesion. This measures approximately 4.7 cm in length. No other areas of colonic wall thickening. There is mild hazy inflammatory change adjacent to the distended cecum and ascending colon. There are small bubbles of extraluminal air in the anterior mid to lower abdomen. Stomach is decompressed, otherwise unremarkable. Small bowel is unremarkable. Vascular/Lymphatic: Minor aortic atherosclerosis. No aneurysm. No enlarged lymph nodes. Reproductive: Unremarkable.  Other: Trace amount of ascites tracking along the left pericolic gutter. Musculoskeletal: No fracture or acute finding.  No bone lesion. IMPRESSION: 1. 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 Colon is distended, most evident of the cecum and lower ascending colon, maximum 10 cm. 2. Apparent constricting mass  lesion of the sigmoid colon, suspicious for carcinoma, which presumably is the cause of the colonic distension. Suspect the pneumatosis intestinalis is secondary. 3. Minimal amount of extraluminal air in the anterior mid to lower abdomen. 4. No other acute abnormality within the abdomen or pelvis. 5. Liver morphologic changes suggesting cirrhosis. No evidence of a liver mass. 6. Chronic findings include gallstones, mild aortic atherosclerosis, renal cortical thinning and renal cysts. Electronically Signed   By: Lajean Manes M.D.   On: 10/19/2020 23:25   DG Chest Port 1 View  Result Date: 10/19/2020 CLINICAL DATA:  Questionable sepsis - evaluate for abnormality EXAM: PORTABLE CHEST 1 VIEW COMPARISON:  03/20/2017 FINDINGS: The heart size and mediastinal contours are within normal limits. Both lungs are clear. The visualized skeletal structures are unremarkable. IMPRESSION: No active disease. Electronically Signed   By: Ulyses Jarred M.D.   On: 10/19/2020 20:58    Impression:   Abdominal discomfort. Abdominal distention. Abnormal CT scan:  Pneumatosis in proximal colon, proximal colonic dilatation, small foci free air mid-abdomen, possible sigmoid colon mass. Leukocytosis. Fevers.  Plan:  While ideally discerning the nature of the sigmoid lesion on CT scan would be obtained via sigmoidoscopy, I feel doing this procedure is contraindicated in setting of signs of sepsis/ischemia (hypotension, fevers on arrival, pneumatosis on scan) as well as at least some degree of bowel perforation (foci of intraabdominal free air).   Patient's clinical  scenario (ability to understand and consent) and difficulty finding family members to discuss case is also confounding matters but, regardless, I feel the risks of sigmoidoscopy far outweigh the benefits and it is not clear to me how any such sigmoidoscopy findings would dramatically alter surgical plans in the acute- and short-term (regardless, I think the procedure is nevertheless still contraindicated for reasons above). [facility] will follow along at a distance; we have no plans for any endoscopic intervention at this time. Thank you for the consultation.   LOS: 0 days   Alliene Klugh M  10/20/2020, 10:04 AM  Cell [phone] If no answer or after 5 PM call [phone]

## 2020-10-20 NOTE — Progress Notes (Signed)
Pt cardiac rhythm changed while I was in the room. HR up up as high as 160s. Pt complaining of chest pain, which is a new onset. EKG was done & showed A Fib RVR. Dr. Dwyane Dee notified. Order to give 500cc bolus. Cardiology being consulted.

## 2020-10-20 NOTE — Care Plan (Signed)
This 75 years old male with PMH significant for DM2, HTN, intellectual disability, CKD stage IIIa presented in the ED with c/o: abdominal pain, diarrhea and hypotension.  As per skilled nursing staff patient reports having abdominal pain for the last couple of weeks.  He was found to have a blood pressure of 68/40, temp 100.5, HR 105, blood pressure improved with fluids.  Lactic acid 2.2, WBC 22.7.  CT abdomen shows minimal amount of free air,  cecal mass probably sigmoid colon.  General surgery and GI is consulted.  GI recommended no endoscopic procedure needed at this time.  Patient might require surgery(subtotal colectomy with ileostomy) continue conservative management now until general surgery reach out to the family to get consent. Patient complained of chest pain.  EKG shows new onset A. fib with RVR with hypotension.  Cardiology consulted, patient started on amiodarone drip.  Continue to monitor

## 2020-10-20 NOTE — Consult Note (Signed)
Reason for Consult:abd distension Referring Physician: Dr. Lina Sar is an 75 y.o. male.  HPI: The patient is a 75 year old white male who presents to the emergency department from a nursing home with 2 weeks history of intermittent abdominal pain with diarrhea.  He denies any nausea or vomiting.  He denies any fevers or chills.  He does have some developmental delay.  He is a poor historian.  Past Medical History:  Diagnosis Date   CKD (chronic kidney disease), stage III ([place]) 03/24/2017   COPD (chronic obstructive pulmonary disease) ([place])    DM (diabetes mellitus), type 2 with renal complications ([place]) 123XX123   Hyperlipidemia    Hypertension    Mental retardation    Obesity, Class III, BMI 40-49.9 (morbid obesity) ([place]) 03/24/2017   Pernicious anemia     No past surgical history on file.  No family history on file.  Social History:  reports that he has never smoked. He has never used smokeless tobacco. He reports that he does not drink alcohol. No history on file for drug use.  Allergies: No Known Allergies  Medications: I have reviewed the patient's current medications.  Results for orders placed or performed during the hospital encounter of 10/19/20 (from the past 48 hour(s))  Resp Panel by RT-PCR (Flu A&B, Covid) Nasopharyngeal Swab     Status: None   Collection Time: 10/19/20  8:29 PM   Specimen: Nasopharyngeal Swab; Nasopharyngeal(NP) swabs in vial transport medium  Result Value Ref Range   SARS Coronavirus 2 by RT PCR NEGATIVE NEGATIVE    Comment: (NOTE) SARS-CoV-2 target nucleic acids are NOT DETECTED.  The SARS-CoV-2 RNA is generally detectable in upper respiratory specimens during the acute phase of infection. The lowest concentration of SARS-CoV-2 viral copies this assay can detect is 138 copies/mL. A negative result does not preclude SARS-Cov-2 infection and should not be used as the sole basis for treatment or other patient management  decisions. A negative result may occur with  improper specimen collection/handling, submission of specimen other than nasopharyngeal swab, presence of viral mutation(s) within the areas targeted by this assay, and inadequate number of viral copies(<138 copies/mL). A negative result must be combined with clinical observations, patient history, and epidemiological information. The expected result is Negative.  Fact Sheet for Patients:  EntrepreneurPulse.com.au  Fact Sheet for Healthcare Providers:  IncredibleEmployment.be  This test is no t yet approved or cleared by the [place] FDA and  has been authorized for detection and/or diagnosis of SARS-CoV-2 by FDA under an Emergency Use Authorization (EUA). This EUA will remain  in effect (meaning this test can be used) for the duration of the COVID-19 declaration under Section 564(b)(1) of the Act, 21 U.S.C.section 360bbb-3(b)(1), unless the authorization is terminated  or revoked sooner.       Influenza A by PCR NEGATIVE NEGATIVE   Influenza B by PCR NEGATIVE NEGATIVE    Comment: (NOTE) The Xpert Xpress SARS-CoV-2/FLU/RSV plus assay is intended as an aid in the diagnosis of influenza from Nasopharyngeal swab specimens and should not be used as a sole basis for treatment. Nasal washings and aspirates are unacceptable for Xpert Xpress SARS-CoV-2/FLU/RSV testing.  Fact Sheet for Patients: EntrepreneurPulse.com.au  Fact Sheet for Healthcare Providers: IncredibleEmployment.be  This test is not yet approved or cleared by the [place] FDA and has been authorized for detection and/or diagnosis of SARS-CoV-2 by FDA under an Emergency Use Authorization (EUA). This EUA will remain in effect (meaning this test can be  used) for the duration of the COVID-19 declaration under Section 564(b)(1) of the Act, 21 U.S.C. section 360bbb-3(b)(1), unless the authorization  is terminated or revoked.  Performed at [facility], [place] [address], [place], [place] [address]   Lactic acid, plasma     Status: Abnormal   Collection Time: 10/19/20  8:29 PM  Result Value Ref Range   Lactic Acid, Venous 2.2 (HH) 0.5 - 1.9 mmol/L    Comment: CRITICAL RESULT CALLED TO, READ BACK BY AND VERIFIED WITH: JANNINE NASH AT 2109 ON 10/19/20 BY Buel Ream Performed at [facility], [place] [address] [place], [place] [address]   Comprehensive metabolic panel     Status: Abnormal   Collection Time: 10/19/20  8:29 PM  Result Value Ref Range   Sodium 134 (L) 135 - 145 mmol/L   Potassium 4.3 3.5 - 5.1 mmol/L   Chloride 102 98 - 111 mmol/L   CO2 16 (L) 22 - 32 mmol/L   Glucose, Bld 182 (H) 70 - 99 mg/dL    Comment: Glucose reference range applies only to samples taken after fasting for at least 8 hours.   BUN 127 (H) 8 - 23 mg/dL    Comment: RESULTS CONFIRMED BY MANUAL DILUTION   Creatinine, Ser 3.92 (H) 0.61 - 1.24 mg/dL   Calcium 8.3 (L) 8.9 - 10.3 mg/dL   Total Protein 6.9 6.5 - 8.1 g/dL   Albumin 2.8 (L) 3.5 - 5.0 g/dL   AST 33 15 - 41 U/L   ALT 26 0 - 44 U/L   Alkaline Phosphatase 50 38 - 126 U/L   Total Bilirubin 0.6 0.3 - 1.2 mg/dL   GFR, Estimated 15 (L) >60 mL/min    Comment: (NOTE) Calculated using the CKD-EPI Creatinine Equation (2021)    Anion gap 16 (H) 5 - 15    Comment: Performed at [facility], [place] [address], [place], [place] [address]  CBC WITH DIFFERENTIAL     Status: Abnormal   Collection Time: 10/19/20  8:29 PM  Result Value Ref Range   WBC 22.7 (H) 4.0 - 10.5 K/uL   RBC 4.01 (L) 4.22 - 5.81 MIL/uL   Hemoglobin 11.5 (L) 13.0 - 17.0 g/dL   HCT 36.0 (L) 39.0 - 52.0 %   MCV 89.8 80.0 - 100.0 fL   MCH 28.7 26.0 - 34.0 pg   MCHC 31.9 30.0 - 36.0 g/dL   RDW 14.1 11.5 - 15.5 %   Platelets 384 150 - 400 K/uL   nRBC 0.0 0.0 - 0.2 %   Neutrophils Relative % 82 %   Neutro Abs 18.6 (H) 1.7 - 7.7 K/uL    Lymphocytes Relative 5 %   Lymphs Abs 1.2 0.7 - 4.0 K/uL   Monocytes Relative 10 %   Monocytes Absolute 2.2 (H) 0.1 - 1.0 K/uL   Eosinophils Relative 0 %   Eosinophils Absolute 0.0 0.0 - 0.5 K/uL   Basophils Relative 0 %   Basophils Absolute 0.1 0.0 - 0.1 K/uL   Immature Granulocytes 3 %   Abs Immature Granulocytes 0.67 (H) 0.00 - 0.07 K/uL    Comment: Performed at [facility], [place] [address] [place], [place] [address]  Protime-INR     Status: Abnormal   Collection Time: 10/19/20  8:29 PM  Result Value Ref Range   Prothrombin Time 16.2 (H) 11.4 - 15.2 seconds   INR 1.3 (H) 0.8 - 1.2    Comment: (NOTE) INR goal varies  based on device and disease states. Performed at [facility], [place] [address], [place], [place] [address]   APTT     Status: None   Collection Time: 10/19/20  8:29 PM  Result Value Ref Range   aPTT 29 24 - 36 seconds    Comment: Performed at [facility], [place] [address], [place], [place] [address]  Urinalysis, Routine w reflex microscopic Urine, Catheterized     Status: Abnormal   Collection Time: 10/19/20  8:29 PM  Result Value Ref Range   Color, Urine YELLOW YELLOW   APPearance HAZY (A) CLEAR   Specific Gravity, Urine 1.015 1.005 - 1.030   pH 5.0 5.0 - 8.0   Glucose, UA NEGATIVE NEGATIVE mg/dL   Hgb urine dipstick NEGATIVE NEGATIVE   Bilirubin Urine NEGATIVE NEGATIVE   Ketones, ur NEGATIVE NEGATIVE mg/dL   Protein, ur NEGATIVE NEGATIVE mg/dL   Nitrite NEGATIVE NEGATIVE   Leukocytes,Ua NEGATIVE NEGATIVE    Comment: Performed at [place] [address], [place], [place] [address]  Lactic acid, plasma     Status: None   Collection Time: 10/19/20 10:55 PM  Result Value Ref Range   Lactic Acid, Venous 1.5 0.5 - 1.9 mmol/L    Comment: Performed at [facility], [place] [address] [place],  [address]    CT ABDOMEN PELVIS WO CONTRAST  Result Date:  10/19/2020 CLINICAL DATA:  Abdominal pain for 2 weeks. Diarrhea for a few days. EXAM: CT ABDOMEN AND PELVIS WITHOUT CONTRAST TECHNIQUE: Multidetector CT imaging of the abdomen and pelvis was performed following the standard protocol without IV contrast. COMPARISON:  None. FINDINGS: Lower chest: Lung bases show bronchial wall thickening and minor subsegmental atelectasis. Heart borderline enlarged. Three-vessel coronary artery calcifications. Hepatobiliary: Liver diffusely low in attenuation. There is central volume loss with relative enlargement of the caudate lobe and lateral segment of the left lobe. No liver mass. Gallbladder is collapsed around multiple gallstones. No evidence of acute cholecystitis. No bile duct dilation. Pancreas: Unremarkable. No pancreatic ductal dilatation or surrounding inflammatory changes. Spleen: Normal in size without focal abnormality. Adrenals/Urinary Tract: 9 mm right adrenal angiomyolipoma. Adrenal glands otherwise unremarkable. Bilateral renal cortical thinning and bilateral low-attenuation renal masses consistent with cysts. No stones. No hydronephrosis. Normal ureters. Bladder decompressed with a Foley catheter Stomach/Bowel: There is pneumatosis intestinalis involving the cecum and ascending colon. The colon is dilated, most prominently the cecum and ascending colon, maximum diameter 10 cm.: There is a circumferential area of wall thickening and colonic narrowing of the mid sigmoid colon concerning for a constricting mass lesion. This measures approximately 4.7 cm in length. No other areas of colonic wall thickening. There is mild hazy inflammatory change adjacent to the distended cecum and ascending colon. There are small bubbles of extraluminal air in the anterior mid to lower abdomen. Stomach is decompressed, otherwise unremarkable. Small bowel is unremarkable. Vascular/Lymphatic: Minor aortic atherosclerosis. No aneurysm. No enlarged lymph nodes. Reproductive: Unremarkable.  Other: Trace amount of ascites tracking along the left pericolic gutter. Musculoskeletal: No fracture or acute finding.  No bone lesion. IMPRESSION: 1. 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 Colon is distended, most evident of the cecum and lower ascending colon, maximum 10 cm. 2. Apparent constricting mass lesion of the sigmoid colon, suspicious for carcinoma, which presumably is the cause of the colonic distension. Suspect the pneumatosis intestinalis is secondary. 3. Minimal amount of extraluminal air in the anterior mid to  lower abdomen. 4. No other acute abnormality within the abdomen or pelvis. 5. Liver morphologic changes suggesting cirrhosis. No evidence of a liver mass. 6. Chronic findings include gallstones, mild aortic atherosclerosis, renal cortical thinning and renal cysts. Electronically Signed   By: Lajean Manes M.D.   On: 10/19/2020 23:25   DG Chest Port 1 View  Result Date: 10/19/2020 CLINICAL DATA:  Questionable sepsis - evaluate for abnormality EXAM: PORTABLE CHEST 1 VIEW COMPARISON:  03/20/2017 FINDINGS: The heart size and mediastinal contours are within normal limits. Both lungs are clear. The visualized skeletal structures are unremarkable. IMPRESSION: No active disease. Electronically Signed   By: Ulyses Jarred M.D.   On: 10/19/2020 20:58    Review of Systems  Constitutional: Negative.   HENT: Negative.    Eyes: Negative.   Respiratory: Negative.    Cardiovascular: Negative.   Gastrointestinal:  Positive for abdominal pain and diarrhea.  Endocrine: Negative.   Genitourinary: Negative.   Musculoskeletal: Negative.   Skin: Negative.   Allergic/Immunologic: Negative.   Neurological: Negative.   Hematological: Negative.   Psychiatric/Behavioral:  Positive for confusion.   Blood pressure (!) 119/57, pulse 99, temperature 99 F (37.2 C), temperature source Oral, resp. rate (!) 21, height '5\' 8"'$  (1.727 m), weight 122  kg, SpO2 98 %. Physical Exam Constitutional:      General: He is not in acute distress.    Appearance: Normal appearance.  HENT:     Head: Normocephalic and atraumatic.     Right Ear: External ear normal.     Left Ear: External ear normal.     Nose: Nose normal.     Mouth/Throat:     Mouth: Mucous membranes are dry.     Pharynx: Oropharynx is clear.  Eyes:     General: No scleral icterus.    Extraocular Movements: Extraocular movements intact.     Conjunctiva/sclera: Conjunctivae normal.     Pupils: Pupils are equal, round, and reactive to light.  Cardiovascular:     Rate and Rhythm: Normal rate and regular rhythm.     Pulses: Normal pulses.     Heart sounds: Normal heart sounds.  Pulmonary:     Effort: Pulmonary effort is normal. No respiratory distress.     Breath sounds: Normal breath sounds.  Abdominal:     General: There is distension.     Palpations: Abdomen is soft.     Comments: There is minimal tenderness and no guarding or peritonitis  Musculoskeletal:        General: No swelling or deformity. Normal range of motion.     Cervical back: Normal range of motion and neck supple. No tenderness.  Skin:    General: Skin is warm and dry.  Neurological:     General: No focal deficit present.     Mental Status: He is alert. He is disoriented.  Psychiatric:        Mood and Affect: Mood normal.        Behavior: Behavior normal.    Assessment/Plan: The patient appears to have some distention of the right colon with some findings of pneumatosis on the CT scan.  He does have an elevated white count but is abdomen is actually quite soft.  Because of this I think it would be reasonable given his underlying medical conditions to treat him conservatively now with bowel rest and broad-spectrum antibiotic therapy.  He also appears to have an acute kidney injury so he will need to be resuscitated by the medical  team and his kidney function monitored closely.  He also appears to have a  mass in the sigmoid colon and this may also need to be evaluated by gastroenterology.  We have been unable to get in touch with his family who apparently has his power of attorney.  We will continue to try to get in touch with them.  We will follow him closely with you.  Autumn Messing III 10/20/2020, 12:59 AM

## 2020-10-20 NOTE — Progress Notes (Signed)
Communication took place with ED RN, fluids and one of the abx's have been given with IV team there to assist with IV site in order to give scheduled IV Abx

## 2020-10-20 NOTE — H&P (Addendum)
History and Physical    Mark Chang [id number] DOB: 10-28-1945 DOA: 10/19/2020  PCP: Terrill Mohr, NP  Patient coming from: SNF  I have personally briefly reviewed patient's old medical records in [place]  Chief Complaint: Abd pain  HPI: Mark Chang is a 75 y.o. male with medical history significant of DM2, HTN, intellectual disability, CKD 3.  Pt presents to the ED with c/o abd pain, diarrhea, low BP.  According to SNF report pt with abd pain for past couple of weeks.  Today pt hypotensive so EMS called.  Pt answers questions but not able to give a great history.  Says he feels "better" at the moment, only mild abdominal discomfort.  BP with EMS as low as 68/40 then 86/50.   ED Course: In the ED pt is septic with Tm 100.5, HR 105, initial BP 90/57 improved to 117/61 after IVF bolus.  Wbc 22.7K.  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  Lactate 2.2, improved to 1.5 then 1.3 on repeats.  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  Past Medical History:  Diagnosis Date   CKD (chronic kidney disease), stage III ([place]) 03/24/2017   COPD (chronic obstructive pulmonary disease) ([place])    DM (diabetes mellitus), type 2 with renal complications ([place]) 123XX123   Hyperlipidemia    Hypertension    Mental retardation    Obesity, Class III, BMI 40-49.9 (morbid obesity) ([place]) 03/24/2017   Pernicious anemia     No past surgical history on file.   reports that he has never smoked. He has never used smokeless tobacco. He reports that he does not drink alcohol. No history on file for drug use.  No Known Allergies  Family History  Family history unknown: Yes   Pt is unable to provide family history.  Prior to Admission medications   Medication Sig Start Date End Date Taking? Authorizing  Provider  acetaminophen (TYLENOL) 500 MG tablet Take 500 mg by mouth 3 (three) times daily.     [provider]  Artificial Tear Solution (TEARS AGAIN) SOLN Place 1 drop into both eyes 2 (two) times daily as needed (dry eyes).    [provider]  atorvastatin (LIPITOR) 40 MG tablet Take 40 mg by mouth at bedtime. 07/03/19   [provider]  docusate sodium (COLACE) 100 MG capsule Take 100 mg by mouth 2 (two) times daily.    [provider]  Docusate Sodium (DIOCTO) 150 MG/15ML syrup 10 mg See admin instructions. Instil 1 ml (10 mg) into each ear and wait 10 minutes, then irrigate with warm water - repeat every month    [provider]  doxycycline (VIBRA-TABS) 100 MG tablet Take 100 mg by mouth 2 (two) times daily. 06/26/19   [provider]  escitalopram (LEXAPRO) 10 MG tablet Take 10 mg by mouth daily.    [provider]  famotidine (PEPCID) 20 MG tablet Take 20 mg by mouth 2 (two) times daily. 06/12/19   [provider]  furosemide (LASIX) 40 MG tablet Take 40 mg by mouth daily.    [provider]  gabapentin (NEURONTIN) 100 MG capsule Take 100 mg by mouth 2 (two) times daily.  07/03/19   [provider]  GLIPIZIDE XL 10 MG 24 hr  tablet Take 10 mg by mouth daily. 07/03/19   [provider]  insulin aspart (NOVOLOG) 100 UNIT/ML injection CBG 70 - 120: 0 units CBG 121 - 150: 2 units CBG 151 - 200: 3 units CBG 201 - 250: 5 units CBG 251 - 300: 8 units CBG 301 - 350: 11 units CBG 351 - 400: 15 units Patient not taking: Reported on 07/03/2019 07/23/16   Hosie Poisson, MD  Insulin Aspart FlexPen 100 UNIT/ML SOPN Inject 8 Units into the skin in the morning, at noon, and at bedtime.  06/16/19   [provider]  Insulin Detemir (LEVEMIR FLEXTOUCH) 100 UNIT/ML Pen Inject 20-60 Units into the skin 2 (two) times daily. Inject 60 units subcutaneously every morning and 20 units at bedtime    [provider]  lisinopril (PRINIVIL,ZESTRIL) 40 MG tablet Take 40 mg by mouth daily.     [provider]  Melatonin 5 MG CAPS Take 10 mg by mouth at bedtime.     [provider]  omega-3 acid ethyl esters (LOVAZA) 1 g capsule Take 1 g by mouth 2 (two) times daily.    [provider]  potassium chloride SA (K-DUR,KLOR-CON) 20 MEQ tablet Take 20 mEq by mouth daily.    [provider]  TRULICITY 1.5 0000000 SOPN Inject 1.5 mg into the skin once a week.  06/13/19   [provider]  vitamin B-12 (CYANOCOBALAMIN) 1000 MCG tablet Take 1,000 mcg by mouth daily.    [provider]    Physical Exam: Vitals:   10/20/20 0000 10/20/20 0030 10/20/20 0100 10/20/20 0130  BP: 112/62 (!) 90/57 (!) 97/48 117/61  Pulse: 100 100 97 100  Resp: (!) 22 (!) 23 (!) 26 (!) 23  Temp:      TempSrc:      SpO2: 97% 97% 97% 100%  Weight:      Height:        Constitutional: NAD, calm, comfortable Eyes: PERRL, lids and conjunctivae normal ENMT: Mucous membranes are moist. Posterior pharynx clear of any exudate or lesions.Normal dentition.  Neck: normal, supple, no masses, no thyromegaly Respiratory: clear to auscultation bilaterally, no wheezing, no crackles. Normal respiratory effort. No accessory muscle use.  Cardiovascular: Regular rate and rhythm, no murmurs / rubs / gallops. No extremity edema. 2+ pedal pulses. No carotid bruits.  Abdomen: Mild TTP, no rebound, no guarding Musculoskeletal: no clubbing / cyanosis. No joint deformity upper and lower extremities. Good ROM, no contractures. Normal muscle tone.  Skin: no rashes, lesions, ulcers. No induration Neurologic: CN 2-12 grossly intact. Sensation intact, DTR normal. Strength 5/5 in all 4.  Psychiatric: Pleasantly confused.   Labs on Admission: I have personally reviewed following labs and imaging studies  CBC: Recent Labs  Lab 10/19/20 2029  WBC 22.7*  NEUTROABS 18.6*  HGB 11.5*  HCT 36.0*   MCV 89.8  PLT 0000000   Basic Metabolic Panel: Recent Labs  Lab 10/19/20 2029  NA 134*  K 4.3  CL 102  CO2 16*  GLUCOSE 182*  BUN 127*  CREATININE 3.92*  CALCIUM 8.3*   GFR: Estimated Creatinine Clearance: 20.4 mL/min (A) (by C-G formula based on SCr of 3.92 mg/dL (H)). Liver Function Tests: Recent Labs  Lab 10/19/20 2029  AST 33  ALT 26  ALKPHOS 50  BILITOT 0.6  PROT 6.9  ALBUMIN 2.8*   No results for input(s): LIPASE, AMYLASE in the last 168 hours. No results for input(s): AMMONIA in the last 168 hours.  Coagulation Profile: Recent Labs  Lab 10/19/20 2029  INR 1.3*   Cardiac Enzymes: No results for input(s): CKTOTAL, CKMB, CKMBINDEX, TROPONINI in the last 168 hours. BNP (last 3 results) No results for input(s): PROBNP in the last 8760 hours. HbA1C: No results for input(s): HGBA1C in the last 72 hours. CBG: No results for input(s): GLUCAP in the last 168 hours. Lipid Profile: No results for input(s): CHOL, HDL, LDLCALC, TRIG, CHOLHDL, LDLDIRECT in the last 72 hours. Thyroid Function Tests: No results for input(s): TSH, T4TOTAL, FREET4, T3FREE, THYROIDAB in the last 72 hours. Anemia Panel: No results for input(s): VITAMINB12, FOLATE, FERRITIN, TIBC, IRON, RETICCTPCT in the last 72 hours. Urine analysis:    Component Value Date/Time   COLORURINE YELLOW 10/19/2020 2029   APPEARANCEUR HAZY (A) 10/19/2020 2029   LABSPEC 1.015 10/19/2020 2029   PHURINE 5.0 10/19/2020 2029   GLUCOSEU NEGATIVE 10/19/2020 2029   HGBUR NEGATIVE 10/19/2020 2029   BILIRUBINUR NEGATIVE 10/19/2020 2029   [place] NEGATIVE 10/19/2020 2029   PROTEINUR NEGATIVE 10/19/2020 2029   NITRITE NEGATIVE 10/19/2020 2029   LEUKOCYTESUR NEGATIVE 10/19/2020 2029    Radiological Exams on Admission: CT ABDOMEN PELVIS WO CONTRAST  Result Date: 10/19/2020 CLINICAL DATA:  Abdominal pain for 2 weeks. Diarrhea for a few days. EXAM: CT ABDOMEN AND PELVIS WITHOUT CONTRAST TECHNIQUE: Multidetector CT  imaging of the abdomen and pelvis was performed following the standard protocol without IV contrast. COMPARISON:  None. FINDINGS: Lower chest: Lung bases show bronchial wall thickening and minor subsegmental atelectasis. Heart borderline enlarged. Three-vessel coronary artery calcifications. Hepatobiliary: Liver diffusely low in attenuation. There is central volume loss with relative enlargement of the caudate lobe and lateral segment of the left lobe. No liver mass. Gallbladder is collapsed around multiple gallstones. No evidence of acute cholecystitis. No bile duct dilation. Pancreas: Unremarkable. No pancreatic ductal dilatation or surrounding inflammatory changes. Spleen: Normal in size without focal abnormality. Adrenals/Urinary Tract: 9 mm right adrenal angiomyolipoma. Adrenal glands otherwise unremarkable. Bilateral renal cortical thinning and bilateral low-attenuation renal masses consistent with cysts. No stones. No hydronephrosis. Normal ureters. Bladder decompressed with a Foley catheter Stomach/Bowel: There is pneumatosis intestinalis involving the cecum and ascending colon. The colon is dilated, most prominently the cecum and ascending colon, maximum diameter 10 cm.: There is a circumferential area of wall thickening and colonic narrowing of the mid sigmoid colon concerning for a constricting mass lesion. This measures approximately 4.7 cm in length. No other areas of colonic wall thickening. There is mild hazy inflammatory change adjacent to the distended cecum and ascending colon. There are small bubbles of extraluminal air in the anterior mid to lower abdomen. Stomach is decompressed, otherwise unremarkable. Small bowel is unremarkable. Vascular/Lymphatic: Minor aortic atherosclerosis. No aneurysm. No enlarged lymph nodes. Reproductive: Unremarkable. Other: Trace amount of ascites tracking along the left pericolic gutter. Musculoskeletal: No fracture or acute finding.  No bone lesion. IMPRESSION: 1.  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 Colon is distended, most evident of the cecum and lower ascending colon, maximum 10 cm. 2. Apparent constricting mass lesion of the sigmoid colon, suspicious for carcinoma, which presumably is the cause of the colonic distension. Suspect the pneumatosis intestinalis is secondary. 3. Minimal amount of extraluminal air in the anterior mid to lower abdomen. 4. No other acute abnormality within the abdomen or pelvis. 5. Liver morphologic changes suggesting cirrhosis. No evidence of a liver mass. 6. Chronic findings include gallstones, mild aortic atherosclerosis, renal cortical thinning and renal  cysts. Electronically Signed   By: Lajean Manes M.D.   On: 10/19/2020 23:25   DG Chest Port 1 View  Result Date: 10/19/2020 CLINICAL DATA:  Questionable sepsis - evaluate for abnormality EXAM: PORTABLE CHEST 1 VIEW COMPARISON:  03/20/2017 FINDINGS: The heart size and mediastinal contours are within normal limits. Both lungs are clear. The visualized skeletal structures are unremarkable. IMPRESSION: No active disease. Electronically Signed   By: Ulyses Jarred M.D.   On: 10/19/2020 20:58    EKG: Independently reviewed.  Assessment/Plan Principal Problem:   Perforated abdominal viscus Active Problems:   Intellectual disability   AKI (acute kidney injury) ([place])   Severe sepsis with acute organ dysfunction ([facility])   DM (diabetes mellitus), type 2 with renal complications ([facility])   CKD (chronic kidney disease), stage III ([facility])   Mass of cecum   Pneumatosis coli    Severe sepsis with AKI secondary to perforated viscus - Sepsis pathway IVF: bolus and then LR at 150 Tele monitor See Dr. Marlou Starks consult note: plan for conservative management for the moment Suggests GI eval so I will put in AM consult for GI to get their opinion. Serial lactates Strict intake and output Repeat AM labs Cefepime + Flagyl BCx Mass of  Cecum and Pneumatosis coli - See Dr. Marlou Starks consult note Check CE[facility] GI consulted for AM AKI on CKD 3 - Due to #1 above IVF Strict intake and output Repeat labs in AM DM2 - Levemir 15u BID for now while NPO Sensitive SSI Q4H HTN - Holding all home BP meds due to sepsis associated hypotension.  DVT prophylaxis: SCDs Code Status: Full Family Communication: Unable to get in touch with brother. Disposition Plan: SNF after sepsis and bowel perf managed Consults called: Dr. Marlou Starks, message sent to Dr. Paulita Fujita for AM GI consult Admission status: Admit to inpatient  Severity of Illness: The appropriate patient status for this patient is INPATIENT. Inpatient status is judged to be reasonable and necessary in order to provide the required intensity of service to ensure the patient's safety. The patient's presenting symptoms, physical exam findings, and initial radiographic and laboratory data in the context of their chronic comorbidities is felt to place them at high risk for further clinical deterioration. Furthermore, it is not anticipated that the patient will be medically stable for discharge from the hospital within 2 midnights of admission. The following factors support the patient status of inpatient.   IP status due to severe sepsis with AKF.  Bowel perf.  Patient has acute kidney injury.  Patient has one of the following: Increase in Serum Creatinine >0.3 mg/dL within 48h Increase in Serum Creatinine > 1.5 times baseline known or presumed to have been within the last 7 days Urine volume < 0.5 ml/kg/hr for 6 hours    * I certify that at the point of admission it is my clinical judgment that the patient will require inpatient hospital care spanning beyond 2 midnights from the point of admission due to high intensity of service, high risk for further deterioration and high frequency of surveillance required.*   Davion Meara M. DO [facility]  How to contact the [facility] Attending or  Consulting provider [place] or covering provider during after hours [place], for this patient?  Check the care team in [facility] and look for a) attending/consulting [facility] provider listed and b) the [facility] team listed Log into [email or web address]  Amion Physician Scheduling and messaging for groups and whole hospitals  On call and physician  scheduling software for group practices, residents, hospitalists and other medical providers for call, clinic, rotation and shift schedules. OnCall Enterprise is a hospital-wide system for scheduling doctors and paging doctors on call. EasyPlot is for scientific plotting and data analysis.  [email or web address]  and use [place]'s universal password to access. If you do not have the password, please contact the hospital operator.  Locate the [facility] provider you are looking for under [facility] and page to a number that you can be directly reached. If you still have difficulty reaching the provider, please page the [facility] (Director on Call) for the Hospitalists listed on amion for assistance.  10/20/2020, 1:42 AM

## 2020-10-20 NOTE — Progress Notes (Signed)
Pharmacy Antibiotic Note  Mark Chang is a 75 y.o. male admitted on 10/19/2020 with  intra-abdominal infection .  Pharmacy has been consulted for Cefepime dosing. Scr acutely elevated with est CrCl <21m/min.  Plan: Cefepime 2gm IV q24h Monitor renal function and cx data    Height: '5\' 8"'$  (172.7 cm) Weight: 122 kg (269 lb) IBW/kg (Calculated) : 68.4  Temp (24hrs), Avg:99.8 F (37.7 C), Min:99 F (37.2 C), Max:100.5 F (38.1 C)  Recent Labs  Lab 10/19/20 2029 10/19/20 2255  WBC 22.7*  --   CREATININE 3.92*  --   LATICACIDVEN 2.2* 1.5    Estimated Creatinine Clearance: 20.4 mL/min (A) (by C-G formula based on SCr of 3.92 mg/dL (H)).    No Known Allergies  Antimicrobials this admission: 7/23 Cefepime >>  7/23 Flagyl >>   Dose adjustments this admission:  Microbiology results: 7/23 BCx:  7/23 UCx:    Thank you for allowing pharmacy to be a part of this patient's care.  MNetta CedarsPharmD 10/20/2020 12:39 AM

## 2020-10-20 NOTE — ED Notes (Signed)
Spoke to [employer] at [employer]. She provided the information that pt's brother is POA and can be reached at [phone]

## 2020-10-21 ENCOUNTER — Inpatient Hospital Stay (HOSPITAL_COMMUNITY): Payer: Medicare HMO

## 2020-10-21 ENCOUNTER — Encounter (HOSPITAL_COMMUNITY): Payer: Self-pay | Admitting: Internal Medicine

## 2020-10-21 DIAGNOSIS — I4891 Unspecified atrial fibrillation: Secondary | ICD-10-CM | POA: Diagnosis not present

## 2020-10-21 DIAGNOSIS — R198 Other specified symptoms and signs involving the digestive system and abdomen: Secondary | ICD-10-CM

## 2020-10-21 DIAGNOSIS — I480 Paroxysmal atrial fibrillation: Secondary | ICD-10-CM

## 2020-10-21 LAB — GLUCOSE, CAPILLARY
Glucose-Capillary: 74 mg/dL (ref 70–99)
Glucose-Capillary: 77 mg/dL (ref 70–99)
Glucose-Capillary: 70 mg/dL (ref 70–99)
Glucose-Capillary: 95 mg/dL (ref 70–99)
Glucose-Capillary: 83 mg/dL (ref 70–99)
Glucose-Capillary: 93 mg/dL (ref 70–99)
Glucose-Capillary: 102 mg/dL — ABNORMAL HIGH (ref 70–99)
Glucose-Capillary: 128 mg/dL — ABNORMAL HIGH (ref 70–99)

## 2020-10-21 LAB — COMPREHENSIVE METABOLIC PANEL
ALT: 30 U/L (ref 0–44)
AST: 54 U/L — ABNORMAL HIGH (ref 15–41)
Albumin: 2.4 g/dL — ABNORMAL LOW (ref 3.5–5.0)
Alkaline Phosphatase: 53 U/L (ref 38–126)
Anion gap: 11 (ref 5–15)
BUN: 91 mg/dL — ABNORMAL HIGH (ref 8–23)
CO2: 16 mmol/L — ABNORMAL LOW (ref 22–32)
Calcium: 8.1 mg/dL — ABNORMAL LOW (ref 8.9–10.3)
Chloride: 112 mmol/L — ABNORMAL HIGH (ref 98–111)
Creatinine, Ser: 2.24 mg/dL — ABNORMAL HIGH (ref 0.61–1.24)
GFR, Estimated: 30 mL/min — ABNORMAL LOW (ref >60)
Glucose, Bld: 67 mg/dL — ABNORMAL LOW (ref 70–99)
Potassium: 2.6 mmol/L — CL (ref 3.5–5.1)
Sodium: 139 mmol/L (ref 135–145)
Total Bilirubin: 0.5 mg/dL (ref 0.3–1.2)
Total Protein: 6.1 g/dL — ABNORMAL LOW (ref 6.5–8.1)

## 2020-10-21 LAB — CBC
HCT: 34.5 % — ABNORMAL LOW (ref 39.0–52.0)
Hemoglobin: 11.3 g/dL — ABNORMAL LOW (ref 13.0–17.0)
MCH: 28.9 pg (ref 26.0–34.0)
MCHC: 32.8 g/dL (ref 30.0–36.0)
MCV: 88.2 fL (ref 80.0–100.0)
Platelets: 369 10*3/uL (ref 150–400)
RBC: 3.91 MIL/uL — ABNORMAL LOW (ref 4.22–5.81)
RDW: 14.4 % (ref 11.5–15.5)
WBC: 17.6 10*3/uL — ABNORMAL HIGH (ref 4.0–10.5)
nRBC: 0 % (ref 0.0–0.2)

## 2020-10-21 LAB — POTASSIUM: Potassium: 2.5 mmol/L — CL (ref 3.5–5.1)

## 2020-10-21 LAB — ECHOCARDIOGRAM COMPLETE
Area-P 1/2: 3.81 cm2
Calc EF: 51.5 %
Height: 68 in
S' Lateral: 3.8 cm
Single Plane A2C EF: 50.9 %
Single Plane A4C EF: 50.6 %
Weight: 4304 oz

## 2020-10-21 LAB — CEA: CEA: 6.3 ng/mL — ABNORMAL HIGH (ref 0.0–4.7)

## 2020-10-21 LAB — URINE CULTURE: Culture: NO GROWTH

## 2020-10-21 LAB — PHOSPHORUS: Phosphorus: 3.8 mg/dL (ref 2.5–4.6)

## 2020-10-21 LAB — HEMOGLOBIN A1C
Hgb A1c MFr Bld: 6.5 % — ABNORMAL HIGH (ref 4.8–5.6)
Mean Plasma Glucose: 140 mg/dL

## 2020-10-21 LAB — MAGNESIUM: Magnesium: 2.6 mg/dL — ABNORMAL HIGH (ref 1.7–2.4)

## 2020-10-21 MED ORDER — DEXTROSE IN LACTATED RINGERS 5 % IV SOLN: INTRAVENOUS | Status: DC

## 2020-10-21 MED ORDER — HEPARIN (PORCINE) 25000 UT/250ML-% IV SOLN: 1500.0000 [IU]/h | INTRAVENOUS | Status: DC

## 2020-10-21 MED ORDER — CHLORHEXIDINE GLUCONATE 0.12 % MT SOLN
15.0000 mL | Freq: Two times a day (BID) | OROMUCOSAL | Status: DC
  Administered 2020-10-21 – 2020-10-24 (×6): 15 mL via OROMUCOSAL
  Filled 2020-10-21 (×5): qty 15

## 2020-10-21 MED ORDER — PERFLUTREN LIPID MICROSPHERE
1.0000 mL | INTRAVENOUS | Status: AC | PRN
  Administered 2020-10-21: 2 mL via INTRAVENOUS

## 2020-10-21 MED ORDER — POTASSIUM CHLORIDE 10 MEQ/100ML IV SOLN
10.0000 meq | INTRAVENOUS | Status: AC
  Administered 2020-10-22 (×6): 10 meq via INTRAVENOUS
  Filled 2020-10-21 (×6): qty 100

## 2020-10-21 MED ORDER — POTASSIUM CHLORIDE 10 MEQ/100ML IV SOLN
10.0000 meq | INTRAVENOUS | Status: AC
  Administered 2020-10-21 (×6): 10 meq via INTRAVENOUS
  Filled 2020-10-21 (×6): qty 100

## 2020-10-21 MED ORDER — SODIUM CHLORIDE 0.9 % IV SOLN
2.0000 g | Freq: Two times a day (BID) | INTRAVENOUS | Status: DC
  Administered 2020-10-21 – 2020-10-22 (×4): 2 g via INTRAVENOUS
  Filled 2020-10-21 (×4): qty 2

## 2020-10-21 MED ORDER — HEPARIN BOLUS VIA INFUSION
4000.0000 [IU] | Freq: Once | INTRAVENOUS | Status: DC
  Filled 2020-10-21: qty 4000

## 2020-10-21 MED ORDER — PNEUMOCOCCAL VAC POLYVALENT 25 MCG/0.5ML IJ INJ
0.5000 mL | INJECTION | INTRAMUSCULAR | Status: DC
  Filled 2020-10-21: qty 0.5

## 2020-10-21 NOTE — TOC Initial Note (Signed)
Transition of Care [facility]) - Initial/Assessment Note    Patient Details  Name: Mark Chang MRN: [id number] Date of Birth: 04-04-1945  Transition of Care [facility]) CM/SW Contact:    Mark Cha, RN Phone Number: 10/21/2020, 8:20 AM  Clinical Narrative:                 74 years old male with PMH significant for DM2, HTN, intellectual disability, CKD stage IIIa presented in the ED with c/o: abdominal pain, diarrhea and hypotension.  As per skilled nursing staff patient reports having abdominal pain for the last couple of weeks.  He was found to have a blood pressure of 68/40, temp 100.5, HR 105, blood pressure improved with fluids.  Lactic acid 2.2, WBC 22.7.  CT abdomen shows minimal amount of free air,  cecal mass probably sigmoid colon.  General surgery and GI is consulted.  GI recommended no endoscopic procedure needed at this time.  Patient might require surgery(subtotal colectomy with ileostomy) continue conservative management now until general surgery reach out to the family to get consent. Patient complained of chest pain.  EKG shows new onset A. fib with RVR with hypotension.  Cardiology consulted, patient started on amiodarone drip. TOC PLAN OF CARE: PATIENT HAS A LEGAL GUARDIAN-BROTHER Mark [phone] WILL FOLLOW FOR TOC NEEDS Expected Discharge Plan: Home/Self Care Barriers to Discharge: Continued Medical Work up   Patient Goals and CMS Choice Patient states their goals for this hospitalization and ongoing recovery are:: to go home CMS Medicare.gov Compare Post Acute Care list provided to:: Legal Guardian Choice offered to / list presented to : [facility] POA / Guardian  Expected Discharge Plan and Services Expected Discharge Plan: Home/Self Care   Discharge Planning Services: CM Consult   Living arrangements for the past 2 months: Single Family Home                                      Prior Living Arrangements/Services Living arrangements for the past 2 months:  Single Family Home Lives with:: Significant Other Patient language and need for interpreter reviewed:: Yes Do you feel safe going back to the place where you live?: Yes            Criminal Activity/Legal Involvement Pertinent to Current Situation/Hospitalization: No - Comment as needed  Activities of Daily Living      Permission Sought/Granted                  Emotional Assessment Appearance:: Appears stated age     Orientation: : Oriented to Self, Oriented to Place, Oriented to  Time, Oriented to Situation Alcohol / Substance Use: Not Applicable Psych Involvement: No (comment)  Admission diagnosis:  Colitis [K52.9] AKI (acute kidney injury) ([place]) [N17.9] Pneumatosis intestinalis [K63.89] Perforated abdominal viscus [R19.8] Intestinal obstruction, unspecified cause, unspecified whether partial or complete ([place]) [K56.609] Sepsis, due to unspecified organism, unspecified whether acute organ dysfunction present [facility]) [A41.9] Patient Active Problem List   Diagnosis Date Noted   Perforated abdominal viscus 10/20/2020   Mass of cecum 10/20/2020   Pneumatosis coli 10/20/2020   Sepsis due to Escherichia coli (E. coli) ([place]) 03/24/2017   E. coli UTI 03/24/2017   Obesity, Class III, BMI 40-49.9 (morbid obesity) ([place]) 03/24/2017   DM (diabetes mellitus), type 2 with renal complications ([place]) 123XX123   CKD (chronic kidney disease), stage III ([place]) 03/24/2017   Intellectual disability 03/20/2017   Hypertension  03/20/2017   AKI (acute kidney injury) ([place]) 03/20/2017   Pernicious anemia 03/20/2017   Severe sepsis with acute organ dysfunction ([place]) 03/20/2017   PCP:  Terrill Mohr, NP Pharmacy:   [facility] [place] [address] [place] [address] Phone: [phone] Fax: [phone]  [facility] [place], [place] [address] [place] [address] Phone: [phone] Fax:  [phone]     Social Determinants of Health (SDOH) Interventions    Readmission Risk Interventions No flowsheet data found.

## 2020-10-21 NOTE — Progress Notes (Signed)
  Echocardiogram 2D Echocardiogram has been performed.  Geoffery Lyons [place] 10/21/2020, 11:42 AM

## 2020-10-21 NOTE — Progress Notes (Signed)
Subjective/Chief Complaint: Says his abdomen is still a little sore, however he did have some diarrhea yesterday and says he feels a little bit better.  I called his brother's phone numbers in the chart and was unable to contact his brother.  Objective: Vital signs in last 24 hours: Temp:  [97 F (36.1 C)-98.4 F (36.9 C)] 97 F (36.1 C) (07/25 0800) Pulse Rate:  [39-167] 87 (07/25 0800) Resp:  [17-42] 23 (07/25 0800) BP: (77-163)/(31-141) 135/53 (07/25 0800) SpO2:  [76 %-100 %] 97 % (07/25 0800) Last BM Date: 10/21/20  Intake/Output from previous day: 07/24 0701 - 07/25 0700 In: 5322.3 [I.V.:4070.2; IV Piggyback:1252.1] Out: 1725 [Urine:1725] Intake/Output this shift: No intake/output data recorded.  General appearance: alert and cooperative Resp: clear to auscultation bilaterally Cardio: regular rate and rhythm GI: distended with mild to moderate tenderness but no guarding  Lab Results:  Recent Labs    10/20/20 0355 10/21/20 0245  WBC 20.0* 17.6*  HGB 10.9* 11.3*  HCT 33.4* 34.5*  PLT 344 369    BMET Recent Labs    10/20/20 0355 10/21/20 0245  NA 134* 139  K 3.4* 2.6*  CL 106 112*  CO2 15* 16*  GLUCOSE 147* 67*  BUN 110* 91*  CREATININE 3.28* 2.24*  CALCIUM 8.1* 8.1*    PT/INR Recent Labs    10/19/20 2029 10/20/20 0355  LABPROT 16.2* 15.2  INR 1.3* 1.2    ABG No results for input(s): PHART, HCO3 in the last 72 hours.  Invalid input(s): PCO2, PO2  Studies/Results: CT ABDOMEN PELVIS WO CONTRAST  Result Date: 10/19/2020 CLINICAL DATA:  Abdominal pain for 2 weeks. Diarrhea for a few days. EXAM: CT ABDOMEN AND PELVIS WITHOUT CONTRAST TECHNIQUE: Multidetector CT imaging of the abdomen and pelvis was performed following the standard protocol without IV contrast. COMPARISON:  None. FINDINGS: Lower chest: Lung bases show bronchial wall thickening and minor subsegmental atelectasis. Heart borderline enlarged. Three-vessel coronary artery  calcifications. Hepatobiliary: Liver diffusely low in attenuation. There is central volume loss with relative enlargement of the caudate lobe and lateral segment of the left lobe. No liver mass. Gallbladder is collapsed around multiple gallstones. No evidence of acute cholecystitis. No bile duct dilation. Pancreas: Unremarkable. No pancreatic ductal dilatation or surrounding inflammatory changes. Spleen: Normal in size without focal abnormality. Adrenals/Urinary Tract: 9 mm right adrenal angiomyolipoma. Adrenal glands otherwise unremarkable. Bilateral renal cortical thinning and bilateral low-attenuation renal masses consistent with cysts. No stones. No hydronephrosis. Normal ureters. Bladder decompressed with a Foley catheter Stomach/Bowel: There is pneumatosis intestinalis involving the cecum and ascending colon. The colon is dilated, most prominently the cecum and ascending colon, maximum diameter 10 cm.: There is a circumferential area of wall thickening and colonic narrowing of the mid sigmoid colon concerning for a constricting mass lesion. This measures approximately 4.7 cm in length. No other areas of colonic wall thickening. There is mild hazy inflammatory change adjacent to the distended cecum and ascending colon. There are small bubbles of extraluminal air in the anterior mid to lower abdomen. Stomach is decompressed, otherwise unremarkable. Small bowel is unremarkable. Vascular/Lymphatic: Minor aortic atherosclerosis. No aneurysm. No enlarged lymph nodes. Reproductive: Unremarkable. Other: Trace amount of ascites tracking along the left pericolic gutter. Musculoskeletal: No fracture or acute finding.  No bone lesion. IMPRESSION: 1. 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 Colon is distended, most evident of the cecum and lower ascending colon, maximum 10 cm. 2. Apparent constricting mass lesion of the sigmoid  colon, suspicious for carcinoma, which  presumably is the cause of the colonic distension. Suspect the pneumatosis intestinalis is secondary. 3. Minimal amount of extraluminal air in the anterior mid to lower abdomen. 4. No other acute abnormality within the abdomen or pelvis. 5. Liver morphologic changes suggesting cirrhosis. No evidence of a liver mass. 6. Chronic findings include gallstones, mild aortic atherosclerosis, renal cortical thinning and renal cysts. Electronically Signed   By: Lajean Manes M.D.   On: 10/19/2020 23:25   DG Chest Port 1 View  Result Date: 10/19/2020 CLINICAL DATA:  Questionable sepsis - evaluate for abnormality EXAM: PORTABLE CHEST 1 VIEW COMPARISON:  03/20/2017 FINDINGS: The heart size and mediastinal contours are within normal limits. Both lungs are clear. The visualized skeletal structures are unremarkable. IMPRESSION: No active disease. Electronically Signed   By: Ulyses Jarred M.D.   On: 10/19/2020 20:58    Anti-infectives: Anti-infectives (From admission, onward)    Start     Dose/Rate Route Frequency Ordered Stop   10/21/20 1000  ceFEPIme (MAXIPIME) 2 g in sodium chloride 0.9 % 100 mL IVPB        2 g 200 mL/hr over 30 Minutes Intravenous Every 12 hours 10/21/20 0742     10/21/20 0600  metroNIDAZOLE (FLAGYL) IVPB 500 mg        500 mg 100 mL/hr over 60 Minutes Intravenous Every 8 hours 10/20/20 0028     10/20/20 2200  ceFEPIme (MAXIPIME) 2 g in sodium chloride 0.9 % 100 mL IVPB  Status:  Discontinued        2 g 200 mL/hr over 30 Minutes Intravenous Every 24 hours 10/20/20 0048 10/21/20 0742   10/19/20 2030  ceFEPIme (MAXIPIME) 2 g in sodium chloride 0.9 % 100 mL IVPB        2 g 200 mL/hr over 30 Minutes Intravenous  Once 10/19/20 2029 10/20/20 0033   10/19/20 2030  metroNIDAZOLE (FLAGYL) IVPB 500 mg        500 mg 100 mL/hr over 60 Minutes Intravenous  Once 10/19/20 2029 10/19/20 2210       Assessment/Plan: Mr. Massucci is a 75 year old male with intellectual disability who presented with  abdominal pain, found to have a sigmoid colon obstruction, which appears to be due to a mass on CT.  He is admitted to the care of the medical service and has been improving for the last few days with conservative mangement.  GI did not feel it was safe to perform a colonoscopy on presentation due to the pneumatosis and dilation of the cecum.  Consideration was made to proceed with Hartman's procedure, however he has been improving with resuscitation.  At this time I recommend continuing medical resuscitation and observing his abdominal exam and plain films of the abdomen.  Hopefully his kidney function and metabolic derangements can be straightened out prior to any surgery.  The surgery team is attempted multiple times on multiple days to contact the patient's power of attorney Kerion Wiebelhaus (brother), and have been unsuccessful.  I called both his home number and mobile number today with no answer.  We contacted the patient's care facility over the weekend to help facilitate this communication.  I will asked the social workers to help track down his family.   I recommend sigmoid colectomy with end colostomy for treatment of the sigmoid mass and evaluation of the viability of the cecum with possible resection.  The procedure itself as well as its risks, benefits and alternatives were discussed with the  patient but I have been unable to contact the POA, brother.  I recommend we proceed with surgery urgently - as soon as his kidney function and metabolic derangements are improved.  We may have to proceed emergently if the patient begins to worsen.     LOS: 1 day    Nickola Major Jerene Yeager 10/21/2020

## 2020-10-21 NOTE — Progress Notes (Signed)
ANTICOAGULATION CONSULT NOTE - Initial Consult  Pharmacy Consult for Heparin Indication: atrial fibrillation  No Known Allergies  Patient Measurements: Height: '5\' 8"'$  (172.7 cm) Weight: 122 kg (269 lb) IBW/kg (Calculated) : 68.4 Heparin Dosing Weight: 96 kg  Vital Signs: Temp: 97 F (36.1 C) (07/25 0800) Temp Source: Axillary (07/25 0800) BP: 135/53 (07/25 0800) Pulse Rate: 87 (07/25 0800)  Labs: Recent Labs    10/19/20 2029 10/20/20 0355 10/21/20 0245  HGB 11.5* 10.9* 11.3*  HCT 36.0* 33.4* 34.5*  PLT 384 344 369  APTT 29  --   --   LABPROT 16.2* 15.2  --   INR 1.3* 1.2  --   CREATININE 3.92* 3.28* 2.24*    Estimated Creatinine Clearance: 35.6 mL/min (A) (by C-G formula based on SCr of 2.24 mg/dL (H)).   Medical History: Past Medical History:  Diagnosis Date   CKD (chronic kidney disease), stage III ([place]) 03/24/2017   COPD (chronic obstructive pulmonary disease) ([facility])    DM (diabetes mellitus), type 2 with renal complications ([place]) [id number]   Hyperlipidemia    Hypertension    Mental retardation    Obesity, Class III, BMI 40-49.9 (morbid obesity) ([place]) 03/24/2017   Pernicious anemia    Assessment: 75 y/o M admitted with sepsis ordered heparin infusion for new-onset atrial fibrillation.   10/21/20 9:20 AM  CHADSVASc=4 Hgb 11.3 (baseline for this admission), PLT 369 aPTT and INR wnl  Goal of Therapy:  Heparin level 0.3-0.7 units/ml Monitor platelets by anticoagulation protocol: Yes   Plan:  Give 4000 units bolus x 1 Start heparin infusion at 1500 units/hr Check anti-Xa level in 8 hours and daily while on heparin Continue to monitor H&H and platelets  Ulice Dash D 10/21/2020,9:19 AM

## 2020-10-21 NOTE — Consult Note (Addendum)
Cardiology Consultation:   Patient ID: Mark Chang; GS:[id number]; Mar 23, 1946   Admit date: 10/19/2020 Date of Consult: 10/21/2020  Primary Care Provider: Terrill Mohr, NP Primary Cardiologist: [facility]  Patient Profile:   Mark Chang is a 75 y.o. male with a hx of HTN, DM2, CKD stage IIIa and intellectual disability who is being seen today for the evaluation of new onset atrial fibrillation with RVR at the request of Dr. Dwyane Dee.  History of Present Illness:   Mark Chang is a 75 year old male with a history as stated above who presented from his skilled nursing facility with a several week history of abdominal pain found to be markedly hypotensive on day of presentation with a BP at 68/40.  He was tachycardic with leukocytosis and a lactic acid at 2.2.  He was treated with for septic shock.  Abdominal CT showed a cecal mass therefore surgery along with GI teams were consulted.  Surgery concerned for the possibility for surgery including a subtotal colectomy with ileostomy however concerns about patient tolerance.  GI was consulted as well with recommendations for no endoscopic procedure at this time due to sepsis.  No success for family contact despite multiple attempts.  Unfortunately, the patient then developed an episode of chest pain found to be in new onset atrial fibrillation with RVR per EKG/telemetry with rates in the 140's.  Due to hypotension, he was started on amiodarone infusion.  HPI difficult to obtain due to patient's intellectual disability and poor recall on the events which led him to his hospitalization.  Currently denies chest pain, shortness of breath, LE edema, orthopnea or palpitations. Still having some abdominal pain. He states he has never had chest pain before this episode. Once HR controlled, chest pain has not returned. He lives at a SNF and walks with a walker for ambulation. He denies recent falls. He is now back in NSR.   Pt underwent an echocardiogram  02/2017 which showed an EF at 45-50% with no RWMA and G2DD performed in the setting of sepsis due to E. coli. 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   Past Medical History:  Diagnosis Date   CKD (chronic kidney disease), stage III ([place]) 03/24/2017   COPD (chronic obstructive pulmonary disease) ([place])    DM (diabetes mellitus), type 2 with renal complications ([place]) [id number]   Hyperlipidemia    Hypertension    Mental retardation    Obesity, Class III, BMI 40-49.9 (morbid obesity) ([place]) 03/24/2017   Pernicious anemia     No past surgical history on file.   Prior to Admission medications   Medication Sig Start Date End Date Taking? Authorizing Provider  acetaminophen (TYLENOL) 500 MG tablet Take 500 mg by mouth 3 (three) times daily.   Yes [provider]  atorvastatin (LIPITOR) 40 MG tablet Take 40 mg by mouth at bedtime. 07/03/19  Yes [provider]  dextrose (GLUTOSE) 40 % GEL Take 1 Tube by mouth once as needed for low blood sugar.   Yes [provider]  dicyclomine (BENTYL) 20 MG tablet Take 20 mg by mouth 2 (two) times daily. 10/17/20  Yes [provider]  docusate sodium (COLACE) 100 MG capsule Take 100 mg by mouth 2 (two) times daily.   Yes [provider]  escitalopram (LEXAPRO) 10 MG tablet Take 10 mg by mouth daily.   Yes [provider]  famotidine (PEPCID) 20 MG tablet Take 20 mg by mouth 2 (two) times daily.  06/12/19  Yes [provider]  Fenofibrate 50 MG CAPS Take 50 mg by mouth every evening. With food 09/27/20  Yes [provider]  furosemide (LASIX) 40 MG tablet Take 40 mg by mouth daily.   Yes [provider]  gabapentin (NEURONTIN) 100 MG capsule Take 100 mg by mouth 2 (two) times daily.  07/03/19  Yes [provider]  insulin aspart (NOVOLOG FLEXPEN) 100 UNIT/ML FlexPen Inject 8 Units into the skin 3 (three) times daily with meals.   Yes [provider]  Insulin  Glargine (BASAGLAR KWIKPEN) 100 UNIT/ML Inject 40 Units into the skin every morning. 10/08/20  Yes [provider]  Insulin Glargine (BASAGLAR KWIKPEN) 100 UNIT/ML Inject 10 Units into the skin at bedtime.   Yes [provider]  lisinopril (PRINIVIL,ZESTRIL) 40 MG tablet Take 40 mg by mouth daily.    Yes [provider]  Melatonin 5 MG SUBL Take 10 mg by mouth at bedtime.   Yes [provider]  polyethylene glycol powder (GLYCOLAX/MIRALAX) 17 GM/SCOOP powder Take 17 g by mouth 2 (two) times daily as needed for mild constipation or moderate constipation. Mix with 8 oz of liquid   Yes [provider]  potassium chloride SA (K-DUR,KLOR-CON) 20 MEQ tablet Take 20 mEq by mouth daily.   Yes [provider]  TRULICITY 1.5 0000000 SOPN Inject 1.5 mg into the skin every Thursday. 06/13/19  Yes [provider]  vitamin B-12 (CYANOCOBALAMIN) 1000 MCG tablet Take 1,000 mcg by mouth daily.   Yes [provider]  insulin aspart (NOVOLOG) 100 UNIT/ML injection CBG 70 - 120: 0 units CBG 121 - 150: 2 units CBG 151 - 200: 3 units CBG 201 - 250: 5 units CBG 251 - 300: 8 units CBG 301 - 350: 11 units CBG 351 - 400: 15 units Patient not taking: No sig reported 07/23/16   Hosie Poisson, MD    Inpatient Medications: Scheduled Meds:  Chlorhexidine Gluconate Cloth  6 each Topical Daily   insulin aspart  0-9 Units Subcutaneous Q4H   insulin detemir  15 Units Subcutaneous BID   mouth rinse  15 mL Mouth Rinse BID   Continuous Infusions:  amiodarone 30 mg/hr (10/21/20 0318)   ceFEPime (MAXIPIME) IV     dextrose 5% lactated ringers 125 mL/hr at 10/21/20 0431   lactated ringers 150 mL/hr at 10/21/20 [id number]   metronidazole     potassium chloride 10 mEq (10/21/20 0824)   PRN Meds: acetaminophen **OR** acetaminophen, HYDROmorphone (DILAUDID) injection, ondansetron **OR** ondansetron (ZOFRAN) IV  Allergies:   No Known Allergies  Social History:    Social History   Socioeconomic History   Marital status: Married    Spouse name: Not on file   Number of children: Not on file   Years of education: Not on file   Highest education level: Not on file  Occupational History   Not on file  Tobacco Use   Smoking status: Never   Smokeless tobacco: Never  Vaping Use   Vaping Use: Never used  Substance and Sexual Activity   Alcohol use: No   Drug use: Not on file   Sexual activity: Not on file  Other Topics Concern   Not on file  Social History Narrative   Not on file   Social Determinants of Health   Financial Resource Strain: Not on file  Food Insecurity: Not on file  Transportation Needs: Not on file  Physical Activity: Not on file  Stress: Not  on file  Social Connections: Not on file  Intimate Partner Violence: Not on file    Family History:   Family History  Family history unknown: Yes   Family Status:  Family Status  Relation Name Status   Brother  Alive    ROS:  Please see the history of present illness.  All other ROS reviewed and negative.     Physical Exam/Data:   Vitals:   10/21/20 0000 10/21/20 0100 10/21/20 0300 10/21/20 0800  BP: (!) 150/92 (!) 137/51    Pulse:      Resp: (!) 22 (!) 23    Temp:   97.9 F (36.6 C) (!) 97 F (36.1 C)  TempSrc:   Oral Axillary  SpO2:      Weight:      Height:        Intake/Output Summary (Last 24 hours) at 10/21/2020 0832 Last data filed at 10/21/2020 0318 Gross per 24 hour  Intake 5322.28 ml  Output 1725 ml  Net 3597.28 ml   Filed Weights   10/19/20 2019  Weight: 122 kg   Body mass index is 40.9 kg/m.   General: Overweight, NAD Neck: Negative for carotid bruits. No JVD Lungs:Clear to ausculation bilaterally. No wheezes, rales, or rhonchi. Breathing is unlabored. Cardiovascular: RRR with S1 S2. No murmurs Abdomen: Soft, non-tender, non-distended with normoactive bowel sounds. No hepatomegaly, No rebound/guarding. No obvious abdominal masses. MSK:  Strength and tone appear normal for age. 5/5 in all extremities Extremities: No edema. No clubbing or cyanosis. DP/PT pulses 2+ bilaterally Neuro: Alert and oriented. No focal deficits. No facial asymmetry. MAE spontaneously. Psych: Responds to questions appropriately with normal affect.    EKG:  The EKG was personally reviewed and demonstrates:  10/20/20 AF with RVR, evidence of anterior/inferior MI, HR 144bpm   Telemetry:  Telemetry was personally reviewed and demonstrates:  10/21/20 NSR with HR in the 70's   Relevant CV Studies:  Echocardiogram 02/2017:   Study Conclusions   - Left ventricle: The cavity size was moderately dilated. There was    mild concentric hypertrophy. Systolic function was mildly    reduced. The estimated ejection fraction was in the range of 45%    to 50%. Wall motion was normal; there were no regional wall    motion abnormalities. Features are consistent with a pseudonormal    left ventricular filling pattern, with concomitant abnormal    relaxation and increased filling pressure (grade 2 diastolic    dysfunction). Doppler parameters are consistent with high    ventricular filling pressure.  - Left atrium: The atrium was mildly dilated.   Laboratory Data:  Chemistry Recent Labs  Lab 10/19/20 2029 10/20/20 0355 10/21/20 0245  NA 134* 134* 139  K 4.3 3.4* 2.6*  CL 102 106 112*  CO2 16* 15* 16*  GLUCOSE 182* 147* 67*  BUN 127* 110* 91*  CREATININE 3.92* 3.28* 2.24*  CALCIUM 8.3* 8.1* 8.1*  GFRNONAA 15* 19* 30*  ANIONGAP 16* 13 11    Total Protein  Date Value Ref Range Status  10/21/2020 6.1 (L) 6.5 - 8.1 g/dL Final   Albumin  Date Value Ref Range Status  10/21/2020 2.4 (L) 3.5 - 5.0 g/dL Final   AST  Date Value Ref Range Status  10/21/2020 54 (H) 15 - 41 U/L Final   ALT  Date Value Ref Range Status  10/21/2020 30 0 - 44 U/L Final   Alkaline Phosphatase  Date Value Ref Range Status  10/21/2020 53 38 -  126 U/L Final   Total Bilirubin   Date Value Ref Range Status  10/21/2020 0.5 0.3 - 1.2 mg/dL Final   Hematology Recent Labs  Lab 10/19/20 2029 10/20/20 0355 10/21/20 0245  WBC 22.7* 20.0* 17.6*  RBC 4.01* 3.78* 3.91*  HGB 11.5* 10.9* 11.3*  HCT 36.0* 33.4* 34.5*  MCV 89.8 88.4 88.2  MCH 28.7 28.8 28.9  MCHC 31.9 32.6 32.8  RDW 14.1 14.2 14.4  PLT 384 344 369   Cardiac EnzymesNo results for input(s): TROPONINI in the last 168 hours. No results for input(s): TROPIPOC in the last 168 hours.  BNPNo results for input(s): BNP, PROBNP in the last 168 hours.  DDimer No results for input(s): DDIMER in the last 168 hours. TSH:  Lab Results  Component Value Date   TSH 0.562 07/19/2016   Lipids: Lab Results  Component Value Date   CHOL 144 03/23/2017   HDL 18 (L) 03/23/2017   LDLCALC 60 03/23/2017   TRIG 330 (H) 03/23/2017   CHOLHDL 8.0 03/23/2017   HgbA1c: Lab Results  Component Value Date   HGBA1C 9.9 (H) 03/23/2017    Radiology/Studies:  CT ABDOMEN PELVIS WO CONTRAST  Result Date: 10/19/2020 CLINICAL DATA:  Abdominal pain for 2 weeks. Diarrhea for a few days. EXAM: CT ABDOMEN AND PELVIS WITHOUT CONTRAST TECHNIQUE: Multidetector CT imaging of the abdomen and pelvis was performed following the standard protocol without IV contrast. COMPARISON:  None. FINDINGS: Lower chest: Lung bases show bronchial wall thickening and minor subsegmental atelectasis. Heart borderline enlarged. Three-vessel coronary artery calcifications. Hepatobiliary: Liver diffusely low in attenuation. There is central volume loss with relative enlargement of the caudate lobe and lateral segment of the left lobe. No liver mass. Gallbladder is collapsed around multiple gallstones. No evidence of acute cholecystitis. No bile duct dilation. Pancreas: Unremarkable. No pancreatic ductal dilatation or surrounding inflammatory changes. Spleen: Normal in size without focal abnormality. Adrenals/Urinary Tract: 9 mm right adrenal angiomyolipoma. Adrenal  glands otherwise unremarkable. Bilateral renal cortical thinning and bilateral low-attenuation renal masses consistent with cysts. No stones. No hydronephrosis. Normal ureters. Bladder decompressed with a Foley catheter Stomach/Bowel: There is pneumatosis intestinalis involving the cecum and ascending colon. The colon is dilated, most prominently the cecum and ascending colon, maximum diameter 10 cm.: There is a circumferential area of wall thickening and colonic narrowing of the mid sigmoid colon concerning for a constricting mass lesion. This measures approximately 4.7 cm in length. No other areas of colonic wall thickening. There is mild hazy inflammatory change adjacent to the distended cecum and ascending colon. There are small bubbles of extraluminal air in the anterior mid to lower abdomen. Stomach is decompressed, otherwise unremarkable. Small bowel is unremarkable. Vascular/Lymphatic: Minor aortic atherosclerosis. No aneurysm. No enlarged lymph nodes. Reproductive: Unremarkable. Other: Trace amount of ascites tracking along the left pericolic gutter. Musculoskeletal: No fracture or acute finding.  No bone lesion. IMPRESSION: 1. 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 Colon is distended, most evident of the cecum and lower ascending colon, maximum 10 cm. 2. Apparent constricting mass lesion of the sigmoid colon, suspicious for carcinoma, which presumably is the cause of the colonic distension. Suspect the pneumatosis intestinalis is secondary. 3. Minimal amount of extraluminal air in the anterior mid to lower abdomen. 4. No other acute abnormality within the abdomen or pelvis. 5. Liver morphologic changes suggesting cirrhosis. No evidence of a liver mass. 6. Chronic findings include gallstones, mild aortic atherosclerosis, renal cortical thinning and renal cysts. Electronically  Signed   By: Lajean Manes M.D.   On: 10/19/2020 23:25   DG Chest Port  1 View  Result Date: 10/19/2020 CLINICAL DATA:  Questionable sepsis - evaluate for abnormality EXAM: PORTABLE CHEST 1 VIEW COMPARISON:  03/20/2017 FINDINGS: The heart size and mediastinal contours are within normal limits. Both lungs are clear. The visualized skeletal structures are unremarkable. IMPRESSION: No active disease. Electronically Signed   By: Ulyses Jarred M.D.   On: 10/19/2020 20:58    Assessment and Plan:   1.  New onset atrial fibrillation: -Patient presented with a several week hx of abdominal pain found to be in septic shock on ED presentation.  He was treated with IV fluids and empiric antibiotics.  Abdominal CT scan as below.  Unfortunately, on 10/20/2020 patient complained of mild chest pain found to be in new onset atrial fibrillation with RVR with rates in the 140s.  Due to persistent hypotension, he was placed on IV amiodarone.  He has since converted to normal sinus rhythm with PVCs with no recurrence of chest pain.  He has an elevated CHA2DS2-VASc score of 4 however urgent surgery is requested by surgical team.  Holding off on anticoagulation.    He reports no prior history of palpitations or chest pain.  It appears he underwent an echocardiogram 02/2017 which showed a mildly decreased LVEF at 45 to 50% with no regional wall motion abnormalities and G2 DD.  He does not appear to be fluid volume overloaded on exam however would recommend obtaining an echocardiogram this admission due to IV fluid hydration.   -Continue IV amiodarone for now with plans to transition to oral dosing, likely tomorrow  -We will hold off on anticoagulation given surgical recommendation for urgent surgery.    2.  Septic shock with concern for perforated abdominal viscus: -Patient presented with a several week history of abdominal pain found to have cecal mass and pneumatosis coli per CT imaging.  General surgery along with GI have been consulted.  Given evidence of sepsis with hypotension, leukocytosis and  tachycardia patient was treated with broad-spectrum antibiotic therapy.  In review of surgery note from this morning, appears that they would wish to proceed with urgent colectomy. -Management per primary team, surgery  3.  AKI: -Creatinine on presentation greater than 3.0 which is slightly improved today at 2.24 -Baseline appears to be in the 1.2-1.6 range per lab work from 2018 -Continue to monitor with daily BMET  4.  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  Echocardiogram performed 02/2017 with LVEF 45 to 50% with G2 DD and no regional wall motion abnormalities.  He has been treated with home lisinopril 40 mg daily and Lasix 40 mg daily -He does not appear to be fluid volume overloaded on exam however would recommend obtaining a repeat echocardiogram this admission -Once AKI and hypotension, restart lisinopril  5.  Hypotension: -Appears to be somewhat resolved per vitals today with BP at 135/53  -Continue to monitor -On home antihypertensives>> held  6.  DM2: -Last hemoglobin A1c, 9.9 from 02/2017 -SSI while inpatient   For questions or updates, please contact [place] Please consult [email or web address] for contact info under Cardiology/STEMI.   SignedKathyrn Drown NP-C [facility] Pager: [phone] 10/21/2020 8:32 AM  Personally seen and examined. Agree with above.  75 year old with newly discovered atrial fibrillation rapid ventricular response in the setting of septic shock, lactic acidosis, cecal mass. Currently laying comfortably in bed.  Abdomen distended.  No chest pain no shortness of breath.  GEN: Well nourished, well developed, in no acute distress HEENT: normal Neck: no JVD, carotid bruits, or masses Cardiac: RRR; no murmurs, rubs, or gallops,no edema  Respiratory:  clear to auscultation bilaterally, normal work of breathing GI: soft, nontender, mildly distended, + BS MS: no deformity or  atrophy Skin: warm and dry, no rash Neuro:  Alert and Oriented x 3, Strength and sensation are intact Psych: euthymic mood, full affect  Echocardiogram back in 2018 showed EF of 45 to 50% with no wall motion abnormalities.  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  Last EKG on 10/20/2020 at 1321 showed atrial fibrillation with rapid ventricular response.  Currently telemetry shows sinus rhythm heart rate in the 80s.  Creatinine has decreased from 3.28 down to 2.24, potassium low at 2.6-for bleeding, white count is down from 20-17.6.  A.m. cortisol normal at 61.  Assessment and plan:  75 year old with sepsis, sigmoid mass, atrial fibrillation with rapid ventricular response paroxysmal now in sinus rhythm.  Paroxysmal atrial fibrillation - On IV amiodarone secondary to hypotension.  Continue.  Transition to p.o. when able. - We will hold off on chronic anticoagulation at this time given urgent needs for surgery.  Bleeding risks outweigh benefits.  When able, we will consider Eliquis.  Hypokalemia - 2.6 this morning.  Replete per primary team.  Septic shock with concern of perforated abdominal viscus - Pneumatosis noted on CT imaging.  General surgery recommending urgent surgery.  He may proceed from a cardiovascular perspective with low to moderate overall risk.  Atrial fibrillation is now under good control with sinus rhythm, IV amiodarone.  It would not be unusual to see him reverted back to atrial fibrillation at some point given pro inflammatory state.  Continue with IV amiodarone.  Hold off on anticoagulation at this time as surgery is pending. -Continue with broad-spectrum antibiotics.  AKI - Mild improvement in creatinine.  Baseline 1.2-1.6.  Mild cardiomyopathy - Prior EF 45 to 50% in 2018.  He has been treated at home with lisinopril 40 mg a day and Lasix 40 mg a day.  Obviously his ACE inhibitor has been held given his acute kidney injury.  Continue to hold.  When  able we will likely resume.  Diabetes - Last hemoglobin A1c 9.9 in 2018.  Currently on sliding scale insulin.  Per primary team.  Candee Furbish, MD

## 2020-10-21 NOTE — Progress Notes (Signed)
Date and time results received: 10/21/20 2215 (use smartphrase ".now" to insert current time)  Test: Potassium  Critical Value: 2.5  Name of Provider Notified: [facility] floor coverage  Orders Received? Or Actions Taken?: Received orders for 6 runs of potassium

## 2020-10-21 NOTE — Progress Notes (Signed)
PHARMACY NOTE:  ANTIMICROBIAL RENAL DOSAGE ADJUSTMENT  Current antimicrobial regimen includes a mismatch between antimicrobial dosage and estimated renal function.  As per policy approved by the Pharmacy & Therapeutics and Medical Executive Committees, the antimicrobial dosage will be adjusted accordingly.  Current antimicrobial dosage:  cefepime 2 g q 24h  Indication: IAI  Renal Function:  Estimated Creatinine Clearance: 35.6 mL/min (A) (by C-G formula based on SCr of 2.24 mg/dL (H)). '[]'$      On intermittent HD, scheduled: '[]'$      On CRRT    Antimicrobial dosage has been changed to:  cefepime 2 g q 12h  Additional comments:   Thank you for allowing pharmacy to be a part of this patient's care.  Napoleon Form, [facility] 10/21/2020 7:43 AM

## 2020-10-21 NOTE — Progress Notes (Signed)
PROGRESS NOTE    Mark Chang  [id number] DOB: [birth year] DOA: 10/19/2020 PCP: Terrill Mohr, NP   Brief Narrative:  This 75 years old male with PMH significant for DM2, HTN, intellectual disability, CKD stage IIIa presented in the ED with c/o: abdominal pain, diarrhea and hypotension.  As per skilled nursing staff patient reports having abdominal pain for the last couple of weeks.  He was found to have a blood pressure of 68/40, temp 100.5, HR 105, blood pressure improved with fluids.  Lactic acid 2.2, WBC 22.7.  CT abdomen shows minimal amount of free air,  cecal mass probably sigmoid colon.  General surgery and GI is consulted.  GI recommended no endoscopic procedure needed at this time.  Patient might require surgery (subtotal colectomy with ileostomy) continue conservative management now until general surgery reach out to the family to get consent. Patient complained of chest pain. EKG shows new onset A. fib with RVR with hypotension.  Cardiology consulted, patient started on amiodarone drip.  Heart rate now controlled. Hold off on anticoagulation.  Assessment & Plan:   Principal Problem:   Perforated abdominal viscus Active Problems:   Intellectual disability   AKI (acute kidney injury) ([place])   Severe sepsis with acute organ dysfunction (HCC)   DM (diabetes mellitus), type 2 with renal complications (HCC)   CKD (chronic kidney disease), stage III (HCC)   Mass of cecum   Pneumatosis coli  Septic shock in the setting of perforated abdominal viscus: Patient presented with abdominal pain for several weeks, found to have cecal mass and pneumatosis on CT A/P. He was found to have tachycardia, tachypnea, hypotension, lactic acidosis and source. Patient has received IV hydration, continue IV antibiotics (cefepime and Flagyl). General surgery and GI is consulted.   Continue bowel rest, and IV hydration Continue to monitor clinical status.  Sepsis physiology improving.   Sigmoid  Mass:  General surgery recommended  sigmoid colectomy with end colostomy for treatment of the sigmoid mass and evaluation of the viability of the cecum with possible resection. Recommended no EGD intervention needed at this point.   Paroxysmal atrial fibrillation with RVR: Patient has developed mild chest pain , EKG showed A. fib with RVR with hypotension. Patient was started on amiodarone drip.  Converted into sinus rhythm.  Cardiology consulted, heart rate now controlled Patient has CHADS2 score of 4, needs anticoagulation. Will hold off anticoagulation for now until surgery decided.   AKI on CKD stage IIIa: Serum creatinine at baseline 1.2-1.6 presented with serum creatinine of 3.0 , which is improving with IV hydration.  Continue gentle IV hydration, avoid nephrotoxic medications.  Hypertension: Hold blood pressure medications.  patient is having soft blood pressure.  Diabetes mellitus type 2 Continue sliding scale, obtain hemoglobin A1c.  Hypokalemia: Replaced, continue to monitor  DVT prophylaxis: SCDs Code Status: Full code. Family Communication: No family at bed side. Disposition Plan:   Status is: Inpatient  Remains inpatient appropriate because:Inpatient level of care appropriate due to severity of illness  Dispo: The patient is from: SNF              Anticipated d/c is to: SNF              Patient currently is not medically stable to d/c.   Difficult to place patient No   Consultants:  Cardiology GI General surgery  Procedures:  Antimicrobials:   Anti-infectives (From admission, onward)    Start     Dose/Rate Route Frequency Ordered Stop  10/21/20 1000  ceFEPIme (MAXIPIME) 2 g in sodium chloride 0.9 % 100 mL IVPB        2 g 200 mL/hr over 30 Minutes Intravenous Every 12 hours 10/21/20 0742     10/21/20 0600  metroNIDAZOLE (FLAGYL) IVPB 500 mg        500 mg 100 mL/hr over 60 Minutes Intravenous Every 8 hours 10/20/20 0028     10/20/20 2200  ceFEPIme  (MAXIPIME) 2 g in sodium chloride 0.9 % 100 mL IVPB  Status:  Discontinued        2 g 200 mL/hr over 30 Minutes Intravenous Every 24 hours 10/20/20 0048 10/21/20 0742   10/19/20 2030  ceFEPIme (MAXIPIME) 2 g in sodium chloride 0.9 % 100 mL IVPB        2 g 200 mL/hr over 30 Minutes Intravenous  Once 10/19/20 2029 10/20/20 0033   10/19/20 2030  metroNIDAZOLE (FLAGYL) IVPB 500 mg        500 mg 100 mL/hr over 60 Minutes Intravenous  Once 10/19/20 2029 10/19/20 2210       Subjective: Patient was seen and examined at bedside.  Overnight events noted.   Patient reports abdominal pain is improving. He has loose watery stools.  His heart rate is now controlled and in sinus rhythm.  Objective: Vitals:   10/21/20 0900 10/21/20 1000 10/21/20 1100 10/21/20 1200  BP: (!) 130/44 (!) 129/59 (!) 144/51 (!) 148/53  Pulse: 83 86 83 83  Resp: (!) 26 (!) 32 (!) 21 (!) 21  Temp:    97.6 F (36.4 C)  TempSrc:    Oral  SpO2: 100% 97% 97% 99%  Weight:      Height:        Intake/Output Summary (Last 24 hours) at 10/21/2020 1321 Last data filed at 10/21/2020 1245 Gross per 24 hour  Intake 5300.13 ml  Output 1850 ml  Net 3450.13 ml   Filed Weights   10/19/20 2019  Weight: 122 kg    Examination:  General exam: Appears calm and comfortable , not in any distress, deconditioned Respiratory system: Clear to auscultation. Respiratory effort normal. Cardiovascular system: S1 & S2 heard, RRR. No JVD, murmurs, rubs, gallops or clicks. No pedal edema. Gastrointestinal system: Abdomen is distended, soft and  mildly tender. No organomegaly or masses felt.  Sluggish bowel sounds.   Central nervous system: Alert and oriented x 2 . No focal neurological deficits. Extremities: Symmetric 5 x 5 power.  No edema, no cyanosis, no clubbing. Skin: No rashes, lesions or ulcers Psychiatry: Judgement and insight appear normal. Mood & affect appropriate.     Data Reviewed: I have personally reviewed following labs  and imaging studies  CBC: Recent Labs  Lab 10/19/20 2029 10/20/20 0355 10/21/20 0245  WBC 22.7* 20.0* 17.6*  NEUTROABS 18.6*  --   --   HGB 11.5* 10.9* 11.3*  HCT 36.0* 33.4* 34.5*  MCV 89.8 88.4 88.2  PLT 384 344 0000000   Basic Metabolic Panel: Recent Labs  Lab 10/19/20 2029 10/20/20 0355 10/21/20 0245  NA 134* 134* 139  K 4.3 3.4* 2.6*  CL 102 106 112*  CO2 16* 15* 16*  GLUCOSE 182* 147* 67*  BUN 127* 110* 91*  CREATININE 3.92* 3.28* 2.24*  CALCIUM 8.3* 8.1* 8.1*  MG  --   --  2.6*  PHOS  --   --  3.8   GFR: Estimated Creatinine Clearance: 35.6 mL/min (A) (by C-G formula based on SCr of 2.24 mg/dL (H)).  Liver Function Tests: Recent Labs  Lab 10/19/20 2029 10/20/20 0355 10/21/20 0245  AST 33 29 54*  ALT '26 25 30  '$ ALKPHOS 50 49 53  BILITOT 0.6 0.5 0.5  PROT 6.9 6.2* 6.1*  ALBUMIN 2.8* 2.7* 2.4*   No results for input(s): LIPASE, AMYLASE in the last 168 hours. No results for input(s): AMMONIA in the last 168 hours. Coagulation Profile: Recent Labs  Lab 10/19/20 2029 10/20/20 0355  INR 1.3* 1.2   Cardiac Enzymes: No results for input(s): CKTOTAL, CKMB, CKMBINDEX, TROPONINI in the last 168 hours. BNP (last 3 results) No results for input(s): PROBNP in the last 8760 hours. HbA1C: Recent Labs    10/19/20 2029  HGBA1C 6.5*   CBG: Recent Labs  Lab 10/20/20 2323 10/21/20 0151 10/21/20 0353 10/21/20 0734 10/21/20 1130  GLUCAP 74 77 70 95 83   Lipid Profile: No results for input(s): CHOL, HDL, LDLCALC, TRIG, CHOLHDL, LDLDIRECT in the last 72 hours. Thyroid Function Tests: No results for input(s): TSH, T4TOTAL, FREET4, T3FREE, THYROIDAB in the last 72 hours. Anemia Panel: No results for input(s): VITAMINB12, FOLATE, FERRITIN, TIBC, IRON, RETICCTPCT in the last 72 hours. Sepsis Labs: Recent Labs  Lab 10/19/20 2029 10/19/20 2255 10/20/20 0027 10/20/20 0327 10/20/20 0355  PROCALCITON  --   --   --   --  6.58  LATICACIDVEN 2.2* 1.5 1.3 0.9  --      Recent Results (from the past 240 hour(s))  Resp Panel by RT-PCR (Flu A&B, Covid) Nasopharyngeal Swab     Status: None   Collection Time: 10/19/20  8:29 PM   Specimen: Nasopharyngeal Swab; Nasopharyngeal(NP) swabs in vial transport medium  Result Value Ref Range Status   SARS Coronavirus 2 by RT PCR NEGATIVE NEGATIVE Final    Comment: (NOTE) SARS-CoV-2 target nucleic acids are NOT DETECTED.  The SARS-CoV-2 RNA is generally detectable in upper respiratory specimens during the acute phase of infection. The lowest concentration of SARS-CoV-2 viral copies this assay can detect is 138 copies/mL. A negative result does not preclude SARS-Cov-2 infection and should not be used as the sole basis for treatment or other patient management decisions. A negative result may occur with  improper specimen collection/handling, submission of specimen other than nasopharyngeal swab, presence of viral mutation(s) within the areas targeted by this assay, and inadequate number of viral copies(<138 copies/mL). A negative result must be combined with clinical observations, patient history, and epidemiological information. The expected result is Negative.  Fact Sheet for Patients:  EntrepreneurPulse.com.au  Fact Sheet for Healthcare Providers:  IncredibleEmployment.be  This test is no t yet approved or cleared by the [place] FDA and  has been authorized for detection and/or diagnosis of SARS-CoV-2 by FDA under an Emergency Use Authorization (EUA). This EUA will remain  in effect (meaning this test can be used) for the duration of the COVID-19 declaration under Section 564(b)(1) of the Act, 21 U.S.C.section 360bbb-3(b)(1), unless the authorization is terminated  or revoked sooner.       Influenza A by PCR NEGATIVE NEGATIVE Final   Influenza B by PCR NEGATIVE NEGATIVE Final    Comment: (NOTE) The Xpert Xpress SARS-CoV-2/FLU/RSV plus assay is intended as an  aid in the diagnosis of influenza from Nasopharyngeal swab specimens and should not be used as a sole basis for treatment. Nasal washings and aspirates are unacceptable for Xpert Xpress SARS-CoV-2/FLU/RSV testing.  Fact Sheet for Patients: EntrepreneurPulse.com.au  Fact Sheet for Healthcare Providers: IncredibleEmployment.be  This test is not yet approved  or cleared by the [place] and has been authorized for detection and/or diagnosis of SARS-CoV-2 by FDA under an Emergency Use Authorization (EUA). This EUA will remain in effect (meaning this test can be used) for the duration of the COVID-19 declaration under Section 564(b)(1) of the Act, 21 U.S.C. section 360bbb-3(b)(1), unless the authorization is terminated or revoked.  Performed at [facility], [place] [address] [place], [place] [address]   Blood Culture (routine x 2)     Status: None (Preliminary result)   Collection Time: 10/19/20  8:29 PM   Specimen: BLOOD  Result Value Ref Range Status   Specimen Description   Final    BLOOD BLOOD LEFT ARM Performed at [facility] [address], [place], [place] [address]    Special Requests   Final    BOTTLES DRAWN AEROBIC AND ANAEROBIC Blood Culture adequate volume Performed at [facility] [address], [place], [place] [address]    Culture   Final    NO GROWTH 2 DAYS Performed at [facility] [address] [place], [place] [address]    Report Status PENDING  Incomplete  Urine Culture     Status: None   Collection Time: 10/19/20  8:29 PM   Specimen: In/Out Cath Urine  Result Value Ref Range Status   Specimen Description   Final    IN/OUT CATH URINE Performed at [facility] [address], [place], [place] [address]    Special Requests   Final    NONE Performed at [facility], [place] [address] [place], [place]  [address]    Culture   Final    NO GROWTH Performed at [facility], [place] [address], [place], [place] [address]    Report Status 10/21/2020 FINAL  Final  Blood Culture (routine x 2)     Status: None (Preliminary result)   Collection Time: 10/20/20  4:00 AM   Specimen: BLOOD  Result Value Ref Range Status   Specimen Description   Final    BLOOD BLOOD RIGHT ARM Performed at [facility] [address], [place], [place] [address]    Special Requests   Final    BOTTLES DRAWN AEROBIC AND ANAEROBIC Blood Culture adequate volume Performed at [facility] [address] [place], [place] [address]    Culture   Final    NO GROWTH 1 DAY Performed at [facility], [place] [address] [place], [place] [address]    Report Status PENDING  Incomplete  MRSA Next Gen by PCR, Nasal     Status: None   Collection Time: 10/20/20  7:35 AM   Specimen: Nasal Mucosa; Nasal Swab  Result Value Ref Range Status   MRSA by PCR Next Gen NOT DETECTED NOT DETECTED Final    Comment: (NOTE) The GeneXpert MRSA Assay (FDA approved for NASAL specimens only), is one component of a comprehensive MRSA colonization surveillance program. It is not intended to diagnose MRSA infection nor to guide or monitor treatment for MRSA infections. Test performance is not FDA approved in patients less than 76 years old. Performed at [facility], [place] [address], [place], [place] [address]   C Difficile Quick Screen w PCR reflex     Status: None   Collection Time: 10/20/20  4:58 PM   Specimen: STOOL  Result Value Ref Range Status   C Diff antigen NEGATIVE NEGATIVE Final  C Diff toxin NEGATIVE NEGATIVE Final   C Diff interpretation No C. difficile detected.  Final    Comment: Performed at [facility], [place] [address] [place], [place] [address]    Radiology Studies: CT ABDOMEN PELVIS WO CONTRAST  Result Date: 10/19/2020 CLINICAL DATA:   Abdominal pain for 2 weeks. Diarrhea for a few days. EXAM: CT ABDOMEN AND PELVIS WITHOUT CONTRAST TECHNIQUE: Multidetector CT imaging of the abdomen and pelvis was performed following the standard protocol without IV contrast. COMPARISON:  None. FINDINGS: Lower chest: Lung bases show bronchial wall thickening and minor subsegmental atelectasis. Heart borderline enlarged. Three-vessel coronary artery calcifications. Hepatobiliary: Liver diffusely low in attenuation. There is central volume loss with relative enlargement of the caudate lobe and lateral segment of the left lobe. No liver mass. Gallbladder is collapsed around multiple gallstones. No evidence of acute cholecystitis. No bile duct dilation. Pancreas: Unremarkable. No pancreatic ductal dilatation or surrounding inflammatory changes. Spleen: Normal in size without focal abnormality. Adrenals/Urinary Tract: 9 mm right adrenal angiomyolipoma. Adrenal glands otherwise unremarkable. Bilateral renal cortical thinning and bilateral low-attenuation renal masses consistent with cysts. No stones. No hydronephrosis. Normal ureters. Bladder decompressed with a Foley catheter Stomach/Bowel: There is pneumatosis intestinalis involving the cecum and ascending colon. The colon is dilated, most prominently the cecum and ascending colon, maximum diameter 10 cm.: There is a circumferential area of wall thickening and colonic narrowing of the mid sigmoid colon concerning for a constricting mass lesion. This measures approximately 4.7 cm in length. No other areas of colonic wall thickening. There is mild hazy inflammatory change adjacent to the distended cecum and ascending colon. There are small bubbles of extraluminal air in the anterior mid to lower abdomen. Stomach is decompressed, otherwise unremarkable. Small bowel is unremarkable. Vascular/Lymphatic: Minor aortic atherosclerosis. No aneurysm. No enlarged lymph nodes. Reproductive: Unremarkable. Other: Trace amount of  ascites tracking along the left pericolic gutter. Musculoskeletal: No fracture or acute finding.  No bone lesion. IMPRESSION: 1. 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 Colon is distended, most evident of the cecum and lower ascending colon, maximum 10 cm. 2. Apparent constricting mass lesion of the sigmoid colon, suspicious for carcinoma, which presumably is the cause of the colonic distension. Suspect the pneumatosis intestinalis is secondary. 3. Minimal amount of extraluminal air in the anterior mid to lower abdomen. 4. No other acute abnormality within the abdomen or pelvis. 5. Liver morphologic changes suggesting cirrhosis. No evidence of a liver mass. 6. Chronic findings include gallstones, mild aortic atherosclerosis, renal cortical thinning and renal cysts. Electronically Signed   By: Lajean Manes M.D.   On: 10/19/2020 23:25   DG Chest Port 1 View  Result Date: 10/19/2020 CLINICAL DATA:  Questionable sepsis - evaluate for abnormality EXAM: PORTABLE CHEST 1 VIEW COMPARISON:  03/20/2017 FINDINGS: The heart size and mediastinal contours are within normal limits. Both lungs are clear. The visualized skeletal structures are unremarkable. IMPRESSION: No active disease. Electronically Signed   By: Ulyses Jarred M.D.   On: 10/19/2020 20:58   DG Abd Portable 1V  Result Date: 10/21/2020 CLINICAL DATA:  75 year old male with abdominal pain, colonic obstruction EXAM: PORTABLE ABDOMEN - 1 VIEW COMPARISON:  CT scan of the abdomen and pelvis 10/19/2020 FINDINGS: Persistent gaseous distension of the colon. The largest diameter is present in the transverse colon which measures up to 11.1 cm in diameter. Multiple stones project over the right upper quadrant consistent with cholelithiasis. No large free  air on this single supine view. No acute osseous abnormality. IMPRESSION: Persistent gaseous distension of the colon, particularly the transverse  colon which measures up to 11.1 cm in diameter. Cholelithiasis. Electronically Signed   By: Jacqulynn Cadet M.D.   On: 10/21/2020 10:12     Scheduled Meds:  Chlorhexidine Gluconate Cloth  6 each Topical Daily   insulin aspart  0-9 Units Subcutaneous Q4H   insulin detemir  15 Units Subcutaneous BID   mouth rinse  15 mL Mouth Rinse BID   [START ON 10/22/2020] pneumococcal 23 valent vaccine  0.5 mL Intramuscular Tomorrow-1000   Continuous Infusions:  amiodarone 30 mg/hr (10/21/20 1245)   ceFEPime (MAXIPIME) IV Stopped (10/21/20 1118)   dextrose 5% lactated ringers 125 mL/hr at 10/21/20 1245   lactated ringers Stopped (10/21/20 0423)   metronidazole Stopped (10/21/20 1034)     LOS: 1 day    Time spent: 35 mins    Sinclair Alligood, MD [facility]   If 7PM-7AM, please contact night-coverage

## 2020-10-21 NOTE — Progress Notes (Signed)
Patient having multiple watery loose stools this shift. A rectal pouch was placed, and no leakage noted around seal.   Repeat potassium drawn for follow up of 2.6 this am. Results pending.   Patient remains on amio drip, converted to NSR 70-80's. Cardiology following.

## 2020-10-21 NOTE — Progress Notes (Signed)
[facility] Gastroenterology Progress Note  Mark Chang 75 y.o. [birth year]  CC:  Sigmoid mass   Subjective: Patient reports only mild abdominal pain today. Denies nausea/vomiting. Has had some loose stools.  Per RN, no melena/hematochezia.  ROS : Review of Systems  Cardiovascular:  Negative for chest pain and palpitations.  Gastrointestinal:  Positive for abdominal pain and diarrhea. Negative for blood in stool, constipation, heartburn, melena, nausea and vomiting.    Objective: Vital signs in last 24 hours: Vitals:   10/21/20 0700 10/21/20 0800  BP:  (!) 135/53  Pulse: 80 87  Resp: (!) 23 (!) 23  Temp:  (!) 97 F (36.1 C)  SpO2: 97% 97%    Physical Exam:  General:  Alert, cooperative, no distress  Head:  Normocephalic, without obvious abnormality, atraumatic  Eyes:  Anicteric sclera, EOMs intact,  Lungs:   Clear to auscultation bilaterally, respirations unlabored  Heart:  Regular rate and rhythm, S1, S2 normal  Abdomen:   Distended, mild lower abdominal tenderness to palpation, sluggish bowel sounds, no guarding or peritoneal signs  Extremities: Extremities normal, atraumatic, no  edema    Lab Results: Recent Labs    10/20/20 0355 10/21/20 0245  NA 134* 139  K 3.4* 2.6*  CL 106 112*  CO2 15* 16*  GLUCOSE 147* 67*  BUN 110* 91*  CREATININE 3.28* 2.24*  CALCIUM 8.1* 8.1*  MG  --  2.6*  PHOS  --  3.8   Recent Labs    10/20/20 0355 10/21/20 0245  AST 29 54*  ALT 25 30  ALKPHOS 49 53  BILITOT 0.5 0.5  PROT 6.2* 6.1*  ALBUMIN 2.7* 2.4*   Recent Labs    10/19/20 2029 10/20/20 0355 10/21/20 0245  WBC 22.7* 20.0* 17.6*  NEUTROABS 18.6*  --   --   HGB 11.5* 10.9* 11.3*  HCT 36.0* 33.4* 34.5*  MCV 89.8 88.4 88.2  PLT 384 344 369   Recent Labs    10/19/20 2029 10/20/20 0355  LABPROT 16.2* 15.2  INR 1.3* 1.2    Assessment: CT revealed apparent constricting mass lesion of the sigmoid colon, suspicious for carcinoma, which presumably is the cause of  the colonic distension.  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  Given sepsis, ischemia, and colonic pneumatosis, Dr. Paulita Fujita does not recommend proceeding with sigmoidoscopy at this time.  Continue supportive care.  [facility] will follow at a distance. Please recall sooner if needed.  Salley Slaughter PA-C 10/21/2020, 9:38 AM  Contact #  [phone]

## 2020-10-22 DIAGNOSIS — R198 Other specified symptoms and signs involving the digestive system and abdomen: Secondary | ICD-10-CM | POA: Diagnosis not present

## 2020-10-22 LAB — BASIC METABOLIC PANEL
Anion gap: 7 (ref 5–15)
BUN: 69 mg/dL — ABNORMAL HIGH (ref 8–23)
CO2: 17 mmol/L — ABNORMAL LOW (ref 22–32)
Calcium: 8.2 mg/dL — ABNORMAL LOW (ref 8.9–10.3)
Chloride: 122 mmol/L — ABNORMAL HIGH (ref 98–111)
Creatinine, Ser: 1.64 mg/dL — ABNORMAL HIGH (ref 0.61–1.24)
GFR, Estimated: 43 mL/min — ABNORMAL LOW (ref >60)
Glucose, Bld: 126 mg/dL — ABNORMAL HIGH (ref 70–99)
Potassium: 2.6 mmol/L — CL (ref 3.5–5.1)
Sodium: 146 mmol/L — ABNORMAL HIGH (ref 135–145)

## 2020-10-22 LAB — POTASSIUM: Potassium: 3.1 mmol/L — ABNORMAL LOW (ref 3.5–5.1)

## 2020-10-22 LAB — MAGNESIUM: Magnesium: 2.6 mg/dL — ABNORMAL HIGH (ref 1.7–2.4)

## 2020-10-22 LAB — GLUCOSE, CAPILLARY
Glucose-Capillary: 126 mg/dL — ABNORMAL HIGH (ref 70–99)
Glucose-Capillary: 151 mg/dL — ABNORMAL HIGH (ref 70–99)
Glucose-Capillary: 155 mg/dL — ABNORMAL HIGH (ref 70–99)
Glucose-Capillary: 156 mg/dL — ABNORMAL HIGH (ref 70–99)
Glucose-Capillary: 161 mg/dL — ABNORMAL HIGH (ref 70–99)
Glucose-Capillary: 166 mg/dL — ABNORMAL HIGH (ref 70–99)

## 2020-10-22 LAB — CBC
HCT: 37.3 % — ABNORMAL LOW (ref 39.0–52.0)
Hemoglobin: 11.7 g/dL — ABNORMAL LOW (ref 13.0–17.0)
MCH: 28.3 pg (ref 26.0–34.0)
MCHC: 31.4 g/dL (ref 30.0–36.0)
MCV: 90.3 fL (ref 80.0–100.0)
Platelets: 375 10*3/uL (ref 150–400)
RBC: 4.13 MIL/uL — ABNORMAL LOW (ref 4.22–5.81)
RDW: 14.4 % (ref 11.5–15.5)
WBC: 15.4 10*3/uL — ABNORMAL HIGH (ref 4.0–10.5)
nRBC: 0 % (ref 0.0–0.2)

## 2020-10-22 LAB — PHOSPHORUS: Phosphorus: 2.5 mg/dL (ref 2.5–4.6)

## 2020-10-22 MED ORDER — POTASSIUM CHLORIDE CRYS ER 20 MEQ PO TBCR
40.0000 meq | EXTENDED_RELEASE_TABLET | Freq: Once | ORAL | Status: AC
  Administered 2020-10-22: 40 meq via ORAL
  Filled 2020-10-22: qty 2

## 2020-10-22 MED ORDER — AMIODARONE HCL 200 MG PO TABS: 200.0000 mg | ORAL_TABLET | Freq: Two times a day (BID) | ORAL | Status: DC

## 2020-10-22 MED ORDER — AMIODARONE HCL 200 MG PO TABS
200.0000 mg | ORAL_TABLET | Freq: Two times a day (BID) | ORAL | Status: DC
  Administered 2020-10-22 – 2020-10-24 (×4): 200 mg via ORAL
  Filled 2020-10-22 (×5): qty 1

## 2020-10-22 MED ORDER — POTASSIUM CHLORIDE 10 MEQ/100ML IV SOLN
10.0000 meq | INTRAVENOUS | Status: AC
  Administered 2020-10-22 (×6): 10 meq via INTRAVENOUS
  Filled 2020-10-22 (×6): qty 100

## 2020-10-22 MED ORDER — ACETAMINOPHEN 10 MG/ML IV SOLN
1000.0000 mg | Freq: Four times a day (QID) | INTRAVENOUS | Status: AC
  Administered 2020-10-22 – 2020-10-23 (×4): 1000 mg via INTRAVENOUS
  Filled 2020-10-22 (×4): qty 100

## 2020-10-22 NOTE — Progress Notes (Addendum)
Progress Note  Patient Name: Mark Chang Date of Encounter: 10/22/2020  Primary Cardiologist: New to [place] to have c/o abdominal pain 10/10 in severity this AM. SOB. States that he "cannot hold off much longer".   Inpatient Medications    Scheduled Meds:  chlorhexidine  15 mL Mouth Rinse BID   Chlorhexidine Gluconate Cloth  6 each Topical Daily   insulin aspart  0-9 Units Subcutaneous Q4H   insulin detemir  15 Units Subcutaneous BID   mouth rinse  15 mL Mouth Rinse BID   pneumococcal 23 valent vaccine  0.5 mL Intramuscular Tomorrow-1000   Continuous Infusions:  amiodarone 30 mg/hr (10/22/20 0400)   ceFEPime (MAXIPIME) IV Stopped (10/21/20 2158)   dextrose 5% lactated ringers 125 mL/hr at 10/22/20 0400   lactated ringers Stopped (10/21/20 0423)   metronidazole Stopped (10/22/20 0115)   potassium chloride 10 mEq (10/22/20 0705)   potassium chloride     PRN Meds: acetaminophen **OR** acetaminophen, HYDROmorphone (DILAUDID) injection, ondansetron **OR** ondansetron (ZOFRAN) IV   Vital Signs    Vitals:   10/22/20 0300 10/22/20 0400 10/22/20 0500 10/22/20 0700  BP: (!) 159/48 (!) 173/56  (!) 153/33  Pulse: 74 71  72  Resp: (!) 25 (!) 22  16  Temp:   98.9 F (37.2 C)   TempSrc:   Axillary   SpO2: 96% 97%  98%  Weight:      Height:        Intake/Output Summary (Last 24 hours) at 10/22/2020 0732 Last data filed at 10/22/2020 0400 Gross per 24 hour  Intake 4068.96 ml  Output 3800 ml  Net 268.96 ml   Filed Weights   10/19/20 2019  Weight: 122 kg    Physical Exam   General: Elderly, mild distress with abdominal pain  Neck: Negative for carotid bruits. No JVD Lungs:Clear to ausculation bilaterally. No wheezes. Breathing is unlabored. Cardiovascular: RRR with S1 S2. No murmurs Abdomen: Soft, non-tender, non-distended. No obvious abdominal masses. Extremities: No edema. Neuro: Alert and oriented. No focal deficits. No facial  asymmetry. MAE spontaneously. Psych: Responds to questions appropriately with normal affect.    Labs    Chemistry Recent Labs  Lab 10/19/20 2029 10/20/20 0355 10/21/20 0245 10/21/20 2153 10/22/20 0248  NA 134* 134* 139  --  146*  K 4.3 3.4* 2.6* 2.5* 2.6*  CL 102 106 112*  --  122*  CO2 16* 15* 16*  --  17*  GLUCOSE 182* 147* 67*  --  126*  BUN 127* 110* 91*  --  69*  CREATININE 3.92* 3.28* 2.24*  --  1.64*  CALCIUM 8.3* 8.1* 8.1*  --  8.2*  PROT 6.9 6.2* 6.1*  --   --   ALBUMIN 2.8* 2.7* 2.4*  --   --   AST 33 29 54*  --   --   ALT '26 25 30  '$ --   --   ALKPHOS 50 49 53  --   --   BILITOT 0.6 0.5 0.5  --   --   GFRNONAA 15* 19* 30*  --  43*  ANIONGAP 16* 13 11  --  7     Hematology Recent Labs  Lab 10/20/20 0355 10/21/20 0245 10/22/20 0248  WBC 20.0* 17.6* 15.4*  RBC 3.78* 3.91* 4.13*  HGB 10.9* 11.3* 11.7*  HCT 33.4* 34.5* 37.3*  MCV 88.4 88.2 90.3  MCH 28.8 28.9 28.3  MCHC 32.6 32.8 31.4  RDW 14.2  14.4 14.4  PLT 344 369 375    Cardiac EnzymesNo results for input(s): TROPONINI in the last 168 hours. No results for input(s): TROPIPOC in the last 168 hours.   BNPNo results for input(s): BNP, PROBNP in the last 168 hours.   DDimer No results for input(s): DDIMER in the last 168 hours.   Radiology    DG Abd Portable 1V  Result Date: 10/21/2020 CLINICAL DATA:  75 year old male with abdominal pain, colonic obstruction EXAM: PORTABLE ABDOMEN - 1 VIEW COMPARISON:  CT scan of the abdomen and pelvis 10/19/2020 FINDINGS: Persistent gaseous distension of the colon. The largest diameter is present in the transverse colon which measures up to 11.1 cm in diameter. Multiple stones project over the right upper quadrant consistent with cholelithiasis. No large free air on this single supine view. No acute osseous abnormality. IMPRESSION: Persistent gaseous distension of the colon, particularly the transverse colon which measures up to 11.1 cm in diameter.  Cholelithiasis. Electronically Signed   By: Jacqulynn Cadet M.D.   On: 10/21/2020 10:12   ECHOCARDIOGRAM COMPLETE  Result Date: 10/21/2020    ECHOCARDIOGRAM REPORT   Patient Name:   Mark Chang Date of Exam: 10/21/2020 Medical Rec #:  [id number]     Height:       68.0 in Accession #:    [id number]    Weight:       269.0 lb Date of Birth:  1945/08/23     BSA:          2.318 m Patient Age:    75 years      BP:           135/53 mmHg Patient Gender: M             HR:           84 bpm. Exam Location:  Inpatient Procedure: 2D Echo, Cardiac Doppler, Color Doppler and Intracardiac            Opacification Agent Indications:     Atrial Fibrillation I48.91  History:         Patient has prior history of Echocardiogram examinations, most                  recent 03/23/2017. COPD; Risk Factors:Hypertension, Diabetes,                  Dyslipidemia and Non-Smoker. CKD.  Sonographer:     Vickie Epley RDCS Referring Phys:  Paul Smiths Diagnosing Phys: Loralie Champagne MD IMPRESSIONS  1. Left ventricular ejection fraction, by estimation, is 45 to 50%. The left ventricle has mildly decreased function. The left ventricle demonstrates global hypokinesis. There is mild left ventricular hypertrophy. Left ventricular diastolic parameters are consistent with Grade I diastolic dysfunction (impaired relaxation).  2. Right ventricular systolic function is normal. The right ventricular size is not well visualized. Tricuspid regurgitation signal is inadequate for assessing PA pressure.  3. The mitral valve is normal in structure. No evidence of mitral valve regurgitation. No evidence of mitral stenosis.  4. The aortic valve is tricuspid. Aortic valve regurgitation is not visualized. Mild aortic valve sclerosis is present, with no evidence of aortic valve stenosis.  5. The inferior vena cava is dilated in size with <50% respiratory variability, suggesting right atrial pressure of 15 mmHg.  6. Technically difficult study with poor  acoustic windows. FINDINGS  Left Ventricle: Left ventricular ejection fraction, by estimation, is 45 to 50%. The left ventricle has  mildly decreased function. The left ventricle demonstrates global hypokinesis. Definity contrast agent was given IV to delineate the left ventricular  endocardial borders. The left ventricular internal cavity size was normal in size. There is mild left ventricular hypertrophy. Left ventricular diastolic parameters are consistent with Grade I diastolic dysfunction (impaired relaxation). Right Ventricle: The right ventricular size is not well visualized. Right vetricular wall thickness was not well visualized. Right ventricular systolic function is normal. Tricuspid regurgitation signal is inadequate for assessing PA pressure. Left Atrium: Left atrial size was normal in size. Right Atrium: Right atrial size was normal in size. Pericardium: There is no evidence of pericardial effusion. Mitral Valve: The mitral valve is normal in structure. There is mild calcification of the mitral valve leaflet(s). Mild mitral annular calcification. No evidence of mitral valve regurgitation. No evidence of mitral valve stenosis. Tricuspid Valve: The tricuspid valve is normal in structure. Tricuspid valve regurgitation is not demonstrated. Aortic Valve: The aortic valve is tricuspid. Aortic valve regurgitation is not visualized. Mild aortic valve sclerosis is present, with no evidence of aortic valve stenosis. Pulmonic Valve: The pulmonic valve was not well visualized. Pulmonic valve regurgitation is not visualized. Aorta: The aortic root is normal in size and structure. Venous: The inferior vena cava is dilated in size with less than 50% respiratory variability, suggesting right atrial pressure of 15 mmHg. IAS/Shunts: No atrial level shunt detected by color flow Doppler.  LEFT VENTRICLE PLAX 2D LVIDd:         5.20 cm      Diastology LVIDs:         3.80 cm      LV e' medial:    5.55 cm/s LV PW:         0.90  cm      LV E/e' medial:  13.3 LV IVS:        0.90 cm      LV e' lateral:   8.92 cm/s LVOT diam:     2.10 cm      LV E/e' lateral: 8.3 LV SV:         51 LV SV Index:   22 LVOT Area:     3.46 cm  LV Volumes (MOD) LV vol d, MOD A2C: 130.0 ml LV vol d, MOD A4C: 130.0 ml LV vol s, MOD A2C: 63.8 ml LV vol s, MOD A4C: 64.2 ml LV SV MOD A2C:     66.2 ml LV SV MOD A4C:     130.0 ml LV SV MOD BP:      72.6 ml RIGHT VENTRICLE RV S prime:     12.80 cm/s TAPSE (M-mode): 1.7 cm LEFT ATRIUM             Index       RIGHT ATRIUM           Index LA diam:        4.00 cm 1.73 cm/m  RA Area:     13.40 cm LA Vol (A2C):   39.7 ml 17.13 ml/m RA Volume:   31.60 ml  13.63 ml/m LA Vol (A4C):   33.3 ml 14.37 ml/m LA Biplane Vol: 36.9 ml 15.92 ml/m  AORTIC VALVE LVOT Vmax:   84.20 cm/s LVOT Vmean:  53.600 cm/s LVOT VTI:    0.146 m  AORTA Ao Root diam: 3.50 cm Ao Asc diam:  3.60 cm MITRAL VALVE MV Area (PHT): 3.81 cm    SHUNTS MV Decel Time: 199 msec    Systemic VTI:  0.15 m MV E velocity: 73.70 cm/s  Systemic Diam: 2.10 cm MV A velocity: 81.80 cm/s MV E/A ratio:  0.90 Loralie Champagne MD Electronically signed by Loralie Champagne MD Signature Date/Time: 10/21/2020/4:15:42 PM    Final (Updated)     Telemetry    10/22/20 NSR with stable rates  - Personally Reviewed  ECG    No new tracing as of 10/22/20 - Personally Reviewed  Cardiac Studies   Echocardiogram 02/2017:    Study Conclusions   - Left ventricle: The cavity size was moderately dilated. There was    mild concentric hypertrophy. Systolic function was mildly    reduced. The estimated ejection fraction was in the range of 45%    to 50%. Wall motion was normal; there were no regional wall    motion abnormalities. Features are consistent with a pseudonormal    left ventricular filling pattern, with concomitant abnormal    relaxation and increased filling pressure (grade 2 diastolic    dysfunction). Doppler parameters are consistent with high    ventricular filling  pressure.  - Left atrium: The atrium was mildly dilated.   Patient Profile     75 y.o. male with a hx of HTN, DM2, CKD stage IIIa and intellectual disability who is being seen today for the evaluation of new onset atrial fibrillation with RVR at the request of Dr. Dwyane Dee.  Assessment & Plan    1.  New onset atrial fibrillation: -Patient presented with a several week hx of abdominal pain found to be in septic shock on ED presentation>>>treated with IV fluids and empiric antibiotics. Abdominal CT scan as below.  Unfortunately, on 10/20/2020 patient complained of mild chest pain found to be in new onset atrial fibrillation with RVR with rates in the 140s.  Due to persistent hypotension, he was placed on IV amiodarone.  He has since converted to normal sinus rhythm with PVCs with no recurrence of chest pain.   -Elevated CHA2DS2-VASc score of 4 however urgent surgery is requested by surgical team therefore we will be holding off on [facility] for now. Once surgery performed and safe per surgical team, we can discuss the addition of Eliquis. -Continue IV amiodarone for now with plans to transition to oral dosing, likely tomorrow  -Maintaining NSR with stable rates    2.  Septic shock with concern for perforated abdominal viscus: -Patient presented with a several week history of abdominal pain found to have cecal mass and pneumatosis coli per CT imaging.  General surgery along with GI have been consulted.  Given evidence of sepsis with hypotension, leukocytosis and tachycardia patient was treated with broad-spectrum antibiotic therapy.  In review of surgery note from this morning, appears that they would wish to proceed with urgent colectomy. This has been somewhat complicated due to poor communication from the patients POA despite several attempts -Appears more SOB today, more pain  -Management per primary team, surgery   3.  AKI: -Creatinine on presentation greater than 3.0>>>improving with  resuscitation>>>1.64 today  -Baseline appears to be in the 1.2-1.6 range per lab work from 2018 -Continue to monitor with daily BMET   4.  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  Echocardiogram performed this admission with LVEF at 45-50% with global hypokinesis, G1DD and no valvular disease. He has been treated with home lisinopril 40 mg daily and Lasix 40 mg daily -He does not appear to be fluid volume overloaded on exam however would recommend obtaining a  repeat echocardiogram this admission -Once AKI and hypotension, restart lisinopril   5.  Hypotension>>>now hypertensive: -Appears to be somewhat resolved per vitals today with BP at 135/53 -On home antihypertensives>> held due to renal dysfunction  -Consider the addition of beta blocker at this time    6.  DM2: -Last hemoglobin A1c, 9.9 from 02/2017 -SSI while inpatient  7. HTN: -Stable, [id number] -Could certainly be pain related given abdominal pain score at 10/10 this AM -He is receiving pain medication now>> reassess -Consider the addition of beta blocker id no improvement after meds     Signed, Kathyrn Drown NP-C [place] Pager: [phone] 10/22/2020, 7:32 AM     For questions or updates, please contact   Please consult [email or web address] for contact info under Cardiology/STEMI.  Personally seen and examined. Agree with above.  Pain is currently being controlled, sleeping.  Earlier this morning severe pain.  Abdominal.  On exam comfortable, breathing comfortably.  Telemetry personally reviewed shows sinus rhythm.  No further evidence of atrial fibrillation.  Assessment and plan:  75 year old with septic shock concern for perforated abdominal viscus with new onset atrial fibrillation.  Paroxysmal atrial fibrillation - Currently controlled on IV amiodarone. -Maintaining sinus rhythm. -Change to p.o. when able, possibly  tomorrow  Cardiomyopathy - Mildly reduced ejection fraction of 45 to 50%.  This is from echocardiogram a few years ago.  Holding on lisinopril and Lasix. -Would not be unreasonable to repeat echocardiogram during this admission.  Sepsis - Marked improvement, on IV antibiotics.  Hypertensive today.  Pain may be playing a role.  Hypokalemia - Continues to be low.  Replete per primary team.  Candee Furbish, MD

## 2020-10-22 NOTE — Progress Notes (Signed)
CC: Abdominal pain Subjective: Alert, answering questions.  He does not know how to get a hold of his brother.  Complaining to nursing staff pain.  Objective: Vital signs in last 24 hours: Temp:  [97.5 F (36.4 C)-98.9 F (37.2 C)] 98.2 F (36.8 C) (07/26 0800) Pulse Rate:  [67-86] 72 (07/26 0700) Resp:  [16-32] 16 (07/26 0700) BP: (129-173)/(32-102) 153/33 (07/26 0700) SpO2:  [96 %-100 %] 98 % (07/26 0700) Last BM Date: 10/21/20 4069 IV 3000 urine 800 stool Afebrile.  Blood pressure, last BP 167/75 NA 146 Potassium 2.6 Creatinine 1.61 Magnesium 2.6 WBC 22.7>> 20.0>> 17.6>> 15.4 Plain film yesterday: showed persistent gaseous distention of the colon particularly in the transverse colon which measures up to 11 cm in diameter.   Intake/Output from previous day: 07/25 0701 - 07/26 0700 In: 4069 [I.V.:2793.1; IV Piggyback:1275.9] Out: 3800 [Urine:3000; Stool:800] Intake/Output this shift: Total I/O In: 904.3 [I.V.:584.2; IV Piggyback:320.1] Out: -   General appearance: alert, cooperative, and no distress Resp: clear anteriorly GI: distended and tender right lower quadrant, right upper quadrant and left upper quadrant.  He does not have peritonitis.  All these areas are mildly tender and he cannot really tell me if 1 area is more tender than another.  Lab Results:  Recent Labs    10/21/20 0245 10/22/20 0248  WBC 17.6* 15.4*  HGB 11.3* 11.7*  HCT 34.5* 37.3*  PLT 369 375    BMET Recent Labs    10/21/20 0245 10/21/20 2153 10/22/20 0248  NA 139  --  146*  K 2.6* 2.5* 2.6*  CL 112*  --  122*  CO2 16*  --  17*  GLUCOSE 67*  --  126*  BUN 91*  --  69*  CREATININE 2.24*  --  1.64*  CALCIUM 8.1*  --  8.2*   PT/INR Recent Labs    10/19/20 2029 10/20/20 0355  LABPROT 16.2* 15.2  INR 1.3* 1.2    Recent Labs  Lab 10/19/20 2029 10/20/20 0355 10/21/20 0245  AST 33 29 54*  ALT '26 25 30  '$ ALKPHOS 50 49 53  BILITOT 0.6 0.5 0.5  PROT 6.9 6.2* 6.1*   ALBUMIN 2.8* 2.7* 2.4*     Lipase  No results found for: LIPASE   Medications:  chlorhexidine  15 mL Mouth Rinse BID   Chlorhexidine Gluconate Cloth  6 each Topical Daily   insulin aspart  0-9 Units Subcutaneous Q4H   insulin detemir  15 Units Subcutaneous BID   mouth rinse  15 mL Mouth Rinse BID   pneumococcal 23 valent vaccine  0.5 mL Intramuscular Tomorrow-1000    amiodarone 30 mg/hr (10/22/20 0810)   ceFEPime (MAXIPIME) IV Stopped (10/21/20 2158)   dextrose 5% lactated ringers Stopped (10/22/20 0809)   lactated ringers Stopped (10/21/20 0423)   metronidazole 100 mL/hr at 10/22/20 0810   potassium chloride 10 mEq (10/22/20 0811)    Prior to Admission medications   Medication Sig Start Date End Date Taking? Authorizing Provider  acetaminophen (TYLENOL) 500 MG tablet Take 500 mg by mouth 3 (three) times daily.   Yes [provider]  atorvastatin (LIPITOR) 40 MG tablet Take 40 mg by mouth at bedtime. 07/03/19  Yes [provider]  dextrose (GLUTOSE) 40 % GEL Take 1 Tube by mouth once as needed for low blood sugar.   Yes [provider]  dicyclomine (BENTYL) 20 MG tablet Take 20 mg by mouth 2 (two) times daily. 10/17/20  Yes [provider]  docusate sodium (COLACE) 100 MG capsule Take 100 mg by mouth 2 (two) times daily.   Yes [provider]  escitalopram (LEXAPRO) 10 MG tablet Take 10 mg by mouth daily.   Yes [provider]  famotidine (PEPCID) 20 MG tablet Take 20 mg by mouth 2 (two) times daily. 06/12/19  Yes [provider]  Fenofibrate 50 MG CAPS Take 50 mg by mouth every evening. With food 09/27/20  Yes [provider]  furosemide (LASIX) 40 MG tablet Take 40 mg by mouth daily.   Yes [provider]  gabapentin (NEURONTIN) 100 MG capsule Take 100 mg by mouth 2 (two) times daily.  07/03/19  Yes [provider]  insulin aspart (NOVOLOG FLEXPEN) 100 UNIT/ML FlexPen Inject 8 Units into the skin 3  (three) times daily with meals.   Yes [provider]  Insulin Glargine (BASAGLAR KWIKPEN) 100 UNIT/ML Inject 40 Units into the skin every morning. 10/08/20  Yes [provider]  Insulin Glargine (BASAGLAR KWIKPEN) 100 UNIT/ML Inject 10 Units into the skin at bedtime.   Yes [provider]  lisinopril (PRINIVIL,ZESTRIL) 40 MG tablet Take 40 mg by mouth daily.    Yes [provider]  Melatonin 5 MG SUBL Take 10 mg by mouth at bedtime.   Yes [provider]  polyethylene glycol powder (GLYCOLAX/MIRALAX) 17 GM/SCOOP powder Take 17 g by mouth 2 (two) times daily as needed for mild constipation or moderate constipation. Mix with 8 oz of liquid   Yes [provider]  potassium chloride SA (K-DUR,KLOR-CON) 20 MEQ tablet Take 20 mEq by mouth daily.   Yes [provider]  TRULICITY 1.5 0000000 SOPN Inject 1.5 mg into the skin every Thursday. 06/13/19  Yes [provider]  vitamin B-12 (CYANOCOBALAMIN) 1000 MCG tablet Take 1,000 mcg by mouth daily.   Yes [provider]  insulin aspart (NOVOLOG) 100 UNIT/ML injection CBG 70 - 120: 0 units CBG 121 - 150: 2 units CBG 151 - 200: 3 units CBG 201 - 250: 5 units CBG 251 - 300: 8 units CBG 301 - 350: 11 units CBG 351 - 400: 15 units Patient not taking: No sig reported 07/23/16   Hosie Poisson, MD    amiodarone 30 mg/hr (10/22/20 0810)   ceFEPime (MAXIPIME) IV Stopped (10/21/20 2158)   dextrose 5% lactated ringers Stopped (10/22/20 0809)   lactated ringers Stopped (10/21/20 0423)   metronidazole 100 mL/hr at 10/22/20 0810   potassium chloride 10 mEq (10/22/20 0811)     Assessment/Plan   Sigmoid colon obstruction with pneumatosis  -Dr. Erlinda Hong opinion, GI service; is the that sigmoidoscopy is contraindicated in the setting of sepsis, ischemia, hypertension, pneumatosis and fever  -Unable to contact the brother; patient making slow progress  - Continue resuscitation and  antibiotics for now.     FEN: N.p.o./IV fluids ID: Cefepime/Flagyl 7/23 >> day 4 DVT: SCDs added; can have chemical DVT prophylaxis from our standpoint  Sepsis Chest pain, new onset atrial fibrillation with RVR  - CHA2DS2-VASc score:  4  will need  -Echo shows EF 45 to [id number], grade 2 diastolic dysfunction  - amiodarone drip Hx intellectual disability Type 2 diabetes  - A1C 9.9 CKD stage III  -Creatinine 3.92>> 3.2>> 2.24>> 1.61 Hypokalemia  - aim for K+ 4.0       LOS: 2 days    Shiree Altemus 10/22/2020 Please see Amion

## 2020-10-22 NOTE — Progress Notes (Signed)
PROGRESS NOTE    Mark Chang  [id number] DOB: May 22, 1945 DOA: 10/19/2020 PCP: Terrill Mohr, NP   Brief Narrative:  This 75 years old male with PMH significant for DM2, HTN, intellectual disability, CKD stage IIIa presented in the ED with c/o: abdominal pain, diarrhea and hypotension.  As per skilled nursing staff patient reports having abdominal pain for the last couple of weeks.  He was found to have a blood pressure of 68/40, temp 100.5, HR 105, blood pressure improved with fluids.  Lactic acid 2.2, WBC 22.7.  CT abdomen shows minimal amount of free air,  cecal mass probably sigmoid colon.  General surgery and GI is consulted.  GI recommended no endoscopic procedure needed at this time.  Patient requires surgery (subtotal colectomy with ileostomy) continue conservative management now until general surgery reach out to the family to get consent. Patient complained of chest pain. EKG shows new onset A. fib with RVR with hypotension.  Cardiology consulted, patient started on amiodarone drip.  Heart rate now controlled. Hold off on anticoagulation.  Assessment & Plan:   Principal Problem:   Perforated abdominal viscus Active Problems:   Intellectual disability   AKI (acute kidney injury) ([place])   Severe sepsis with acute organ dysfunction (HCC)   DM (diabetes mellitus), type 2 with renal complications (HCC)   CKD (chronic kidney disease), stage III (HCC)   Mass of cecum   Pneumatosis coli  Septic shock in the setting of perforated abdominal viscus: Patient presented with abdominal pain for several weeks, found to have cecal mass and pneumatosis on CT A/P. He was found to have tachycardia, tachypnea, hypotension, lactic acidosis and source. Patient has received IV hydration, continue IV antibiotics (cefepime and Flagyl). General surgery and GI is consulted.   Continue bowel rest, and IV hydration Continue to monitor clinical status.  Sepsis physiology improving. Patient continues to  improve conservatively. General surgery unable to reach brother. Surgery will proceed to the OR emergently if needed.   Sigmoid Mass:  General surgery recommended  sigmoid colectomy with end colostomy for treatment of the sigmoid mass and evaluation of the viability of the cecum with possible resection. GI recommended no EGD intervention needed at this point.   Paroxysmal atrial fibrillation with RVR: Patient has developed mild chest pain , EKG showed A. fib with RVR with hypotension. Patient was started on amiodarone drip.  Converted into sinus rhythm.  Cardiology consulted, heart rate now controlled Patient has CHADS2 score of 4, needs anticoagulation. Will hold off anticoagulation for now until surgery decided.   AKI on CKD stage IIIa: Serum creatinine at baseline 1.2-1.6 presented with serum creatinine of 3.0 , which is improving with IV hydration.  Continue gentle IV hydration, avoid nephrotoxic medications.  Hypertension: Hold blood pressure medications.  patient is having soft blood pressure.  Diabetes mellitus type 2 Continue sliding scale, obtain hemoglobin A1c.  Hypokalemia: Replaced, continue to monitor  DVT prophylaxis: SCDs Code Status: Full code. Family Communication: No family at bed side. Disposition Plan:   Status is: Inpatient  Remains inpatient appropriate because:Inpatient level of care appropriate due to severity of illness  Dispo: The patient is from: SNF              Anticipated d/c is to: SNF              Patient currently is not medically stable to d/c.   Difficult to place patient No   Consultants:  Cardiology GI General surgery  Procedures:  Antimicrobials:  Anti-infectives (From admission, onward)    Start     Dose/Rate Route Frequency Ordered Stop   10/21/20 1000  ceFEPIme (MAXIPIME) 2 g in sodium chloride 0.9 % 100 mL IVPB        2 g 200 mL/hr over 30 Minutes Intravenous Every 12 hours 10/21/20 0742     10/21/20 0600   metroNIDAZOLE (FLAGYL) IVPB 500 mg        500 mg 100 mL/hr over 60 Minutes Intravenous Every 8 hours 10/20/20 0028     10/20/20 2200  ceFEPIme (MAXIPIME) 2 g in sodium chloride 0.9 % 100 mL IVPB  Status:  Discontinued        2 g 200 mL/hr over 30 Minutes Intravenous Every 24 hours 10/20/20 0048 10/21/20 0742   10/19/20 2030  ceFEPIme (MAXIPIME) 2 g in sodium chloride 0.9 % 100 mL IVPB        2 g 200 mL/hr over 30 Minutes Intravenous  Once 10/19/20 2029 10/20/20 0033   10/19/20 2030  metroNIDAZOLE (FLAGYL) IVPB 500 mg        500 mg 100 mL/hr over 60 Minutes Intravenous  Once 10/19/20 2029 10/19/20 2210       Subjective: Patient was seen and examined at bedside.  Overnight events noted.   Patient reports abdominal pain is improving. He has loose watery stools.  Heart rate is now controlled and in sinus rhythm. Objective: Vitals:   10/22/20 0500 10/22/20 0700 10/22/20 0800 10/22/20 0900  BP:  (!) 153/33 (!) 167/75 (!) 171/59  Pulse:  72 74 80  Resp:  16 (!) 23 18  Temp: 98.9 F (37.2 C)  98.2 F (36.8 C)   TempSrc: Axillary  Axillary   SpO2:  98% 97% 95%  Weight:      Height:        Intake/Output Summary (Last 24 hours) at 10/22/2020 1118 Last data filed at 10/22/2020 1002 Gross per 24 hour  Intake 5312.33 ml  Output 3800 ml  Net 1512.33 ml   Filed Weights   10/19/20 2019  Weight: 122 kg    Examination:  General exam: Appears calm and comfortable , not in any distress, deconditioned Respiratory system: Clear to auscultation. Respiratory effort normal. Cardiovascular system: S1 & S2 heard, RRR. No JVD, murmurs, rubs, gallops or clicks. No pedal edema. Gastrointestinal system: Abdomen is soft , non distended and non tender. No organomegaly or masses felt.  Sluggish bowel sounds.   Central nervous system: Alert and oriented x 2 . No focal neurological deficits. Extremities:  No edema, no cyanosis, no clubbing. Skin: No rashes, lesions or ulcers Psychiatry: Judgement and  insight appear normal. Mood & affect appropriate.     Data Reviewed: I have personally reviewed following labs and imaging studies  CBC: Recent Labs  Lab 10/19/20 2029 10/20/20 0355 10/21/20 0245 10/22/20 0248  WBC 22.7* 20.0* 17.6* 15.4*  NEUTROABS 18.6*  --   --   --   HGB 11.5* 10.9* 11.3* 11.7*  HCT 36.0* 33.4* 34.5* 37.3*  MCV 89.8 88.4 88.2 90.3  PLT 384 344 369 [id number]   Basic Metabolic Panel: Recent Labs  Lab 10/19/20 2029 10/20/20 0355 10/21/20 0245 10/21/20 2153 10/22/20 0248  NA 134* 134* 139  --  146*  K 4.3 3.4* 2.6* 2.5* 2.6*  CL 102 106 112*  --  122*  CO2 16* 15* 16*  --  17*  GLUCOSE 182* 147* 67*  --  126*  BUN 127* 110* 91*  --  69*  CREATININE 3.92* 3.28* 2.24*  --  1.64*  CALCIUM 8.3* 8.1* 8.1*  --  8.2*  MG  --   --  2.6*  --  2.6*  PHOS  --   --  3.8  --  2.5   GFR: Estimated Creatinine Clearance: 48.7 mL/min (A) (by C-G formula based on SCr of 1.64 mg/dL (H)). Liver Function Tests: Recent Labs  Lab 10/19/20 2029 10/20/20 0355 10/21/20 0245  AST 33 29 54*  ALT '26 25 30  '$ ALKPHOS 50 49 53  BILITOT 0.6 0.5 0.5  PROT 6.9 6.2* 6.1*  ALBUMIN 2.8* 2.7* 2.4*   No results for input(s): LIPASE, AMYLASE in the last 168 hours. No results for input(s): AMMONIA in the last 168 hours. Coagulation Profile: Recent Labs  Lab 10/19/20 2029 10/20/20 0355  INR 1.3* 1.2   Cardiac Enzymes: No results for input(s): CKTOTAL, CKMB, CKMBINDEX, TROPONINI in the last 168 hours. BNP (last 3 results) No results for input(s): PROBNP in the last 8760 hours. HbA1C: Recent Labs    10/19/20 2029  HGBA1C 6.5*   CBG: Recent Labs  Lab 10/21/20 2007 10/21/20 2315 10/22/20 0354 10/22/20 0805 10/22/20 1104  GLUCAP 102* 128* 126* 151* 166*   Lipid Profile: No results for input(s): CHOL, HDL, LDLCALC, TRIG, CHOLHDL, LDLDIRECT in the last 72 hours. Thyroid Function Tests: No results for input(s): TSH, T4TOTAL, FREET4, T3FREE, THYROIDAB in the last 72  hours. Anemia Panel: No results for input(s): VITAMINB12, FOLATE, FERRITIN, TIBC, IRON, RETICCTPCT in the last 72 hours. Sepsis Labs: Recent Labs  Lab 10/19/20 2029 10/19/20 2255 10/20/20 0027 10/20/20 0327 10/20/20 0355  PROCALCITON  --   --   --   --  6.58  LATICACIDVEN 2.2* 1.5 1.3 0.9  --     Recent Results (from the past 240 hour(s))  Resp Panel by RT-PCR (Flu A&B, Covid) Nasopharyngeal Swab     Status: None   Collection Time: 10/19/20  8:29 PM   Specimen: Nasopharyngeal Swab; Nasopharyngeal(NP) swabs in vial transport medium  Result Value Ref Range Status   SARS Coronavirus 2 by RT PCR NEGATIVE NEGATIVE Final    Comment: (NOTE) SARS-CoV-2 target nucleic acids are NOT DETECTED.  The SARS-CoV-2 RNA is generally detectable in upper respiratory specimens during the acute phase of infection. The lowest concentration of SARS-CoV-2 viral copies this assay can detect is 138 copies/mL. A negative result does not preclude SARS-Cov-2 infection and should not be used as the sole basis for treatment or other patient management decisions. A negative result may occur with  improper specimen collection/handling, submission of specimen other than nasopharyngeal swab, presence of viral mutation(s) within the areas targeted by this assay, and inadequate number of viral copies(<138 copies/mL). A negative result must be combined with clinical observations, patient history, and epidemiological information. The expected result is Negative.  Fact Sheet for Patients:  EntrepreneurPulse.com.au  Fact Sheet for Healthcare Providers:  IncredibleEmployment.be  This test is no t yet approved or cleared by the [place] FDA and  has been authorized for detection and/or diagnosis of SARS-CoV-2 by FDA under an Emergency Use Authorization (EUA). This EUA will remain  in effect (meaning this test can be used) for the duration of the COVID-19 declaration under  Section 564(b)(1) of the Act, 21 U.S.C.section 360bbb-3(b)(1), unless the authorization is terminated  or revoked sooner.       Influenza A by PCR NEGATIVE NEGATIVE Final   Influenza B by PCR NEGATIVE NEGATIVE Final  Comment: (NOTE) The Xpert Xpress SARS-CoV-2/FLU/RSV plus assay is intended as an aid in the diagnosis of influenza from Nasopharyngeal swab specimens and should not be used as a sole basis for treatment. Nasal washings and aspirates are unacceptable for Xpert Xpress SARS-CoV-2/FLU/RSV testing.  Fact Sheet for Patients: EntrepreneurPulse.com.au  Fact Sheet for Healthcare Providers: IncredibleEmployment.be  This test is not yet approved or cleared by the [place] FDA and has been authorized for detection and/or diagnosis of SARS-CoV-2 by FDA under an Emergency Use Authorization (EUA). This EUA will remain in effect (meaning this test can be used) for the duration of the COVID-19 declaration under Section 564(b)(1) of the Act, 21 U.S.C. section 360bbb-3(b)(1), unless the authorization is terminated or revoked.  Performed at [facility], [place] [address] [place], [place] [address]   Blood Culture (routine x 2)     Status: None (Preliminary result)   Collection Time: 10/19/20  8:29 PM   Specimen: BLOOD  Result Value Ref Range Status   Specimen Description   Final    BLOOD BLOOD LEFT ARM Performed at [place] [address], [place], [place] [address]    Special Requests   Final    BOTTLES DRAWN AEROBIC AND ANAEROBIC Blood Culture adequate volume Performed at [place] [address] [place], [place] [address]    Culture   Final    NO GROWTH 3 DAYS Performed at [facility], [place] [address] [place], [place] [address]    Report Status PENDING  Incomplete  Urine Culture     Status: None   Collection Time: 10/19/20  8:29 PM   Specimen: In/Out Cath  Urine  Result Value Ref Range Status   Specimen Description   Final    IN/OUT CATH URINE Performed at [place] [address], [place], [place] [address]    Special Requests   Final    NONE Performed at [facility], [place] [address] [place], [place] [address]    Culture   Final    NO GROWTH Performed at [facility], [place] [address], [place], [place] [address]    Report Status 10/21/2020 FINAL  Final  Blood Culture (routine x 2)     Status: None (Preliminary result)   Collection Time: 10/20/20  4:00 AM   Specimen: BLOOD  Result Value Ref Range Status   Specimen Description   Final    BLOOD BLOOD RIGHT ARM Performed at [facility] [address], [place], [place] [address]    Special Requests   Final    BOTTLES DRAWN AEROBIC AND ANAEROBIC Blood Culture adequate volume Performed at [place] [address], [place], [place] [address]    Culture   Final    NO GROWTH 2 DAYS Performed at [place] [address] [place], [place] [address]    Report Status PENDING  Incomplete  MRSA Next Gen by PCR, Nasal     Status: None   Collection Time: 10/20/20  7:35 AM   Specimen: Nasal Mucosa; Nasal Swab  Result Value Ref Range Status   MRSA by PCR Next Gen NOT DETECTED NOT DETECTED Final    Comment: (NOTE) The GeneXpert MRSA Assay (FDA approved for NASAL specimens only), is one component of a comprehensive MRSA colonization surveillance program. It is not intended to diagnose MRSA infection nor to guide or monitor treatment for MRSA infections. Test performance is  not FDA approved in patients less than 51 years old. Performed at [facility], [place] [address], [place], [place] [address]   C Difficile Quick Screen w PCR reflex     Status: None   Collection Time: 10/20/20  4:58 PM   Specimen: STOOL  Result Value Ref Range Status   C Diff antigen NEGATIVE NEGATIVE  Final   C Diff toxin NEGATIVE NEGATIVE Final   C Diff interpretation No C. difficile detected.  Final    Comment: Performed at [facility], [place] [address], [place], [place] [address]    Radiology Studies: DG Abd Portable 1V  Result Date: 10/21/2020 CLINICAL DATA:  75 year old male with abdominal pain, colonic obstruction EXAM: PORTABLE ABDOMEN - 1 VIEW COMPARISON:  CT scan of the abdomen and pelvis 10/19/2020 FINDINGS: Persistent gaseous distension of the colon. The largest diameter is present in the transverse colon which measures up to 11.1 cm in diameter. Multiple stones project over the right upper quadrant consistent with cholelithiasis. No large free air on this single supine view. No acute osseous abnormality. IMPRESSION: Persistent gaseous distension of the colon, particularly the transverse colon which measures up to 11.1 cm in diameter. Cholelithiasis. Electronically Signed   By: Jacqulynn Cadet M.D.   On: 10/21/2020 10:12   ECHOCARDIOGRAM COMPLETE  Result Date: 10/21/2020    ECHOCARDIOGRAM REPORT   Patient Name:   MCCLINTON TEH Date of Exam: 10/21/2020 Medical Rec #:  [id number]     Height:       68.0 in Accession #:    [id number]    Weight:       269.0 lb Date of Birth:  01-17-1946     BSA:          2.318 m Patient Age:    62 years      BP:           135/53 mmHg Patient Gender: M             HR:           84 bpm. Exam Location:  Inpatient Procedure: 2D Echo, Cardiac Doppler, Color Doppler and Intracardiac            Opacification Agent Indications:     Atrial Fibrillation I48.91  History:         Patient has prior history of Echocardiogram examinations, most                  recent 03/23/2017. COPD; Risk Factors:Hypertension, Diabetes,                  Dyslipidemia and Non-Smoker. CKD.  Sonographer:     Vickie Epley RDCS Referring Phys:  [place] Diagnosing Phys: Loralie Champagne MD IMPRESSIONS  1. Left ventricular ejection fraction, by estimation, is 45  to 50%. The left ventricle has mildly decreased function. The left ventricle demonstrates global hypokinesis. There is mild left ventricular hypertrophy. Left ventricular diastolic parameters are consistent with Grade I diastolic dysfunction (impaired relaxation).  2. Right ventricular systolic function is normal. The right ventricular size is not well visualized. Tricuspid regurgitation signal is inadequate for assessing PA pressure.  3. The mitral valve is normal in structure. No evidence of mitral valve regurgitation. No evidence of mitral stenosis.  4. The aortic valve is tricuspid. Aortic valve regurgitation is not visualized. Mild aortic valve sclerosis is present, with no evidence of aortic valve stenosis.  5. The inferior vena cava  is dilated in size with <50% respiratory variability, suggesting right atrial pressure of 15 mmHg.  6. Technically difficult study with poor acoustic windows. FINDINGS  Left Ventricle: Left ventricular ejection fraction, by estimation, is 45 to 50%. The left ventricle has mildly decreased function. The left ventricle demonstrates global hypokinesis. Definity contrast agent was given IV to delineate the left ventricular  endocardial borders. The left ventricular internal cavity size was normal in size. There is mild left ventricular hypertrophy. Left ventricular diastolic parameters are consistent with Grade I diastolic dysfunction (impaired relaxation). Right Ventricle: The right ventricular size is not well visualized. Right vetricular wall thickness was not well visualized. Right ventricular systolic function is normal. Tricuspid regurgitation signal is inadequate for assessing PA pressure. Left Atrium: Left atrial size was normal in size. Right Atrium: Right atrial size was normal in size. Pericardium: There is no evidence of pericardial effusion. Mitral Valve: The mitral valve is normal in structure. There is mild calcification of the mitral valve leaflet(s). Mild mitral  annular calcification. No evidence of mitral valve regurgitation. No evidence of mitral valve stenosis. Tricuspid Valve: The tricuspid valve is normal in structure. Tricuspid valve regurgitation is not demonstrated. Aortic Valve: The aortic valve is tricuspid. Aortic valve regurgitation is not visualized. Mild aortic valve sclerosis is present, with no evidence of aortic valve stenosis. Pulmonic Valve: The pulmonic valve was not well visualized. Pulmonic valve regurgitation is not visualized. Aorta: The aortic root is normal in size and structure. Venous: The inferior vena cava is dilated in size with less than 50% respiratory variability, suggesting right atrial pressure of 15 mmHg. IAS/Shunts: No atrial level shunt detected by color flow Doppler.  LEFT VENTRICLE PLAX 2D LVIDd:         5.20 cm      Diastology LVIDs:         3.80 cm      LV e' medial:    5.55 cm/s LV PW:         0.90 cm      LV E/e' medial:  13.3 LV IVS:        0.90 cm      LV e' lateral:   8.92 cm/s LVOT diam:     2.10 cm      LV E/e' lateral: 8.3 LV SV:         51 LV SV Index:   22 LVOT Area:     3.46 cm  LV Volumes (MOD) LV vol d, MOD A2C: 130.0 ml LV vol d, MOD A4C: 130.0 ml LV vol s, MOD A2C: 63.8 ml LV vol s, MOD A4C: 64.2 ml LV SV MOD A2C:     66.2 ml LV SV MOD A4C:     130.0 ml LV SV MOD BP:      72.6 ml RIGHT VENTRICLE RV S prime:     12.80 cm/s TAPSE (M-mode): 1.7 cm LEFT ATRIUM             Index       RIGHT ATRIUM           Index LA diam:        4.00 cm 1.73 cm/m  RA Area:     13.40 cm LA Vol (A2C):   39.7 ml 17.13 ml/m RA Volume:   31.60 ml  13.63 ml/m LA Vol (A4C):   33.3 ml 14.37 ml/m LA Biplane Vol: 36.9 ml 15.92 ml/m  AORTIC VALVE LVOT Vmax:   84.20 cm/s LVOT Vmean:  53.600  cm/s LVOT VTI:    0.146 m  AORTA Ao Root diam: 3.50 cm Ao Asc diam:  3.60 cm MITRAL VALVE MV Area (PHT): 3.81 cm    SHUNTS MV Decel Time: 199 msec    Systemic VTI:  0.15 m MV E velocity: 73.70 cm/s  Systemic Diam: 2.10 cm MV A velocity: 81.80 cm/s MV E/A  ratio:  0.90 Loralie Champagne MD Electronically signed by Loralie Champagne MD Signature Date/Time: 10/21/2020/4:15:42 PM    Final (Updated)      Scheduled Meds:  chlorhexidine  15 mL Mouth Rinse BID   Chlorhexidine Gluconate Cloth  6 each Topical Daily   insulin aspart  0-9 Units Subcutaneous Q4H   insulin detemir  15 Units Subcutaneous BID   mouth rinse  15 mL Mouth Rinse BID   pneumococcal 23 valent vaccine  0.5 mL Intramuscular Tomorrow-1000   Continuous Infusions:  acetaminophen     amiodarone 30 mg/hr (10/22/20 1002)   ceFEPime (MAXIPIME) IV Stopped (10/22/20 0934)   dextrose 5% lactated ringers 125 mL/hr at 10/22/20 1002   lactated ringers Stopped (10/21/20 0423)   metronidazole Stopped (10/22/20 0911)   potassium chloride 10 mEq (10/22/20 1037)     LOS: 2 days    Time spent: 25 mins    Bethsaida Siegenthaler, MD [facility]   If 7PM-7AM, please contact night-coverage

## 2020-10-22 NOTE — Progress Notes (Signed)
Patient's HR has remained in SR throughout the shift. Cards notified by this RN of HR drop to 59 bpm intermittently, mainly staying in 60's. Orders from Dr. Marlou Porch to stop amio gtt and start PO amio (see mar). This RN will continue to carefully monitor pt.

## 2020-10-23 DIAGNOSIS — R198 Other specified symptoms and signs involving the digestive system and abdomen: Secondary | ICD-10-CM | POA: Diagnosis not present

## 2020-10-23 LAB — GLUCOSE, CAPILLARY
Glucose-Capillary: 137 mg/dL — ABNORMAL HIGH (ref 70–99)
Glucose-Capillary: 147 mg/dL — ABNORMAL HIGH (ref 70–99)
Glucose-Capillary: 162 mg/dL — ABNORMAL HIGH (ref 70–99)
Glucose-Capillary: 177 mg/dL — ABNORMAL HIGH (ref 70–99)
Glucose-Capillary: 186 mg/dL — ABNORMAL HIGH (ref 70–99)
Glucose-Capillary: 197 mg/dL — ABNORMAL HIGH (ref 70–99)

## 2020-10-23 LAB — CBC
HCT: 34.2 % — ABNORMAL LOW (ref 39.0–52.0)
Hemoglobin: 10.9 g/dL — ABNORMAL LOW (ref 13.0–17.0)
MCH: 27.9 pg (ref 26.0–34.0)
MCHC: 31.9 g/dL (ref 30.0–36.0)
MCV: 87.7 fL (ref 80.0–100.0)
Platelets: 396 10*3/uL (ref 150–400)
RBC: 3.9 MIL/uL — ABNORMAL LOW (ref 4.22–5.81)
RDW: 14.8 % (ref 11.5–15.5)
WBC: 19.9 10*3/uL — ABNORMAL HIGH (ref 4.0–10.5)
nRBC: 0 % (ref 0.0–0.2)

## 2020-10-23 LAB — BASIC METABOLIC PANEL
Anion gap: 9 (ref 5–15)
BUN: 44 mg/dL — ABNORMAL HIGH (ref 8–23)
CO2: 16 mmol/L — ABNORMAL LOW (ref 22–32)
Calcium: 8.6 mg/dL — ABNORMAL LOW (ref 8.9–10.3)
Chloride: 127 mmol/L — ABNORMAL HIGH (ref 98–111)
Creatinine, Ser: 1.27 mg/dL — ABNORMAL HIGH (ref 0.61–1.24)
GFR, Estimated: 59 mL/min — ABNORMAL LOW (ref >60)
Glucose, Bld: 159 mg/dL — ABNORMAL HIGH (ref 70–99)
Potassium: 3.3 mmol/L — ABNORMAL LOW (ref 3.5–5.1)
Sodium: 152 mmol/L — ABNORMAL HIGH (ref 135–145)

## 2020-10-23 LAB — MAGNESIUM: Magnesium: 2.3 mg/dL (ref 1.7–2.4)

## 2020-10-23 LAB — PHOSPHORUS: Phosphorus: 1.5 mg/dL — ABNORMAL LOW (ref 2.5–4.6)

## 2020-10-23 MED ORDER — POTASSIUM CHLORIDE CRYS ER 20 MEQ PO TBCR
40.0000 meq | EXTENDED_RELEASE_TABLET | Freq: Once | ORAL | Status: AC
  Administered 2020-10-23: 40 meq via ORAL
  Filled 2020-10-23: qty 2

## 2020-10-23 MED ORDER — SODIUM BICARBONATE 8.4 % IV SOLN
INTRAVENOUS | Status: DC
  Filled 2020-10-23 (×2): qty 1000
  Filled 2020-10-23: qty 150

## 2020-10-23 MED ORDER — HYDRALAZINE HCL 25 MG PO TABS
25.0000 mg | ORAL_TABLET | Freq: Four times a day (QID) | ORAL | Status: DC | PRN
  Administered 2020-10-23 – 2020-10-24 (×3): 25 mg via ORAL
  Filled 2020-10-23 (×3): qty 1

## 2020-10-23 MED ORDER — SODIUM CHLORIDE 0.9 % IV SOLN
2.0000 g | Freq: Three times a day (TID) | INTRAVENOUS | Status: DC
  Administered 2020-10-23 – 2020-10-25 (×6): 2 g via INTRAVENOUS
  Filled 2020-10-23 (×6): qty 2

## 2020-10-23 MED ORDER — CARVEDILOL 3.125 MG PO TABS
3.1250 mg | ORAL_TABLET | Freq: Two times a day (BID) | ORAL | Status: DC
  Administered 2020-10-23 – 2020-10-24 (×2): 3.125 mg via ORAL
  Filled 2020-10-23 (×2): qty 1

## 2020-10-23 MED ORDER — POTASSIUM PHOSPHATES 15 MMOLE/5ML IV SOLN
20.0000 mmol | Freq: Once | INTRAVENOUS | Status: AC
  Administered 2020-10-23: 20 mmol via INTRAVENOUS
  Filled 2020-10-23: qty 6.67

## 2020-10-23 NOTE — TOC Progression Note (Addendum)
Transition of Care [facility]) - Progression Note    Patient Details  Name: Mark Chang MRN: [id number] Date of Birth: July 19, 1945  Transition of Care [facility]) CM/SW Contact  Leeroy Cha, RN Phone Number: 10/23/2020, 8:57 AM  Clinical Narrative:    Multiple attempts to contact brother at the information in the face sheet to no avail.  The number is a wireless that is not accepting patients at this time. Tct-[place] police well check requested. Tct-[employer]-they have not seen the brother or heard from him in a long time.  The only contact number they have is the [phone]- number in the chart. [place] number is [id number] o Ssn: [phone] Expected Discharge Plan: Home/Self Care Barriers to Discharge: Continued Medical Work up  Expected Discharge Plan and Services Expected Discharge Plan: Home/Self Care   Discharge Planning Services: CM Consult   Living arrangements for the past 2 months: Single Family Home                                       Social Determinants of Health (SDOH) Interventions    Readmission Risk Interventions No flowsheet data found.

## 2020-10-23 NOTE — Progress Notes (Addendum)
Progress Note  Patient Name: Mark Chang Date of Encounter: 10/23/2020  [facility] Cardiologist: Candee Furbish, MD   Subjective   2/10 abdominal pain, not bad. Mild dyspnea. Initially said no chest pain this morning, when asked about when was the last episode of chest pain, his answer was "this morning".   Inpatient Medications    Scheduled Meds:  amiodarone  200 mg Oral BID   chlorhexidine  15 mL Mouth Rinse BID   Chlorhexidine Gluconate Cloth  6 each Topical Daily   insulin aspart  0-9 Units Subcutaneous Q4H   insulin detemir  15 Units Subcutaneous BID   mouth rinse  15 mL Mouth Rinse BID   pneumococcal 23 valent vaccine  0.5 mL Intramuscular Tomorrow-1000   Continuous Infusions:  ceFEPime (MAXIPIME) IV Stopped (10/23/20 1012)   dextrose 5% lactated ringers Stopped (10/23/20 0944)   metronidazole Stopped (10/23/20 0858)   PRN Meds: hydrALAZINE, HYDROmorphone (DILAUDID) injection, ondansetron **OR** ondansetron (ZOFRAN) IV   Vital Signs    Vitals:   10/23/20 0746 10/23/20 0800 10/23/20 0900 10/23/20 1000  BP:  (!) 164/71 (!) 171/75 (!) 182/63  Pulse:  82 84 77  Resp:  '20 18 18  '$ Temp: 98.4 F (36.9 C)     TempSrc: Oral     SpO2:  96% 96% 98%  Weight:      Height:        Intake/Output Summary (Last 24 hours) at 10/23/2020 1042 Last data filed at 10/23/2020 1028 Gross per 24 hour  Intake 4456.04 ml  Output 3400 ml  Net 1056.04 ml   Last 3 Weights 10/19/2020 03/22/2017 03/21/2017  Weight (lbs) 269 lb 269 lb 10 oz 262 lb 2 oz  Weight (kg) 122.018 kg 122.3 kg 118.9 kg      Telemetry    NSR without recurrent afib- Personally Reviewed  ECG    Atrial fibrillation with RVR 7/24  - Personally Reviewed  Physical Exam   GEN: No acute distress.   Neck: No JVD Cardiac: RRR, no murmurs, rubs, or gallops.  Respiratory: Clear to auscultation bilaterally. GI: Soft, nontender, non-distended  MS: No edema; No deformity. Neuro:  Nonfocal  Psych: Normal affect    Labs    High Sensitivity Troponin:  No results for input(s): TROPONINIHS in the last 720 hours.    Chemistry Recent Labs  Lab 10/19/20 2029 10/20/20 0355 10/21/20 0245 10/21/20 2153 10/22/20 0248 10/22/20 1656 10/23/20 0241  NA 134* 134* 139  --  146*  --  152*  K 4.3 3.4* 2.6*   < > 2.6* 3.1* 3.3*  CL 102 106 112*  --  122*  --  127*  CO2 16* 15* 16*  --  17*  --  16*  GLUCOSE 182* 147* 67*  --  126*  --  159*  BUN 127* 110* 91*  --  69*  --  44*  CREATININE 3.92* 3.28* 2.24*  --  1.64*  --  1.27*  CALCIUM 8.3* 8.1* 8.1*  --  8.2*  --  8.6*  PROT 6.9 6.2* 6.1*  --   --   --   --   ALBUMIN 2.8* 2.7* 2.4*  --   --   --   --   AST 33 29 54*  --   --   --   --   ALT '26 25 30  '$ --   --   --   --   ALKPHOS 50 49 53  --   --   --   --  BILITOT 0.6 0.5 0.5  --   --   --   --   GFRNONAA 15* 19* 30*  --  43*  --  59*  ANIONGAP 16* 13 11  --  7  --  9   < > = values in this interval not displayed.     Hematology Recent Labs  Lab 10/21/20 0245 10/22/20 0248 10/23/20 0241  WBC 17.6* 15.4* 19.9*  RBC 3.91* 4.13* 3.90*  HGB 11.3* 11.7* 10.9*  HCT 34.5* 37.3* 34.2*  MCV 88.2 90.3 87.7  MCH 28.9 28.3 27.9  MCHC 32.8 31.4 31.9  RDW 14.4 14.4 14.8  PLT 369 375 396    BNPNo results for input(s): BNP, PROBNP in the last 168 hours.   DDimer No results for input(s): DDIMER in the last 168 hours.   Radiology    ECHOCARDIOGRAM COMPLETE  Result Date: 10/21/2020    ECHOCARDIOGRAM REPORT   Patient Name:   Mark Chang Date of Exam: 10/21/2020 Medical Rec #:  [id number]     Height:       68.0 in Accession #:    [id number]    Weight:       269.0 lb Date of Birth:  09-Oct-1945     BSA:          2.318 m Patient Age:    56 years      BP:           135/53 mmHg Patient Gender: M             HR:           84 bpm. Exam Location:  Inpatient Procedure: 2D Echo, Cardiac Doppler, Color Doppler and Intracardiac            Opacification Agent Indications:     Atrial Fibrillation I48.91  History:          Patient has prior history of Echocardiogram examinations, most                  recent 03/23/2017. COPD; Risk Factors:Hypertension, Diabetes,                  Dyslipidemia and Non-Smoker. CKD.  Sonographer:     Vickie Epley RDCS Referring Phys:  [place] Diagnosing Phys: Loralie Champagne MD IMPRESSIONS  1. Left ventricular ejection fraction, by estimation, is 45 to 50%. The left ventricle has mildly decreased function. The left ventricle demonstrates global hypokinesis. There is mild left ventricular hypertrophy. Left ventricular diastolic parameters are consistent with Grade I diastolic dysfunction (impaired relaxation).  2. Right ventricular systolic function is normal. The right ventricular size is not well visualized. Tricuspid regurgitation signal is inadequate for assessing PA pressure.  3. The mitral valve is normal in structure. No evidence of mitral valve regurgitation. No evidence of mitral stenosis.  4. The aortic valve is tricuspid. Aortic valve regurgitation is not visualized. Mild aortic valve sclerosis is present, with no evidence of aortic valve stenosis.  5. The inferior vena cava is dilated in size with <50% respiratory variability, suggesting right atrial pressure of 15 mmHg.  6. Technically difficult study with poor acoustic windows. FINDINGS  Left Ventricle: Left ventricular ejection fraction, by estimation, is 45 to 50%. The left ventricle has mildly decreased function. The left ventricle demonstrates global hypokinesis. Definity contrast agent was given IV to delineate the left ventricular  endocardial borders. The left ventricular internal cavity size was normal in size. There is mild left ventricular  hypertrophy. Left ventricular diastolic parameters are consistent with Grade I diastolic dysfunction (impaired relaxation). Right Ventricle: The right ventricular size is not well visualized. Right vetricular wall thickness was not well visualized. Right ventricular systolic  function is normal. Tricuspid regurgitation signal is inadequate for assessing PA pressure. Left Atrium: Left atrial size was normal in size. Right Atrium: Right atrial size was normal in size. Pericardium: There is no evidence of pericardial effusion. Mitral Valve: The mitral valve is normal in structure. There is mild calcification of the mitral valve leaflet(s). Mild mitral annular calcification. No evidence of mitral valve regurgitation. No evidence of mitral valve stenosis. Tricuspid Valve: The tricuspid valve is normal in structure. Tricuspid valve regurgitation is not demonstrated. Aortic Valve: The aortic valve is tricuspid. Aortic valve regurgitation is not visualized. Mild aortic valve sclerosis is present, with no evidence of aortic valve stenosis. Pulmonic Valve: The pulmonic valve was not well visualized. Pulmonic valve regurgitation is not visualized. Aorta: The aortic root is normal in size and structure. Venous: The inferior vena cava is dilated in size with less than 50% respiratory variability, suggesting right atrial pressure of 15 mmHg. IAS/Shunts: No atrial level shunt detected by color flow Doppler.  LEFT VENTRICLE PLAX 2D LVIDd:         5.20 cm      Diastology LVIDs:         3.80 cm      LV e' medial:    5.55 cm/s LV PW:         0.90 cm      LV E/e' medial:  13.3 LV IVS:        0.90 cm      LV e' lateral:   8.92 cm/s LVOT diam:     2.10 cm      LV E/e' lateral: 8.3 LV SV:         51 LV SV Index:   22 LVOT Area:     3.46 cm  LV Volumes (MOD) LV vol d, MOD A2C: 130.0 ml LV vol d, MOD A4C: 130.0 ml LV vol s, MOD A2C: 63.8 ml LV vol s, MOD A4C: 64.2 ml LV SV MOD A2C:     66.2 ml LV SV MOD A4C:     130.0 ml LV SV MOD BP:      72.6 ml RIGHT VENTRICLE RV S prime:     12.80 cm/s TAPSE (M-mode): 1.7 cm LEFT ATRIUM             Index       RIGHT ATRIUM           Index LA diam:        4.00 cm 1.73 cm/m  RA Area:     13.40 cm LA Vol (A2C):   39.7 ml 17.13 ml/m RA Volume:   31.60 ml  13.63 ml/m LA Vol  (A4C):   33.3 ml 14.37 ml/m LA Biplane Vol: 36.9 ml 15.92 ml/m  AORTIC VALVE LVOT Vmax:   84.20 cm/s LVOT Vmean:  53.600 cm/s LVOT VTI:    0.146 m  AORTA Ao Root diam: 3.50 cm Ao Asc diam:  3.60 cm MITRAL VALVE MV Area (PHT): 3.81 cm    SHUNTS MV Decel Time: 199 msec    Systemic VTI:  0.15 m MV E velocity: 73.70 cm/s  Systemic Diam: 2.10 cm MV A velocity: 81.80 cm/s MV E/A ratio:  0.90 Loralie Champagne MD Electronically signed by Loralie Champagne MD Signature Date/Time: 10/21/2020/4:15:42 PM  Final (Updated)     Cardiac Studies   Echo 10/21/2020  1. Left ventricular ejection fraction, by estimation, is 45 to 50%. The  left ventricle has mildly decreased function. The left ventricle  demonstrates global hypokinesis. There is mild left ventricular  hypertrophy. Left ventricular diastolic parameters  are consistent with Grade I diastolic dysfunction (impaired relaxation).   2. Right ventricular systolic function is normal. The right ventricular  size is not well visualized. Tricuspid regurgitation signal is inadequate  for assessing PA pressure.   3. The mitral valve is normal in structure. No evidence of mitral valve  regurgitation. No evidence of mitral stenosis.   4. The aortic valve is tricuspid. Aortic valve regurgitation is not  visualized. Mild aortic valve sclerosis is present, with no evidence of  aortic valve stenosis.   5. The inferior vena cava is dilated in size with <50% respiratory  variability, suggesting right atrial pressure of 15 mmHg.   6. Technically difficult study with poor acoustic windows.   Patient Profile     75 y.o. male with a hx of HTN, DM2, CKD stage IIIa and intellectual disability who is being seen today for the evaluation of new onset atrial fibrillation with RVR at the request of Dr. Dwyane Dee.  Assessment & Plan    New onset of atrial fibrillation  - CHA2DS2-Vasc score of 4, will consider Eliquis if it is sure he will not going for surgery.  - continue  amiodarone, converted to PO  Septic shock with concern of perforated abdominal viscus  - Surgery and GI consulted. Treated with abx. Has not been able to contact the patient's power of attorney based on the recent note. Fortunately his symptom seems to be improving.   AKI: Cr 3 --> 1.6 --> 1.2  Cardiomyopathy: EF 45-50% since 2018, repeat echo continue to show the same EF. Per patient intermittent chest pain, unclear if related to afib, may consider outpatient ischemic work up at some point   Hypotension (resolved)--> Hypertension: home lisinopril '40mg'$  held during this admission due to AKI, consider add '5mg'$  amlodipine for now or coreg 3.'125mg'$  BID.       For questions or updates, please contact [place] Please consult [email or web address] for contact info under        Signed, Almyra Deforest, [place]  10/23/2020, 10:42 AM    Personally seen and examined. Agree with above.  Agree with above recommendations.  Continue PO AMIO Start Eliquis if no surgery planned.  Consider amlodipine '5mg'$  or Coreg 3.125 BID for HTN . Pain may be playing a role.   ON DC, recommend changing AMIODARONE to '200mg'$  PO QD. Hopefully will not need long term.   Will set up follow up appt.  Signing off.   Candee Furbish, MD

## 2020-10-23 NOTE — Progress Notes (Signed)
PROGRESS NOTE    Mark Chang  [id number] DOB: May 31, 1945 DOA: 10/19/2020 PCP: Terrill Mohr, NP    Brief Narrative:  Mark Chang is a 75 year old male with past medical history significant for type 2 diabetes mellitus, essential hypertension, CKD stage IIIa, intellectual disability who initially presented to [facility] ED with complaint of abdominal pain, diarrhea and was found to be hypotensive.  Per SNF staff, he reported abdominal pain for the last few weeks.    In the ED, he was found to have a blood pressure of 68/40, temperature 100.5 F, HR 105, lactic acid 2.2, WBC count 22.7.  CT abdomen/pelvis showed minimal amount of free air, cecal mass probably sigmoid colon with concern for perforated viscus.  Gastroenterology and general surgery were consulted.  TRH consulted for further evaluation and management of abdominal pain secondary to cecal mass with likely perforated viscus.   Assessment & Plan:   Principal Problem:   Perforated abdominal viscus Active Problems:   Intellectual disability   AKI (acute kidney injury) ([facility])   Severe sepsis with acute organ dysfunction (HCC)   DM (diabetes mellitus), type 2 with renal complications (HCC)   CKD (chronic kidney disease), stage III (HCC)   Mass of cecum   Pneumatosis coli   Septic shock in the setting of perforated abdominal viscus Patient presenting from SNF with abdominal pain apparently going on for several weeks.  Patient was febrile, tachycardic, tachypneic, with hypotension, elevated lactic acid and elevated WBC count with source being intra-abdominal infection.  On imaging was found to have a cecal mass and pneumatosis.  Patient was seen by gastroenterology, Dr. Paulita Fujita who did not recommend endoscopic procedure given fever, hypotension, leukocytosis. --General surgery following, appreciate assistance --WBC 22.7>>15.4>19.9 --Cefepime 2 g IV every 8 hours --Metronidazole 5 mg IV every 8 hours --Full liquid diet --General  surgery plans sigmoid colectomy, but currently unable to contact brother who is the POA and will be asking for administrative assistance.  --Follow CBC daily  Proximal atrial fibrillation with RVR --Cardiology following, appreciate assistance --Amiodarone 200 mg p.o. twice daily --Coreg 3.125 mg p.o. twice daily --Continue monitor on telemetry  Acute renal failure on CKD stage IIIa --Cr 3.92>>2.24>1.61>1.27  Essential hypertension --Starting Coreg 3.125 mg p.o. twice daily, continue monitor BP and uptitrate as long as heart rate can tolerate --Hydralazine 25 mg PO q6h prn SBP >180 or DBP >110  Type 2 diabetes mellitus --Levemir 15 units Blue [place] BID --sensitive SSI for further coverage --CBGs qAC/HS  Hypokalemia Potassium 3.3, will replete today.  Magnesium 2.3. --Repeat electrolytes in the a.m.  Hypophosphatemia --Phosphorus 1.5, will replete with 20 mmol K-Phos IV --Repeat phosphorus level in a.m.  Hyponatremia Hyperchloremia --Change D5 LR to D5 bicarb drip at 75 MLS per hour --Repeat BMP in a.m.   DVT prophylaxis: Place and maintain sequential compression device Start: 10/22/20 0852 SCDs Start: 10/20/20 0026   Code Status: Full Code Family Communication: No family present at bedside, unable to reach POA brother via telephone  Disposition Plan:  Level of care: Stepdown Status is: Inpatient  Remains inpatient appropriate because:Persistent severe electrolyte disturbances, Ongoing active pain requiring inpatient pain management, Ongoing diagnostic testing needed not appropriate for outpatient work up, Unsafe d/c plan, IV treatments appropriate due to intensity of illness or inability to take PO, and Inpatient level of care appropriate due to severity of illness  Dispo: The patient is from: SNF              Anticipated d/c is  to: SNF              Patient currently is not medically stable to d/c.   Difficult to place patient No   Consultants:  [facility], Dr.  Paulita Fujita General Surgery  Procedures:  None  Antimicrobials:  Cefepime 7/23>> Metronidazole 7/23>>    Subjective: Patient seen examined bedside, resting comfortably.  Abdominal pain slightly improved.  No family present at bedside.  RN present.  Pleasantly confused.  Unable to obtain much ROS due to his underlying intellectual disability.  No acute events overnight per nursing staff.  Staff still unable to contact patient's POA, brother.  Objective: Vitals:   10/23/20 0900 10/23/20 1000 10/23/20 1159 10/23/20 1200  BP: (!) 171/75 (!) 182/63  (!) 159/70  Pulse: 84 77  98  Resp: '18 18  19  '$ Temp:   97.6 F (36.4 C)   TempSrc:   Oral   SpO2: 96% 98%  90%  Weight:      Height:        Intake/Output Summary (Last 24 hours) at 10/23/2020 1446 Last data filed at 10/23/2020 1200 Gross per 24 hour  Intake 3331.78 ml  Output 3200 ml  Net 131.78 ml   Filed Weights   10/19/20 2019  Weight: 122 kg    Examination:  General exam: Appears calm and comfortable, pleasantly confused Respiratory system: Clear to auscultation. Respiratory effort normal.  On room air Cardiovascular system: S1 & S2 heard, RRR. No JVD, murmurs, rubs, gallops or clicks. No pedal edema. Gastrointestinal system: Abdomen is nondistended, soft and nontender. No organomegaly or masses felt. Normal bowel sounds heard. Central nervous system: Alert, not oriented to person/place/time or situation. No focal neurological deficits. Extremities: Symmetric 5 x 5 power. Skin: No rashes, lesions or ulcers Psychiatry: Judgement and insight appear poor. Mood & affect appropriate.     Data Reviewed: I have personally reviewed following labs and imaging studies  CBC: Recent Labs  Lab 10/19/20 2029 10/20/20 0355 10/21/20 0245 10/22/20 0248 10/23/20 0241  WBC 22.7* 20.0* 17.6* 15.4* 19.9*  NEUTROABS 18.6*  --   --   --   --   HGB 11.5* 10.9* 11.3* 11.7* 10.9*  HCT 36.0* 33.4* 34.5* 37.3* 34.2*  MCV 89.8 88.4 88.2  90.3 87.7  PLT 384 344 369 375 [id number]   Basic Metabolic Panel: Recent Labs  Lab 10/19/20 2029 10/20/20 0355 10/21/20 0245 10/21/20 2153 10/22/20 0248 10/22/20 1656 10/23/20 0241  NA 134* 134* 139  --  146*  --  152*  K 4.3 3.4* 2.6* 2.5* 2.6* 3.1* 3.3*  CL 102 106 112*  --  122*  --  127*  CO2 16* 15* 16*  --  17*  --  16*  GLUCOSE 182* 147* 67*  --  126*  --  159*  BUN 127* 110* 91*  --  69*  --  44*  CREATININE 3.92* 3.28* 2.24*  --  1.64*  --  1.27*  CALCIUM 8.3* 8.1* 8.1*  --  8.2*  --  8.6*  MG  --   --  2.6*  --  2.6*  --  2.3  PHOS  --   --  3.8  --  2.5  --  1.5*   GFR: Estimated Creatinine Clearance: 62.9 mL/min (A) (by C-G formula based on SCr of 1.27 mg/dL (H)). Liver Function Tests: Recent Labs  Lab 10/19/20 2029 10/20/20 0355 10/21/20 0245  AST 33 29 54*  ALT '26 25 30  '$ ALKPHOS 50 49  53  BILITOT 0.6 0.5 0.5  PROT 6.9 6.2* 6.1*  ALBUMIN 2.8* 2.7* 2.4*   No results for input(s): LIPASE, AMYLASE in the last 168 hours. No results for input(s): AMMONIA in the last 168 hours. Coagulation Profile: Recent Labs  Lab 10/19/20 2029 10/20/20 0355  INR 1.3* 1.2   Cardiac Enzymes: No results for input(s): CKTOTAL, CKMB, CKMBINDEX, TROPONINI in the last 168 hours. BNP (last 3 results) No results for input(s): PROBNP in the last 8760 hours. HbA1C: No results for input(s): HGBA1C in the last 72 hours. CBG: Recent Labs  Lab 10/22/20 2014 10/22/20 2347 10/23/20 0411 10/23/20 0748 10/23/20 1158  GLUCAP 161* 156* 137* 147* 197*   Lipid Profile: No results for input(s): CHOL, HDL, LDLCALC, TRIG, CHOLHDL, LDLDIRECT in the last 72 hours. Thyroid Function Tests: No results for input(s): TSH, T4TOTAL, FREET4, T3FREE, THYROIDAB in the last 72 hours. Anemia Panel: No results for input(s): VITAMINB12, FOLATE, FERRITIN, TIBC, IRON, RETICCTPCT in the last 72 hours. Sepsis Labs: Recent Labs  Lab 10/19/20 2029 10/19/20 2255 10/20/20 0027 10/20/20 0327  10/20/20 0355  PROCALCITON  --   --   --   --  6.58  LATICACIDVEN 2.2* 1.5 1.3 0.9  --     Recent Results (from the past 240 hour(s))  Resp Panel by RT-PCR (Flu A&B, Covid) Nasopharyngeal Swab     Status: None   Collection Time: 10/19/20  8:29 PM   Specimen: Nasopharyngeal Swab; Nasopharyngeal(NP) swabs in vial transport medium  Result Value Ref Range Status   SARS Coronavirus 2 by RT PCR NEGATIVE NEGATIVE Final    Comment: (NOTE) SARS-CoV-2 target nucleic acids are NOT DETECTED.  The SARS-CoV-2 RNA is generally detectable in upper respiratory specimens during the acute phase of infection. The lowest concentration of SARS-CoV-2 viral copies this assay can detect is 138 copies/mL. A negative result does not preclude SARS-Cov-2 infection and should not be used as the sole basis for treatment or other patient management decisions. A negative result may occur with  improper specimen collection/handling, submission of specimen other than nasopharyngeal swab, presence of viral mutation(s) within the areas targeted by this assay, and inadequate number of viral copies(<138 copies/mL). A negative result must be combined with clinical observations, patient history, and epidemiological information. The expected result is Negative.  Fact Sheet for Patients:  EntrepreneurPulse.com.au  Fact Sheet for Healthcare Providers:  IncredibleEmployment.be  This test is no t yet approved or cleared by the [place] FDA and  has been authorized for detection and/or diagnosis of SARS-CoV-2 by FDA under an Emergency Use Authorization (EUA). This EUA will remain  in effect (meaning this test can be used) for the duration of the COVID-19 declaration under Section 564(b)(1) of the Act, 21 U.S.C.section 360bbb-3(b)(1), unless the authorization is terminated  or revoked sooner.       Influenza A by PCR NEGATIVE NEGATIVE Final   Influenza B by PCR NEGATIVE NEGATIVE  Final    Comment: (NOTE) The Xpert Xpress SARS-CoV-2/FLU/RSV plus assay is intended as an aid in the diagnosis of influenza from Nasopharyngeal swab specimens and should not be used as a sole basis for treatment. Nasal washings and aspirates are unacceptable for Xpert Xpress SARS-CoV-2/FLU/RSV testing.  Fact Sheet for Patients: EntrepreneurPulse.com.au  Fact Sheet for Healthcare Providers: IncredibleEmployment.be  This test is not yet approved or cleared by the [place] FDA and has been authorized for detection and/or diagnosis of SARS-CoV-2 by FDA under an Emergency Use Authorization (EUA). This EUA will remain  in effect (meaning this test can be used) for the duration of the COVID-19 declaration under Section 564(b)(1) of the Act, 21 U.S.C. section 360bbb-3(b)(1), unless the authorization is terminated or revoked.  Performed at [facility], [place] [address], [place], [place] [address]   Blood Culture (routine x 2)     Status: None (Preliminary result)   Collection Time: 10/19/20  8:29 PM   Specimen: BLOOD  Result Value Ref Range Status   Specimen Description   Final    BLOOD BLOOD LEFT ARM Performed at [facility] [address] [place], [place] [address]    Special Requests   Final    BOTTLES DRAWN AEROBIC AND ANAEROBIC Blood Culture adequate volume Performed at [facility] [address] [place], [place] [address]    Culture   Final    NO GROWTH 4 DAYS Performed at [facility], [place] [address], [place], [place] [address]    Report Status PENDING  Incomplete  Urine Culture     Status: None   Collection Time: 10/19/20  8:29 PM   Specimen: In/Out Cath Urine  Result Value Ref Range Status   Specimen Description   Final    IN/OUT CATH URINE Performed at [facility] [address], [place], [place] [address]    Special Requests   Final     NONE Performed at [facility], [place] [address] [place], [place] [address]    Culture   Final    NO GROWTH Performed at [facility], [place] [address], [place], [place] [address]    Report Status 10/21/2020 FINAL  Final  Blood Culture (routine x 2)     Status: None (Preliminary result)   Collection Time: 10/20/20  4:00 AM   Specimen: BLOOD  Result Value Ref Range Status   Specimen Description   Final    BLOOD BLOOD RIGHT ARM Performed at [facility] [address], [place], [place] [address]    Special Requests   Final    BOTTLES DRAWN AEROBIC AND ANAEROBIC Blood Culture adequate volume Performed at [facility] [address], [place], [place] [address]    Culture   Final    NO GROWTH 3 DAYS Performed at [facility], [place] [address] [place], [place] [address]    Report Status PENDING  Incomplete  MRSA Next Gen by PCR, Nasal     Status: None   Collection Time: 10/20/20  7:35 AM   Specimen: Nasal Mucosa; Nasal Swab  Result Value Ref Range Status   MRSA by PCR Next Gen NOT DETECTED NOT DETECTED Final    Comment: (NOTE) The GeneXpert MRSA Assay (FDA approved for NASAL specimens only), is one component of a comprehensive MRSA colonization surveillance program. It is not intended to diagnose MRSA infection nor to guide or monitor treatment for MRSA infections. Test performance is not FDA approved in patients less than 5 years old. Performed at [facility], [place] [address] [place], [place] [address]   C Difficile Quick Screen w PCR reflex     Status: None   Collection Time: 10/20/20  4:58 PM   Specimen: STOOL  Result Value Ref Range Status   C Diff antigen NEGATIVE NEGATIVE Final   C Diff toxin NEGATIVE NEGATIVE Final   C Diff interpretation No C. difficile detected.  Final    Comment: Performed at [facility],  [place] [address], [place], [place] [address]          Radiology Studies: No results found.      Scheduled Meds:  amiodarone  200 mg Oral BID   chlorhexidine  15 mL Mouth Rinse BID   Chlorhexidine Gluconate Cloth  6 each Topical Daily   insulin aspart  0-9 Units Subcutaneous Q4H   insulin detemir  15 Units Subcutaneous BID   mouth rinse  15 mL Mouth Rinse BID   pneumococcal 23 valent vaccine  0.5 mL Intramuscular Tomorrow-1000   Continuous Infusions:  ceFEPime (MAXIPIME) IV Stopped (10/23/20 1012)   dextrose 5% lactated ringers 125 mL/hr at 10/23/20 1424   metronidazole Stopped (10/23/20 0858)     LOS: 3 days    Time spent: 36 minutes spent on chart review, discussion with nursing staff, consultants, updating family and interview/physical exam; more than 50% of that time was spent in counseling and/or coordination of care.    Dodge Ator J [place] DO [facility] Available via Epic secure chat 7am-7pm After these hours, please refer to coverage provider listed on [email or web address] 10/23/2020, 2:46 PM

## 2020-10-23 NOTE — Progress Notes (Signed)
Pharmacy Antibiotic Note  Mark Chang is a 75 y.o. male admitted on 10/19/2020 with  intra-abdominal infection .  Pharmacy has been consulted for Cefepime dosing. Scr acutely elevated with est CrCl <73m/min.  10/23/20 8:36 AM  Afebrile WBC 19.9 increased Cultures negative to date SCr improved to baseline  Plan: Increase cefepime dosing to 2 g iv q 8 hours Monitor renal function and cx data    Height: '5\' 8"'$  (172.7 cm) Weight: 122 kg (269 lb) IBW/kg (Calculated) : 68.4  Temp (24hrs), Avg:97.9 F (36.6 C), Min:97.7 F (36.5 C), Max:98.4 F (36.9 C)  Recent Labs  Lab 10/19/20 2029 10/19/20 2255 10/20/20 0027 10/20/20 0327 10/20/20 0355 10/21/20 0245 10/22/20 0248 10/23/20 0241  WBC 22.7*  --   --   --  20.0* 17.6* 15.4* 19.9*  CREATININE 3.92*  --   --   --  3.28* 2.24* 1.64* 1.27*  LATICACIDVEN 2.2* 1.5 1.3 0.9  --   --   --   --      Estimated Creatinine Clearance: 62.9 mL/min (A) (by C-G formula based on SCr of 1.27 mg/dL (H)).    No Known Allergies  Antimicrobials this admission: 7/23 Cefepime >>  7/23 Flagyl >>   Dose adjustments this admission:  Microbiology results: 7/23 BCx: ngtd 7/23 UCx:  ngf 7/24: C diff: negative  Thank you for allowing pharmacy to be a part of this patient's care.  CUlice DashD PharmD 10/23/2020 8:34 AM

## 2020-10-23 NOTE — Progress Notes (Signed)
CC: Abdominal pain  Subjective: Initially said he did not hurt this AM and then he was not quite sure whether it hurt or not.  His abdomen is soft he does not have peritonitis.  He does not appear uncomfortable.  Objective: Vital signs in last 24 hours: Temp:  [97.7 F (36.5 C)-98.4 F (36.9 C)] 98.4 F (36.9 C) (07/27 0746) Pulse Rate:  [63-91] 82 (07/27 0800) Resp:  [13-28] 20 (07/27 0800) BP: (137-185)/(38-82) 164/71 (07/27 0800) SpO2:  [93 %-97 %] 96 % (07/27 0800) Last BM Date: 10/22/20 4685 IV P.o. not recorded Urine 3200 Stool 200 Afebrile vital signs are stable heart rate converted to sinus rhythm.  Rate in the 80s. Potassium 3.3, sodium 152, chloride 127, glucose 15, creatinine 1.27 WBC 20.0>> 17.6>> 15.4>> 19.9 Intake/Output from previous day: 07/26 0701 - 07/27 0700 In: 4685 [I.V.:3044.6; IV Piggyback:1640.5] Out: 3400 [Urine:3200; Stool:200] Intake/Output this shift: No intake/output data recorded.  General appearance: alert, cooperative, no distress, and answers are slow he cannot tell me exactly where he is Resp: Clear anterior good sats on room air. GI: Soft, nontender not distended large abdomen, no peritonitis.  He is not complaining of any pain in any particular point this AM. Positive bowel sounds.  Lab Results:  Recent Labs    10/22/20 0248 10/23/20 0241  WBC 15.4* 19.9*  HGB 11.7* 10.9*  HCT 37.3* 34.2*  PLT 375 396    BMET Recent Labs    10/22/20 0248 10/22/20 1656 10/23/20 0241  NA 146*  --  152*  K 2.6* 3.1* 3.3*  CL 122*  --  127*  CO2 17*  --  16*  GLUCOSE 126*  --  159*  BUN 69*  --  44*  CREATININE 1.64*  --  1.27*  CALCIUM 8.2*  --  8.6*   PT/INR No results for input(s): LABPROT, INR in the last 72 hours.  Recent Labs  Lab 10/19/20 2029 10/20/20 0355 10/21/20 0245  AST 33 29 54*  ALT '26 25 30  '$ ALKPHOS 50 49 53  BILITOT 0.6 0.5 0.5  PROT 6.9 6.2* 6.1*  ALBUMIN 2.8* 2.7* 2.4*     Lipase  No results found  for: LIPASE   Medications:  amiodarone  200 mg Oral BID   chlorhexidine  15 mL Mouth Rinse BID   Chlorhexidine Gluconate Cloth  6 each Topical Daily   insulin aspart  0-9 Units Subcutaneous Q4H   insulin detemir  15 Units Subcutaneous BID   mouth rinse  15 mL Mouth Rinse BID   pneumococcal 23 valent vaccine  0.5 mL Intramuscular Tomorrow-1000   Anti-infectives (From admission, onward)    Start     Dose/Rate Route Frequency Ordered Stop   10/23/20 1000  ceFEPIme (MAXIPIME) 2 g in sodium chloride 0.9 % 100 mL IVPB        2 g 200 mL/hr over 30 Minutes Intravenous Every 8 hours 10/23/20 0833     10/21/20 1000  ceFEPIme (MAXIPIME) 2 g in sodium chloride 0.9 % 100 mL IVPB  Status:  Discontinued        2 g 200 mL/hr over 30 Minutes Intravenous Every 12 hours 10/21/20 0742 10/23/20 0833   10/21/20 0600  metroNIDAZOLE (FLAGYL) IVPB 500 mg        500 mg 100 mL/hr over 60 Minutes Intravenous Every 8 hours 10/20/20 0028     10/20/20 2200  ceFEPIme (MAXIPIME) 2 g in sodium chloride 0.9 % 100 mL IVPB  Status:  Discontinued        2 g 200 mL/hr over 30 Minutes Intravenous Every 24 hours 10/20/20 0048 10/21/20 0742   10/19/20 2030  ceFEPIme (MAXIPIME) 2 g in sodium chloride 0.9 % 100 mL IVPB        2 g 200 mL/hr over 30 Minutes Intravenous  Once 10/19/20 2029 10/20/20 0033   10/19/20 2030  metroNIDAZOLE (FLAGYL) IVPB 500 mg        500 mg 100 mL/hr over 60 Minutes Intravenous  Once 10/19/20 2029 10/19/20 2210        Assessment/Plan Sigmoid colon obstruction with pneumatosis  -Dr. Erlinda Hong opinion, GI service; is the that sigmoidoscopy is contraindicated in the setting of sepsis, ischemia, hypertension, pneumatosis and fever  -Unable to contact the brother; patient making slow progress  - Continue resuscitation and antibiotics for now.       FEN: N.p.o./IV fluids ID: Cefepime/Flagyl 7/23 >> day 5 DVT: SCDs added; can have chemical DVT prophylaxis from our standpoint   Sepsis Chest pain,  new onset atrial fibrillation with RVR  - CHA2DS2-VASc score:  4  will need  -Echo shows EF 45 to [id number], grade 2 diastolic dysfunction  - amiodarone drip >> PO amiodarone Hx intellectual disability Type 2 diabetes  - A1C 9.9 CKD stage III  -Creatinine 3.92>> 3.2>> 2.24>> 1.61>>1.27 Hypokalemia  - aim for K+ 4.0  Leukocytosis  - WBC 20.0>> 17.6>> 15.4>> 19.9 Morbid obesity BMI 40.9 Malnutrition -prealbumin pending  Plan: I attempted to call both numbers again this AM to contact his brother.  Again the home number called said call could not be completed as dialed.  No answer the second number which is the cell phone.  We will get a put him on full liquids and ask dietitian to see and help with supplements we will get a prealbumin on him tomorrow.  We need the case manager/social services to see what can be done about contacting his POA or another responsible guardian to assist with his care.  [place], Freidel [phone]        LOS: 3 days    Kj Imbert 10/23/2020 Please see Amion

## 2020-10-24 ENCOUNTER — Encounter (HOSPITAL_COMMUNITY)
Admission: EM | Disposition: A | Discharge status: Skilled Nursing Facility | Payer: Self-pay | Source: Home / Self Care | Attending: Internal Medicine

## 2020-10-24 ENCOUNTER — Inpatient Hospital Stay (HOSPITAL_COMMUNITY): Payer: Medicare HMO

## 2020-10-24 ENCOUNTER — Inpatient Hospital Stay (HOSPITAL_COMMUNITY): Payer: Medicare HMO | Admitting: Certified Registered"

## 2020-10-24 DIAGNOSIS — R6520 Severe sepsis without septic shock: Secondary | ICD-10-CM

## 2020-10-24 DIAGNOSIS — K6389 Other specified diseases of intestine: Secondary | ICD-10-CM

## 2020-10-24 DIAGNOSIS — J9600 Acute respiratory failure, unspecified whether with hypoxia or hypercapnia: Secondary | ICD-10-CM

## 2020-10-24 DIAGNOSIS — R198 Other specified symptoms and signs involving the digestive system and abdomen: Secondary | ICD-10-CM | POA: Diagnosis not present

## 2020-10-24 DIAGNOSIS — A419 Sepsis, unspecified organism: Principal | ICD-10-CM

## 2020-10-24 DIAGNOSIS — K56609 Unspecified intestinal obstruction, unspecified as to partial versus complete obstruction: Secondary | ICD-10-CM

## 2020-10-24 HISTORY — PX: COLON RESECTION SIGMOID: SHX6737

## 2020-10-24 LAB — BASIC METABOLIC PANEL
Anion gap: 6 (ref 5–15)
BUN: 29 mg/dL — ABNORMAL HIGH (ref 8–23)
CO2: 21 mmol/L — ABNORMAL LOW (ref 22–32)
Calcium: 7.1 mg/dL — ABNORMAL LOW (ref 8.9–10.3)
Chloride: 119 mmol/L — ABNORMAL HIGH (ref 98–111)
Creatinine, Ser: 1.22 mg/dL (ref 0.61–1.24)
GFR, Estimated: >60 mL/min (ref >60)
Glucose, Bld: 134 mg/dL — ABNORMAL HIGH (ref 70–99)
Potassium: 3 mmol/L — ABNORMAL LOW (ref 3.5–5.1)
Sodium: 146 mmol/L — ABNORMAL HIGH (ref 135–145)

## 2020-10-24 LAB — POCT I-STAT EG7
Acid-base deficit: 5 mmol/L — ABNORMAL HIGH (ref 0.0–2.0)
Acid-base deficit: 6 mmol/L — ABNORMAL HIGH (ref 0.0–2.0)
Bicarbonate: 21.8 mmol/L (ref 20.0–28.0)
Bicarbonate: 22.3 mmol/L (ref 20.0–28.0)
Calcium, Ion: 1.14 mmol/L — ABNORMAL LOW (ref 1.15–1.40)
Calcium, Ion: 1.14 mmol/L — ABNORMAL LOW (ref 1.15–1.40)
HCT: 24 % — ABNORMAL LOW (ref 39.0–52.0)
HCT: 26 % — ABNORMAL LOW (ref 39.0–52.0)
Hemoglobin: 8.2 g/dL — ABNORMAL LOW (ref 13.0–17.0)
Hemoglobin: 8.8 g/dL — ABNORMAL LOW (ref 13.0–17.0)
O2 Saturation: 57 %
O2 Saturation: 79 %
Patient temperature: 37
Potassium: 2.8 mmol/L — ABNORMAL LOW (ref 3.5–5.1)
Potassium: 2.9 mmol/L — ABNORMAL LOW (ref 3.5–5.1)
Sodium: 150 mmol/L — ABNORMAL HIGH (ref 135–145)
Sodium: 150 mmol/L — ABNORMAL HIGH (ref 135–145)
TCO2: 23 mmol/L (ref 22–32)
TCO2: 24 mmol/L (ref 22–32)
pCO2, Ven: 48.2 mmHg (ref 44.0–60.0)
pCO2, Ven: 58.9 mmHg (ref 44.0–60.0)
pH, Ven: 7.186 — CL (ref 7.250–7.430)
pH, Ven: 7.263 (ref 7.250–7.430)
pO2, Ven: 35 mmHg (ref 32.0–45.0)
pO2, Ven: 55 mmHg — ABNORMAL HIGH (ref 32.0–45.0)

## 2020-10-24 LAB — GLUCOSE, CAPILLARY
Glucose-Capillary: 95 mg/dL (ref 70–99)
Glucose-Capillary: 111 mg/dL — ABNORMAL HIGH (ref 70–99)
Glucose-Capillary: 185 mg/dL — ABNORMAL HIGH (ref 70–99)
Glucose-Capillary: 143 mg/dL — ABNORMAL HIGH (ref 70–99)
Glucose-Capillary: 122 mg/dL — ABNORMAL HIGH (ref 70–99)
Glucose-Capillary: 143 mg/dL — ABNORMAL HIGH (ref 70–99)

## 2020-10-24 LAB — COMPREHENSIVE METABOLIC PANEL
ALT: 30 U/L (ref 0–44)
AST: 35 U/L (ref 15–41)
Albumin: 2.1 g/dL — ABNORMAL LOW (ref 3.5–5.0)
Alkaline Phosphatase: 82 U/L (ref 38–126)
Anion gap: 5 (ref 5–15)
BUN: 30 mg/dL — ABNORMAL HIGH (ref 8–23)
CO2: 23 mmol/L (ref 22–32)
Calcium: 8 mg/dL — ABNORMAL LOW (ref 8.9–10.3)
Chloride: 122 mmol/L — ABNORMAL HIGH (ref 98–111)
Creatinine, Ser: 1.22 mg/dL (ref 0.61–1.24)
GFR, Estimated: >60 mL/min (ref >60)
Glucose, Bld: 104 mg/dL — ABNORMAL HIGH (ref 70–99)
Potassium: 2.9 mmol/L — ABNORMAL LOW (ref 3.5–5.1)
Sodium: 150 mmol/L — ABNORMAL HIGH (ref 135–145)
Total Bilirubin: 0.5 mg/dL (ref 0.3–1.2)
Total Protein: 6.3 g/dL — ABNORMAL LOW (ref 6.5–8.1)

## 2020-10-24 LAB — CBC
HCT: 35.5 % — ABNORMAL LOW (ref 39.0–52.0)
HCT: 29.8 % — ABNORMAL LOW (ref 39.0–52.0)
Hemoglobin: 11.4 g/dL — ABNORMAL LOW (ref 13.0–17.0)
Hemoglobin: 9.5 g/dL — ABNORMAL LOW (ref 13.0–17.0)
MCH: 28.6 pg (ref 26.0–34.0)
MCH: 28.6 pg (ref 26.0–34.0)
MCHC: 32.1 g/dL (ref 30.0–36.0)
MCHC: 31.9 g/dL (ref 30.0–36.0)
MCV: 89 fL (ref 80.0–100.0)
MCV: 89.8 fL (ref 80.0–100.0)
Platelets: 417 10*3/uL — ABNORMAL HIGH (ref 150–400)
Platelets: 276 10*3/uL (ref 150–400)
RBC: 3.99 MIL/uL — ABNORMAL LOW (ref 4.22–5.81)
RBC: 3.32 MIL/uL — ABNORMAL LOW (ref 4.22–5.81)
RDW: 15.3 % (ref 11.5–15.5)
RDW: 15.5 % (ref 11.5–15.5)
WBC: 28.7 10*3/uL — ABNORMAL HIGH (ref 4.0–10.5)
WBC: 11.5 10*3/uL — ABNORMAL HIGH (ref 4.0–10.5)
nRBC: 0.1 % (ref 0.0–0.2)
nRBC: 0 % (ref 0.0–0.2)

## 2020-10-24 LAB — BLOOD GAS, ARTERIAL
Acid-base deficit: 2.9 mmol/L — ABNORMAL HIGH (ref 0.0–2.0)
Bicarbonate: 21.3 mmol/L (ref 20.0–28.0)
O2 Saturation: 99.8 %
Patient temperature: 98.6
pCO2 arterial: 37.4 mmHg (ref 32.0–48.0)
pH, Arterial: 7.375 (ref 7.350–7.450)
pO2, Arterial: 269 mmHg — ABNORMAL HIGH (ref 83.0–108.0)

## 2020-10-24 LAB — CULTURE, BLOOD (ROUTINE X 2)
Culture: NO GROWTH
Special Requests: ADEQUATE

## 2020-10-24 LAB — TYPE AND SCREEN
ABO/RH(D): A POS
Antibody Screen: NEGATIVE

## 2020-10-24 LAB — PREALBUMIN: Prealbumin: 6.1 mg/dL — ABNORMAL LOW (ref 18–38)

## 2020-10-24 LAB — MAGNESIUM
Magnesium: 2 mg/dL (ref 1.7–2.4)
Magnesium: 1.4 mg/dL — ABNORMAL LOW (ref 1.7–2.4)

## 2020-10-24 LAB — ABO/RH: ABO/RH(D): A POS

## 2020-10-24 LAB — PHOSPHORUS: Phosphorus: 1.7 mg/dL — ABNORMAL LOW (ref 2.5–4.6)

## 2020-10-24 LAB — LACTIC ACID, PLASMA: Lactic Acid, Venous: 1.5 mmol/L (ref 0.5–1.9)

## 2020-10-24 SURGERY — COLECTOMY, SIGMOID, OPEN
Anesthesia: General

## 2020-10-24 MED ORDER — SODIUM CHLORIDE 0.9 % IV BOLUS
1000.0000 mL | Freq: Once | INTRAVENOUS | Status: AC
  Administered 2020-10-24: 1000 mL via INTRAVENOUS

## 2020-10-24 MED ORDER — MAGNESIUM SULFATE 2 GM/50ML IV SOLN
2.0000 g | Freq: Once | INTRAVENOUS | Status: AC
  Administered 2020-10-24: 2 g via INTRAVENOUS
  Filled 2020-10-24: qty 50

## 2020-10-24 MED ORDER — FENTANYL CITRATE (PF) 100 MCG/2ML IJ SOLN
25.0000 ug | Freq: Once | INTRAMUSCULAR | Status: DC
  Filled 2020-10-24: qty 2

## 2020-10-24 MED ORDER — EPINEPHRINE HCL 5 MG/250ML IV SOLN IN NS
INTRAVENOUS | Status: AC
  Filled 2020-10-24: qty 250

## 2020-10-24 MED ORDER — 0.9 % SODIUM CHLORIDE (POUR BTL) OPTIME
TOPICAL | Status: DC | PRN
  Administered 2020-10-24: 1000 mL
  Administered 2020-10-24: 5000 mL

## 2020-10-24 MED ORDER — ATROPINE SULFATE 1 MG/10ML IJ SOSY
1.0000 mg | PREFILLED_SYRINGE | Freq: Once | INTRAMUSCULAR | Status: AC
  Administered 2020-10-24: 1 mg via INTRAVENOUS

## 2020-10-24 MED ORDER — LIDOCAINE 2% (20 MG/ML) 5 ML SYRINGE
INTRAMUSCULAR | Status: AC
  Filled 2020-10-24: qty 5

## 2020-10-24 MED ORDER — EPINEPHRINE 1 MG/10ML IJ SOSY
1.0000 mg | PREFILLED_SYRINGE | Freq: Once | INTRAMUSCULAR | Status: AC
  Administered 2020-10-24: 1 mg via INTRAVENOUS

## 2020-10-24 MED ORDER — CARVEDILOL 6.25 MG PO TABS: 6.2500 mg | ORAL_TABLET | Freq: Two times a day (BID) | ORAL | Status: DC

## 2020-10-24 MED ORDER — ROCURONIUM BROMIDE 10 MG/ML (PF) SYRINGE
PREFILLED_SYRINGE | INTRAVENOUS | Status: AC
  Filled 2020-10-24: qty 10

## 2020-10-24 MED ORDER — SODIUM BICARBONATE 8.4 % IV SOLN
50.0000 meq | Freq: Once | INTRAVENOUS | Status: AC
  Administered 2020-10-24: 50 meq via INTRAVENOUS

## 2020-10-24 MED ORDER — PHENYLEPHRINE HCL-NACL 10-0.9 MG/250ML-% IV SOLN
INTRAVENOUS | Status: DC | PRN
  Administered 2020-10-24: 100 ug/min via INTRAVENOUS

## 2020-10-24 MED ORDER — ONDANSETRON HCL 4 MG/2ML IJ SOLN: 4.0000 mg | Freq: Four times a day (QID) | INTRAMUSCULAR | Status: DC | PRN

## 2020-10-24 MED ORDER — HYDRALAZINE HCL 25 MG PO TABS: 25.0000 mg | ORAL_TABLET | Freq: Four times a day (QID) | ORAL | Status: DC | PRN

## 2020-10-24 MED ORDER — ONDANSETRON HCL 4 MG/2ML IJ SOLN
INTRAMUSCULAR | Status: AC
  Filled 2020-10-24: qty 2

## 2020-10-24 MED ORDER — FENTANYL 2500MCG IN NS 250ML (10MCG/ML) PREMIX INFUSION: 25.0000 ug/h | INTRAVENOUS | Status: DC

## 2020-10-24 MED ORDER — METOPROLOL TARTRATE 5 MG/5ML IV SOLN: 2.5000 mg | Freq: Four times a day (QID) | INTRAVENOUS | Status: DC | PRN

## 2020-10-24 MED ORDER — POTASSIUM CHLORIDE CRYS ER 20 MEQ PO TBCR
40.0000 meq | EXTENDED_RELEASE_TABLET | ORAL | Status: DC
  Administered 2020-10-24: 40 meq via ORAL
  Filled 2020-10-24: qty 2

## 2020-10-24 MED ORDER — SODIUM BICARBONATE 8.4 % IV SOLN
INTRAVENOUS | Status: AC
  Filled 2020-10-24: qty 50

## 2020-10-24 MED ORDER — POTASSIUM PHOSPHATES 15 MMOLE/5ML IV SOLN
30.0000 mmol | Freq: Once | INTRAVENOUS | Status: AC
  Administered 2020-10-24: 30 mmol via INTRAVENOUS
  Filled 2020-10-24: qty 10

## 2020-10-24 MED ORDER — SUCCINYLCHOLINE CHLORIDE 200 MG/10ML IV SOSY
PREFILLED_SYRINGE | INTRAVENOUS | Status: DC | PRN
  Administered 2020-10-24: 100 mg via INTRAVENOUS

## 2020-10-24 MED ORDER — AMLODIPINE BESYLATE 5 MG PO TABS
5.0000 mg | ORAL_TABLET | Freq: Every day | ORAL | Status: DC
  Administered 2020-10-24: 5 mg via ORAL
  Filled 2020-10-24: qty 1

## 2020-10-24 MED ORDER — FENTANYL CITRATE (PF) 250 MCG/5ML IJ SOLN
INTRAMUSCULAR | Status: AC
  Filled 2020-10-24: qty 5

## 2020-10-24 MED ORDER — AMLODIPINE BESYLATE 5 MG PO TABS: 5.0000 mg | ORAL_TABLET | Freq: Every day | ORAL | Status: DC

## 2020-10-24 MED ORDER — SODIUM BICARBONATE 8.4 % IV SOLN
INTRAVENOUS | Status: DC | PRN
  Administered 2020-10-24: 25 mL via INTRAVENOUS

## 2020-10-24 MED ORDER — ROCURONIUM BROMIDE 10 MG/ML (PF) SYRINGE
PREFILLED_SYRINGE | INTRAVENOUS | Status: DC | PRN
  Administered 2020-10-24: 30 mg via INTRAVENOUS
  Administered 2020-10-24: 20 mg via INTRAVENOUS
  Administered 2020-10-24: 40 mg via INTRAVENOUS

## 2020-10-24 MED ORDER — DOCUSATE SODIUM 50 MG/5ML PO LIQD
100.0000 mg | Freq: Two times a day (BID) | ORAL | Status: DC
  Administered 2020-10-25: 100 mg
  Filled 2020-10-24 (×2): qty 10

## 2020-10-24 MED ORDER — AMIODARONE HCL IN DEXTROSE 360-4.14 MG/200ML-% IV SOLN
30.0000 mg/h | INTRAVENOUS | Status: DC
  Administered 2020-10-24 – 2020-10-27 (×6): 30 mg/h via INTRAVENOUS
  Filled 2020-10-24 (×6): qty 200

## 2020-10-24 MED ORDER — PHENYLEPHRINE HCL (PRESSORS) 10 MG/ML IV SOLN
INTRAVENOUS | Status: AC
  Filled 2020-10-24: qty 1

## 2020-10-24 MED ORDER — PROPOFOL 10 MG/ML IV BOLUS
INTRAVENOUS | Status: AC
  Filled 2020-10-24: qty 20

## 2020-10-24 MED ORDER — AMIODARONE HCL 200 MG PO TABS: 200.0000 mg | ORAL_TABLET | Freq: Two times a day (BID) | ORAL | Status: DC

## 2020-10-24 MED ORDER — FENTANYL BOLUS VIA INFUSION
25.0000 ug | INTRAVENOUS | Status: DC | PRN
  Filled 2020-10-24: qty 100

## 2020-10-24 MED ORDER — LIDOCAINE 2% (20 MG/ML) 5 ML SYRINGE
INTRAMUSCULAR | Status: DC | PRN
Start: 2020-10-24 — End: 2020-10-24
  Administered 2020-10-24: 100 mg via INTRAVENOUS

## 2020-10-24 MED ORDER — ACETAMINOPHEN 10 MG/ML IV SOLN
INTRAVENOUS | Status: AC
  Filled 2020-10-24: qty 100

## 2020-10-24 MED ORDER — METHOCARBAMOL 1000 MG/10ML IJ SOLN
500.0000 mg | Freq: Three times a day (TID) | INTRAVENOUS | Status: DC
  Administered 2020-10-24 – 2020-10-26 (×5): 500 mg via INTRAVENOUS
  Filled 2020-10-24: qty 500
  Filled 2020-10-24: qty 5
  Filled 2020-10-24 (×2): qty 500
  Filled 2020-10-24: qty 5
  Filled 2020-10-24: qty 500

## 2020-10-24 MED ORDER — EPHEDRINE SULFATE-NACL 50-0.9 MG/10ML-% IV SOSY
PREFILLED_SYRINGE | INTRAVENOUS | Status: DC | PRN
  Administered 2020-10-24: 10 mg via INTRAVENOUS
  Administered 2020-10-24 (×2): 5 mg via INTRAVENOUS

## 2020-10-24 MED ORDER — FENTANYL CITRATE (PF) 100 MCG/2ML IJ SOLN
INTRAMUSCULAR | Status: DC | PRN
  Administered 2020-10-24: 50 ug via INTRAVENOUS

## 2020-10-24 MED ORDER — LACTATED RINGERS IV SOLN: INTRAVENOUS | Status: DC

## 2020-10-24 MED ORDER — AMIODARONE IV BOLUS ONLY 150 MG/100ML
150.0000 mg | Freq: Once | INTRAVENOUS | Status: AC
  Administered 2020-10-24: 150 mg via INTRAVENOUS

## 2020-10-24 MED ORDER — POTASSIUM CHLORIDE 10 MEQ/100ML IV SOLN
10.0000 meq | INTRAVENOUS | Status: AC
  Administered 2020-10-24 (×6): 10 meq via INTRAVENOUS
  Filled 2020-10-24 (×6): qty 100

## 2020-10-24 MED ORDER — NOREPINEPHRINE 4 MG/250ML-% IV SOLN
0.0000 ug/min | INTRAVENOUS | Status: DC
  Administered 2020-10-24: 4 ug/min via INTRAVENOUS
  Administered 2020-10-25: 11 ug/min via INTRAVENOUS
  Administered 2020-10-25: 9 ug/min via INTRAVENOUS
  Filled 2020-10-24 (×2): qty 250

## 2020-10-24 MED ORDER — ONDANSETRON HCL 4 MG/2ML IJ SOLN
INTRAMUSCULAR | Status: DC | PRN
  Administered 2020-10-24: 4 mg via INTRAVENOUS

## 2020-10-24 MED ORDER — ORAL CARE MOUTH RINSE
15.0000 mL | OROMUCOSAL | Status: DC
  Administered 2020-10-24 – 2020-10-25 (×6): 15 mL via OROMUCOSAL

## 2020-10-24 MED ORDER — PANTOPRAZOLE SODIUM 40 MG IV SOLR
40.0000 mg | Freq: Every day | INTRAVENOUS | Status: DC
  Administered 2020-10-24 – 2020-10-25 (×2): 40 mg via INTRAVENOUS
  Filled 2020-10-24 (×3): qty 40

## 2020-10-24 MED ORDER — ALBUMIN HUMAN 5 % IV SOLN: INTRAVENOUS | Status: DC | PRN

## 2020-10-24 MED ORDER — POTASSIUM CHLORIDE 2 MEQ/ML IV SOLN
INTRAVENOUS | Status: AC
  Filled 2020-10-24 (×5): qty 1000

## 2020-10-24 MED ORDER — PROPOFOL 500 MG/50ML IV EMUL
INTRAVENOUS | Status: DC | PRN
  Administered 2020-10-24: 80 ug/kg/min via INTRAVENOUS

## 2020-10-24 MED ORDER — ACETAMINOPHEN 10 MG/ML IV SOLN
1000.0000 mg | Freq: Four times a day (QID) | INTRAVENOUS | Status: AC
  Administered 2020-10-24 – 2020-10-25 (×4): 1000 mg via INTRAVENOUS
  Filled 2020-10-24 (×4): qty 100

## 2020-10-24 MED ORDER — PROPOFOL 1000 MG/100ML IV EMUL
0.0000 ug/kg/min | INTRAVENOUS | Status: DC
  Administered 2020-10-24: 5 ug/kg/min via INTRAVENOUS
  Administered 2020-10-25: 15 ug/kg/min via INTRAVENOUS
  Administered 2020-10-25: 25 ug/kg/min via INTRAVENOUS
  Filled 2020-10-24 (×3): qty 100

## 2020-10-24 MED ORDER — LACTATED RINGERS IV SOLN: INTRAVENOUS | Status: DC | PRN

## 2020-10-24 MED ORDER — CHLORHEXIDINE GLUCONATE 0.12% ORAL RINSE (MEDLINE KIT)
15.0000 mL | Freq: Two times a day (BID) | OROMUCOSAL | Status: DC
  Administered 2020-10-24 – 2020-10-25 (×2): 15 mL via OROMUCOSAL

## 2020-10-24 MED ORDER — POLYETHYLENE GLYCOL 3350 17 G PO PACK
17.0000 g | PACK | Freq: Every day | ORAL | Status: DC
  Administered 2020-10-26: 17 g
  Filled 2020-10-24: qty 1

## 2020-10-24 MED ORDER — SUCCINYLCHOLINE CHLORIDE 200 MG/10ML IV SOSY
PREFILLED_SYRINGE | INTRAVENOUS | Status: AC
  Filled 2020-10-24: qty 10

## 2020-10-24 MED ORDER — PROPOFOL 10 MG/ML IV BOLUS
INTRAVENOUS | Status: DC | PRN
  Administered 2020-10-24: 120 mg via INTRAVENOUS

## 2020-10-24 MED ORDER — ONDANSETRON HCL 4 MG PO TABS: 4.0000 mg | ORAL_TABLET | Freq: Four times a day (QID) | ORAL | Status: DC | PRN

## 2020-10-24 MED ORDER — MIDAZOLAM HCL 2 MG/2ML IJ SOLN
INTRAMUSCULAR | Status: AC
  Filled 2020-10-24: qty 2

## 2020-10-24 MED ORDER — NOREPINEPHRINE 4 MG/250ML-% IV SOLN
INTRAVENOUS | Status: DC | PRN
Start: 2020-10-24 — End: 2020-10-24
  Administered 2020-10-24: 2 ug/min via INTRAVENOUS

## 2020-10-24 MED FILL — Medication: Qty: 1 | Status: AC

## 2020-10-24 SURGICAL SUPPLY — 65 items
APL PRP STRL LF DISP 70% ISPRP (MISCELLANEOUS) ×1
APPLIER CLIP 5 13 M/L LIGAMAX5 (MISCELLANEOUS)
APPLIER CLIP ROT 10 11.4 M/L (STAPLE)
APR CLP MED LRG 11.4X10 (STAPLE)
APR CLP MED LRG 5 ANG JAW (MISCELLANEOUS)
BAG COUNTER SPONGE SURGICOUNT (BAG) ×2 IMPLANT
BAG SPNG CNTER NS LX DISP (BAG) ×2
BARRIER SKIN 2 3/4 (OSTOMY) ×2 IMPLANT
BARRIER SKIN OD2.25 2 3/4 FLNG (OSTOMY) IMPLANT
BLADE EXTENDED COATED 6.5IN (ELECTRODE) IMPLANT
BRR SKN FLT 2.75X2.25 2 PC (OSTOMY) ×1
CHLORAPREP W/TINT 26 (MISCELLANEOUS) ×2 IMPLANT
CLIP APPLIE 5 13 M/L LIGAMAX5 (MISCELLANEOUS) IMPLANT
CLIP APPLIE ROT 10 11.4 M/L (STAPLE) IMPLANT
COUNTER NEEDLE 20 DBL MAG RED (NEEDLE) ×2 IMPLANT
COVER MAYO STAND STRL (DRAPES) ×6 IMPLANT
COVER SURGICAL LIGHT HANDLE (MISCELLANEOUS) ×3 IMPLANT
DECANTER SPIKE VIAL GLASS SM (MISCELLANEOUS) ×2 IMPLANT
DRAIN CHANNEL 19F RND (DRAIN) ×1 IMPLANT
DRAIN PENROSE 0.5X18 (DRAIN) ×1 IMPLANT
DRAPE LAPAROSCOPIC ABDOMINAL (DRAPES) ×2 IMPLANT
DRSG OPSITE POSTOP 4X10 (GAUZE/BANDAGES/DRESSINGS) ×1 IMPLANT
DRSG OPSITE POSTOP 4X6 (GAUZE/BANDAGES/DRESSINGS) IMPLANT
DRSG OPSITE POSTOP 4X8 (GAUZE/BANDAGES/DRESSINGS) IMPLANT
ELECT PENCIL ROCKER SW 15FT (MISCELLANEOUS) ×1 IMPLANT
ELECT REM PT RETURN 15FT ADLT (MISCELLANEOUS) ×2 IMPLANT
EVACUATOR SILICONE 100CC (DRAIN) ×1 IMPLANT
GAUZE SPONGE 4X4 12PLY STRL (GAUZE/BANDAGES/DRESSINGS) ×1 IMPLANT
GLOVE SURG ENC TEXT LTX SZ8 (GLOVE) ×4 IMPLANT
GOWN STRL REUS W/TWL XL LVL3 (GOWN DISPOSABLE) ×12 IMPLANT
KIT TURNOVER KIT A (KITS) ×2 IMPLANT
LEGGING LITHOTOMY PAIR STRL (DRAPES) ×1 IMPLANT
LIGASURE IMPACT 36 18CM CVD LR (INSTRUMENTS) ×1 IMPLANT
PACK COLON (CUSTOM PROCEDURE TRAY) ×2 IMPLANT
PAD POSITIONING PINK XL (MISCELLANEOUS) ×1 IMPLANT
PENCIL SMOKE EVACUATOR (MISCELLANEOUS) IMPLANT
PROTECTOR NERVE ULNAR (MISCELLANEOUS) ×4 IMPLANT
RELOAD PROXIMATE 75MM BLUE (ENDOMECHANICALS) ×8 IMPLANT
RELOAD STAPLE 75 3.8 BLU REG (ENDOMECHANICALS) IMPLANT
SPONGE DRAIN TRACH 4X4 STRL 2S (GAUZE/BANDAGES/DRESSINGS) ×1 IMPLANT
SPONGE T-LAP 18X18 ~~LOC~~+RFID (SPONGE) ×7 IMPLANT
STAPLER CVD CUT GN 40 RELOAD (ENDOMECHANICALS) ×2 IMPLANT
STAPLER CVD CUT GRN 40 RELOAD (ENDOMECHANICALS) IMPLANT
STAPLER PROXIMATE 75MM BLUE (STAPLE) ×1 IMPLANT
STAPLER VISISTAT 35W (STAPLE) ×2 IMPLANT
SUT CHROMIC 3 0 SH 27 (SUTURE) IMPLANT
SUT ETHILON 2 0 PS N (SUTURE) ×1 IMPLANT
SUT PDS AB 1 CTX 36 (SUTURE) IMPLANT
SUT PDS AB 1 TP1 96 (SUTURE) IMPLANT
SUT PDS AB 4-0 SH 27 (SUTURE) IMPLANT
SUT PROLENE 2 0 KS (SUTURE) IMPLANT
SUT SILK 2 0 (SUTURE) ×2
SUT SILK 2 0 SH CR/8 (SUTURE) ×2 IMPLANT
SUT SILK 2-0 18XBRD TIE 12 (SUTURE) ×1 IMPLANT
SUT SILK 3 0 (SUTURE) ×2
SUT SILK 3 0 SH CR/8 (SUTURE) ×2 IMPLANT
SUT SILK 3-0 18XBRD TIE 12 (SUTURE) ×1 IMPLANT
SUT STRAFIX SYMMETRIC 0-0 24 (SUTURE) ×4
SUT VIC AB 2-0 SH 18 (SUTURE) ×3 IMPLANT
SUT VIC AB 4-0 SH 18 (SUTURE) ×2 IMPLANT
SUTURE STRAFIX SYMMETRC 0-0 24 (SUTURE) IMPLANT
TAPE PAPER 1X10 WHT MICROPORE (GAUZE/BANDAGES/DRESSINGS) ×1 IMPLANT
TOWEL OR NON WOVEN STRL DISP B (DISPOSABLE) ×2 IMPLANT
TRAY FOLEY MTR SLVR 16FR STAT (SET/KITS/TRAYS/PACK) ×1 IMPLANT
TUBING CONNECTING 10 (TUBING) ×1 IMPLANT

## 2020-10-24 NOTE — TOC Progression Note (Addendum)
Transition of Care [facility]) - Progression Note    Patient Details  Name: Mark Chang MRN: [id number] Date of Birth: 01-Jan-1946  [facility] of [facility] CM/SW Contact  Leeroy Cha, RN Phone Number: 10/24/2020, 8:44 AM  Clinical Narrative:     Jearl Klinefelter in [place]  with the same numbers has listed  called [phone] and left request on voice mail to please call me asap. Women called back from the above number this is no longer the [facility] phone number. Expected Discharge Plan: Home/Self Care Barriers to Discharge: Continued Medical Work up  Expected Discharge Plan and Services Expected Discharge Plan: Home/Self Care   Discharge Planning Services: CM Consult   Living arrangements for the past 2 months: Single Family Home                                       Social Determinants of Health (SDOH) Interventions    Readmission Risk Interventions No flowsheet data found.

## 2020-10-24 NOTE — Anesthesia Postprocedure Evaluation (Signed)
Anesthesia Post Note  Patient: Mark Chang  Procedure(s) Performed: EXPLORATORY LAPAROTOMY, RIGHT COLECTOMY WITH ANASTOMOSIS. LEFT COLECTOMY WITH COLOSTOMY, MOBILIZATION OF SPLENIC FLEXURE     Patient location during evaluation: SICU Anesthesia Type: General Level of consciousness: sedated Pain management: pain level controlled Vital Signs Assessment: post-procedure vital signs reviewed and stable Respiratory status: patient remains intubated per anesthesia plan Cardiovascular status: stable Postop Assessment: no apparent nausea or vomiting Anesthetic complications: no   No notable events documented.  Last Vitals:  Vitals:   10/24/20 1631 10/24/20 1648  BP:    Pulse:    Resp:    Temp:  37.1 C  SpO2: 100%     Last Pain:  Vitals:   10/24/20 1648  TempSrc: Axillary  PainSc:                  Barnet Glasgow

## 2020-10-24 NOTE — Progress Notes (Signed)
Patient has POA that has been unreachable.  Surgery declared an emergency and is proceeding with a surgeons signature.

## 2020-10-24 NOTE — Progress Notes (Signed)
Mr. Mcgee was started on a liquid diet and has been telling us his abdominal pain has been feeling better, and he has been having some bowel movements.  His abdominal exam has remained benign.  However his white count was noted to be rising, so plain films of the abdomen were obtained this morning.  Chest x-ray demonstrates free air under the right diaphragm.  At this point it appears his cecum has likely perforated and he requires emergent surgery.  We have attempted on multiple occasions to contact family, however after discussing the patient's case with the facility that cares for him it appears he has been abandoned by his power of attorney.  The social work team has assisted [place] in attempting to contact family, we have engaged the ethics team in the patient's case as well.  At this point he has a surgical emergency, so we will proceed to the operating room for exploratory laparotomy with likely sigmoid colectomy with end colostomy emergently, as the patient has developed free air.   Felicie Morn, MD General, Bariatric and Minimally Invasive Surgery [facility], [place]

## 2020-10-24 NOTE — Progress Notes (Signed)
PROGRESS NOTE    THELMER Chang  [id number] DOB: 17-Jul-1945 DOA: 10/19/2020 PCP: Terrill Mohr, NP    Brief Narrative:  Mark Chang is a 75 year old male with past medical history significant for type 2 diabetes mellitus, essential hypertension, CKD stage IIIa, intellectual disability who initially presented to [facility] ED with complaint of abdominal pain, diarrhea and was found to be hypotensive.  Per SNF staff, he reported abdominal pain for the last few weeks.    In the ED, he was found to have a blood pressure of 68/40, temperature 100.5 F, HR 105, lactic acid 2.2, WBC count 22.7.  CT abdomen/pelvis showed minimal amount of free air, cecal mass probably sigmoid colon with concern for perforated viscus.  Gastroenterology and general surgery were consulted.  TRH consulted for further evaluation and management of abdominal pain secondary to cecal mass with likely perforated viscus.   Assessment & Plan:   Principal Problem:   Perforated abdominal viscus Active Problems:   Intellectual disability   AKI (acute kidney injury) ([place])   Severe sepsis with acute organ dysfunction (HCC)   DM (diabetes mellitus), type 2 with renal complications (HCC)   CKD (chronic kidney disease), stage III (HCC)   Mass of cecum   Pneumatosis coli   Septic shock in the setting of perforated abdominal viscus Patient presenting from SNF with abdominal pain apparently going on for several weeks.  Patient was febrile, tachycardic, tachypneic, with hypotension, elevated lactic acid and elevated WBC count with source being intra-abdominal infection.  On imaging was found to have a cecal mass and pneumatosis.  Patient was seen by gastroenterology, Dr. Paulita Fujita who did not recommend endoscopic procedure given fever, hypotension, leukocytosis. --General surgery following, appreciate assistance --WBC 22.7>>15.4>19.9>28.7 --Cefepime 2 g IV every 8 hours --Metronidazole 5 mg IV every 8 hours --General surgery taking  patient to the OR emergently this morning for free air noted under the diaphragm concerning for bowel perforation, likely cecum.  Proximal atrial fibrillation with RVR --Cardiology now signed off --Amiodarone 200 mg p.o. twice daily --Coreg 6.25 mg p.o. twice daily --Continue monitor on telemetry  Acute renal failure on CKD stage IIIa --Cr 3.92>>2.24>1.61>1.27>1.22  Essential hypertension --Increase Coreg to 6.25 mg p.o. BID --start amlodipine '5mg'$  PO daily --Hydralazine 25 mg PO q6h prn SBP >180 or DBP >110  Type 2 diabetes mellitus --Levemir 15 units [place] BID --sensitive SSI for further coverage --CBGs qAC/HS  Hypokalemia Potassium 2.9, will replete today.  Magnesium 2.0. --Repeat electrolytes in the a.m.  Hypophosphatemia --Phosphorus 1.7, will replete with 30 mmol K-Phos IV --Repeat phosphorus level in a.m.  Hyponatremia Hyperchloremia --Continue D5 bicarb drip at 75 mL/h --Repeat BMP in a.m.   DVT prophylaxis: Place and maintain sequential compression device Start: 10/22/20 0852 SCDs Start: 10/20/20 0026   Code Status: Full Code Family Communication: No family present at bedside, unable to reach POA brother via telephone  Disposition Plan:  Level of care: Stepdown Status is: Inpatient  Remains inpatient appropriate because:Persistent severe electrolyte disturbances, Ongoing active pain requiring inpatient pain management, Ongoing diagnostic testing needed not appropriate for outpatient work up, Unsafe d/c plan, IV treatments appropriate due to intensity of illness or inability to take PO, and Inpatient level of care appropriate due to severity of illness  Dispo: The patient is from: SNF              Anticipated d/c is to: SNF              Patient currently is  not medically stable to d/c.   Difficult to place patient No   Consultants:  [facility], Dr. 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  Feels "not well".  Denies any significant abdominal pain.  Acute abdominal series ordered this morning by general surgery notable for free air under diaphragm and being taken emergently to the OR.  RN present at bedside this morning and patient remains pleasantly confused which is not unchanged over the last several days.  Unable to obtain much ROS due to his underlying intellectual disability.  No acute events overnight per nursing staff.  Staff still unable to contact patient's POA, brother.  Objective: Vitals:   10/24/20 0756 10/24/20 0800 10/24/20 1000 10/24/20 1027  BP: (!) 170/55 (!) 185/150 (!) 150/65 (!) 169/71  Pulse:  83 (!) 101 100  Resp:   (!) 28 18  Temp:  98.5 F (36.9 C)  98.8 F (37.1 C)  TempSrc:  Oral  Oral  SpO2:  92% 96% 97%  Weight:      Height:        Intake/Output Summary (Last 24 hours) at 10/24/2020 1038 Last data filed at 10/24/2020 1027 Gross per 24 hour  Intake 3877.37 ml  Output 3730 ml  Net 147.37 ml   Filed Weights   10/19/20 2019  Weight: 122 kg    Examination:  General exam: Appears calm and comfortable, pleasantly confused Respiratory system: Clear to auscultation. Respiratory effort normal.  On room air Cardiovascular system: S1 & S2 heard, RRR. No JVD, murmurs, rubs, gallops or clicks. No pedal edema. Gastrointestinal system: Abdomen is nondistended, soft and nontender. No organomegaly or masses felt. Normal bowel sounds heard. Central nervous system: Alert, not oriented to person/place/time or situation. No focal neurological deficits. Extremities: Symmetric 5 x 5 power. Skin: No rashes, lesions or ulcers Psychiatry: Judgement and insight appear poor. Mood & affect appropriate.     Data Reviewed: I have personally reviewed following labs and imaging studies  CBC: Recent Labs  Lab 10/19/20 2029  10/20/20 0355 10/21/20 0245 10/22/20 0248 10/23/20 0241 10/24/20 0308  WBC 22.7* 20.0* 17.6* 15.4* 19.9* 28.7*  NEUTROABS 18.6*  --   --   --   --   --   HGB 11.5* 10.9* 11.3* 11.7* 10.9* 11.4*  HCT 36.0* 33.4* 34.5* 37.3* 34.2* 35.5*  MCV 89.8 88.4 88.2 90.3 87.7 89.0  PLT 384 344 369 375 396 [id number]*   Basic Metabolic Panel: Recent Labs  Lab 10/20/20 0355 10/21/20 0245 10/21/20 2153 10/22/20 0248 10/22/20 1656 10/23/20 0241 10/24/20 0308  NA 134* 139  --  146*  --  152* 150*  K 3.4* 2.6* 2.5* 2.6* 3.1* 3.3* 2.9*  CL 106 112*  --  122*  --  127* 122*  CO2 15* 16*  --  17*  --  16* 23  GLUCOSE 147* 67*  --  126*  --  159* 104*  BUN 110* 91*  --  69*  --  44* 30*  CREATININE 3.28* 2.24*  --  1.64*  --  1.27* 1.22  CALCIUM 8.1* 8.1*  --  8.2*  --  8.6* 8.0*  MG  --  2.6*  --  2.6*  --  2.3 2.0  PHOS  --  3.8  --  2.5  --  1.5* 1.7*   GFR: Estimated Creatinine Clearance:  65.4 mL/min (by C-G formula based on SCr of 1.22 mg/dL). Liver Function Tests: Recent Labs  Lab 10/19/20 2029 10/20/20 0355 10/21/20 0245 10/24/20 0308  AST 33 29 54* 35  ALT '26 25 30 30  '$ ALKPHOS 50 49 53 82  BILITOT 0.6 0.5 0.5 0.5  PROT 6.9 6.2* 6.1* 6.3*  ALBUMIN 2.8* 2.7* 2.4* 2.1*   No results for input(s): LIPASE, AMYLASE in the last 168 hours. No results for input(s): AMMONIA in the last 168 hours. Coagulation Profile: Recent Labs  Lab 10/19/20 2029 10/20/20 0355  INR 1.3* 1.2   Cardiac Enzymes: No results for input(s): CKTOTAL, CKMB, CKMBINDEX, TROPONINI in the last 168 hours. BNP (last 3 results) No results for input(s): PROBNP in the last 8760 hours. HbA1C: No results for input(s): HGBA1C in the last 72 hours. CBG: Recent Labs  Lab 10/23/20 1601 10/23/20 2012 10/23/20 2339 10/24/20 0348 10/24/20 0729  GLUCAP 186* 177* 162* 95 111*   Lipid Profile: No results for input(s): CHOL, HDL, LDLCALC, TRIG, CHOLHDL, LDLDIRECT in the last 72 hours. Thyroid Function Tests: No  results for input(s): TSH, T4TOTAL, FREET4, T3FREE, THYROIDAB in the last 72 hours. Anemia Panel: No results for input(s): VITAMINB12, FOLATE, FERRITIN, TIBC, IRON, RETICCTPCT in the last 72 hours. Sepsis Labs: Recent Labs  Lab 10/19/20 2029 10/19/20 2255 10/20/20 0027 10/20/20 0327 10/20/20 0355  PROCALCITON  --   --   --   --  6.58  LATICACIDVEN 2.2* 1.5 1.3 0.9  --     Recent Results (from the past 240 hour(s))  Resp Panel by RT-PCR (Flu A&B, Covid) Nasopharyngeal Swab     Status: None   Collection Time: 10/19/20  8:29 PM   Specimen: Nasopharyngeal Swab; Nasopharyngeal(NP) swabs in vial transport medium  Result Value Ref Range Status   SARS Coronavirus 2 by RT PCR NEGATIVE NEGATIVE Final    Comment: (NOTE) SARS-CoV-2 target nucleic acids are NOT DETECTED.  The SARS-CoV-2 RNA is generally detectable in upper respiratory specimens during the acute phase of infection. The lowest concentration of SARS-CoV-2 viral copies this assay can detect is 138 copies/mL. A negative result does not preclude SARS-Cov-2 infection and should not be used as the sole basis for treatment or other patient management decisions. A negative result may occur with  improper specimen collection/handling, submission of specimen other than nasopharyngeal swab, presence of viral mutation(s) within the areas targeted by this assay, and inadequate number of viral copies(<138 copies/mL). A negative result must be combined with clinical observations, patient history, and epidemiological information. The expected result is Negative.  Fact Sheet for Patients:  EntrepreneurPulse.com.au  Fact Sheet for Healthcare Providers:  IncredibleEmployment.be  This test is no t yet approved or cleared by the [place] FDA and  has been authorized for detection and/or diagnosis of SARS-CoV-2 by FDA under an Emergency Use Authorization (EUA). This EUA will remain  in effect (meaning  this test can be used) for the duration of the COVID-19 declaration under Section 564(b)(1) of the Act, 21 U.S.C.section 360bbb-3(b)(1), unless the authorization is terminated  or revoked sooner.       Influenza A by PCR NEGATIVE NEGATIVE Final   Influenza B by PCR NEGATIVE NEGATIVE Final    Comment: (NOTE) The Xpert Xpress SARS-CoV-2/FLU/RSV plus assay is intended as an aid in the diagnosis of influenza from Nasopharyngeal swab specimens and should not be used as a sole basis for treatment. Nasal washings and aspirates are unacceptable for Xpert Xpress SARS-CoV-2/FLU/RSV testing.  Fact Sheet  for Patients: EntrepreneurPulse.com.au  Fact Sheet for Healthcare Providers: IncredibleEmployment.be  This test is not yet approved or cleared by the [place] FDA and has been authorized for detection and/or diagnosis of SARS-CoV-2 by FDA under an Emergency Use Authorization (EUA). This EUA will remain in effect (meaning this test can be used) for the duration of the COVID-19 declaration under Section 564(b)(1) of the Act, 21 U.S.C. section 360bbb-3(b)(1), unless the authorization is terminated or revoked.  Performed at [facility], [place] [address] [place], [place] [address]   Blood Culture (routine x 2)     Status: None (Preliminary result)   Collection Time: 10/19/20  8:29 PM   Specimen: BLOOD  Result Value Ref Range Status   Specimen Description   Final    BLOOD BLOOD LEFT ARM Performed at [facility] [address] [place], [place] [address]    Special Requests   Final    BOTTLES DRAWN AEROBIC AND ANAEROBIC Blood Culture adequate volume Performed at [facility] [address], [place], [place] [address]    Culture   Final    NO GROWTH 4 DAYS Performed at [facility], [place] [address] [place], [place] [address]    Report Status PENDING  Incomplete  Urine Culture      Status: None   Collection Time: 10/19/20  8:29 PM   Specimen: In/Out Cath Urine  Result Value Ref Range Status   Specimen Description   Final    IN/OUT CATH URINE Performed at [facility] [place] [address] [place], [place] [address]    Special Requests   Final    NONE Performed at [facility], [place] [address] [place], [place] [address]    Culture   Final    NO GROWTH Performed at [facility], [place] [address] [place], [place] [address]    Report Status 10/21/2020 FINAL  Final  Blood Culture (routine x 2)     Status: None (Preliminary result)   Collection Time: 10/20/20  4:00 AM   Specimen: BLOOD  Result Value Ref Range Status   Specimen Description   Final    BLOOD BLOOD RIGHT ARM Performed at [facility] [address], [place], [place] [address]    Special Requests   Final    BOTTLES DRAWN AEROBIC AND ANAEROBIC Blood Culture adequate volume Performed at [facility] [address], [place], [place] [address]    Culture   Final    NO GROWTH 3 DAYS Performed at [facility], [address], [place], [place] [address]    Report Status PENDING  Incomplete  MRSA Next Gen by PCR, Nasal     Status: None   Collection Time: 10/20/20  7:35 AM   Specimen: Nasal Mucosa; Nasal Swab  Result Value Ref Range Status   MRSA by PCR Next Gen NOT DETECTED NOT DETECTED Final    Comment: (NOTE) The GeneXpert MRSA Assay (FDA approved for NASAL specimens only), is one component of a comprehensive MRSA colonization surveillance program. It is not intended to diagnose MRSA infection nor to guide or monitor treatment for MRSA infections. Test performance is not FDA approved in patients less than 35 years old. Performed at [facility], [place] [address], [place], [place] [address]   C Difficile Quick Screen w PCR reflex     Status: None   Collection Time: 10/20/20  4:58 PM  Specimen:  STOOL  Result Value Ref Range Status   C Diff antigen NEGATIVE NEGATIVE Final   C Diff toxin NEGATIVE NEGATIVE Final   C Diff interpretation No C. difficile detected.  Final    Comment: Performed at [facility], [place] [address], [place], [place] [address]         Radiology Studies: [facility] Chest [place] 1 View  Addendum Date: 10/24/2020   ADDENDUM REPORT: 10/24/2020 10:05 ADDENDUM: Free air discussed by telephone with Dr. Eddie Dibbles STECHSCHULTE on 10/24/2020 at 1000 hours. Electronically Signed   By: Genevie Ann M.D.   On: 10/24/2020 10:05   Result Date: 10/24/2020 CLINICAL DATA:  75 year old male with distal colonic obstruction due to mass. EXAM: PORTABLE CHEST 1 VIEW COMPARISON:  Supine abdominal radiographs today. CT Abdomen and Pelvis 10/19/2020. FINDINGS: Portable AP upright view at 0922 hours. There is evidence of free air under the right hemidiaphragm over the liver dome, new compared to chest radiographs and CT 10/19/2020. Continued low lung volumes. Stable ventilation. Stable cardiac size and mediastinal contours. Ongoing gas distended bowel in the left upper abdomen. IMPRESSION: Positive for pneumoperitoneum under the right hemidiaphragm. Electronically Signed: By: Genevie Ann M.D. On: 10/24/2020 09:56   DG Abd Portable 1V  Result Date: 10/24/2020 CLINICAL DATA:  75 year old male with distal colonic obstruction due to mass. EXAM: PORTABLE ABDOMEN - 1 VIEW COMPARISON:  CT Abdomen and Pelvis 10/19/2020. FINDINGS: Portable AP supine views at 0932 hours. Gas-filled bowel loops to the level of the mid sigmoid colon, gas pattern appears mildly improved since 10/19/2020. No pneumoperitoneum is evident on this supine view. Stable elevated diaphragm, lung bases. No acute osseous abnormality identified. IMPRESSION: Suspect on going distal obstruction. Gas distended bowel in the abdomen and upper pelvis is mildly improved since the CT 10/19/2020. Electronically Signed   By: Genevie Ann M.D.   On:  10/24/2020 09:54        Scheduled Meds:  [MAR Hold] amiodarone  200 mg Oral BID   [MAR Hold] amLODipine  5 mg Oral Daily   [MAR Hold] carvedilol  6.25 mg Oral BID WC   [MAR Hold] chlorhexidine  15 mL Mouth Rinse BID   [MAR Hold] Chlorhexidine Gluconate Cloth  6 each Topical Daily   [MAR Hold] insulin aspart  0-9 Units Subcutaneous Q4H   [MAR Hold] insulin detemir  15 Units Subcutaneous BID   [MAR Hold] mouth rinse  15 mL Mouth Rinse BID   pneumococcal 23 valent vaccine  0.5 mL Intramuscular Tomorrow-1000   [MAR Hold] potassium chloride  40 mEq Oral Q3H   Continuous Infusions:  [MAR Hold] ceFEPime (MAXIPIME) IV Stopped (10/24/20 1005)   [MAR Hold] metronidazole Stopped (10/24/20 0857)   potassium PHOSPHATE IVPB (in mmol) 85 mL/hr at 10/24/20 0903   sodium bicarbonate 150 mEq in D5W infusion Stopped (10/24/20 0842)     LOS: 4 days    Time spent: 41 minutes spent on chart review, discussion with nursing staff, consultants, updating family and interview/physical exam; more than 50% of that time was spent in counseling and/or coordination of care.    Moiz Ryant J [place] DO [facility] Available via Epic secure chat 7am-7pm After these hours, please refer to coverage provider listed on [email or web address] 10/24/2020, 10:38 AM

## 2020-10-24 NOTE — Consult Note (Addendum)
CC: Abdominal pain  Subjective: Patient currently is laying in bed he has no abdominal pain, he is tolerating full liquids, no pain on palpation.  He asked if we were going to operate on him, and we told him again that we cannot contact his family.  Objective: Vital signs in last 24 hours: Temp:  [97.5 F (36.4 C)-98.6 F (37 C)] 98.4 F (36.9 C) (07/28 0400) Pulse Rate:  [77-110] 88 (07/28 0500) Resp:  [18-32] 31 (07/28 0600) BP: (132-182)/(59-83) 153/59 (07/28 0600) SpO2:  [90 %-100 %] 94 % (07/28 0500) Last BM Date: 10/24/20 1440 p.o. 3300 IV 3400 urine No stool output recorded. Afebrile vital signs are stable, heart rate well controlled for most of the day. WBC continuing to rise. Potassium 2.9  Intake/Output from previous day: 07/27 0701 - 07/28 0700 In: 4795.5 [P.O.:1440; I.V.:2149.6; IV Piggyback:1205.9] Out: 3400 [Urine:3400] Intake/Output this shift: No intake/output data recorded.  General appearance: alert, cooperative, and no distress Resp: clear to auscultation bilaterally GI: Soft nontender even to palpation.  Positive bowel sounds no BM NO BM recorded and he says he did not have one. Lab Results:  Recent Labs    10/23/20 0241 10/24/20 0308  WBC 19.9* 28.7*  HGB 10.9* 11.4*  HCT 34.2* 35.5*  PLT 396 417*    BMET Recent Labs    10/23/20 0241 10/24/20 0308  NA 152* 150*  K 3.3* 2.9*  CL 127* 122*  CO2 16* 23  GLUCOSE 159* 104*  BUN 44* 30*  CREATININE 1.27* 1.22  CALCIUM 8.6* 8.0*   PT/INR No results for input(s): LABPROT, INR in the last 72 hours.  Recent Labs  Lab 10/19/20 2029 10/20/20 0355 10/21/20 0245 10/24/20 0308  AST 33 29 54* 35  ALT '26 25 30 30  '$ ALKPHOS 50 49 53 82  BILITOT 0.6 0.5 0.5 0.5  PROT 6.9 6.2* 6.1* 6.3*  ALBUMIN 2.8* 2.7* 2.4* 2.1*     Lipase  No results found for: LIPASE   Medications:  amiodarone  200 mg Oral BID   carvedilol  3.125 mg Oral BID WC   chlorhexidine  15 mL Mouth Rinse BID    Chlorhexidine Gluconate Cloth  6 each Topical Daily   insulin aspart  0-9 Units Subcutaneous Q4H   insulin detemir  15 Units Subcutaneous BID   mouth rinse  15 mL Mouth Rinse BID   pneumococcal 23 valent vaccine  0.5 mL Intramuscular Tomorrow-1000   potassium chloride  40 mEq Oral Q3H    ceFEPime (MAXIPIME) IV Stopped (10/24/20 0428)   metronidazole Stopped (10/24/20 0035)   potassium PHOSPHATE IVPB (in mmol)     sodium bicarbonate 150 mEq in D5W infusion 75 mL/hr at 10/24/20 0452     Assessment/Plan Sigmoid colon obstruction with pneumatosis  -Dr. Erlinda Hong opinion, GI service; is the that sigmoidoscopy is contraindicated in the setting of sepsis, ischemia, hypertension, pneumatosis and fever  -Unable to contact the brother; patient making slow progress  - Continue resuscitation and antibiotics for now.       FEN: N.p.o./IV fluids ID: Cefepime/Flagyl 7/23 >> day 5 DVT: SCDs added; can have chemical DVT prophylaxis from our standpoint   Sepsis Chest pain, new onset atrial fibrillation with RVR  - CHA2DS2-VASc score:  4  will need  -Echo shows EF 45 to [id number], grade 2 diastolic dysfunction  - amiodarone drip >> PO amiodarone Hx intellectual disability Type 2 diabetes  - A1C 9.9 CKD stage III  -Creatinine 3.92>> 3.2>> 2.24>> 1.61>>1.27>>1.22  Hypokalemia  - K+ 2.9/Mag 2.0/Phos 1.7  - aim for K+ 4.0  Leukocytosis  - WBC 20.0>> 17.6>> 15.4>> 19.9>>28.7(7/28) Morbid obesity BMI 40.9 Severe Malnutrition -prealbumin 6.1   Plan: We are still unable to contact family member to go forward with partial colectomy.  His leukocytosis is rising.  Currently he is not in any distress, and has not reached the point where he is in the emergency.  Administration has been asked to look into assisting with this issue.  I tried both numbers again this morning.  The landline phone says the call cannot be completed, the mobile phone number recording says this number is no longer in  service.  Mark Chang, Mark Chang [id number]          LOS: 4 days    Mark Chang 10/24/2020 Please see Amion

## 2020-10-24 NOTE — Procedures (Signed)
Arterial Catheter Insertion Procedure Note  Mark Chang  HE:[id number]  September 12, 1945  Date:10/24/20  Time:4:48 PM    Provider Performing: Otilio Carpen Jeter Tomey    Procedure: Insertion of Arterial Line [phone]) without US guidance  Indication(s) Blood pressure monitoring and/or need for frequent ABGs  Consent Unable to obtain consent due to emergent nature of procedure.  Anesthesia None   Time Out Verified patient identification, verified procedure, site/side was marked, verified correct patient position, special equipment/implants available, medications/allergies/relevant history reviewed, required imaging and test results available.   Sterile Technique Maximal sterile technique was not able to be achieved due to emergent nature of procedure.   Procedure Description Area of catheter insertion was cleaned with chlorhexidine and draped in sterile fashion. Without real-time ultrasound guidance an arterial catheter was placed into the left radial artery.  Appropriate arterial tracings confirmed on monitor.     Complications/Tolerance None; patient tolerated the procedure well.   EBL Minimal   Specimen(s) None   Otilio Carpen Malvina Schadler, PA-C

## 2020-10-24 NOTE — Anesthesia Procedure Notes (Signed)
Procedure Name: Intubation Date/Time: 10/24/2020 11:25 AM Performed by: Cleda Daub, CRNA Pre-anesthesia Checklist: Patient identified, Emergency Drugs available, Suction available and Patient being monitored Patient Re-evaluated:Patient Re-evaluated prior to induction Oxygen Delivery Method: Circle system utilized Preoxygenation: Pre-oxygenation with 100% oxygen Induction Type: IV induction, Rapid sequence and Cricoid Pressure applied Ventilation: Mask ventilation without difficulty Laryngoscope Size: Miller and 2 Grade View: Grade I Tube type: Oral Tube size: 7.0 mm Number of attempts: 1 Airway Equipment and Method: Stylet and Oral airway Placement Confirmation: ETT inserted through vocal cords under direct vision, positive ETCO2 and breath sounds checked- equal and bilateral Secured at: 23 cm Tube secured with: Tape Dental Injury: Teeth and Oropharynx as per pre-operative assessment

## 2020-10-24 NOTE — Op Note (Signed)
Patient: Mark Chang ([birth year], [id number])  Date of Surgery: 10/19/2020 - 10/24/2020   Preoperative Diagnosis: Perforated viscus, sigmoid colon obstruction  Postoperative Diagnosis: Cecal perforation, sigmoid colon obstruction secondary to mass  Surgical Procedure:  RIGHT COLECTOMY WITH ANASTOMOSIS LEFT COLECTOMY WITH END COLOSTOMY MOBILIZATION OF SPLENIC FLEXURE:    Operative Team Members:  Surgeon(s) and Role:    * Hasina Kreager, Nickola Major, MD - Primary    * Dwan Bolt, MD - Assisting   Anesthesiologist: Barnet Glasgow, MD CRNA: Lind Covert, CRNA; Cleda Daub, CRNA; Niel Hummer, CRNA   Anesthesia: General   Fluids:  Total I/O In: 3326.3 [I.V.:2274.9; IV Piggyback:1051.5] Out: 750 [Urine:700; [id number]  Complications: None  Drains:  none   Specimen:  ID Type Source Tests Collected by Time Destination  1 : right colon GI Right/Ascending Colon SURGICAL PATHOLOGY Philomina Leon, Nickola Major, MD 10/24/2020 1242   2 : LEFT COLON GI Left/Descending Colon SURGICAL PATHOLOGY Braylee Lal, Nickola Major, MD 10/24/2020 1354      Disposition:  PACU - hemodynamically stable.  Plan of Care:  continued ICU care on medicine service    Indications for Procedure: Mark Chang is a 75 y.o. male who presented with a sigmoid colon obstruction, thought to be due to a mass on CT.  He was partially obstructed and having gas and bowel movements.  We recommended sigmoid colectomy, however we were unable to obtain consent from the patient's power of attorney-his brother.  Multiple phone calls to the patient's care facility and all phone numbers provided for his brother were made and we were never able to get in touch with his brother.  As he was partially obstructed it was felt the sigmoid colectomy was urgent, however he appeared to be improving for a few days on the medicine service in the ICU so we did not want a proceed without consent.  Unfortunately his white count increased and  free air was seen on plain film of the abdomen.  At this point the obstruction became an emergent problem so we proceed to the operating room without consent.  This was discussed with multiple surgeons in the anesthesia team who are in agreement that he needed emergent surgery.  I did discuss the surgery itself as well as its risks, benefits, and alternatives with the patient however I am unsure he comprehended everything due to his cognitive disability.  Findings: Cecal perforations secondary to distention, sigmoid colon mass causing obstruction, woody induration of the colon from chronic distention.  Infection status: Patient: Mark Chang Emergency General Surgery Service Patient Case: Emergent Infection Present At Time Of Surgery (PATOS): Feculent Peritonitis -massive amount of gross spillage of stool during the case.   Description of Procedure:   On the date stated above the patient was taken the operating room suite and placed in supine position.  General endotracheal anesthesia was induced.  A timeout was completed verifying the correct patient, procedure, positioning, and equipment needed for the case.  The patient's abdomen was prepped and draped in usual sterile fashion.  Antibiotics were given prior to the case start.  SCDs were placed on the lower extremity.  The patient received scheduled Lovenox preoperatively.  We made a midline laparotomy incision and entered the abdomen without trauma the underlying viscera.  The omentum was lifted out of the pelvis which uncovered the cecal perforation which had originally been controlled by the omentum, however as we removed the omentum a large amount of liquid stool spilled into the  field.  This was controlled by stapling the cecum that closed using a GIA stapler and cleaning the field with multiple liters of irrigation and suction.  With the contamination controlled, we inspected the abdomen.  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  There was woody induration of the entire colon, likely from chronic obstruction.  There were multiple areas of transmural necrosis in the cecum.  We proceeded with right colectomy.  The Bookwalter self-retaining retractor was used for retraction throughout the case.  The patient was placed in head up left side down position.  The right colon was mobilized off of the abdominal sidewall with the white line of Toldt.  We continues dissection around the cecum to the terminal ileum mesentery to lift the cecum and terminal ileum off the retroperitoneum.  Attachments to the retroperitoneum were lysed with electrocautery.  We continues dissection superiorly being careful to avoid injury to the duodenum.  The attachments of the hepatic flexure were divided to mobilize the entire right colon out of the wound.  A location that appeared healthy in the proximal transverse colon was identified as our distal point of transection.  This was just to the right of the middle colic vessels which were identified by palpation.  The bowel was transected here using the GIA stapler.  The terminal ileum was divided with a GIA stapler.  We then divided the mesentery of the short segment of terminal ileum including the specimen and the mesocolon using the LigaSure device.  The right colon was passed off the field as a specimen.  The mesenteric defect was closed using a running silk suture.  We created a side-to-side functional end-to-end anastomosis by creating enterotomies in the antimesenteric border of the small bowel and the tinea of the transverse colon and firing a GIA 75 mm stapler to create the anastomosis.  A Vicryl suture was placed to reinforce the crotch of the staple line.  The common enterotomy was closed using interrupted Vicryl sutures in 2 layers.  The anastomosis was returned to the abdomen we focused our attention to the sigmoid  colon.  As this was an emergent case and the patient was becoming acidotic from the long open surgery and septic response from the colon perforation, we did not perform a high ligation of the IMA.  The patient was placed in headdown right side down position.  Working laterally to medially, the sigmoid colon was mobilized off of the abdominal sidewall by dividing the white line of Toldt.  We continues dissection down to the rectum where the tinea splay.  We elevated the mesocolon out of the retroperitoneum.  Working medially to laterally we scored the sigmoid colon mesentery.  The rectosigmoid colon was divided more than 5 cm distal to the sigmoid colon mass using a green load of the contour stapler.  Prolene sutures were placed on the lateral aspects of the staple line for possible future colostomy takedown.  The staples appear well formed.  A 19 [place] JP drain was left in the pelvis to treat any possible future rectal stump leak.  The sigmoid colon mesentery was divided using the LigaSure.  Retraction was adjusted and the patient's position was adjusted so we could then mobilize the splenic flexure to allow adequate length for colostomy formation.  The patient was placed in head up right side down position.  The left colon was mobilized out of the retroperitoneum  working up the [employer] of Toldt to mobilize the colon.  The omentum was lifted off the distal transverse colon and the splenocolic ligament was divided using the LigaSure device.  The splenic flexure was fully mobilized allowing more length of colon to reach the colostomy without tension.  The sigmoid colon was inspected and a place for transection was decided based on the appearance of the blood supply of the colon.  The colon was divided using a GIA stapler and prepared for ostomy maturation.  The left colon was passed off the field as a specimen.  A defect for the ostomy was created in the left abdominal wall superior and lateral to the umbilicus  and a location over the rectus muscle and away from skin folds.  The anterior rectus fascia was divided using a cruciate incision, the rectus muscle fibers were split along their length, the posterior rectus fascia was divided.  The colostomy was brought through the defect in the abdominal wall and clamped in place for maturation at the end of the case.  The abdomen was irrigated with copious saline irrigation.  The midline incision was closed using running 0 stratafix symmetric barbed suture.  A Penrose drain was left in the subcutaneous space, exiting the inferior aspect of the incision and the skin was stapled closed.  The midline wound was covered while we matured the ostomy.  Four 2-0 Vicryl Brooks sutures were placed in each quadrant to mature the ostomy to the skin and then multiple 2-0 Vicryl sutures were used to reapproximate the mucosa to the epidermis.  A ostomy appliance was applied, a dressing was applied to the midline, a drain sponge was applied to the drain and some 4 x 4's were placed over the Penrose drain with some tape.  All sponge needle counts correct in this case.      Louanna Raw, MD General, Bariatric, & Minimally Invasive Surgery [facility], [place]

## 2020-10-24 NOTE — Consult Note (Addendum)
NAME:  Mark Chang, MRN:  [id number], DOB:  [birth year], LOS: 4 ADMISSION DATE:  10/19/2020, CONSULTATION DATE:  10/24/20 REFERRING MD:  Anesthesia, CHIEF COMPLAINT:  SBO, hypotension   History of Present Illness:  Mark Chang is a 75 year old male with past medical history of type 2 diabetes, COPD, CKD stage III, mental delay who presented from nursing home with 2 weeks of intermittent abdominal pain and diarrhea.  He was found to have cecal obstruction, possibly due to mass, with pneumatosis and sepsis and was admitted to stepdown and treated with broad-spectrum antibiotics and conservative management.  Surgery recommended sigmoid colectomy for treatment of the sigmoid mass and evaluation of viability of the cecum with possible resection, however given patient's intellectual disabilities, consent was sought from patient's family.  They were unable to be contacted, therefore conservative management was continued.  On the morning of 7/28 patient was doing relatively well and tolerating a diet, however given rising white blood cell count abdominal x-ray was obtained which showed free air under the diaphragm, likely from perforation.  Therefore patient was taken to the operating room emergently.   Post-op, pt was noted to have respiratory acidosis and so left intubated.      On arrival to the ICU, patient was on 6 mcg of Levophed and propofol.  Shortly after arrival to the unit, patient had rapid decompensation with bradycardia and SBP in the 40s off propofol and then converted to V. tach.  Patient never lost pulses, responded to high-dose Levophed, 2 mg of epi and 300 mg amiodarone.  Per report he received 3.5 L IV fluid in the OR.  Stat labs are pending.    Pertinent  Medical History   Past Medical History:  Diagnosis Date   CKD (chronic kidney disease), stage III ([place]) 03/24/2017   COPD (chronic obstructive pulmonary disease) ([place])    DM (diabetes mellitus), type 2 with renal complications ([place])  123XX123   Hyperlipidemia    Hypertension    Mental retardation    Obesity, Class III, BMI 40-49.9 (morbid obesity) ([place]) 03/24/2017   Pernicious anemia      Significant Hospital Events: Including procedures, antibiotic start and stop dates in addition to other pertinent events   7/24 admitted to hospitalist service with GI and surgery consulting, cecal obstruction and pneumatosis noted.  Started on cefepime and Flagyl 7/28-clinically improved, however rising white blood cell count prompted abdominal x-ray which showed free air.  Taken emergently to the OR for colectomy with end colostomy, near code post ICU room arrival.  Right IJ double-lumen CVC placed in OR, left radial A-line placed  Interim History / Subjective:  As above, had an episode of hypotension and bradycardia perhaps precipitated by acidosis.  Improved with pressors and bicarb  Objective   Blood pressure (!) 169/71, pulse 100, temperature 98.8 F (37.1 C), temperature source Oral, resp. rate 18, height '5\' 8"'$  (1.727 m), weight 122 kg, SpO2 97 %.        Intake/Output Summary (Last 24 hours) at 10/24/2020 1550 Last data filed at 10/24/2020 1525 Gross per 24 hour  Intake 5907.37 ml  Output 3400 ml  Net 2507.37 ml   Filed Weights   10/19/20 2019  Weight: 122 kg    General: Critically ill-appearing obese male, intubated and sedated HEENT: MM pink/moist, ETT in place Neuro: Examined post anesthesia on propofol, unresponsive RASS -5 CV: s1s2 bradycardic, no m/r/g PULM: On full ventilator support with FiO2 100% and PEEP of 5, decreased air entry bilateral  bases without significant rhonchi or wheezing GI: soft, bsx4 active, postop dressing in place with new ostomy, no noted bleeding Extremities: warm/dry, no edema  Skin: no rashes or lesions  Resolved Hospital Problem list     Assessment & Plan:    Septic shock secondary to bowel perforation, cecal mass and likely ischemia POA Admitted with cecal mass several  days ago, operative management delayed as patient has cognitive deficits and no family or POA could be located.  He appeared to be improving clinically with medical management and was advanced to a diet today.  Labs noted rising leukocytosis and abdominal x-ray with free air, therefore was taken emergently to the operating room for colectomy with end ostomy. Had near code event on arrival to the ICU P: -Continue Levophed to maintain MAP> 65, currently down to 8 mcg add vasopressin if requirement of up-titrating again -Continue cefepime and Flagyl, try repeat blood cultures in the setting of perforation and shock -Postop management per surgery, surgical pathology of cecal mass is pending  Acute respiratory failure, post-op In the setting of anesthesia and underlying COPD P: -ABG and CXR pending --Maintain full vent support with SAT/SBT as tolerated -titrate Vent setting to maintain SpO2 greater than or equal to 90%. -HOB elevated 30 degrees. -Plateau pressures less than 30 cm H20.  -Follow chest x-ray, ABG prn.   -Bronchial hygiene and RT/bronchodilator protocol.   NAGMA POA Possibly secondary to bicarb loss related to persistent diarrhea  P: -Received 2 AMPs bicarb -Adequately fluid resuscitated -Follow BMP and ABG  Type 2 diabetes POA P: -Continue Levemir 15 units and SSI   Acute on chronic renal insufficiency  POA Presenting creatinine at 9, improved to 1.2 P: -Continue to closely monitor renal indices, electrolytes and urine output -Avoid nephrotoxins as able  Proximal atrial fibrillation with RVR, HFrEF Evaluated by cardiology, now signed off Echo 7/25 with EF 45 to 50% with grade 1 diastolic dysfunction P: -Beta-blocker in the setting of shock, continue amiodarone -Back in sinus rhythm, no anticoagulation postop   Diarrhea POA P: -C. difficile negative, continue to replete electrolytes and monitor metabolic acidosis  Hypokalemia,  hypernatremia POA P: -Monitor K and replete, check magnesium level -Sodium 146 postop, monitor may require free water   Baseline COPD Did not appear to be in acute exacerbation P: -Monitor, as needed nebulizers  Chronic cognitive deficit POA P: -Caseworker assisting with attempts to find POA which have thus far been unsuccessful.  May need state guardian -ethics consult has been placed  Past medical history of hypertension, hyperlipidemia -Hold Lasix, lisinopril, beta-blocker in the setting of shock and hypotension   Best Practice (right click and "Reselect all SmartList Selections" daily)   Diet/type: NPO DVT prophylaxis: SCD GI prophylaxis: PPI Lines: yes and it is still needed Foley:  Yes, and it is still needed Code Status:  full code Last date of multidisciplinary goals of care discussion [pending, unable to reach family or [facility] POA]  Labs   CBC: Recent Labs  Lab 10/19/20 2029 10/20/20 0355 10/21/20 0245 10/22/20 0248 10/23/20 0241 10/24/20 0308  WBC 22.7* 20.0* 17.6* 15.4* 19.9* 28.7*  NEUTROABS 18.6*  --   --   --   --   --   HGB 11.5* 10.9* 11.3* 11.7* 10.9* 11.4*  HCT 36.0* 33.4* 34.5* 37.3* 34.2* 35.5*  MCV 89.8 88.4 88.2 90.3 87.7 89.0  PLT 384 344 369 375 396 417*    Basic Metabolic Panel: Recent Labs  Lab 10/20/20 0355 10/21/20 0245  10/21/20 2153 10/22/20 0248 10/22/20 1656 10/23/20 0241 10/24/20 0308  NA 134* 139  --  146*  --  152* 150*  K 3.4* 2.6* 2.5* 2.6* 3.1* 3.3* 2.9*  CL 106 112*  --  122*  --  127* 122*  CO2 15* 16*  --  17*  --  16* 23  GLUCOSE 147* 67*  --  126*  --  159* 104*  BUN 110* 91*  --  69*  --  44* 30*  CREATININE 3.28* 2.24*  --  1.64*  --  1.27* 1.22  CALCIUM 8.1* 8.1*  --  8.2*  --  8.6* 8.0*  MG  --  2.6*  --  2.6*  --  2.3 2.0  PHOS  --  3.8  --  2.5  --  1.5* 1.7*   GFR: Estimated Creatinine Clearance: 65.4 mL/min (by C-G formula based on SCr of 1.22 mg/dL). Recent Labs  Lab 10/19/20 2029 10/19/20 2255  10/20/20 0027 10/20/20 0327 10/20/20 0355 10/21/20 0245 10/22/20 0248 10/23/20 0241 10/24/20 0308  PROCALCITON  --   --   --   --  6.58  --   --   --   --   WBC 22.7*  --   --   --  20.0* 17.6* 15.4* 19.9* 28.7*  LATICACIDVEN 2.2* 1.5 1.3 0.9  --   --   --   --   --     Liver Function Tests: Recent Labs  Lab 10/19/20 2029 10/20/20 0355 10/21/20 0245 10/24/20 0308  AST 33 29 54* 35  ALT '26 25 30 30  '$ ALKPHOS 50 49 53 82  BILITOT 0.6 0.5 0.5 0.5  PROT 6.9 6.2* 6.1* 6.3*  ALBUMIN 2.8* 2.7* 2.4* 2.1*   No results for input(s): LIPASE, AMYLASE in the last 168 hours. No results for input(s): AMMONIA in the last 168 hours.  ABG    Component Value Date/Time   PHART 7.383 07/18/2016 2210   PCO2ART 42.4 07/18/2016 2210   PO2ART 79.0 (L) 07/18/2016 2210   HCO3 25.3 07/18/2016 2210   TCO2 27 07/18/2016 2210   O2SAT 95.0 07/18/2016 2210     Coagulation Profile: Recent Labs  Lab 10/19/20 2029 10/20/20 0355  INR 1.3* 1.2    Cardiac Enzymes: No results for input(s): CKTOTAL, CKMB, CKMBINDEX, TROPONINI in the last 168 hours.  HbA1C: Hgb A1c MFr Bld  Date/Time Value Ref Range Status  10/19/2020 08:29 PM 6.5 (H) 4.8 - 5.6 % Final    Comment:    (NOTE)         Prediabetes: 5.7 - 6.4         Diabetes: >6.4         Glycemic control for adults with diabetes: <7.0   03/23/2017 08:16 AM 9.9 (H) 4.8 - 5.6 % Final    Comment:    (NOTE) Pre diabetes:          5.7%-6.4% Diabetes:              >6.4% Glycemic control for   <7.0% adults with diabetes     CBG: Recent Labs  Lab 10/23/20 2012 10/23/20 2339 10/24/20 0348 10/24/20 0729 10/24/20 1106  GLUCAP 177* 162* 95 111* 185*    Review of Systems:   Unable to obtain secondary to intubation and sedation  Past Medical History:  He,  has a past medical history of CKD (chronic kidney disease), stage III ([place]) (03/24/2017), COPD (chronic obstructive pulmonary disease) ([place]), DM (diabetes mellitus), type  2 with renal  complications ([place]) (123XX123), Hyperlipidemia, Hypertension, Mental retardation, Obesity, Class III, BMI 40-49.9 (morbid obesity) ([place]) (03/24/2017), and Pernicious anemia.   Surgical History:  History reviewed. No pertinent surgical history.   Social History:   reports that he has never smoked. He has never used smokeless tobacco. He reports that he does not drink alcohol.   Family History:  His Family history is unknown by patient.   Allergies No Known Allergies   Home Medications  Prior to Admission medications   Medication Sig Start Date End Date Taking? Authorizing Provider  acetaminophen (TYLENOL) 500 MG tablet Take 500 mg by mouth 3 (three) times daily.   Yes [provider]  atorvastatin (LIPITOR) 40 MG tablet Take 40 mg by mouth at bedtime. 07/03/19  Yes [provider]  dextrose (GLUTOSE) 40 % GEL Take 1 Tube by mouth once as needed for low blood sugar.   Yes [provider]  dicyclomine (BENTYL) 20 MG tablet Take 20 mg by mouth 2 (two) times daily. 10/17/20  Yes [provider]  docusate sodium (COLACE) 100 MG capsule Take 100 mg by mouth 2 (two) times daily.   Yes [provider]  escitalopram (LEXAPRO) 10 MG tablet Take 10 mg by mouth daily.   Yes [provider]  famotidine (PEPCID) 20 MG tablet Take 20 mg by mouth 2 (two) times daily. 06/12/19  Yes [provider]  Fenofibrate 50 MG CAPS Take 50 mg by mouth every evening. With food 09/27/20  Yes [provider]  furosemide (LASIX) 40 MG tablet Take 40 mg by mouth daily.   Yes [provider]  gabapentin (NEURONTIN) 100 MG capsule Take 100 mg by mouth 2 (two) times daily.  07/03/19  Yes [provider]  insulin aspart (NOVOLOG FLEXPEN) 100 UNIT/ML FlexPen Inject 8 Units into the skin 3 (three) times daily with meals.   Yes [provider]  Insulin Glargine (BASAGLAR KWIKPEN) 100 UNIT/ML Inject 40 Units into the skin every morning.  10/08/20  Yes [provider]  Insulin Glargine (BASAGLAR KWIKPEN) 100 UNIT/ML Inject 10 Units into the skin at bedtime.   Yes [provider]  lisinopril (PRINIVIL,ZESTRIL) 40 MG tablet Take 40 mg by mouth daily.    Yes [provider]  Melatonin 5 MG SUBL Take 10 mg by mouth at bedtime.   Yes [provider]  polyethylene glycol powder (GLYCOLAX/MIRALAX) 17 GM/SCOOP powder Take 17 g by mouth 2 (two) times daily as needed for mild constipation or moderate constipation. Mix with 8 oz of liquid   Yes [provider]  potassium chloride SA (K-DUR,KLOR-CON) 20 MEQ tablet Take 20 mEq by mouth daily.   Yes [provider]  TRULICITY 1.5 0000000 SOPN Inject 1.5 mg into the skin every Thursday. 06/13/19  Yes [provider]  vitamin B-12 (CYANOCOBALAMIN) 1000 MCG tablet Take 1,000 mcg by mouth daily.   Yes [provider]  insulin aspart (NOVOLOG) 100 UNIT/ML injection CBG 70 - 120: 0 units CBG 121 - 150: 2 units CBG 151 - 200: 3 units CBG 201 - 250: 5 units CBG 251 - 300: 8 units CBG 301 - 350: 11 units CBG 351 - 400: 15 units Patient not taking: No sig reported 07/23/16   Hosie Poisson, MD     Critical care time: 65 minutes     CRITICAL CARE Performed by: Otilio Carpen Annabell Oconnor   Total critical care time: 65 minutes  Critical care time was exclusive  of separately billable procedures and treating other patients.  Critical care was necessary to treat or prevent imminent or life-threatening deterioration.  Critical care was time spent personally by me on the following activities: development of treatment plan with patient and/or surrogate as well as nursing, discussions with consultants, evaluation of patient's response to treatment, examination of patient, obtaining history from patient or surrogate, ordering and performing treatments and interventions, ordering and review of laboratory studies, ordering and review of  radiographic studies, pulse oximetry and re-evaluation of patient's condition.   Otilio Carpen Destry Bezdek, PA-C [facility] See Amion for pager If no response to pager , please call [phone] until 7pm After 7:00 pm call Elink  [phone]?[place]

## 2020-10-24 NOTE — Transfer of Care (Signed)
Immediate Anesthesia Transfer of Care Note  Patient: Mark Chang  Procedure(s) Performed: EXPLORATORY LAPAROTOMY, RIGHT COLECTOMY WITH ANASTOMOSIS. LEFT COLECTOMY WITH COLOSTOMY, MOBILIZATION OF SPLENIC FLEXURE  Patient Location: ICU  Anesthesia Type:General  Level of Consciousness: Patient remains intubated per anesthesia plan  Airway & Oxygen Therapy: Patient remains intubated per anesthesia plan and Patient placed on Ventilator (see vital sign flow sheet for setting)  Post-op Assessment: Report given to RN and Post -op Vital signs reviewed and stable  Post vital signs: Reviewed and stable  Last Vitals:  Vitals Value Taken Time  BP 54/34 10/24/20 1600  Temp    Pulse 101 10/24/20 1600  Resp 15 10/24/20 1600  SpO2 99 % 10/24/20 1600  Vitals shown include unvalidated device data.  Last Pain:  Vitals:   10/24/20 1027  TempSrc: Oral  PainSc: 0-No pain      Patients Stated Pain Goal: 2 ([id number] [id number])  Complications: No notable events documented.

## 2020-10-24 NOTE — Anesthesia Preprocedure Evaluation (Addendum)
Anesthesia Evaluation  Patient identified by MRN, date of birth, ID band Patient confused    Reviewed: Allergy & Precautions, NPO status , Patient's Chart, lab work & pertinent test results  Airway Mallampati: II  TM Distance: >3 FB Neck ROM: Full    Dental no notable dental hx. (+) Edentulous Upper, Edentulous Lower   Pulmonary    Pulmonary exam normal breath sounds clear to auscultation       Cardiovascular hypertension, Normal cardiovascular exam+ dysrhythmias Atrial Fibrillation  Rhythm:Regular Rate:Normal  10/21/20 Echo 1. Left ventricular ejection fraction, by estimation, is 45 to 50%. The  left ventricle has mildly decreased function. The left ventricle  demonstrates global hypokinesis. There is mild left ventricular  hypertrophy. Left ventricular diastolic parameters  are consistent with Grade I diastolic dysfunction (impaired relaxation).  2. Right ventricular systolic function is normal. The right ventricular  size is not well visualized. Tricuspid regurgitation signal is inadequate  for assessing PA pressure.  3. The mitral valve is normal in structure. No evidence of mitral valve  regurgitation. No evidence of mitral stenosis.  4. The aortic valve is tricuspid. Aortic valve regurgitation is not  visualized. Mild aortic valve sclerosis is present, with no evidence of  aortic valve stenosis.  5. The inferior vena cava is dilated in size with <50% respiratory  variability, suggesting right atrial pressure of 15 mmHg.  6. Technically difficult study with poor acoustic windows.    Neuro/Psych    GI/Hepatic   Endo/Other  diabetesMorbid obesity  Renal/GU ARFRenal diseaseLab Results      Component                Value               Date                      CREATININE               1.22                10/24/2020                BUN                      30 (H)              10/24/2020                NA                        150 (H)             10/24/2020                K                        2.9 (L)             10/24/2020                CL                       122 (H)             10/24/2020                CO2  23                  10/24/2020                Musculoskeletal   Abdominal (+) + obese (BMI 40.90),   Peds  Hematology  (+) anemia , Lab Results      Component                Value               Date                      WBC                      28.7 (H)            10/24/2020                HGB                      11.4 (L)            10/24/2020                HCT                      35.5 (L)            10/24/2020                MCV                      89.0                10/24/2020                PLT                      417 (H)             10/24/2020              Anesthesia Other Findings   Reproductive/Obstetrics                            Anesthesia Physical Anesthesia Plan  ASA: 3 and emergent  Anesthesia Plan: General   Post-op Pain Management:    Induction: Intravenous, Cricoid pressure planned and Rapid sequence  PONV Risk Score and Plan: 3 and Treatment may vary due to age or medical condition and Ondansetron  Airway Management Planned:   Additional Equipment: CVP and Arterial line  Intra-op Plan:   Post-operative Plan: Possible Post-op intubation/ventilation and Extubation in OR  Informed Consent: I have reviewed the patients History and Physical, chart, labs and discussed the procedure including the risks, benefits and alternatives for the proposed anesthesia with the patient or authorized representative who has indicated his/her understanding and acceptance.     Dental advisory given  Plan Discussed with: Anesthesiologist  Anesthesia Plan Comments: (GA + large Bore IV ? [facility] [employer])       Anesthesia Quick Evaluation

## 2020-10-24 NOTE — Anesthesia Procedure Notes (Signed)
Central Venous Catheter Insertion Performed by: Barnet Glasgow, MD, anesthesiologist Start/End7/28/2022 11:35 AM, 10/24/2020 11:50 AM Patient location: Pre-op. Preanesthetic checklist: patient identified, IV checked, site marked, risks and benefits discussed, surgical consent, monitors and equipment checked, pre-op evaluation, timeout performed and anesthesia consent Position: Trendelenburg Lidocaine 1% used for infiltration and patient sedated Hand hygiene performed  and maximum sterile barriers used  Catheter size: 8 Fr Total catheter length 16. Central line was placed.Double lumen Procedure performed using ultrasound guided technique. Ultrasound Notes:anatomy identified, needle tip was noted to be adjacent to the nerve/plexus identified and image(s) printed for medical record Attempts: 1 Following insertion, dressing applied, line sutured and Biopatch. Post procedure assessment: blood return through all ports  Patient tolerated the procedure well with no immediate complications.

## 2020-10-25 ENCOUNTER — Encounter (HOSPITAL_COMMUNITY): Payer: Self-pay | Admitting: Surgery

## 2020-10-25 DIAGNOSIS — R198 Other specified symptoms and signs involving the digestive system and abdomen: Secondary | ICD-10-CM | POA: Diagnosis not present

## 2020-10-25 LAB — COMPREHENSIVE METABOLIC PANEL
ALT: 18 U/L (ref 0–44)
AST: 23 U/L (ref 15–41)
Albumin: 2 g/dL — ABNORMAL LOW (ref 3.5–5.0)
Alkaline Phosphatase: 48 U/L (ref 38–126)
Anion gap: 5 (ref 5–15)
BUN: 33 mg/dL — ABNORMAL HIGH (ref 8–23)
CO2: 22 mmol/L (ref 22–32)
Calcium: 7.1 mg/dL — ABNORMAL LOW (ref 8.9–10.3)
Chloride: 119 mmol/L — ABNORMAL HIGH (ref 98–111)
Creatinine, Ser: 1.49 mg/dL — ABNORMAL HIGH (ref 0.61–1.24)
GFR, Estimated: 48 mL/min — ABNORMAL LOW (ref >60)
Glucose, Bld: 158 mg/dL — ABNORMAL HIGH (ref 70–99)
Potassium: 3.6 mmol/L (ref 3.5–5.1)
Sodium: 146 mmol/L — ABNORMAL HIGH (ref 135–145)
Total Bilirubin: 0.8 mg/dL (ref 0.3–1.2)
Total Protein: 5 g/dL — ABNORMAL LOW (ref 6.5–8.1)

## 2020-10-25 LAB — PHOSPHORUS: Phosphorus: 1.9 mg/dL — ABNORMAL LOW (ref 2.5–4.6)

## 2020-10-25 LAB — CBC
HCT: 30.6 % — ABNORMAL LOW (ref 39.0–52.0)
Hemoglobin: 10 g/dL — ABNORMAL LOW (ref 13.0–17.0)
MCH: 28.3 pg (ref 26.0–34.0)
MCHC: 32.7 g/dL (ref 30.0–36.0)
MCV: 86.7 fL (ref 80.0–100.0)
Platelets: 414 10*3/uL — ABNORMAL HIGH (ref 150–400)
RBC: 3.53 MIL/uL — ABNORMAL LOW (ref 4.22–5.81)
RDW: 15.5 % (ref 11.5–15.5)
WBC: 23.4 10*3/uL — ABNORMAL HIGH (ref 4.0–10.5)
nRBC: 0.2 % (ref 0.0–0.2)

## 2020-10-25 LAB — BLOOD GAS, ARTERIAL
Acid-base deficit: 4.3 mmol/L — ABNORMAL HIGH (ref 0.0–2.0)
Bicarbonate: 18.4 mmol/L — ABNORMAL LOW (ref 20.0–28.0)
Drawn by: 275531
FIO2: 36
O2 Content: 4 L/min
O2 Saturation: 93.6 %
Patient temperature: 98.6
pCO2 arterial: 28.8 mmHg — ABNORMAL LOW (ref 32.0–48.0)
pH, Arterial: 7.42 (ref 7.350–7.450)
pO2, Arterial: 73 mmHg — ABNORMAL LOW (ref 83.0–108.0)

## 2020-10-25 LAB — GLUCOSE, CAPILLARY
Glucose-Capillary: 130 mg/dL — ABNORMAL HIGH (ref 70–99)
Glucose-Capillary: 139 mg/dL — ABNORMAL HIGH (ref 70–99)
Glucose-Capillary: 141 mg/dL — ABNORMAL HIGH (ref 70–99)
Glucose-Capillary: 142 mg/dL — ABNORMAL HIGH (ref 70–99)
Glucose-Capillary: 142 mg/dL — ABNORMAL HIGH (ref 70–99)
Glucose-Capillary: 168 mg/dL — ABNORMAL HIGH (ref 70–99)

## 2020-10-25 LAB — MAGNESIUM: Magnesium: 1.8 mg/dL (ref 1.7–2.4)

## 2020-10-25 LAB — TRIGLYCERIDES: Triglycerides: 163 mg/dL — ABNORMAL HIGH (ref <150)

## 2020-10-25 MED ORDER — FENTANYL CITRATE (PF) 100 MCG/2ML IJ SOLN
25.0000 ug | INTRAMUSCULAR | Status: DC | PRN
  Administered 2020-10-25 – 2020-10-27 (×3): 25 ug via INTRAVENOUS
  Administered 2020-10-27: 100 ug via INTRAVENOUS
  Filled 2020-10-25 (×3): qty 2

## 2020-10-25 MED ORDER — POTASSIUM PHOSPHATES 15 MMOLE/5ML IV SOLN
30.0000 mmol | Freq: Once | INTRAVENOUS | Status: AC
  Administered 2020-10-25: 30 mmol via INTRAVENOUS
  Filled 2020-10-25: qty 10

## 2020-10-25 MED ORDER — ORAL CARE MOUTH RINSE
15.0000 mL | Freq: Two times a day (BID) | OROMUCOSAL | Status: DC
  Administered 2020-10-25 – 2020-11-06 (×24): 15 mL via OROMUCOSAL

## 2020-10-25 MED ORDER — SODIUM CHLORIDE 0.9 % IV SOLN
2.0000 g | Freq: Two times a day (BID) | INTRAVENOUS | Status: DC
  Administered 2020-10-25 – 2020-10-27 (×4): 2 g via INTRAVENOUS
  Filled 2020-10-25 (×4): qty 2

## 2020-10-25 MED ORDER — ENOXAPARIN SODIUM 40 MG/0.4ML IJ SOSY
40.0000 mg | PREFILLED_SYRINGE | INTRAMUSCULAR | Status: DC
  Administered 2020-10-25 – 2020-11-06 (×13): 40 mg via SUBCUTANEOUS
  Filled 2020-10-25 (×13): qty 0.4

## 2020-10-25 MED ORDER — MAGNESIUM SULFATE 2 GM/50ML IV SOLN
2.0000 g | Freq: Once | INTRAVENOUS | Status: AC
  Administered 2020-10-25: 2 g via INTRAVENOUS

## 2020-10-25 NOTE — Procedures (Signed)
  Extubation Procedure Note  Patient Details:   Name: Mark Chang DOB: 06/12/1945 MRN: [id number]   Airway Documentation:    Vent end date: 10/25/20 Vent end time: 1040   Evaluation  O2 sats: stable throughout Complications: No apparent complications Patient did tolerate procedure well. Bilateral Breath Sounds: Diminished   Yes  [name] 10/25/2020, 10:42 AM

## 2020-10-25 NOTE — Progress Notes (Signed)
PHARMACY NOTE:  ANTIMICROBIAL RENAL DOSAGE ADJUSTMENT  Current antimicrobial regimen includes a mismatch between antimicrobial dosage and estimated renal function.  As per policy approved by the Pharmacy & Therapeutics and Medical Executive Committees, the antimicrobial dosage will be adjusted accordingly.  Current antimicrobial dosage:  Cefepime 2g IV q8  Indication: IAI  Renal Function:  Estimated Creatinine Clearance: 53.6 mL/min (A) (by C-G formula based on SCr of 1.49 mg/dL (H)). '[]'$      On intermittent HD, scheduled: '[]'$      On CRRT    Antimicrobial dosage has been changed to:  Cefepime 2g IV q12  Additional comments:   Thank you for allowing pharmacy to be a part of this patient's care.  Arlyn Dunning [place], [facility] 10/25/2020 7:28 AM

## 2020-10-25 NOTE — Progress Notes (Addendum)
[place] Progress Note Patient Name: Mark Chang DOB: [birth year] MRN: [id number]   Date of Service  10/25/2020  HPI/Events of Note  Hypokalemia  Hypophosphatemia - K+ = 3.6, PO4--- = 1.9, Mg++ = 1.8 and Creatinine - 1.49.  eICU Interventions  Will replace K+, PO4--- and Mg++.     Intervention Category Major Interventions: Electrolyte abnormality - evaluation and management  Mark Chang 10/25/2020, 6:45 AM

## 2020-10-25 NOTE — Evaluation (Signed)
SLP Cancellation Note  Patient Details Name: Mark Chang MRN: [id number] DOB: January 18, 1946   Cancelled treatment:       Reason Eval/Treat Not Completed: Other (comment);Medical issues which prohibited therapy (Order for swallow evaluation received, pt just extubated and pt has NG in place, will continue efforts. Requested RN message SLP If pt become appropriate for po/eval. Thanks.)  Kathleen Lime, MS [place] Office [phone] Pager [phone]  [name] 10/25/2020, 11:14 AM

## 2020-10-25 NOTE — Progress Notes (Signed)
Progress Note: General Surgery Service   Chief Complaint/Subjective: Mark Chang had some hypotension after surgery yesterday and was started on pressor support in the ICU.  This morning he is looking much better with pressors weaning off and the ventilator weaning well.  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    Objective: Vital signs in last 24 hours: Temp:  [98.7 F (37.1 C)-100 F (37.8 C)] 99.1 F (37.3 C) (07/29 0400) Pulse Rate:  [56-108] 95 (07/29 0900) Resp:  [15-32] 32 (07/29 0900) BP: (54-169)/(23-71) 139/60 (07/29 0900) SpO2:  [96 %-100 %] 98 % (07/29 0900) Arterial Line BP: (55-163)/(43-79) 128/54 (07/29 0900) FiO2 (%):  [30 %-100 %] 30 % (07/29 0833) Last BM Date: 10/25/20  Intake/Output from previous day: 07/28 0701 - 07/29 0700 In: 8174 [I.V.:4843.8; IV Piggyback:3330.2] Out: 0093 [Urine:1430; Drains:235; Blood:100] Intake/Output this shift: Total I/O In: 597.9 [I.V.:452.3; IV Piggyback:145.6] Out: 80 [Drains:80]  GI: Abd end colostomy viable with liquid stool in appliance, midline incision c/d/I with staples and penrose drain with serosanguinous drainage exiting lower aspect of incision.  Pelvic JP drain exiting right abdomen with serosanguinous drainage  Lab Results: CBC  Recent Labs    10/24/20 1609 10/25/20 0300  WBC 11.5* 23.4*  HGB 9.5* 10.0*  HCT 29.8* 30.6*  PLT 276 414*   BMET Recent Labs    10/24/20 1609 10/25/20 0300  NA 146* 146*  K 3.0* 3.6  CL 119* 119*  CO2 21* 22  GLUCOSE 134* 158*  BUN 29* 33*  CREATININE 1.22 1.49*  CALCIUM 7.1* 7.1*   PT/INR No results for input(s): LABPROT, INR in the last 72 hours. ABG Recent Labs    10/24/20 1516 10/24/20 1709  PHART  --  7.375  HCO3 21.8 21.3    Anti-infectives: Anti-infectives (From admission, onward)    Start     Dose/Rate Route Frequency Ordered Stop   10/25/20 1400  ceFEPIme (MAXIPIME) 2 g in sodium chloride 0.9 % 100 mL IVPB        2 g 200 mL/hr over 30 Minutes  Intravenous Every 12 hours 10/25/20 0729     10/23/20 1000  ceFEPIme (MAXIPIME) 2 g in sodium chloride 0.9 % 100 mL IVPB  Status:  Discontinued        2 g 200 mL/hr over 30 Minutes Intravenous Every 8 hours 10/23/20 0833 10/25/20 0729   10/21/20 1000  ceFEPIme (MAXIPIME) 2 g in sodium chloride 0.9 % 100 mL IVPB  Status:  Discontinued        2 g 200 mL/hr over 30 Minutes Intravenous Every 12 hours 10/21/20 0742 10/23/20 0833   10/21/20 0600  metroNIDAZOLE (FLAGYL) IVPB 500 mg        500 mg 100 mL/hr over 60 Minutes Intravenous Every 8 hours 10/20/20 0028     10/20/20 2200  ceFEPIme (MAXIPIME) 2 g in sodium chloride 0.9 % 100 mL IVPB  Status:  Discontinued        2 g 200 mL/hr over 30 Minutes Intravenous Every 24 hours 10/20/20 0048 10/21/20 0742   10/19/20 2030  ceFEPIme (MAXIPIME) 2 g in sodium chloride 0.9 % 100 mL IVPB        2 g 200 mL/hr over 30 Minutes Intravenous  Once 10/19/20 2029 10/20/20 0033   10/19/20 2030  metroNIDAZOLE (FLAGYL) IVPB 500 mg        500 mg 100 mL/hr over 60 Minutes Intravenous  Once 10/19/20 2029 10/19/20 2210  Medications: Scheduled Meds:  chlorhexidine gluconate (MEDLINE KIT)  15 mL Mouth Rinse BID   Chlorhexidine Gluconate Cloth  6 each Topical Daily   docusate  100 mg Per Tube BID   fentaNYL (SUBLIMAZE) injection  25 mcg Intravenous Once   insulin aspart  0-9 Units Subcutaneous Q4H   mouth rinse  15 mL Mouth Rinse 10 times per day   pantoprazole (PROTONIX) IV  40 mg Intravenous Daily   pneumococcal 23 valent vaccine  0.5 mL Intramuscular Tomorrow-1000   polyethylene glycol  17 g Per Tube Daily   Continuous Infusions:  acetaminophen Stopped (10/25/20 0517)   amiodarone 30 mg/hr (10/25/20 0900)   ceFEPime (MAXIPIME) IV     lactated ringers with kcl 100 mL/hr at 10/25/20 0900   methocarbamol (ROBAXIN) IV Stopped (10/25/20 0534)   metronidazole Stopped (10/25/20 0842)   norepinephrine (LEVOPHED) Adult infusion 4 mcg/min (10/25/20 0900)    potassium PHOSPHATE IVPB (in mmol)     propofol (DIPRIVAN) infusion 15 mcg/kg/min (10/25/20 0738)   sodium bicarbonate 150 mEq in D5W infusion Stopped (10/24/20 0842)   PRN Meds:.fentaNYL (SUBLIMAZE) injection, hydrALAZINE, metoprolol tartrate, ondansetron **OR** ondansetron (ZOFRAN) IV  Assessment/Plan: Mark Chang is a 75 year old male with a cognitive disability who presented due to a large bowel obstruction due to 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  We were unable to contact his power of attorney brother and he initially improved with medical management.  Unfortunately developed free air, so he proceeded to the operating room emergently on 10/24/2020 for a right colectomy with anastomosis and left colectomy with end colostomy.  - Pressors weaning, ventilator weaning, continued care by the critical care - Continue NPO status with NG tube to low intermittent wall suction if patient is extubated - Slow advancement of diet to allow some time for enterocolonic anastomosis to heal, early POD1 bowel function is likely just the obstructed colon decompressing. -Monitor pelvic JP drain sitting near rectal staple line -Monitor subcutaneous Penrose drain exiting inferior aspect of incision    LOS: 5 days   FEN: IV fluids per critical care ID: Antibiotic coverage per medical team, I would continue antibiotics until white count normalizes due to abdominal fecal contamination during surgery VTE: SCDs, Lovenox Foley: Continue foley, difficulty voiding preoperatively Dispo: Continued care in [place], MD  [facility] Use [email or web address] to contact on call provider

## 2020-10-25 NOTE — Progress Notes (Signed)
Prior to pt extubation a cuff leak was performed and was positive

## 2020-10-25 NOTE — TOC Progression Note (Signed)
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  Unablde to locate brother who is the poa.  Have made all attempts to locate.   Expected Discharge Plan: Home/Self Care Barriers to Discharge: Continued Medical Work up  Expected Discharge Plan and Services Expected Discharge Plan: Home/Self Care   Discharge Planning Services: CM Consult   Living arrangements for the past 2 months: Single Family Home                                       Social Determinants of Health (SDOH) Interventions    Readmission Risk Interventions No flowsheet data found.

## 2020-10-25 NOTE — Progress Notes (Signed)
Initial Nutrition Assessment  DOCUMENTATION CODES:   Morbid obesity  INTERVENTION:   Once diet advanced: -Ensure Enlive po BID, each supplement provides 350 kcal and 20 grams of protein  -Prosource Plus PO BID, each provides 100 kcals and 15g protein -Multivitamin with minerals daily  If diet is unable to be advanced in next 24-48 hours, need to consider TPN initiation.  NUTRITION DIAGNOSIS:   Increased nutrient needs related to post-op healing, acute illness as evidenced by estimated needs.  GOAL:   Patient will meet greater than or equal to 90% of their needs  MONITOR:   PO intake, Supplement acceptance, Labs, Diet advancement, Weight trends, I & O's, Skin  REASON FOR ASSESSMENT:   Consult Assessment of nutrition requirement/status  ASSESSMENT:   75 years old male with PMH significant for DM2, HTN, intellectual disability, CKD stage IIIa presented in the ED with c/o: abdominal pain, diarrhea and hypotension.  As per skilled nursing staff patient reports having abdominal pain for the last couple of weeks.  Per surgery, pt with colon obstruction.  7/23: admitted 7/28: s/p right colectomy, left colectomy with end colostomy, intubated 7/29: extubated, NGT in placed set to suction  Pt now extubated. Still NPO at this time. Noted SLP to see for swallow evaluation. Pt currently disoriented x 4.   RD was consulted on 7/27 when pt was still on liquids. Course has changed since. Last intake was 50% of a full liquid tray on 7/27. Pt has been mainly NPO since admission on 7/23. If unable to have diet advanced in the next 24-48 hours, will need to consider TPN.  Per weight records, last recorded weight was 7/23. Unsure of weight status currently.  I/Os: +12.4L since admit UOP: 1430 ml x 24 hrs Drain: 375 ml Colostomy: 250 ml  Medications: Colace, Miralax, Lactated ringers, IV Mg sulfate, K-Phos  Labs reviewed:  CBGs: 122-143 Elevated Na Low Phos Elevated TG 163 -was on  Propofol this AM  NUTRITION - FOCUSED PHYSICAL EXAM:  No depletions noted.  Diet Order:   Diet Order             Diet NPO time specified  Diet effective now                   EDUCATION NEEDS:   No education needs have been identified at this time  Skin:  Skin Assessment: Skin Integrity Issues: Skin Integrity Issues:: Incisions Incisions: 7/28 abdomen  Last BM:  7/29 -type 7 -colostomy  Height:   Ht Readings from Last 1 Encounters:  10/24/20 '5\' 8"'$  (1.727 m)    Weight:   Wt Readings from Last 1 Encounters:  10/19/20 122 kg    BMI:  Body mass index is 40.9 kg/m.  Estimated Nutritional Needs:   Kcal:  2300-2500  Protein:  105-120g  Fluid:  2L/day   Clayton Bibles, MS, RD, LDN Inpatient Clinical Dietitian Contact information available via Amion

## 2020-10-25 NOTE — Consult Note (Signed)
NAME:  Mark Chang, MRN:  [id number], DOB:  1945-11-15, LOS: 5 ADMISSION DATE:  10/19/2020, CONSULTATION DATE:  10/25/20 REFERRING MD:  Anesthesia, CHIEF COMPLAINT:  SBO, hypotension   History of Present Illness:  Mark Chang is a 75 year old male with past medical history of type 2 diabetes, COPD, CKD stage III, mental delay who presented from nursing home with 2 weeks of intermittent abdominal pain and diarrhea.  He was found to have cecal obstruction, possibly due to mass, with pneumatosis and sepsis and was admitted to stepdown and treated with broad-spectrum antibiotics and conservative management.  Surgery recommended sigmoid colectomy for treatment of the sigmoid mass and evaluation of viability of the cecum with possible resection, however given patient's intellectual disabilities, consent was sought from patient's family.  They were unable to be contacted, therefore conservative management was continued.  On the morning of 7/28 patient was doing relatively well and tolerating a diet, however given rising white blood cell count abdominal x-Chang was obtained which showed free air under the diaphragm, likely from perforation.  Therefore patient was taken to the operating room emergently.   Post-op, pt was noted to have respiratory acidosis and so left intubated.      On arrival to the ICU, patient was on 6 mcg of Levophed and propofol.  Shortly after arrival to the unit, patient had rapid decompensation with bradycardia and SBP in the 40s off propofol and then converted to V. tach.  Patient never lost pulses, responded to high-dose Levophed, 2 mg of epi and 300 mg amiodarone.  Per report he received 3.5 L IV fluid in the OR.  Stat labs are pending.    Pertinent  Medical History   Past Medical History:  Diagnosis Date   CKD (chronic kidney disease), stage III ([place]) 03/24/2017   COPD (chronic obstructive pulmonary disease) ([place])    DM (diabetes mellitus), type 2 with renal complications ([place])  [id number]   Hyperlipidemia    Hypertension    Mental retardation    Obesity, Class III, BMI 40-49.9 (morbid obesity) ([place]) 03/24/2017   Pernicious anemia      Significant Hospital Events: Including procedures, antibiotic start and stop dates in addition to other pertinent events   7/24 admitted to hospitalist service with GI and surgery consulting, cecal obstruction and pneumatosis noted.  Started on cefepime and Flagyl 7/28-clinically improved, however rising white blood cell count prompted abdominal x-Chang which showed free air.  Taken emergently to the OR for colectomy with end colostomy, near code post ICU room arrival.  Right IJ double-lumen CVC placed in OR, left radial A-line placed 7/29 awake and responsive, trial SAT/SBT  Interim History / Subjective:  No acute events overnight, down to Levophed 44mg, awake and responsive Creatinine back up slightly this AM, though 1400cc UOP yesterday  Objective   Blood pressure (!) 116/48, pulse 80, temperature 99.1 F (37.3 C), temperature source Axillary, resp. rate 20, height '5\' 8"'$  (1.727 m), weight 122 kg, SpO2 98 %.    Vent Mode: PRVC FiO2 (%):  [30 %-100 %] 30 % Set Rate:  [15 bmp-20 bmp] 20 bmp Vt Set:  [540 mL] 540 mL PEEP:  [5 cmH20] 5 cmH20 Plateau Pressure:  [17 cmH20-18 cmH20] 18 cmH20   Intake/Output Summary (Last 24 hours) at 10/25/2020 0721 Last data filed at 10/25/2020 0600 Gross per 24 hour  Intake 8174.01 ml  Output 1765 ml  Net 6409.01 ml    Filed Weights   10/19/20 2019  Weight: 122 kg  General: Ill-appearing elderly male, intubated, awake, no distress HEENT: MM pink/moist, ETT in place, sclera anicteric, pupils equal Neuro: Nodding to questions, following commands, moving all extremities, pupils reactive CV: s1s2 RRR, no m/r/g PULM: On full vent support with PEEP of 5 and FiO2 35%, good air movement bilaterally with minimally decreased air entry in bilateral bases, no rhonchi or wheezing GI: soft, no  bowel sounds, new ostomy without bleeding, surgical site bandaged without signs of bleeding Extremities: warm/dry, no edema  Skin: no rashes or lesions   Resolved Hospital Problem list     Assessment & Plan:    Septic shock secondary to bowel perforation, cecal mass and likely ischemia POA Admitted with cecal mass several days ago, operative management delayed as patient has cognitive deficits and no family or POA could be located.  He appeared to be improving clinically with medical management and was advanced to a diet today.  Labs noted rising leukocytosis and abdominal x-Chang with free air, therefore was taken emergently to the operating room for colectomy with end ostomy. Had near code event on arrival to the ICU P: -Postop management per surgery -Continue Levophed to maintain MAP> 65, continue to down-titrate as able, adequately fluid resuscitated yesterday not 12 L positive -Continue cefepime and Flagyl, follow blood cultures    Acute respiratory failure, post-op In the setting of anesthesia and underlying COPD P: -Awake and following commands with minimal vent settings this morning.  SBT/SAT and work towards extubation --Maintain full vent support with SAT/SBT as tolerated -titrate Vent setting to maintain SpO2 greater than or equal to 90%. -HOB elevated 30 degrees. -Plateau pressures less than 30 cm H20.  -Follow chest x-Chang, ABG prn.   -Bronchial hygiene and RT/bronchodilator protocol.   NAGMA POA Possibly secondary to bicarb loss related to persistent diarrhea  Improved this morning P: -Bicarb 22 on metabolic panel this a.m., continue to monitor  Type 2 diabetes POA P: -Continue Levemir 15 units and SSI   Acute on chronic renal insufficiency  POA Creatinine up from 1.2 yesterday  to 1.49 today P: -Still making good urine, 1400 cc out yesterday -Continue to closely monitor renal indices, electrolytes and urine output -Avoid nephrotoxins as  able  Proximal atrial fibrillation with RVR, HFrEF Evaluated by cardiology, now signed off Echo 7/25 with EF 45 to 50% with grade 1 diastolic dysfunction P: -Beta-blocker in the setting of shock, continue amiodarone -Back in sinus rhythm, no anticoagulation postop   Diarrhea POA P: -C. difficile negative, continue to replete electrolytes and monitor metabolic acidosis  Hypokalemia, hypernatremia POA P: -Continue to follow electrolytes and replete as needed, mag up from 1.4 to 1.8 today, K3.6, sodium stable at 146   Baseline COPD Did not appear to be in acute exacerbation P: -Monitor, as needed nebulizers  Chronic cognitive deficit POA P: -Caseworker assisting with attempts to find POA which have thus far been unsuccessful.  May need state guardian -ethics consult has been placed  Past medical history of hypertension, hyperlipidemia -Hold Lasix, lisinopril, beta-blocker in the setting of shock and hypotension   Best Practice (right click and "Reselect all SmartList Selections" daily)   Diet/type: NPO DVT prophylaxis: SCD GI prophylaxis: PPI Lines: yes and it is still needed Foley:  Yes, and it is still needed Code Status:  full code Last date of multidisciplinary goals of care discussion [pending, unable to reach family or [place]   CBC: Recent Labs  Lab 10/19/20 2029 10/20/20 0355 10/22/20 0248 10/23/20 0241  10/24/20 0308 10/24/20 1430 10/24/20 1516 10/24/20 1609 10/25/20 0300  WBC 22.7*   < > 15.4* 19.9* 28.7*  --   --  11.5* 23.4*  NEUTROABS 18.6*  --   --   --   --   --   --   --   --   HGB 11.5*   < > 11.7* 10.9* 11.4* 8.8* 8.2* 9.5* 10.0*  HCT 36.0*   < > 37.3* 34.2* 35.5* 26.0* 24.0* 29.8* 30.6*  MCV 89.8   < > 90.3 87.7 89.0  --   --  89.8 86.7  PLT 384   < > 375 396 417*  --   --  276 414*   < > = values in this interval not displayed.     Basic Metabolic Panel: Recent Labs  Lab 10/21/20 0245 10/21/20 2153 10/22/20 0248  10/22/20 1656 10/23/20 0241 10/24/20 0308 10/24/20 1430 10/24/20 1516 10/24/20 1609 10/24/20 1614 10/25/20 0300  NA 139  --  146*  --  152* 150* 150* 150* 146*  --  146*  K 2.6*   < > 2.6*   < > 3.3* 2.9* 2.9* 2.8* 3.0*  --  3.6  CL 112*  --  122*  --  127* 122*  --   --  119*  --  119*  CO2 16*  --  17*  --  16* 23  --   --  21*  --  22  GLUCOSE 67*  --  126*  --  159* 104*  --   --  134*  --  158*  BUN 91*  --  69*  --  44* 30*  --   --  29*  --  33*  CREATININE 2.24*  --  1.64*  --  1.27* 1.22  --   --  1.22  --  1.49*  CALCIUM 8.1*  --  8.2*  --  8.6* 8.0*  --   --  7.1*  --  7.1*  MG 2.6*  --  2.6*  --  2.3 2.0  --   --   --  1.4* 1.8  PHOS 3.8  --  2.5  --  1.5* 1.7*  --   --   --   --  1.9*   < > = values in this interval not displayed.    GFR: Estimated Creatinine Clearance: 53.6 mL/min (A) (by C-G formula based on SCr of 1.49 mg/dL (H)). Recent Labs  Lab 10/19/20 2255 10/20/20 0027 10/20/20 0327 10/20/20 0355 10/21/20 0245 10/23/20 0241 10/24/20 0308 10/24/20 1609 10/25/20 0300  PROCALCITON  --   --   --  6.58  --   --   --   --   --   WBC  --   --   --  20.0*   < > 19.9* 28.7* 11.5* 23.4*  LATICACIDVEN 1.5 1.3 0.9  --   --   --   --  1.5  --    < > = values in this interval not displayed.     Liver Function Tests: Recent Labs  Lab 10/19/20 2029 10/20/20 0355 10/21/20 0245 10/24/20 0308 10/25/20 0300  AST 33 29 54* 35 23  ALT '26 25 30 30 18  '$ ALKPHOS 50 49 53 82 48  BILITOT 0.6 0.5 0.5 0.5 0.8  PROT 6.9 6.2* 6.1* 6.3* 5.0*  ALBUMIN 2.8* 2.7* 2.4* 2.1* 2.0*    No results for input(s): LIPASE, AMYLASE in the last 168 hours. No  results for input(s): AMMONIA in the last 168 hours.  ABG    Component Value Date/Time   PHART 7.375 10/24/2020 1709   PCO2ART 37.4 10/24/2020 1709   PO2ART 269 (H) 10/24/2020 1709   HCO3 21.3 10/24/2020 1709   TCO2 23 10/24/2020 1516   ACIDBASEDEF 2.9 (H) 10/24/2020 1709   O2SAT 99.8 10/24/2020 1709       Coagulation Profile: Recent Labs  Lab 10/19/20 2029 10/20/20 0355  INR 1.3* 1.2     Cardiac Enzymes: No results for input(s): CKTOTAL, CKMB, CKMBINDEX, TROPONINI in the last 168 hours.  HbA1C: Hgb A1c MFr Bld  Date/Time Value Ref Range Status  10/19/2020 08:29 PM 6.5 (H) 4.8 - 5.6 % Final    Comment:    (NOTE)         Prediabetes: 5.7 - 6.4         Diabetes: >6.4         Glycemic control for adults with diabetes: <7.0   03/23/2017 08:16 AM 9.9 (H) 4.8 - 5.6 % Final    Comment:    (NOTE) Pre diabetes:          5.7%-6.4% Diabetes:              >6.4% Glycemic control for   <7.0% adults with diabetes     CBG: Recent Labs  Lab 10/24/20 1106 10/24/20 1613 10/24/20 1926 10/24/20 2308 10/25/20 0305  GLUCAP 185* 143* 122* 143* 142*     Review of Systems:   Unable to obtain secondary to intubation and sedation  Past Medical History:  He,  has a past medical history of CKD (chronic kidney disease), stage III ([place]) (03/24/2017), COPD (chronic obstructive pulmonary disease) ([place]), DM (diabetes mellitus), type 2 with renal complications ([place]) ([id number]), Hyperlipidemia, Hypertension, Mental retardation, Obesity, Class III, BMI 40-49.9 (morbid obesity) ([place]) (03/24/2017), and Pernicious anemia.   Surgical History:  History reviewed. No pertinent surgical history.   Social History:   reports that he has never smoked. He has never used smokeless tobacco. He reports that he does not drink alcohol.   Family History:  His Family history is unknown by patient.   Allergies No Known Allergies   Home Medications  Prior to Admission medications   Medication Sig Start Date End Date Taking? Authorizing Provider  acetaminophen (TYLENOL) 500 MG tablet Take 500 mg by mouth 3 (three) times daily.   Yes [provider]  atorvastatin (LIPITOR) 40 MG tablet Take 40 mg by mouth at bedtime. 07/03/19  Yes [provider]  dextrose (GLUTOSE) 40 % GEL Take 1 Tube by  mouth once as needed for low blood sugar.   Yes [provider]  dicyclomine (BENTYL) 20 MG tablet Take 20 mg by mouth 2 (two) times daily. 10/17/20  Yes [provider]  docusate sodium (COLACE) 100 MG capsule Take 100 mg by mouth 2 (two) times daily.   Yes [provider]  escitalopram (LEXAPRO) 10 MG tablet Take 10 mg by mouth daily.   Yes [provider]  famotidine (PEPCID) 20 MG tablet Take 20 mg by mouth 2 (two) times daily. 06/12/19  Yes [provider]  Fenofibrate 50 MG CAPS Take 50 mg by mouth every evening. With food 09/27/20  Yes [provider]  furosemide (LASIX) 40 MG tablet Take 40 mg by mouth daily.   Yes [provider]  gabapentin (NEURONTIN) 100 MG capsule Take 100 mg by mouth 2 (two) times daily.  07/03/19  Yes [provider]  insulin aspart (NOVOLOG FLEXPEN) 100 UNIT/ML FlexPen Inject 8 Units into the skin 3 (three) times daily with meals.   Yes [provider]  Insulin Glargine (BASAGLAR KWIKPEN) 100 UNIT/ML Inject 40 Units into the skin every morning. 10/08/20  Yes [provider]  Insulin Glargine (BASAGLAR KWIKPEN) 100 UNIT/ML Inject 10 Units into the skin at bedtime.   Yes [provider]  lisinopril (PRINIVIL,ZESTRIL) 40 MG tablet Take 40 mg by mouth daily.    Yes [provider]  Melatonin 5 MG SUBL Take 10 mg by mouth at bedtime.   Yes [provider]  polyethylene glycol powder (GLYCOLAX/MIRALAX) 17 GM/SCOOP powder Take 17 g by mouth 2 (two) times daily as needed for mild constipation or moderate constipation. Mix with 8 oz of liquid   Yes [provider]  potassium chloride SA (K-DUR,KLOR-CON) 20 MEQ tablet Take 20 mEq by mouth daily.   Yes [provider]  TRULICITY 1.5 0000000 SOPN Inject 1.5 mg into the skin every Thursday. 06/13/19  Yes [provider]  vitamin B-12 (CYANOCOBALAMIN) 1000 MCG tablet Take 1,000 mcg by mouth daily.    Yes [provider]  insulin aspart (NOVOLOG) 100 UNIT/ML injection CBG 70 - 120: 0 units CBG 121 - 150: 2 units CBG 151 - 200: 3 units CBG 201 - 250: 5 units CBG 251 - 300: 8 units CBG 301 - 350: 11 units CBG 351 - 400: 15 units Patient not taking: No sig reported 07/23/16   Hosie Poisson, MD     Critical care time: 65 minutes     CRITICAL CARE Performed by: Otilio Carpen Collins Ker[place]   Total critical care time: 35 minutes  Critical care time was exclusive of separately billable procedures and treating other patients.  Critical care was necessary to treat or prevent imminent or life-threatening deterioration.  Critical care was time spent personally by me on the following activities: development of treatment plan with patient and/or surrogate as well as nursing, discussions with consultants, evaluation of patient's response to treatment, examination of patient, obtaining history from patient or surrogate, ordering and performing treatments and interventions, ordering and review of laboratory studies, ordering and review of radiographic studies, pulse oximetry and re-evaluation of patient's condition.   Otilio Carpen Noelia Lenart, PA-C [facility] See Amion for pager If no response to pager , please call [phone] until 7pm After 7:00 pm call Elink  [phone]?[place]

## 2020-10-26 ENCOUNTER — Inpatient Hospital Stay (HOSPITAL_COMMUNITY): Payer: Medicare HMO

## 2020-10-26 ENCOUNTER — Inpatient Hospital Stay: Payer: Self-pay

## 2020-10-26 DIAGNOSIS — R198 Other specified symptoms and signs involving the digestive system and abdomen: Secondary | ICD-10-CM | POA: Diagnosis not present

## 2020-10-26 LAB — PHOSPHORUS: Phosphorus: 2.4 mg/dL — ABNORMAL LOW (ref 2.5–4.6)

## 2020-10-26 LAB — CULTURE, BLOOD (ROUTINE X 2)
Culture: NO GROWTH
Special Requests: ADEQUATE

## 2020-10-26 LAB — COMPREHENSIVE METABOLIC PANEL
ALT: 29 U/L (ref 0–44)
AST: 70 U/L — ABNORMAL HIGH (ref 15–41)
Albumin: 1.9 g/dL — ABNORMAL LOW (ref 3.5–5.0)
Alkaline Phosphatase: 53 U/L (ref 38–126)
Anion gap: 8 (ref 5–15)
BUN: 30 mg/dL — ABNORMAL HIGH (ref 8–23)
CO2: 21 mmol/L — ABNORMAL LOW (ref 22–32)
Calcium: 7.5 mg/dL — ABNORMAL LOW (ref 8.9–10.3)
Chloride: 120 mmol/L — ABNORMAL HIGH (ref 98–111)
Creatinine, Ser: 1.2 mg/dL (ref 0.61–1.24)
GFR, Estimated: >60 mL/min (ref >60)
Glucose, Bld: 118 mg/dL — ABNORMAL HIGH (ref 70–99)
Potassium: 3.6 mmol/L (ref 3.5–5.1)
Sodium: 149 mmol/L — ABNORMAL HIGH (ref 135–145)
Total Bilirubin: 0.5 mg/dL (ref 0.3–1.2)
Total Protein: 5.3 g/dL — ABNORMAL LOW (ref 6.5–8.1)

## 2020-10-26 LAB — GLUCOSE, CAPILLARY
Glucose-Capillary: 109 mg/dL — ABNORMAL HIGH (ref 70–99)
Glucose-Capillary: 124 mg/dL — ABNORMAL HIGH (ref 70–99)
Glucose-Capillary: 142 mg/dL — ABNORMAL HIGH (ref 70–99)
Glucose-Capillary: 163 mg/dL — ABNORMAL HIGH (ref 70–99)
Glucose-Capillary: 199 mg/dL — ABNORMAL HIGH (ref 70–99)

## 2020-10-26 LAB — CBC
HCT: 29.3 % — ABNORMAL LOW (ref 39.0–52.0)
Hemoglobin: 9.6 g/dL — ABNORMAL LOW (ref 13.0–17.0)
MCH: 28.9 pg (ref 26.0–34.0)
MCHC: 32.8 g/dL (ref 30.0–36.0)
MCV: 88.3 fL (ref 80.0–100.0)
Platelets: 280 10*3/uL (ref 150–400)
RBC: 3.32 MIL/uL — ABNORMAL LOW (ref 4.22–5.81)
RDW: 15.8 % — ABNORMAL HIGH (ref 11.5–15.5)
WBC: 19.1 10*3/uL — ABNORMAL HIGH (ref 4.0–10.5)
nRBC: 0.2 % (ref 0.0–0.2)

## 2020-10-26 LAB — MAGNESIUM: Magnesium: 2.1 mg/dL (ref 1.7–2.4)

## 2020-10-26 MED ORDER — TRAVASOL 10 % IV SOLN
INTRAVENOUS | Status: AC
  Filled 2020-10-26: qty 528

## 2020-10-26 MED ORDER — LISINOPRIL 20 MG PO TABS
40.0000 mg | ORAL_TABLET | Freq: Every day | ORAL | Status: DC
  Administered 2020-10-26 – 2020-11-04 (×9): 40 mg via ORAL
  Filled 2020-10-26: qty 2
  Filled 2020-10-26: qty 4
  Filled 2020-10-26 (×3): qty 2
  Filled 2020-10-26 (×3): qty 4
  Filled 2020-10-26: qty 2

## 2020-10-26 MED ORDER — INSULIN ASPART 100 UNIT/ML IJ SOLN
0.0000 [IU] | INTRAMUSCULAR | Status: DC
  Administered 2020-10-26: 2 [IU] via SUBCUTANEOUS
  Administered 2020-10-26 (×3): 3 [IU] via SUBCUTANEOUS
  Administered 2020-10-27 (×2): 5 [IU] via SUBCUTANEOUS
  Administered 2020-10-27 (×3): 3 [IU] via SUBCUTANEOUS
  Administered 2020-10-28 (×2): 5 [IU] via SUBCUTANEOUS

## 2020-10-26 MED ORDER — SODIUM CHLORIDE 0.9% FLUSH
10.0000 mL | Freq: Two times a day (BID) | INTRAVENOUS | Status: DC
Start: 2020-10-26 — End: 2020-11-07
  Administered 2020-10-26 – 2020-11-06 (×15): 10 mL

## 2020-10-26 MED ORDER — DOCUSATE SODIUM 100 MG PO CAPS
100.0000 mg | ORAL_CAPSULE | Freq: Two times a day (BID) | ORAL | Status: DC
  Administered 2020-10-26 – 2020-11-06 (×13): 100 mg via ORAL
  Filled 2020-10-26 (×15): qty 1

## 2020-10-26 MED ORDER — INSULIN GLARGINE-YFGN 100 UNIT/ML ~~LOC~~ SOLN
20.0000 [IU] | Freq: Every day | SUBCUTANEOUS | Status: DC
  Administered 2020-10-26: 20 [IU] via SUBCUTANEOUS
  Filled 2020-10-26: qty 0.2

## 2020-10-26 MED ORDER — POLYETHYLENE GLYCOL 3350 17 G PO PACK
17.0000 g | PACK | Freq: Every day | ORAL | Status: DC
  Administered 2020-10-27: 17 g via ORAL
  Filled 2020-10-26: qty 1

## 2020-10-26 MED ORDER — HYDRALAZINE HCL 20 MG/ML IJ SOLN
5.0000 mg | INTRAMUSCULAR | Status: DC | PRN
  Administered 2020-10-26 – 2020-10-27 (×3): 5 mg via INTRAVENOUS
  Filled 2020-10-26 (×3): qty 1

## 2020-10-26 MED ORDER — SODIUM CHLORIDE 0.9% FLUSH
10.0000 mL | INTRAVENOUS | Status: DC | PRN
  Administered 2020-11-03: 10 mL

## 2020-10-26 MED ORDER — POTASSIUM CHLORIDE 2 MEQ/ML IV SOLN
INTRAVENOUS | Status: AC
  Filled 2020-10-26: qty 1000

## 2020-10-26 NOTE — Progress Notes (Signed)
PHARMACY - TOTAL PARENTERAL NUTRITION CONSULT NOTE   Indication: Prolonged ileus  Patient Measurements: Height: '5\' 8"'$  (172.7 cm) Weight: 122 kg (269 lb) IBW/kg (Calculated) : 68.4 TPN AdjBW (KG): 81.8 Body mass index is 40.9 kg/m.  Assessment: s/p right colectomy with anastomosis and left colectomy with end colostomy on 7/28 now to start TPN per CCS orders for prolonged ileus  Glucose / Insulin: CBGs at goal <150 prior to start of TPN, note patient with DM on Lantus 40 units daily and 10 units qhs prior to admission along with Novolog 8 units TID meal coverage, SSI and Trulicity once weekly Electrolytes: Na 149, Cl 120, phos 2.4, K 3.6, CO2 21 Renal: SCr 1.2, BUN 30 Hepatic: AST/ALT 70/29 Intake / Output; MIVF: n/a, currently on LR with 30 mEq KCL at rate 133m/hr GI Imaging: GI Surgeries / Procedures: see assessment above  Central access: 10/24/20 TPN start date: 10/26/20  Nutritional Goals (per RD recommendation on 10/26/20): kCal: 2300-2500, Protein: 105-120, Fluid: 2L/day  Goal TPN rate is 90 mL/hr (provides 119 g of protein and 2225 kcals per day)  Current Nutrition:  NPO  Plan:  Start TPN at 499mhr at 1800 Electrolytes in TPN: Na 3528mL, K 22m87m, Ca 5mEq93m Mg 5mEq/30mand Phos 15mmol18mMax Acetate Add standard MVI and trace elements to TPN Change to Moderate q4h SSI and adjust as needed and due to patient being on Lantus prior to admission as per above, will start Lantus 20 units qhs tonight Reduce MIVF to 50 mL/hr at 1800 Monitor TPN labs on Mon/Thurs and as needed  Jannet Calip, Kara Mead022,9:34 AM

## 2020-10-26 NOTE — Evaluation (Signed)
Occupational Therapy Evaluation Patient Details Name: Mark Chang MRN: [id number] DOB: 05/30/1945 Today's Date: 10/26/2020    History of Present Illness Mark Chang is a 75 year old male with a cognitive disability who presented with large bowel obstruction due to 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 Patient developed free air and required a right colectomy with anastomosis and left colectomy with end colostomy on 10/24/2020.   Clinical Impression   Mark Chang is a 75 year old man s/p abdominal surgery who presents with generalized weakness, decreased activity tolerance, impaired balance and intellectual disability. At baseline patient reports able to perform BADLs and ambulating with walker at ALF. Today patient required +2 physical assistance for transfers, a right lateral lean, max-total assistance for UB and LB ADLs and toileting. Patient will benefit from skilled OT services while in hospital to improve deficits and learn compensatory strategies as needed in order to return to PLOF.      Follow Up Recommendations  SNF    Equipment Recommendations  None recommended by OT    Recommendations for Other Services       Precautions / Restrictions Precautions Precautions: Other (comment) Precaution Comments: abdominal incision, colostomy, JP drain Restrictions Weight Bearing Restrictions: No      Mobility Bed Mobility Overal bed mobility: Needs Assistance Bed Mobility: Supine to Sit     Supine to sit: +2 for safety/equipment;HOB elevated;Mod assist     General bed mobility comments: Mod assist for trunk lift off and negotiation to edge of bed.    Transfers Overall transfer level: Needs assistance Equipment used: Rolling walker (2 wheeled) Transfers: Sit to/from [facility] Sit to Stand: Max assist;+2 physical assistance;+2 safety/equipment Stand pivot transfers: Max assist;+2 physical assistance;+2 safety/equipment       General transfer  comment: Patient heavily leaning to the right, not using walker effectively, max x 2 to stand and transfer to recliner - difficulty following commands to stay upright and take steps.    Balance Overall balance assessment: Needs assistance Sitting-balance support: Bilateral upper extremity supported Sitting balance-Leahy Scale: Fair   Postural control: Right lateral lean Standing balance support: Bilateral upper extremity supported;During functional activity Standing balance-Leahy Scale: Poor                             ADL either performed or assessed with clinical judgement   ADL Overall ADL's : Needs assistance/impaired Eating/Feeding: Set up;Sitting   Grooming: Set up;Sitting   Upper Body Bathing: Set up;Moderate assistance Upper Body Bathing Details (indicate cue type and reason): needed assistance due to bandages, lines/leads Lower Body Bathing: Maximal assistance;Bed level   Upper Body Dressing : Maximal assistance;Sitting   Lower Body Dressing: Total assistance;+2 for physical assistance;Bed level   Toilet Transfer: +2 for physical assistance;+2 for safety/equipment;Maximal assistance;BSC;Stand-pivot   Toileting- Clothing Manipulation and Hygiene: Total assistance;Sit to/from stand;+2 for physical assistance               Vision   Vision Assessment?: No apparent visual deficits     Perception     Praxis      Pertinent Vitals/Pain Pain Assessment: No/denies pain     Hand Dominance Right   Extremity/Trunk Assessment Upper Extremity Assessment Upper Extremity Assessment: RUE deficits/detail;LUE deficits/detail RUE Deficits / Details: Grossly 3+/5 strength in upper extremity RUE Coordination: decreased fine motor LUE Deficits / Details: Grossly 3+/5 strength in upper extremity LUE Sensation: WNL LUE Coordination: WNL   Lower Extremity  Assessment Lower Extremity Assessment: Defer to PT evaluation   Cervical / Trunk Assessment Cervical /  Trunk Assessment: Kyphotic   Communication Communication Communication: No difficulties   Cognition Arousal/Alertness: Awake/alert Behavior During Therapy: WFL for tasks assessed/performed Overall Cognitive Status: History of cognitive impairments - at baseline                                     General Comments       Exercises     Shoulder Instructions      Home Living Family/patient expects to be discharged to:: Skilled nursing facility                             Home Equipment: Walker - 4 wheels          Prior Functioning/Environment Level of Independence: Needs assistance  Gait / Transfers Assistance Needed: ambulates with walker/rollator ADL's / Homemaking Assistance Needed: able to bathe and dress himself            OT Problem List: Decreased strength;Decreased activity tolerance;Impaired balance (sitting and/or standing);Decreased cognition;Decreased safety awareness;Decreased knowledge of use of DME or AE;Decreased knowledge of precautions;Obesity      OT Treatment/Interventions: Self-care/ADL training;Therapeutic exercise;Patient/family education;Cognitive remediation/compensation;Therapeutic activities;DME and/or AE instruction;Balance training    OT Goals(Current goals can be found in the care plan section) Acute Rehab OT Goals OT Goal Formulation: Patient unable to participate in goal setting Time For Goal Achievement: 11/09/20 Potential to Achieve Goals: Good  OT Frequency: Min 2X/week   Barriers to D/C:            Co-evaluation PT/OT/SLP Co-Evaluation/Treatment: Yes Reason for Co-Treatment: For patient/therapist safety;To address functional/ADL transfers          AM-PAC OT "6 Clicks" Daily Activity     Outcome Measure Help from another person eating meals?: A Little Help from another person taking care of personal grooming?: A Little Help from another person toileting, which includes using toliet, bedpan, or  urinal?: Total Help from another person bathing (including washing, rinsing, drying)?: A Lot Help from another person to put on and taking off regular upper body clothing?: A Lot Help from another person to put on and taking off regular lower body clothing?: Total 6 Click Score: 12   End of Session Equipment Utilized During Treatment: Rolling walker;Gait belt Nurse Communication: Mobility status  Activity Tolerance: Patient tolerated treatment well Patient left: in chair;with call bell/phone within reach;with chair alarm set  OT Visit Diagnosis: Unsteadiness on feet (R26.81);Muscle weakness (generalized) (M62.81)                Time: [id number] OT Time Calculation (min): 32 min Charges:  OT General Charges $OT Visit: 1 Visit OT Evaluation $OT Eval Moderate Complexity: 1 Mod  Mays Paino, OTR/L [place]  Office [phone] Pager: [place] 10/26/2020, 12:22 PM

## 2020-10-26 NOTE — Plan of Care (Signed)

## 2020-10-26 NOTE — Progress Notes (Signed)
PROGRESS NOTE    Mark Chang  [id number] DOB: May 02, 1945 DOA: 10/19/2020 PCP: Terrill Mohr, NP     Brief Narrative:  Mark Chang is a 75 year old male with past medical history significant for type 2 diabetes mellitus, essential hypertension, CKD stage IIIa, intellectual disability who resides at SNF who initially presented to [facility] ED with complaint of abdominal pain, diarrhea and was found to be hypotensive.  Per SNF staff, he reported abdominal pain for the last few weeks.     In the ED, he was found to have a blood pressure of 68/40, temperature 100.5 F, HR 105, lactic acid 2.2, WBC count 22.7.  CT abdomen/pelvis showed minimal amount of free air, cecal mass probably sigmoid colon with concern for perforated viscus.  Gastroenterology and general surgery were consulted.  Patient admitted for management of abdominal pain secondary to cecal mass with likely perforated viscus.  Patient was initially monitored with conservative management, could not get a hold of family or legal guardian.  However, patient continued to clinically decline and patient ultimately underwent right colectomy with anastomosis, left colectomy with end colostomy by [name] on 7/28.  Postoperatively, patient remained in respiratory acidosis and was left on mechanical ventilation, required pressors.  He was transferred to ICU.  He was extubated 7/29 and transferred back to [facility] service on 7/30.  New events last 24 hours / Subjective: NG tube was dislodged this morning, reinserted.  Per general surgery, no need for continued NG support, can advance to clear liquids.  Patient remains at risk of developing ileus.  No other new events overnight.  Assessment & Plan:   Principal Problem:   Perforated abdominal viscus Active Problems:   Acute respiratory failure (HCC)   Intellectual disability   AKI (acute kidney injury) ([place])   Sepsis ([place])   DM (diabetes mellitus), type 2 with renal complications (HCC)    CKD (chronic kidney disease), stage III (HCC)   Mass of cecum   Pneumatosis coli   Colon obstruction (HCC)    Septic shock in the setting of perforated abdominal viscus -Status post right colectomy with anastomosis, left colectomy with end colostomy by [name] 7/28 -Advance to clear liquids, patient remains at risk for developing postop ileus -Hemodynamically improved -TPN to start today -Cefepime, Flagyl due to abdominal fecal contamination during surgery, continue until WBC normalizes   Proximal atrial fibrillation with RVR -Cardiology signed off 7/27 -Continue amiodarone gtt, Coreg   AKI on CKD stage IIIa -Resolved   Essential hypertension -Continue amlodipine, Coreg   Type 2 diabetes mellitus -Semglee, sliding scale insulin      DVT prophylaxis:  enoxaparin (LOVENOX) injection 40 mg Start: 10/25/20 1400 Place and maintain sequential compression device Start: 10/22/20 0852 SCDs Start: 10/20/20 0026  Code Status:     Code Status Orders  (From admission, onward)           Start     Ordered   10/20/20 0026  Full code  Continuous        10/20/20 0028           Code Status History     Date Active Date Inactive Code Status Order ID Comments User Context   03/20/2017 2118 03/24/2017 2249 Full Code [id number]  Vianne Bulls, MD ED   07/18/2016 2100 07/23/2016 1735 Full Code [id number]  Luz Brazen, MD ED      Family Communication: No family at bedside, could not reach brother on both numbers listed today  Disposition  Plan:  Status is: Inpatient  Remains inpatient appropriate because:IV treatments appropriate due to intensity of illness or inability to take PO and Inpatient level of care appropriate due to severity of illness  Dispo: The patient is from: SNF              Anticipated d/c is to: SNF              Patient currently is not medically stable to d/c.   Difficult to place patient No    Antimicrobials:  Anti-infectives (From  admission, onward)    Start     Dose/Rate Route Frequency Ordered Stop   10/25/20 1400  ceFEPIme (MAXIPIME) 2 g in sodium chloride 0.9 % 100 mL IVPB        2 g 200 mL/hr over 30 Minutes Intravenous Every 12 hours 10/25/20 0729     10/23/20 1000  ceFEPIme (MAXIPIME) 2 g in sodium chloride 0.9 % 100 mL IVPB  Status:  Discontinued        2 g 200 mL/hr over 30 Minutes Intravenous Every 8 hours 10/23/20 0833 10/25/20 0729   10/21/20 1000  ceFEPIme (MAXIPIME) 2 g in sodium chloride 0.9 % 100 mL IVPB  Status:  Discontinued        2 g 200 mL/hr over 30 Minutes Intravenous Every 12 hours 10/21/20 0742 10/23/20 0833   10/21/20 0600  metroNIDAZOLE (FLAGYL) IVPB 500 mg        500 mg 100 mL/hr over 60 Minutes Intravenous Every 8 hours 10/20/20 0028     10/20/20 2200  ceFEPIme (MAXIPIME) 2 g in sodium chloride 0.9 % 100 mL IVPB  Status:  Discontinued        2 g 200 mL/hr over 30 Minutes Intravenous Every 24 hours 10/20/20 0048 10/21/20 0742   10/19/20 2030  ceFEPIme (MAXIPIME) 2 g in sodium chloride 0.9 % 100 mL IVPB        2 g 200 mL/hr over 30 Minutes Intravenous  Once 10/19/20 2029 10/20/20 0033   10/19/20 2030  metroNIDAZOLE (FLAGYL) IVPB 500 mg        500 mg 100 mL/hr over 60 Minutes Intravenous  Once 10/19/20 2029 10/19/20 2210        Objective: Vitals:   10/26/20 1043 10/26/20 1044 10/26/20 1045 10/26/20 1046  BP:      Pulse: 90 90 94 94  Resp: 20 (!) 35 (!) 28 (!) 28  Temp:      TempSrc:      SpO2: 100% 97% 98% 98%  Weight:      Height:        Intake/Output Summary (Last 24 hours) at 10/26/2020 1203 Last data filed at 10/26/2020 1057 Gross per 24 hour  Intake 1880.73 ml  Output 3675 ml  Net -1794.27 ml   Filed Weights   10/19/20 2019  Weight: 122 kg    Examination:  General exam: Appears calm and comfortable  Respiratory system: Clear to auscultation. Respiratory effort normal. No respiratory distress.  Cardiovascular system: S1 & S2 heard. No murmurs. No pedal  edema. Gastrointestinal system: Abdomen is nondistended, soft and nontender. +Colostomy with liquid stool present  Central nervous system: Alert  Extremities: Symmetric in appearance  Skin: No rashes, lesions or ulcers on exposed skin    Data Reviewed: I have personally reviewed following labs and imaging studies  CBC: Recent Labs  Lab 10/19/20 2029 10/20/20 0355 10/23/20 0241 10/24/20 0308 10/24/20 1430 10/24/20 1516 10/24/20 1609 10/25/20 0300 10/26/20 0431  WBC 22.7*   < > 19.9* 28.7*  --   --  11.5* 23.4* 19.1*  NEUTROABS 18.6*  --   --   --   --   --   --   --   --   HGB 11.5*   < > 10.9* 11.4* 8.8* 8.2* 9.5* 10.0* 9.6*  HCT 36.0*   < > 34.2* 35.5* 26.0* 24.0* 29.8* 30.6* 29.3*  MCV 89.8   < > 87.7 89.0  --   --  89.8 86.7 88.3  PLT 384   < > 396 417*  --   --  276 414* 280   < > = values in this interval not displayed.   Basic Metabolic Panel: Recent Labs  Lab 10/22/20 0248 10/22/20 1656 10/23/20 0241 10/24/20 0308 10/24/20 1430 10/24/20 1516 10/24/20 1609 10/24/20 1614 10/25/20 0300 10/26/20 0431  NA 146*  --  152* 150* 150* 150* 146*  --  146* 149*  K 2.6*   < > 3.3* 2.9* 2.9* 2.8* 3.0*  --  3.6 3.6  CL 122*  --  127* 122*  --   --  119*  --  119* 120*  CO2 17*  --  16* 23  --   --  21*  --  22 21*  GLUCOSE 126*  --  159* 104*  --   --  134*  --  158* 118*  BUN 69*  --  44* 30*  --   --  29*  --  33* 30*  CREATININE 1.64*  --  1.27* 1.22  --   --  1.22  --  1.49* 1.20  CALCIUM 8.2*  --  8.6* 8.0*  --   --  7.1*  --  7.1* 7.5*  MG 2.6*  --  2.3 2.0  --   --   --  1.4* 1.8 2.1  PHOS 2.5  --  1.5* 1.7*  --   --   --   --  1.9* 2.4*   < > = values in this interval not displayed.   GFR: Estimated Creatinine Clearance: 66.5 mL/min (by C-G formula based on SCr of 1.2 mg/dL). Liver Function Tests: Recent Labs  Lab 10/20/20 0355 10/21/20 0245 10/24/20 0308 10/25/20 0300 10/26/20 0431  AST 29 54* 35 23 70*  ALT '25 30 30 18 29  '$ ALKPHOS 49 53 82 48 53   BILITOT 0.5 0.5 0.5 0.8 0.5  PROT 6.2* 6.1* 6.3* 5.0* 5.3*  ALBUMIN 2.7* 2.4* 2.1* 2.0* 1.9*   No results for input(s): LIPASE, AMYLASE in the last 168 hours. No results for input(s): AMMONIA in the last 168 hours. Coagulation Profile: Recent Labs  Lab 10/19/20 2029 10/20/20 0355  INR 1.3* 1.2   Cardiac Enzymes: No results for input(s): CKTOTAL, CKMB, CKMBINDEX, TROPONINI in the last 168 hours. BNP (last 3 results) No results for input(s): PROBNP in the last 8760 hours. HbA1C: No results for input(s): HGBA1C in the last 72 hours. CBG: Recent Labs  Lab 10/25/20 1947 10/25/20 2342 10/26/20 0441 10/26/20 0802 10/26/20 1149  GLUCAP 141* 130* 109* 124* 142*   Lipid Profile: Recent Labs    10/25/20 0300  TRIG 163*   Thyroid Function Tests: No results for input(s): TSH, T4TOTAL, FREET4, T3FREE, THYROIDAB in the last 72 hours. Anemia Panel: No results for input(s): VITAMINB12, FOLATE, FERRITIN, TIBC, IRON, RETICCTPCT in the last 72 hours. Sepsis Labs: Recent Labs  Lab 10/19/20 2255 10/20/20 0027 10/20/20 0327 10/20/20 0355 10/24/20 1609  PROCALCITON  --   --   --  6.58  --   LATICACIDVEN 1.5 1.3 0.9  --  1.5    Recent Results (from the past 240 hour(s))  Resp Panel by RT-PCR (Flu A&B, Covid) Nasopharyngeal Swab     Status: None   Collection Time: 10/19/20  8:29 PM   Specimen: Nasopharyngeal Swab; Nasopharyngeal(NP) swabs in vial transport medium  Result Value Ref Range Status   SARS Coronavirus 2 by RT PCR NEGATIVE NEGATIVE Final    Comment: (NOTE) SARS-CoV-2 target nucleic acids are NOT DETECTED.  The SARS-CoV-2 RNA is generally detectable in upper respiratory specimens during the acute phase of infection. The lowest concentration of SARS-CoV-2 viral copies this assay can detect is 138 copies/mL. A negative result does not preclude SARS-Cov-2 infection and should not be used as the sole basis for treatment or other patient management decisions. A negative  result may occur with  improper specimen collection/handling, submission of specimen other than nasopharyngeal swab, presence of viral mutation(s) within the areas targeted by this assay, and inadequate number of viral copies(<138 copies/mL). A negative result must be combined with clinical observations, patient history, and epidemiological information. The expected result is Negative.  Fact Sheet for Patients:  EntrepreneurPulse.com.au  Fact Sheet for Healthcare Providers:  IncredibleEmployment.be  This test is no t yet approved or cleared by the [place] FDA and  has been authorized for detection and/or diagnosis of SARS-CoV-2 by FDA under an Emergency Use Authorization (EUA). This EUA will remain  in effect (meaning this test can be used) for the duration of the COVID-19 declaration under Section 564(b)(1) of the Act, 21 U.S.C.section 360bbb-3(b)(1), unless the authorization is terminated  or revoked sooner.       Influenza A by PCR NEGATIVE NEGATIVE Final   Influenza B by PCR NEGATIVE NEGATIVE Final    Comment: (NOTE) The Xpert Xpress SARS-CoV-2/FLU/RSV plus assay is intended as an aid in the diagnosis of influenza from Nasopharyngeal swab specimens and should not be used as a sole basis for treatment. Nasal washings and aspirates are unacceptable for Xpert Xpress SARS-CoV-2/FLU/RSV testing.  Fact Sheet for Patients: EntrepreneurPulse.com.au  Fact Sheet for Healthcare Providers: IncredibleEmployment.be  This test is not yet approved or cleared by the [place] FDA and has been authorized for detection and/or diagnosis of SARS-CoV-2 by FDA under an Emergency Use Authorization (EUA). This EUA will remain in effect (meaning this test can be used) for the duration of the COVID-19 declaration under Section 564(b)(1) of the Act, 21 U.S.C. section 360bbb-3(b)(1), unless the authorization is  terminated or revoked.  Performed at [facility], [place] [address], [place], [place] [address]   Blood Culture (routine x 2)     Status: None   Collection Time: 10/19/20  8:29 PM   Specimen: BLOOD  Result Value Ref Range Status   Specimen Description   Final    BLOOD BLOOD LEFT ARM Performed at [place] [address] [place], [place] [address]    Special Requests   Final    BOTTLES DRAWN AEROBIC AND ANAEROBIC Blood Culture adequate volume Performed at [place] [address], [place], [place] [address]    Culture   Final    NO GROWTH 5 DAYS Performed at [facility], [place] [address], [place], [place] [address]    Report Status 10/24/2020 FINAL  Final  Urine Culture     Status: None   Collection Time: 10/19/20  8:29 PM   Specimen: In/Out Cath Urine  Result Value Ref Range Status   Specimen Description   Final    IN/OUT CATH URINE Performed at [place] [address] [place], [place] [address]    Special Requests   Final    NONE Performed at [facility], [place] [address] [place], [place] [address]    Culture   Final    NO GROWTH Performed at [facility], [place] [address], [place], [place] [address]    Report Status 10/21/2020 FINAL  Final  Blood Culture (routine x 2)     Status: None (Preliminary result)   Collection Time: 10/20/20  4:00 AM   Specimen: BLOOD  Result Value Ref Range Status   Specimen Description   Final    BLOOD BLOOD RIGHT ARM Performed at [place] [address] [place], [place] [address]    Special Requests   Final    BOTTLES DRAWN AEROBIC AND ANAEROBIC Blood Culture adequate volume Performed at [place] [address], [place], [place] [address]    Culture   Final    NO GROWTH 4 DAYS Performed at [facility], [place] [address] [place], [place] [address]    Report Status  PENDING  Incomplete  MRSA Next Gen by PCR, Nasal     Status: None   Collection Time: 10/20/20  7:35 AM   Specimen: Nasal Mucosa; Nasal Swab  Result Value Ref Range Status   MRSA by PCR Next Gen NOT DETECTED NOT DETECTED Final    Comment: (NOTE) The GeneXpert MRSA Assay (FDA approved for NASAL specimens only), is one component of a comprehensive MRSA colonization surveillance program. It is not intended to diagnose MRSA infection nor to guide or monitor treatment for MRSA infections. Test performance is not FDA approved in patients less than 77 years old. Performed at [facility], [place] [address] [place], [place] [address]   C Difficile Quick Screen w PCR reflex     Status: None   Collection Time: 10/20/20  4:58 PM   Specimen: STOOL  Result Value Ref Range Status   C Diff antigen NEGATIVE NEGATIVE Final   C Diff toxin NEGATIVE NEGATIVE Final   C Diff interpretation No C. difficile detected.  Final    Comment: Performed at [facility], [place] [address] [place], [place] [address]      Radiology Studies: DG Abd 1 View  Result Date: 10/26/2020 CLINICAL DATA:  Encounter for NG tube placement EXAM: ABDOMEN - 1 VIEW COMPARISON:  10/24/20 FINDINGS: Interval placement of nasogastric tube. The tip and side port project over the body of the stomach. Calcified gallstones are noted measuring up to 1.1 cm. IMPRESSION: Satisfactory placement of nasogastric tube with tip and side port in the expected location of the body of stomach. Electronically Signed   By: Kerby Moors M.D.   On: 10/26/2020 09:12   DG CHEST PORT 1 VIEW  Result Date: 10/24/2020 CLINICAL DATA:  Hypertension. EXAM: PORTABLE CHEST 1 VIEW COMPARISON:  Same day. FINDINGS: The heart size and mediastinal contours are within normal limits. Endotracheal and nasogastric tubes appear to be in grossly good position. Right internal jugular catheter is noted with tip in expected position of the SVC. Left  lung is clear. Elevated right hemidiaphragm is noted with minimal right basilar subsegmental atelectasis. Minimal right pneumoperitoneum is noted as described on prior radiograph. The visualized skeletal structures are unremarkable. IMPRESSION: Endotracheal and nasogastric tubes are in  good position. Right internal jugular catheter is in good position. Elevated right hemidiaphragm is noted with minimal right basilar subsegmental atelectasis. Minimal amount of pneumoperitoneum is noted under right hemidiaphragm is noted on prior exam. Electronically Signed   By: Marijo Conception M.D.   On: 10/24/2020 19:23      Scheduled Meds:  Chlorhexidine Gluconate Cloth  6 each Topical Daily   docusate  100 mg Per Tube BID   enoxaparin (LOVENOX) injection  40 mg Subcutaneous Q24H   fentaNYL (SUBLIMAZE) injection  25 mcg Intravenous Once   insulin aspart  0-15 Units Subcutaneous Q4H   insulin glargine-yfgn  20 Units Subcutaneous QHS   mouth rinse  15 mL Mouth Rinse BID   pneumococcal 23 valent vaccine  0.5 mL Intramuscular Tomorrow-1000   polyethylene glycol  17 g Per Tube Daily   Continuous Infusions:  amiodarone 30 mg/hr (10/26/20 0955)   ceFEPime (MAXIPIME) IV Stopped (10/26/20 0300)   lactated ringers with kcl 100 mL/hr at 10/26/20 0506   lactated ringers with kcl     methocarbamol (ROBAXIN) IV Stopped (10/26/20 0530)   metronidazole Stopped (10/26/20 0933)   TPN ADULT (ION)       LOS: 6 days      Time spent: 25 minutes   Dessa Phi, DO [facility] 10/26/2020, 12:03 PM   Available via Epic secure chat 7am-7pm After these hours, please refer to coverage provider listed on [email or web address]

## 2020-10-26 NOTE — Evaluation (Signed)
Clinical/Bedside Swallow Evaluation Patient Details  Name: Mark Chang MRN: [id number] Date of Birth: Jan 31, 1946  Today's Date: 10/26/2020 Time: SLP Start Time (ACUTE ONLY): 0820 SLP Stop Time (ACUTE ONLY): 0840 SLP Time Calculation (min) (ACUTE ONLY): 20 min  Past Medical History:  Past Medical History:  Diagnosis Date   CKD (chronic kidney disease), stage III ([place]) 03/24/2017   COPD (chronic obstructive pulmonary disease) ([place])    DM (diabetes mellitus), type 2 with renal complications ([place]) [id number]   Hyperlipidemia    Hypertension    Mental retardation    Obesity, Class III, BMI 40-49.9 (morbid obesity) ([place]) 03/24/2017   Pernicious anemia    Past Surgical History:  Past Surgical History:  Procedure Laterality Date   COLON RESECTION SIGMOID N/A 10/24/2020   Procedure: EXPLORATORY LAPAROTOMY, RIGHT COLECTOMY WITH ANASTOMOSIS. LEFT COLECTOMY WITH COLOSTOMY, MOBILIZATION OF SPLENIC FLEXURE;  Surgeon: Stechschulte, Nickola Major, MD;  Location: [facility] ORS;  Service: General;  Laterality: N/A;   HPI:  Patient is a 75 y.o. male with PMH: DM-2, intellectual disability, CKD-3 who presented to ED from SNF with c/o abdominal pain, diarrhea, low BP. In ED, patient was septic, initial BP 90/57, improved to 117/61 with IVF bolus. 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 He initially improved with medical management but then developed free air, so he had emergent right colectomy with anastomosis and left colectomy with end colostomy on 7/28. He is at high risk for ileus.   Assessment / Plan / Recommendation Clinical Impression  Patient presents with a mild oropharyngeal dysphagia with suspected esophageal component. No overt s/s aspiration or penetration observed even with patient taking large consecutive straw sips of thin liquids. Mild amount of belching observed post swallows was observed. SLP suspects mild swallow initiation delay at  pharyngeal phase of swallow but risk of aspiration is judged to be mild at this point. Patient is edentulous and does not have dentures per his report. Mastication of small piece of saltine cracker was mildly prolonged. SLP is recommending Dys 2 solids, thin liquids when cleared by surgery. Per MD, surgery is recommending to keep him on clear liquids only at this time. SLP Visit Diagnosis: Dysphagia, unspecified (R13.10)    Aspiration Risk  Mild aspiration risk    Diet Recommendation Dysphagia 2 (Fine chop);Thin liquid   Liquid Administration via: Cup;Straw Medication Administration: Whole meds with puree Supervision: Full supervision/cueing for compensatory strategies;Staff to assist with self feeding;Patient able to self feed Compensations: Minimize environmental distractions;Slow rate;Small sips/bites Postural Changes: Seated upright at 90 degrees    Other  Recommendations Oral Care Recommendations: Oral care BID;Staff/trained caregiver to provide oral care   Follow up Recommendations None      Frequency and Duration min 1 x/week  1 week       Prognosis Prognosis for Safe Diet Advancement: Good      Swallow Study   General Date of Onset: 10/20/20 HPI: Patient is a 75 y.o. male with PMH: DM-2, intellectual disability, CKD-3 who presented to ED from SNF with c/o abdominal pain, diarrhea, low BP. In ED, patient was septic, initial BP 90/57, improved to 117/61 with IVF bolus. 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 He initially improved with medical management but then developed free air, so he had emergent right colectomy with anastomosis and left colectomy with end colostomy on 7/28. He is at high risk for  ileus. Type of Study: Bedside Swallow Evaluation Previous Swallow Assessment: none found Diet Prior to this Study: NPO Temperature Spikes Noted: No Respiratory Status: Room air History of Recent Intubation: Yes Length  of Intubations (days): 2 days Date extubated: 10/25/20 Behavior/Cognition: Alert;Cooperative;Pleasant mood Oral Cavity Assessment: Within Functional Limits Oral Care Completed by SLP: Yes Oral Cavity - Dentition: Edentulous Vision: Functional for self-feeding Self-Feeding Abilities: Needs set up;Needs assist Patient Positioning: Upright in bed Baseline Vocal Quality: Normal Volitional Cough: Cognitively unable to elicit Volitional Swallow: Unable to elicit    Oral/Motor/Sensory Function Overall Oral Motor/Sensory Function: Within functional limits   Ice Chips     Thin Liquid Thin Liquid: Impaired Presentation: Straw;Self Fed Pharyngeal  Phase Impairments: Suspected delayed Swallow Other Comments: no overt s/s aspiration or penetration; SLP suspects mildly delayed swallow initiation    Nectar Thick     Honey Thick     Puree Puree: Within functional limits Presentation: Self Fed;Spoon   Solid     Solid: Impaired Oral Phase Impairments: Impaired mastication Oral Phase Functional Implications: Prolonged oral transit     Sonia Baller, MA, CCC-SLP Speech Therapy

## 2020-10-26 NOTE — Progress Notes (Signed)
Progress Note: General Surgery Service   Chief Complaint/Subjective: Remains off pressors. Afebrile, WBC slowly downtrending. NG tube dislodged this morning.   Objective: Vital signs in last 24 hours: Temp:  [97.5 F (36.4 C)-99.5 F (37.5 C)] 98.1 F (36.7 C) (07/30 0740) Pulse Rate:  [87-98] 94 (07/29 2000) Resp:  [19-33] 27 (07/29 2000) BP: (96-158)/(53-66) 145/61 (07/29 2000) SpO2:  [93 %-99 %] 93 % (07/29 2000) Arterial Line BP: (115-161)/(48-67) 138/52 (07/29 1430) FiO2 (%):  [30 %] 30 % (07/29 0833) Last BM Date: 10/25/20  Intake/Output from previous day: 07/29 0701 - 07/30 0700 In: 2323.7 [I.V.:1384.2; IV Piggyback:939.5] Out: [id number] [Urine:2000; Drains:985; Stool:520] Intake/Output this shift: Total I/O In: -  Out: 40 [Drains:40]  General: resting in bed, NAD CV: irregular rhythm GI: Abd end colostomy viable with liquid stool in appliance, midline incision c/d/I with staples and penrose drain with serosanguinous drainage exiting lower aspect of incision.  Pelvic JP drain exiting right abdomen with serosanguinous drainage  Lab Results: CBC  Recent Labs    10/25/20 0300 10/26/20 0431  WBC 23.4* 19.1*  HGB 10.0* 9.6*  HCT 30.6* 29.3*  PLT 414* 280   BMET Recent Labs    10/25/20 0300 10/26/20 0431  NA 146* 149*  K 3.6 3.6  CL 119* 120*  CO2 22 21*  GLUCOSE 158* 118*  BUN 33* 30*  CREATININE 1.49* 1.20  CALCIUM 7.1* 7.5*   PT/INR No results for input(s): LABPROT, INR in the last 72 hours. ABG Recent Labs    10/24/20 1709 10/25/20 1330  PHART 7.375 7.420  HCO3 21.3 18.4*    Anti-infectives: Anti-infectives (From admission, onward)    Start     Dose/Rate Route Frequency Ordered Stop   10/25/20 1400  ceFEPIme (MAXIPIME) 2 g in sodium chloride 0.9 % 100 mL IVPB        2 g 200 mL/hr over 30 Minutes Intravenous Every 12 hours 10/25/20 0729     10/23/20 1000  ceFEPIme (MAXIPIME) 2 g in sodium chloride 0.9 % 100 mL IVPB  Status:  Discontinued         2 g 200 mL/hr over 30 Minutes Intravenous Every 8 hours 10/23/20 0833 10/25/20 0729   10/21/20 1000  ceFEPIme (MAXIPIME) 2 g in sodium chloride 0.9 % 100 mL IVPB  Status:  Discontinued        2 g 200 mL/hr over 30 Minutes Intravenous Every 12 hours 10/21/20 0742 10/23/20 0833   10/21/20 0600  metroNIDAZOLE (FLAGYL) IVPB 500 mg        500 mg 100 mL/hr over 60 Minutes Intravenous Every 8 hours 10/20/20 0028     10/20/20 2200  ceFEPIme (MAXIPIME) 2 g in sodium chloride 0.9 % 100 mL IVPB  Status:  Discontinued        2 g 200 mL/hr over 30 Minutes Intravenous Every 24 hours 10/20/20 0048 10/21/20 0742   10/19/20 2030  ceFEPIme (MAXIPIME) 2 g in sodium chloride 0.9 % 100 mL IVPB        2 g 200 mL/hr over 30 Minutes Intravenous  Once 10/19/20 2029 10/20/20 0033   10/19/20 2030  metroNIDAZOLE (FLAGYL) IVPB 500 mg        500 mg 100 mL/hr over 60 Minutes Intravenous  Once 10/19/20 2029 10/19/20 2210       Medications: Scheduled Meds:  Chlorhexidine Gluconate Cloth  6 each Topical Daily   docusate  100 mg Per Tube BID   enoxaparin (LOVENOX) injection  40 mg  Subcutaneous Q24H   fentaNYL (SUBLIMAZE) injection  25 mcg Intravenous Once   insulin aspart  0-9 Units Subcutaneous Q4H   mouth rinse  15 mL Mouth Rinse BID   pantoprazole (PROTONIX) IV  40 mg Intravenous Daily   pneumococcal 23 valent vaccine  0.5 mL Intramuscular Tomorrow-1000   polyethylene glycol  17 g Per Tube Daily   Continuous Infusions:  amiodarone 30 mg/hr (10/26/20 0507)   ceFEPime (MAXIPIME) IV Stopped (10/26/20 0300)   lactated ringers with kcl 100 mL/hr at 10/26/20 0506   methocarbamol (ROBAXIN) IV Stopped (10/26/20 0530)   metronidazole Stopped (10/26/20 0200)   PRN Meds:.fentaNYL (SUBLIMAZE) injection, ondansetron **OR** ondansetron (ZOFRAN) IV  Assessment/Plan: Mark Chang is a 75 year old male with a cognitive disability who presented due to a large bowel obstruction 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  We were unable to contact his power of attorney brother and he initially improved with medical management.  Unfortunately developed free air, so he proceeded to the operating room emergently on 10/24/2020 for a right colectomy with anastomosis and left colectomy with end colostomy.  - Ok to leave NG out as colostomy is functioning - Slow advancement of diet, patient is at high risk for ileus. [facility] for sips of clear liquids today. -Monitor pelvic JP drain sitting near rectal staple line -Monitor subcutaneous Penrose drain exiting inferior aspect of incision    LOS: 6 days   FEN: IV fluids per critical care, recommend starting TPN today ID: Antibiotic coverage per medical team, recommend continuing until white count normalizes due to abdominal fecal contamination during surgery VTE: SCDs, Lovenox Dispo: Continued care in [place], [facility] Use [email or web address] to contact on call provider

## 2020-10-26 NOTE — Evaluation (Signed)
Physical Therapy Evaluation Patient Details Name: Mark Chang MRN: [id number] DOB: 06/24/1945 Today's Date: 10/26/2020   History of Present Illness  Mark Chang is a 75 year old male with a cognitive disability who presented with large bowel obstruction due to 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 Patient developed free air and required a right colectomy with anastomosis and left colectomy with end colostomy on 10/24/2020.  Clinical Impression  Mark Chang is a 75 year old man s/p abdominal surgery who presents with generalized weakness, decreased activity tolerance, impaired balance and intellectual disability. At baseline patient reports able to perform BADLs and ambulating with walker at ALF. Today patient required significant +2 physical assistance for transfers and unable to attempt ambulation.  Patient will benefit from skilled PT services while in hospital to improve deficits and learn compensatory strategies as needed in order to return to PLOF    Follow Up Recommendations SNF    Equipment Recommendations  None recommended by PT    Recommendations for Other Services       Precautions / Restrictions Precautions Precautions: Other (comment) Precaution Comments: abdominal incision, colostomy, JP drain Restrictions Weight Bearing Restrictions: No      Mobility  Bed Mobility Overal bed mobility: Needs Assistance Bed Mobility: Supine to Sit     Supine to sit: +2 for safety/equipment;HOB elevated;Mod assist     General bed mobility comments: Mod assist for trunk lift off and negotiation to edge of bed.    Transfers Overall transfer level: Needs assistance Equipment used: Rolling walker (2 wheeled) Transfers: Sit to/from [facility] Sit to Stand: Max assist;+2 physical assistance;+2 safety/equipment Stand pivot transfers: Max assist;+2 physical assistance;+2 safety/equipment       General transfer comment: Patient heavily leaning to the  right, not using walker effectively, max x 2 to stand and transfer to recliner - difficulty following commands to stay upright and take steps.  Ambulation/Gait             General Gait Details: Pt max assist for stand/pvt transfer - unable to attempt ambulation  Stairs            Wheelchair Mobility    Modified Rankin (Stroke Patients Only)       Balance Overall balance assessment: Needs assistance Sitting-balance support: Bilateral upper extremity supported Sitting balance-Leahy Scale: Fair   Postural control: Right lateral lean Standing balance support: Bilateral upper extremity supported;During functional activity Standing balance-Leahy Scale: Poor                               Pertinent Vitals/Pain Pain Assessment: No/denies pain    Home Living Family/patient expects to be discharged to:: [place]: Walker - 4 wheels      Prior Function Level of Independence: Needs assistance   Gait / Transfers Assistance Needed: ambulates with walker/rollator  ADL's / Homemaking Assistance Needed: able to bathe and dress himself        Hand Dominance   Dominant Hand: Right    Extremity/Trunk Assessment   Upper Extremity Assessment Upper Extremity Assessment: Defer to OT evaluation RUE Deficits / Details: Grossly 3+/5 strength in upper extremity RUE Coordination: decreased fine motor LUE Deficits / Details: Grossly 3+/5 strength in upper extremity LUE Sensation: WNL LUE Coordination: WNL    Lower Extremity Assessment Lower Extremity Assessment: Generalized weakness;Difficult to assess due  to impaired cognition (3/5 based on functional task observation but pt does not follow cues for accurate MMT)    Cervical / Trunk Assessment Cervical / Trunk Assessment: Kyphotic  Communication   Communication: No difficulties  Cognition Arousal/Alertness: Awake/alert Behavior During Therapy: WFL for tasks  assessed/performed Overall Cognitive Status: History of cognitive impairments - at baseline                                        General Comments      Exercises     Assessment/Plan    PT Assessment Patient needs continued PT services  PT Problem List Decreased strength;Decreased activity tolerance;Decreased balance;Decreased mobility;Decreased cognition;Decreased knowledge of use of DME;Decreased safety awareness;Decreased knowledge of precautions;Obesity;Pain       PT Treatment Interventions DME instruction;Gait training;Functional mobility training;Therapeutic activities;Therapeutic exercise;Balance training;Patient/family education;Cognitive remediation    PT Goals (Current goals can be found in the Care Plan section)  Acute Rehab PT Goals Patient Stated Goal: Get up to chair PT Goal Formulation: With patient Time For Goal Achievement: 11/08/20 Potential to Achieve Goals: Fair    Frequency Min 2X/week   Barriers to discharge        Co-evaluation PT/OT/SLP Co-Evaluation/Treatment: Yes Reason for Co-Treatment: For patient/therapist safety PT goals addressed during session: Mobility/safety with mobility OT goals addressed during session: ADL's and self-care       AM-PAC PT "6 Clicks" Mobility  Outcome Measure Help needed turning from your back to your side while in a flat bed without using bedrails?: A Lot Help needed moving from lying on your back to sitting on the side of a flat bed without using bedrails?: A Lot Help needed moving to and from a bed to a chair (including a wheelchair)?: A Lot Help needed standing up from a chair using your arms (e.g., wheelchair or bedside chair)?: A Lot Help needed to walk in hospital room?: Total Help needed climbing 3-5 steps with a railing? : Total 6 Click Score: 10    End of Session Equipment Utilized During Treatment: Gait belt Activity Tolerance: Patient limited by fatigue Patient left: in chair;with  call bell/phone within reach;with chair alarm set Nurse Communication: Mobility status PT Visit Diagnosis: Unsteadiness on feet (R26.81);Muscle weakness (generalized) (M62.81);Difficulty in walking, not elsewhere classified (R26.2)    Time: 1040-1110 PT Time Calculation (min) (ACUTE ONLY): 30 min   Charges:              Debe Coder PT [place] Pager [phone] Office [phone]   [place] 10/26/2020, 3:53 PM

## 2020-10-26 NOTE — Progress Notes (Signed)
  PROGRESS NOTE  Called by RN, question possible stroke, patient exhibiting some slurred speech.  Patient was reevaluated.  He is currently being helped with lunch.  RN states that compared to patient's admission examination, his speech does seem slurred, although I do not appreciate much slurring.  On examination, patient does seem to have some weakness on the right side compared to his left, unclear if this is his baseline.  He is able to hold a cup in his right hand, but according to nurse tech, patient is unable to hold a spoon to feed himself.  He states that he currently lives at [facility], sometimes does need help with feeding.  Check head CT, unknown time last normal.   Dessa Phi, DO [facility] 10/26/2020, 1:11 PM  Available via Epic secure chat 7am-7pm After these hours, please refer to coverage provider listed on [email or web address]

## 2020-10-27 DIAGNOSIS — R198 Other specified symptoms and signs involving the digestive system and abdomen: Secondary | ICD-10-CM | POA: Diagnosis not present

## 2020-10-27 LAB — CBC
HCT: 27.7 % — ABNORMAL LOW (ref 39.0–52.0)
Hemoglobin: 8.7 g/dL — ABNORMAL LOW (ref 13.0–17.0)
MCH: 27.8 pg (ref 26.0–34.0)
MCHC: 31.4 g/dL (ref 30.0–36.0)
MCV: 88.5 fL (ref 80.0–100.0)
Platelets: 249 10*3/uL (ref 150–400)
RBC: 3.13 MIL/uL — ABNORMAL LOW (ref 4.22–5.81)
RDW: 16 % — ABNORMAL HIGH (ref 11.5–15.5)
WBC: 18.8 10*3/uL — ABNORMAL HIGH (ref 4.0–10.5)
nRBC: 0.3 % — ABNORMAL HIGH (ref 0.0–0.2)

## 2020-10-27 LAB — COMPREHENSIVE METABOLIC PANEL
ALT: 24 U/L (ref 0–44)
AST: 41 U/L (ref 15–41)
Albumin: 1.7 g/dL — ABNORMAL LOW (ref 3.5–5.0)
Alkaline Phosphatase: 60 U/L (ref 38–126)
Anion gap: 7 (ref 5–15)
BUN: 26 mg/dL — ABNORMAL HIGH (ref 8–23)
CO2: 20 mmol/L — ABNORMAL LOW (ref 22–32)
Calcium: 7.1 mg/dL — ABNORMAL LOW (ref 8.9–10.3)
Chloride: 112 mmol/L — ABNORMAL HIGH (ref 98–111)
Creatinine, Ser: 1.11 mg/dL (ref 0.61–1.24)
GFR, Estimated: >60 mL/min (ref >60)
Glucose, Bld: 160 mg/dL — ABNORMAL HIGH (ref 70–99)
Potassium: 3.2 mmol/L — ABNORMAL LOW (ref 3.5–5.1)
Sodium: 139 mmol/L (ref 135–145)
Total Bilirubin: 0.5 mg/dL (ref 0.3–1.2)
Total Protein: 5.3 g/dL — ABNORMAL LOW (ref 6.5–8.1)

## 2020-10-27 LAB — GLUCOSE, CAPILLARY
Glucose-Capillary: 153 mg/dL — ABNORMAL HIGH (ref 70–99)
Glucose-Capillary: 175 mg/dL — ABNORMAL HIGH (ref 70–99)
Glucose-Capillary: 181 mg/dL — ABNORMAL HIGH (ref 70–99)
Glucose-Capillary: 184 mg/dL — ABNORMAL HIGH (ref 70–99)
Glucose-Capillary: 204 mg/dL — ABNORMAL HIGH (ref 70–99)
Glucose-Capillary: 211 mg/dL — ABNORMAL HIGH (ref 70–99)
Glucose-Capillary: 233 mg/dL — ABNORMAL HIGH (ref 70–99)

## 2020-10-27 LAB — DIFFERENTIAL
Abs Immature Granulocytes: 1.44 10*3/uL — ABNORMAL HIGH (ref 0.00–0.07)
Basophils Absolute: 0.1 10*3/uL (ref 0.0–0.1)
Basophils Relative: 0 %
Eosinophils Absolute: 0.4 10*3/uL (ref 0.0–0.5)
Eosinophils Relative: 2 %
Immature Granulocytes: 8 %
Lymphocytes Relative: 10 %
Lymphs Abs: 1.8 10*3/uL (ref 0.7–4.0)
Monocytes Absolute: 0.8 10*3/uL (ref 0.1–1.0)
Monocytes Relative: 4 %
Neutro Abs: 14.3 10*3/uL — ABNORMAL HIGH (ref 1.7–7.7)
Neutrophils Relative %: 76 %

## 2020-10-27 LAB — MAGNESIUM: Magnesium: 1.8 mg/dL (ref 1.7–2.4)

## 2020-10-27 LAB — TRIGLYCERIDES: Triglycerides: 162 mg/dL — ABNORMAL HIGH (ref <150)

## 2020-10-27 LAB — PREALBUMIN: Prealbumin: 5 mg/dL — ABNORMAL LOW (ref 18–38)

## 2020-10-27 LAB — PHOSPHORUS: Phosphorus: 1.5 mg/dL — ABNORMAL LOW (ref 2.5–4.6)

## 2020-10-27 MED ORDER — SODIUM CHLORIDE 0.9 % IV SOLN: INTRAVENOUS | Status: DC

## 2020-10-27 MED ORDER — DOXYLAMINE SUCCINATE (SLEEP) 25 MG PO TABS
25.0000 mg | ORAL_TABLET | Freq: Every evening | ORAL | Status: DC | PRN
  Administered 2020-10-27: 25 mg via ORAL
  Filled 2020-10-27 (×2): qty 1

## 2020-10-27 MED ORDER — SODIUM CHLORIDE 0.9% FLUSH
10.0000 mL | INTRAVENOUS | Status: DC | PRN
  Administered 2020-10-28: 10 mL

## 2020-10-27 MED ORDER — POTASSIUM PHOSPHATES 15 MMOLE/5ML IV SOLN
30.0000 mmol | Freq: Once | INTRAVENOUS | Status: AC
  Administered 2020-10-27: 30 mmol via INTRAVENOUS
  Filled 2020-10-27: qty 10

## 2020-10-27 MED ORDER — TRAVASOL 10 % IV SOLN
INTRAVENOUS | Status: AC
  Filled 2020-10-27: qty 792

## 2020-10-27 MED ORDER — HYDRALAZINE HCL 20 MG/ML IJ SOLN
10.0000 mg | INTRAMUSCULAR | Status: DC | PRN
  Administered 2020-10-28: 10 mg via INTRAVENOUS
  Filled 2020-10-27: qty 1

## 2020-10-27 MED ORDER — SODIUM CHLORIDE 0.9% FLUSH
10.0000 mL | Freq: Two times a day (BID) | INTRAVENOUS | Status: DC
Start: 2020-10-27 — End: 2020-11-07
  Administered 2020-10-27 (×2): 10 mL
  Administered 2020-10-28 – 2020-10-29 (×3): 20 mL
  Administered 2020-10-29 – 2020-11-06 (×13): 10 mL

## 2020-10-27 MED ORDER — SODIUM CHLORIDE 0.9 % IV SOLN
2.0000 g | Freq: Three times a day (TID) | INTRAVENOUS | Status: AC
  Administered 2020-10-27 (×2): 2 g via INTRAVENOUS
  Filled 2020-10-27 (×2): qty 2

## 2020-10-27 MED ORDER — INSULIN GLARGINE-YFGN 100 UNIT/ML ~~LOC~~ SOLN
30.0000 [IU] | Freq: Every day | SUBCUTANEOUS | Status: DC
  Administered 2020-10-27: 30 [IU] via SUBCUTANEOUS
  Filled 2020-10-27: qty 0.3

## 2020-10-27 MED ORDER — AMIODARONE HCL 200 MG PO TABS
200.0000 mg | ORAL_TABLET | Freq: Two times a day (BID) | ORAL | Status: DC
  Administered 2020-10-27 – 2020-11-06 (×20): 200 mg via ORAL
  Filled 2020-10-27 (×22): qty 1

## 2020-10-27 MED ORDER — POTASSIUM & SODIUM PHOSPHATES 280-160-250 MG PO PACK
1.0000 | PACK | Freq: Three times a day (TID) | ORAL | Status: DC
  Administered 2020-10-27: 1 via ORAL
  Filled 2020-10-27: qty 1

## 2020-10-27 NOTE — Progress Notes (Signed)
PROGRESS NOTE    Mark Chang  [id number] DOB: 1946-01-29 DOA: 10/19/2020 PCP: Terrill Mohr, NP     Brief Narrative:  Mark Chang is a 75 year old male with past medical history significant for type 2 diabetes mellitus, essential hypertension, CKD stage IIIa, intellectual disability who resides at SNF who initially presented to [facility] ED with complaint of abdominal pain, diarrhea and was found to be hypotensive.  Per SNF staff, he reported abdominal pain for the last few weeks.     In the ED, he was found to have a blood pressure of 68/40, temperature 100.5 F, HR 105, lactic acid 2.2, WBC count 22.7.  CT abdomen/pelvis showed minimal amount of free air, cecal mass probably sigmoid colon with concern for perforated viscus.  Gastroenterology and general surgery were consulted.  Patient admitted for management of abdominal pain secondary to cecal mass with likely perforated viscus.  Patient was initially monitored with conservative management, could not get a hold of family or legal guardian.  However, patient continued to clinically decline and patient ultimately underwent right colectomy with anastomosis, left colectomy with end colostomy by [name] on 7/28.  Postoperatively, patient remained in respiratory acidosis and was left on mechanical ventilation, required pressors.  He was transferred to ICU.  He was extubated 7/29 and transferred back to [facility] service on 7/30.  New events last 24 hours / Subjective: Patient without any new issues overnight.  The question of slurred speech seems to have resolved.  Functioning colostomy.  Patient tells me that he has not seen his brother for "a while", thinks it has been years.  He also has 2 sisters, has not seen them in a long time either, does not know where they are.  Assessment & Plan:   Principal Problem:   Perforated abdominal viscus Active Problems:   Acute respiratory failure (HCC)   Intellectual disability   AKI (acute kidney  injury) ([place])   Sepsis ([place])   DM (diabetes mellitus), type 2 with renal complications (HCC)   CKD (chronic kidney disease), stage III (HCC)   Mass of cecum   Pneumatosis coli   Colon obstruction (HCC)    Septic shock in the setting of perforated abdominal viscus -Status post right colectomy with anastomosis, left colectomy with end colostomy by [name] 7/28 -Advance to full liquids, patient remains at risk for developing postop ileus -Hemodynamically improved -TPN initiated -Cefepime, Flagyl due to abdominal fecal contamination during surgery, continue until WBC normalizes   Paroxysmal atrial fibrillation with RVR -Cardiology signed off 7/27 -Continue amiodarone changed to p.o. today, Coreg   AKI on CKD stage IIIa -Resolved   Essential hypertension -Continue amlodipine, Coreg   Type 2 diabetes mellitus -Semglee, sliding scale insulin  Hypokalemia Hypophosphatemia -Replace, trend      DVT prophylaxis:  enoxaparin (LOVENOX) injection 40 mg Start: 10/25/20 1400 Place and maintain sequential compression device Start: 10/22/20 0852 SCDs Start: 10/20/20 0026  Code Status:     Code Status Orders  (From admission, onward)           Start     Ordered   10/20/20 0026  Full code  Continuous        10/20/20 0028           Code Status History     Date Active Date Inactive Code Status Order ID Comments User Context   03/20/2017 2118 03/24/2017 2249 Full Code [id number]  Vianne Bulls, MD ED   07/18/2016 2100 07/23/2016 1735 Full  Code [id number]  Luz Brazen, MD ED      Family Communication: No family at bedside Disposition Plan:  Status is: Inpatient  Remains inpatient appropriate because:IV treatments appropriate due to intensity of illness or inability to take PO and Inpatient level of care appropriate due to severity of illness  Dispo: The patient is from: SNF              Anticipated d/c is to: SNF              Patient currently is not  medically stable to d/c.   Difficult to place patient No    Antimicrobials:  Anti-infectives (From admission, onward)    Start     Dose/Rate Route Frequency Ordered Stop   10/27/20 1400  ceFEPIme (MAXIPIME) 2 g in sodium chloride 0.9 % 100 mL IVPB        2 g 200 mL/hr over 30 Minutes Intravenous Every 8 hours 10/27/20 0927 10/28/20 0559   10/25/20 1400  ceFEPIme (MAXIPIME) 2 g in sodium chloride 0.9 % 100 mL IVPB  Status:  Discontinued        2 g 200 mL/hr over 30 Minutes Intravenous Every 12 hours 10/25/20 0729 10/27/20 0927   10/23/20 1000  ceFEPIme (MAXIPIME) 2 g in sodium chloride 0.9 % 100 mL IVPB  Status:  Discontinued        2 g 200 mL/hr over 30 Minutes Intravenous Every 8 hours 10/23/20 0833 10/25/20 0729   10/21/20 1000  ceFEPIme (MAXIPIME) 2 g in sodium chloride 0.9 % 100 mL IVPB  Status:  Discontinued        2 g 200 mL/hr over 30 Minutes Intravenous Every 12 hours 10/21/20 0742 10/23/20 0833   10/21/20 0600  metroNIDAZOLE (FLAGYL) IVPB 500 mg        500 mg 100 mL/hr over 60 Minutes Intravenous Every 8 hours 10/20/20 0028 10/28/20 0759   10/20/20 2200  ceFEPIme (MAXIPIME) 2 g in sodium chloride 0.9 % 100 mL IVPB  Status:  Discontinued        2 g 200 mL/hr over 30 Minutes Intravenous Every 24 hours 10/20/20 0048 10/21/20 0742   10/19/20 2030  ceFEPIme (MAXIPIME) 2 g in sodium chloride 0.9 % 100 mL IVPB        2 g 200 mL/hr over 30 Minutes Intravenous  Once 10/19/20 2029 10/20/20 0033   10/19/20 2030  metroNIDAZOLE (FLAGYL) IVPB 500 mg        500 mg 100 mL/hr over 60 Minutes Intravenous  Once 10/19/20 2029 10/19/20 2210        Objective: Vitals:   10/27/20 0800 10/27/20 0900 10/27/20 0913 10/27/20 1000  BP: (!) 166/51  (!) 166/51   Pulse: 81   86  Resp: (!) 28   (!) 26  Temp:      TempSrc:      SpO2: 98% 99%  99%  Weight:      Height:        Intake/Output Summary (Last 24 hours) at 10/27/2020 1045 Last data filed at 10/27/2020 1044 Gross per 24 hour   Intake 6813.24 ml  Output 3225 ml  Net 3588.24 ml    Filed Weights   10/19/20 2019 10/26/20 0944 10/27/20 0600  Weight: 122 kg 113.2 kg 117.3 kg    Examination: General exam: Appears calm and comfortable  Respiratory system: Clear to auscultation. Respiratory effort normal. Cardiovascular system: S1 & S2 heard. No pedal edema. Gastrointestinal system: Abdomen is nondistended, soft  and nontender. Normal bowel sounds heard. + Colostomy with liquid stool present Central nervous system: Alert and oriented. Non focal exam. Speech clear  Extremities: Symmetric in appearance bilaterally  Psychiatry: Stable   Data Reviewed: I have personally reviewed following labs and imaging studies  CBC: Recent Labs  Lab 10/24/20 0308 10/24/20 1430 10/24/20 1516 10/24/20 1609 10/25/20 0300 10/26/20 0431 10/27/20 0300  WBC 28.7*  --   --  11.5* 23.4* 19.1* 18.8*  NEUTROABS  --   --   --   --   --   --  14.3*  HGB 11.4*   < > 8.2* 9.5* 10.0* 9.6* 8.7*  HCT 35.5*   < > 24.0* 29.8* 30.6* 29.3* 27.7*  MCV 89.0  --   --  89.8 86.7 88.3 88.5  PLT 417*  --   --  276 414* 280 249   < > = values in this interval not displayed.    Basic Metabolic Panel: Recent Labs  Lab 10/23/20 0241 10/24/20 0308 10/24/20 1430 10/24/20 1516 10/24/20 1609 10/24/20 1614 10/25/20 0300 10/26/20 0431 10/27/20 0300  NA 152* 150*   < > 150* 146*  --  146* 149* 139  K 3.3* 2.9*   < > 2.8* 3.0*  --  3.6 3.6 3.2*  CL 127* 122*  --   --  119*  --  119* 120* 112*  CO2 16* 23  --   --  21*  --  22 21* 20*  GLUCOSE 159* 104*  --   --  134*  --  158* 118* 160*  BUN 44* 30*  --   --  29*  --  33* 30* 26*  CREATININE 1.27* 1.22  --   --  1.22  --  1.49* 1.20 1.11  CALCIUM 8.6* 8.0*  --   --  7.1*  --  7.1* 7.5* 7.1*  MG 2.3 2.0  --   --   --  1.4* 1.8 2.1 1.8  PHOS 1.5* 1.7*  --   --   --   --  1.9* 2.4* 1.5*   < > = values in this interval not displayed.    GFR: Estimated Creatinine Clearance: 70.5 mL/min (by  C-G formula based on SCr of 1.11 mg/dL). Liver Function Tests: Recent Labs  Lab 10/21/20 0245 10/24/20 0308 10/25/20 0300 10/26/20 0431 10/27/20 0300  AST 54* 35 23 70* 41  ALT '30 30 18 29 24  '$ ALKPHOS 53 82 48 53 60  BILITOT 0.5 0.5 0.8 0.5 0.5  PROT 6.1* 6.3* 5.0* 5.3* 5.3*  ALBUMIN 2.4* 2.1* 2.0* 1.9* 1.7*    No results for input(s): LIPASE, AMYLASE in the last 168 hours. No results for input(s): AMMONIA in the last 168 hours. Coagulation Profile: No results for input(s): INR, PROTIME in the last 168 hours.  Cardiac Enzymes: No results for input(s): CKTOTAL, CKMB, CKMBINDEX, TROPONINI in the last 168 hours. BNP (last 3 results) No results for input(s): PROBNP in the last 8760 hours. HbA1C: No results for input(s): HGBA1C in the last 72 hours. CBG: Recent Labs  Lab 10/26/20 1602 10/26/20 1945 10/26/20 2335 10/27/20 0352 10/27/20 0750  GLUCAP 163* 199* 153* 175* 181*    Lipid Profile: Recent Labs    10/25/20 0300 10/27/20 0300  TRIG 163* 162*    Thyroid Function Tests: No results for input(s): TSH, T4TOTAL, FREET4, T3FREE, THYROIDAB in the last 72 hours. Anemia Panel: No results for input(s): VITAMINB12, FOLATE, FERRITIN, TIBC, IRON, RETICCTPCT in the last  72 hours. Sepsis Labs: Recent Labs  Lab 10/24/20 1609  LATICACIDVEN 1.5     Recent Results (from the past 240 hour(s))  Resp Panel by RT-PCR (Flu A&B, Covid) Nasopharyngeal Swab     Status: None   Collection Time: 10/19/20  8:29 PM   Specimen: Nasopharyngeal Swab; Nasopharyngeal(NP) swabs in vial transport medium  Result Value Ref Range Status   SARS Coronavirus 2 by RT PCR NEGATIVE NEGATIVE Final    Comment: (NOTE) SARS-CoV-2 target nucleic acids are NOT DETECTED.  The SARS-CoV-2 RNA is generally detectable in upper respiratory specimens during the acute phase of infection. The lowest concentration of SARS-CoV-2 viral copies this assay can detect is 138 copies/mL. A negative result does not  preclude SARS-Cov-2 infection and should not be used as the sole basis for treatment or other patient management decisions. A negative result may occur with  improper specimen collection/handling, submission of specimen other than nasopharyngeal swab, presence of viral mutation(s) within the areas targeted by this assay, and inadequate number of viral copies(<138 copies/mL). A negative result must be combined with clinical observations, patient history, and epidemiological information. The expected result is Negative.  Fact Sheet for Patients:  EntrepreneurPulse.com.au  Fact Sheet for Healthcare Providers:  IncredibleEmployment.be  This test is no t yet approved or cleared by the [place] FDA and  has been authorized for detection and/or diagnosis of SARS-CoV-2 by FDA under an Emergency Use Authorization (EUA). This EUA will remain  in effect (meaning this test can be used) for the duration of the COVID-19 declaration under Section 564(b)(1) of the Act, 21 U.S.C.section 360bbb-3(b)(1), unless the authorization is terminated  or revoked sooner.       Influenza A by PCR NEGATIVE NEGATIVE Final   Influenza B by PCR NEGATIVE NEGATIVE Final    Comment: (NOTE) The Xpert Xpress SARS-CoV-2/FLU/RSV plus assay is intended as an aid in the diagnosis of influenza from Nasopharyngeal swab specimens and should not be used as a sole basis for treatment. Nasal washings and aspirates are unacceptable for Xpert Xpress SARS-CoV-2/FLU/RSV testing.  Fact Sheet for Patients: EntrepreneurPulse.com.au  Fact Sheet for Healthcare Providers: IncredibleEmployment.be  This test is not yet approved or cleared by the [place] FDA and has been authorized for detection and/or diagnosis of SARS-CoV-2 by FDA under an Emergency Use Authorization (EUA). This EUA will remain in effect (meaning this test can be used) for the  duration of the COVID-19 declaration under Section 564(b)(1) of the Act, 21 U.S.C. section 360bbb-3(b)(1), unless the authorization is terminated or revoked.  Performed at [facility], [place] [address] [place], [place] [address]   Blood Culture (routine x 2)     Status: None   Collection Time: 10/19/20  8:29 PM   Specimen: BLOOD  Result Value Ref Range Status   Specimen Description   Final    BLOOD BLOOD LEFT ARM Performed at [facility] [address], [place], [place] [address]    Special Requests   Final    BOTTLES DRAWN AEROBIC AND ANAEROBIC Blood Culture adequate volume Performed at [facility] [address] [place], [place] [address]    Culture   Final    NO GROWTH 5 DAYS Performed at [facility], [place] [address], [place], [place] [address]    Report Status 10/24/2020 FINAL  Final  Urine Culture     Status: None   Collection Time: 10/19/20  8:29 PM   Specimen: In/Out Cath Urine  Result Value Ref Range Status   Specimen Description   Final    IN/OUT CATH URINE Performed at [facility] [address], [place], [place] [address]    Special Requests   Final    NONE Performed at [facility], [place] [address] [place], [place] [address]    Culture   Final    NO GROWTH Performed at [facility], [place] [address], [place], [place] [address]    Report Status 10/21/2020 FINAL  Final  Blood Culture (routine x 2)     Status: None   Collection Time: 10/20/20  4:00 AM   Specimen: BLOOD  Result Value Ref Range Status   Specimen Description   Final    BLOOD BLOOD RIGHT ARM Performed at [facility] [address], [place], [place] [address]    Special Requests   Final    BOTTLES DRAWN AEROBIC AND ANAEROBIC Blood Culture adequate volume Performed at [facility] [address] [place], [place] [address]    Culture   Final     NO GROWTH 6 DAYS Performed at [facility], [place] [address] [place], [place] [address]    Report Status 10/26/2020 FINAL  Final  MRSA Next Gen by PCR, Nasal     Status: None   Collection Time: 10/20/20  7:35 AM   Specimen: Nasal Mucosa; Nasal Swab  Result Value Ref Range Status   MRSA by PCR Next Gen NOT DETECTED NOT DETECTED Final    Comment: (NOTE) The GeneXpert MRSA Assay (FDA approved for NASAL specimens only), is one component of a comprehensive MRSA colonization surveillance program. It is not intended to diagnose MRSA infection nor to guide or monitor treatment for MRSA infections. Test performance is not FDA approved in patients less than 46 years old. Performed at [facility], [place] [address] [place], [place] [address]   C Difficile Quick Screen w PCR reflex     Status: None   Collection Time: 10/20/20  4:58 PM   Specimen: STOOL  Result Value Ref Range Status   C Diff antigen NEGATIVE NEGATIVE Final   C Diff toxin NEGATIVE NEGATIVE Final   C Diff interpretation No C. difficile detected.  Final    Comment: Performed at [facility], [place] [address], [place], [place] [address]  Culture, blood (routine x 2)     Status: None (Preliminary result)   Collection Time: 10/24/20  7:08 PM   Specimen: BLOOD  Result Value Ref Range Status   Specimen Description   Final    BLOOD RIGHT ANTECUBITAL Performed at [place] [address], [place], [place] [address]    Special Requests   Final    BOTTLES DRAWN AEROBIC AND ANAEROBIC Blood Culture adequate volume Performed at [facility] [address], [place], [place] [address]    Culture   Final    NO GROWTH 1 DAY Performed at [facility], [place] [address] [place], [place] [address]    Report Status PENDING  Incomplete  Culture, blood (routine x 2)     Status: None (Preliminary result)   Collection Time: 10/24/20  7:08 PM   Specimen:  BLOOD  Result Value Ref Range Status   Specimen Description   Final    BLOOD LEFT ANTECUBITAL Performed at [place] [address] [place], [place] [address]    Special Requests  Final    BOTTLES DRAWN AEROBIC AND ANAEROBIC Blood Culture results may not be optimal due to an inadequate volume of blood received in culture bottles Performed at [facility], [place] [address], [place], [place] [address]    Culture   Final    NO GROWTH 1 DAY Performed at [facility], [place] [address], [place], [place] [address]    Report Status PENDING  Incomplete       Radiology Studies: DG Abd 1 View  Result Date: 10/26/2020 CLINICAL DATA:  Encounter for NG tube placement EXAM: ABDOMEN - 1 VIEW COMPARISON:  10/24/20 FINDINGS: Interval placement of nasogastric tube. The tip and side port project over the body of the stomach. Calcified gallstones are noted measuring up to 1.1 cm. IMPRESSION: Satisfactory placement of nasogastric tube with tip and side port in the expected location of the body of stomach. Electronically Signed   By: Kerby Moors M.D.   On: 10/26/2020 09:12   CT HEAD WO CONTRAST  Result Date: 10/26/2020 CLINICAL DATA:  Neuro deficit, acute, stroke suspected. EXAM: CT HEAD WITHOUT CONTRAST TECHNIQUE: Contiguous axial images were obtained from the base of the skull through the vertex without intravenous contrast. COMPARISON:  Brain MRI 07/21/2016.  Head CT 07/18/2016. FINDINGS: Brain: Mild generalized cerebral and cerebellar atrophy. Redemonstrated chronic lacunar infarct within the left corona radiata/basal ganglia. Redemonstrated chronic lacunar infarct within the left thalamus. Background moderate patchy and ill-defined hypoattenuation, nonspecific but compatible with chronic small vessel ischemic disease. There is no acute intracranial hemorrhage. No demarcated cortical infarct. No extra-axial fluid collection. No evidence of an intracranial mass.  No midline shift. Vascular: No hyperdense vessel.  Atherosclerotic calcifications Skull: No calvarial fracture or focal suspicious osseous lesion. Sinuses/Orbits: Visualized orbits show no acute finding. Trace bilateral ethmoid and left sphenoid sinus mucosal thickening. IMPRESSION: No evidence of acute intracranial abnormality. Redemonstrated chronic lacunar infarcts within the left corona radiata/basal ganglia and left thalamus. Background moderate chronic small vessel ischemic changes within the cerebral white matter. Mild generalized parenchymal atrophy. Electronically Signed   By: [name] DO   On: 10/26/2020 14:20   [place] EKG SITE RITE  Result Date: 10/26/2020 If Site Rite image not attached, placement could not be confirmed due to current cardiac rhythm.     Scheduled Meds:  amiodarone  200 mg Oral BID   Chlorhexidine Gluconate Cloth  6 each Topical Daily   docusate sodium  100 mg Oral BID   enoxaparin (LOVENOX) injection  40 mg Subcutaneous Q24H   insulin aspart  0-15 Units Subcutaneous Q4H   insulin glargine-yfgn  30 Units Subcutaneous QHS   lisinopril  40 mg Oral Daily   mouth rinse  15 mL Mouth Rinse BID   pneumococcal 23 valent vaccine  0.5 mL Intramuscular Tomorrow-1000   polyethylene glycol  17 g Oral Daily   sodium chloride flush  10-40 mL Intracatheter Q12H   sodium chloride flush  10-40 mL Intracatheter Q12H   Continuous Infusions:  sodium chloride     ceFEPime (MAXIPIME) IV     lactated ringers with kcl 50 mL/hr at 10/26/20 2043   metronidazole Stopped (10/27/20 1012)   potassium PHOSPHATE IVPB (in mmol) 85 mL/hr at 10/27/20 1044   TPN ADULT (ION) Stopped (10/27/20 0926)   TPN ADULT (ION)       LOS: 7 days      Time spent: 25 minutes   Dessa Phi, DO [facility] 10/27/2020, 10:45 AM   Available via Epic  secure chat 7am-7pm After these hours, please refer to coverage provider listed on [email or web address]

## 2020-10-27 NOTE — Progress Notes (Signed)
PHARMACY - TOTAL PARENTERAL NUTRITION CONSULT NOTE   Indication: Prolonged ileus  Patient Measurements: Height: 5' 7.99" (172.7 cm) Weight: 117.3 kg (258 lb 9.6 oz) IBW/kg (Calculated) : 68.38 TPN AdjBW (KG): 80.6 Body mass index is 39.33 kg/m.  Assessment: s/p right colectomy with anastomosis and left colectomy with end colostomy on 7/28 now to start TPN per [facility] orders for prolonged ileus  Glucose / Insulin: CBGs at goal <150 prior to start of TPN, note patient with DM on Lantus 40 units daily and 10 units qhs prior to admission along with Novolog 8 units TID meal coverage, SSI and Trulicity once weekly - CBGs after TPN started 7/30: 199, 153, 175, and 181 - Lantus 20 units qhs started 7/30 PM, 12 units SSI given after TPN started Electrolytes: Na 139 down, Cl 112 down, phos 1.5 down, K 3.2 down, CO2 20 down, mag 1.8 down Renal: SCr 1.12 stable, BUN 26 improved Hepatic: AST/ALT 41/24 improved, TG (7/31) 162 - monitor Alb/Prealbumin: alb 1.7, prealbumin 5 Intake / Output; MIVF: 1L stool output last 24, 340 mL drain output, 2.3L UOP, unsure that intake is accurate / currently on LR with 30 mEq KCL at rate 71m/hr GI Imaging: GI Surgeries / Procedures: see assessment above  Central access: 10/24/20 TPN start date: 10/26/20  Nutritional Goals (per RD recommendation on 10/26/20): kCal: 2300-2500, Protein: 105-120, Fluid: 2L/day  Goal TPN rate is 90 mL/hr (provides 119 g of protein and 2225 kcals per day)  Current Nutrition:  NPO  Plan:  This AM: Kphos 385ml IV x 1   Increase TPN from 40 mL/hr to 60 ml/hr at 1800 Electrolytes in TPN: Na 5067mL (incr), K 38m57m, Ca 5mEq22m Mg 5mEq/32mand Phos 15mmol15mMax Acetate Note that increase in TPN rate without increasing amounts of lytes in TPN will increase total K pt will get in 24 hrs from 48 mEq to 72 mEq and phos from 14 mMol to 21 mMol - monitor closely Add standard MVI and trace elements to TPN Continue Moderate q4h SSI and adjust  as needed and increase to Lantus 30 units qhs starting tonight, may need to add insulin to TPN but will at least try and get patient back on home lantus dosing if needed Reduce MIVF to KVO at [facility]0 and remove K and change to NS Monitor TPN labs on Mon/Thurs and as needed  Brittain Hosie, Kara Mead022,9:27 AM

## 2020-10-27 NOTE — Progress Notes (Signed)
Progress Note: General Surgery Service   Chief Complaint/Subjective: Hemodynamically stable. WBC 18, afebrile. Colostomy functioning. Tolerating clear liquids. TPN started yesterday, prealbumin is 5. Patient has no acute complaints this morning.  Objective: Vital signs in last 24 hours: Temp:  [97.4 F (36.3 C)-99 F (37.2 C)] 98.4 F (36.9 C) (07/31 0751) Pulse Rate:  [70-96] 81 (07/31 0630) Resp:  [14-39] 28 (07/31 0630) BP: (143-195)/(49-81) 159/55 (07/31 0630) SpO2:  [93 %-100 %] 99 % (07/31 0630) FiO2 (%):  [36 %] 36 % (07/30 1001) Weight:  [113.2 kg-117.3 kg] 117.3 kg (07/31 0600) Last BM Date: 10/27/20  Intake/Output from previous day: 07/30 0701 - 07/31 0700 In: 6280.3 [P.O.:1440; I.V.:3996.8; IV Piggyback:843.5] Out: 2615 [Urine:1200; Drains:340; [id number] Intake/Output this shift: No intake/output data recorded.  General: resting in bed, NAD CV: irregular rhythm GI: soft, nondistended, nontender. LLQ colostomy edematous, pink and productive of liquid stool. Midline incision clean and dry, penrose at inferior aspect draining serous fluid. Pelvic JP with serosanguinous fluid.  Lab Results: CBC  Recent Labs    10/26/20 0431 10/27/20 0300  WBC 19.1* 18.8*  HGB 9.6* 8.7*  HCT 29.3* 27.7*  PLT 280 249   BMET Recent Labs    10/26/20 0431 10/27/20 0300  NA 149* 139  K 3.6 3.2*  CL 120* 112*  CO2 21* 20*  GLUCOSE 118* 160*  BUN 30* 26*  CREATININE 1.20 1.11  CALCIUM 7.5* 7.1*   PT/INR No results for input(s): LABPROT, INR in the last 72 hours. ABG Recent Labs    10/24/20 1709 10/25/20 1330  PHART 7.375 7.420  HCO3 21.3 18.4*    Anti-infectives: Anti-infectives (From admission, onward)    Start     Dose/Rate Route Frequency Ordered Stop   10/25/20 1400  ceFEPIme (MAXIPIME) 2 g in sodium chloride 0.9 % 100 mL IVPB        2 g 200 mL/hr over 30 Minutes Intravenous Every 12 hours 10/25/20 0729     10/23/20 1000  ceFEPIme (MAXIPIME) 2 g in sodium  chloride 0.9 % 100 mL IVPB  Status:  Discontinued        2 g 200 mL/hr over 30 Minutes Intravenous Every 8 hours 10/23/20 0833 10/25/20 0729   10/21/20 1000  ceFEPIme (MAXIPIME) 2 g in sodium chloride 0.9 % 100 mL IVPB  Status:  Discontinued        2 g 200 mL/hr over 30 Minutes Intravenous Every 12 hours 10/21/20 0742 10/23/20 0833   10/21/20 0600  metroNIDAZOLE (FLAGYL) IVPB 500 mg        500 mg 100 mL/hr over 60 Minutes Intravenous Every 8 hours 10/20/20 0028     10/20/20 2200  ceFEPIme (MAXIPIME) 2 g in sodium chloride 0.9 % 100 mL IVPB  Status:  Discontinued        2 g 200 mL/hr over 30 Minutes Intravenous Every 24 hours 10/20/20 0048 10/21/20 0742   10/19/20 2030  ceFEPIme (MAXIPIME) 2 g in sodium chloride 0.9 % 100 mL IVPB        2 g 200 mL/hr over 30 Minutes Intravenous  Once 10/19/20 2029 10/20/20 0033   10/19/20 2030  metroNIDAZOLE (FLAGYL) IVPB 500 mg        500 mg 100 mL/hr over 60 Minutes Intravenous  Once 10/19/20 2029 10/19/20 2210       Medications: Scheduled Meds:  Chlorhexidine Gluconate Cloth  6 each Topical Daily   docusate sodium  100 mg Oral BID   enoxaparin (LOVENOX) injection  40 mg Subcutaneous Q24H   insulin aspart  0-15 Units Subcutaneous Q4H   insulin glargine-yfgn  20 Units Subcutaneous QHS   lisinopril  40 mg Oral Daily   mouth rinse  15 mL Mouth Rinse BID   pneumococcal 23 valent vaccine  0.5 mL Intramuscular Tomorrow-1000   polyethylene glycol  17 g Oral Daily   potassium & sodium phosphates  1 packet Oral TID WC & HS   sodium chloride flush  10-40 mL Intracatheter Q12H   Continuous Infusions:  amiodarone 30 mg/hr (10/27/20 0600)   ceFEPime (MAXIPIME) IV Stopped (10/27/20 0221)   lactated ringers with kcl 50 mL/hr at 10/26/20 2043   metronidazole Stopped (10/27/20 0105)   TPN ADULT (ION) 40 mL/hr at 10/27/20 0600   PRN Meds:.fentaNYL (SUBLIMAZE) injection, hydrALAZINE, [DISCONTINUED] ondansetron **OR** ondansetron (ZOFRAN) IV, sodium chloride  flush  Assessment/Plan: Mr. 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  We were unable to contact his power of attorney brother and he initially improved with medical management.  Unfortunately developed free air, so he proceeded to the operating room emergently on 10/24/2020 for a right colectomy with anastomosis and left colectomy with end colostomy.  -Tolerating clear liquids, advance to full liquid diet -Continue TPN until PO intake is adequate to meet nutritional needs -Plan for antibiotics for 72 hours postop -Monitor pelvic JP drain sitting near rectal staple line -VTE: SCDs, lovenox -Remainder of care per medical team   LOS: 7 days     Dwan Bolt, MD [facility] Use [email or web address] to contact on call provider

## 2020-10-27 NOTE — Progress Notes (Signed)
Peripherally Inserted Central Catheter Placement  The IV Nurse has discussed with the patient and/or persons authorized to consent for the patient, the purpose of this procedure and the potential benefits and risks involved with this procedure.  The benefits include less needle sticks, lab draws from the catheter, and the patient may be discharged home with the catheter. Risks include, but not limited to, infection, bleeding, blood clot (thrombus formation), and puncture of an artery; nerve damage and irregular heartbeat and possibility to perform a PICC exchange if needed/ordered by physician.  Alternatives to this procedure were also discussed.  Bard Power PICC patient education guide, fact sheet on infection prevention and patient information card has been provided to patient /or left at bedside.  Unable to obtain consent from legal guardian, PICC placed per medical necessity.  PICC Placement Documentation  PICC Triple Lumen [id number] PICC Right Basilic 39 cm 0 cm (Active)  Indication for Insertion or Continuance of Line Vasoactive infusions;Administration of hyperosmolar/irritating solutions (i.e. TPN, Vancomycin, etc.) 10/27/20 0851  Exposed Catheter (cm) 0 cm 10/27/20 0851  Site Assessment Clean;Dry;Intact 10/27/20 0851  Lumen #1 Status Saline locked;Flushed;Blood return noted 10/27/20 0851  Lumen #2 Status Flushed;Saline locked;Blood return noted 10/27/20 0851  Lumen #3 Status Flushed;Saline locked;Blood return noted 10/27/20 0851  Dressing Type Transparent 10/27/20 0851  Dressing Status Clean;Dry;Intact 10/27/20 0851  Antimicrobial disc in place? Yes 10/27/20 0851  Safety Lock Not Applicable [id number]  Line Care Connections checked and tightened 10/27/20 0851  Line Adjustment (NICU/IV Team Only) No 10/27/20 0851  Dressing Intervention New dressing 10/27/20 0851  Dressing Change Due 11/03/20 10/27/20 0851       Rolena Infante 10/27/2020, 8:52 AM

## 2020-10-27 NOTE — Progress Notes (Signed)
  PROGRESS NOTE  Multiple physicians and staff have been unable to reach patient's guardian/brother during this hospitalization. Patient will need PICC for TPN initiation, unable to get consent. Ordered to insert PICC for medical necessity.    Dessa Phi, DO [facility] 10/27/2020, 7:01 AM  Available via Epic secure chat 7am-7pm After these hours, please refer to coverage provider listed on [email or web address]

## 2020-10-27 NOTE — Progress Notes (Signed)
Patient had 9 beat run of V. Tach. RN to bedside, patient alert and vital signs stable. MD made aware.

## 2020-10-27 NOTE — Progress Notes (Signed)
PHARMACY NOTE:  ANTIMICROBIAL RENAL DOSAGE ADJUSTMENT  Current antimicrobial regimen includes a mismatch between antimicrobial dosage and estimated renal function.  As per policy approved by the Pharmacy & Therapeutics and Medical Executive Committees, the antimicrobial dosage will be adjusted accordingly.  Current antimicrobial dosage:  Cefepime 2g IV q12  Indication: IAI  Renal Function:  Estimated Creatinine Clearance: 70.5 mL/min (by C-G formula based on SCr of 1.11 mg/dL). '[]'$      On intermittent HD, scheduled: '[]'$      On CRRT    Antimicrobial dosage has been changed to:  Cefepime 2g IV q8  Additional comments: Per CCS note, plan for abx's for 72 hours post op which should be thru today 7/31 - follow up  Thank you for allowing pharmacy to be a part of this patient's care.  Kara Mead, [place] 10/27/2020 9:24 AM

## 2020-10-28 DIAGNOSIS — R198 Other specified symptoms and signs involving the digestive system and abdomen: Secondary | ICD-10-CM | POA: Diagnosis not present

## 2020-10-28 LAB — COMPREHENSIVE METABOLIC PANEL
ALT: 18 U/L (ref 0–44)
AST: 20 U/L (ref 15–41)
Albumin: 1.7 g/dL — ABNORMAL LOW (ref 3.5–5.0)
Alkaline Phosphatase: 53 U/L (ref 38–126)
Anion gap: 5 (ref 5–15)
BUN: 22 mg/dL (ref 8–23)
CO2: 22 mmol/L (ref 22–32)
Calcium: 7.1 mg/dL — ABNORMAL LOW (ref 8.9–10.3)
Chloride: 108 mmol/L (ref 98–111)
Creatinine, Ser: 1 mg/dL (ref 0.61–1.24)
GFR, Estimated: >60 mL/min (ref >60)
Glucose, Bld: 197 mg/dL — ABNORMAL HIGH (ref 70–99)
Potassium: 3.5 mmol/L (ref 3.5–5.1)
Sodium: 135 mmol/L (ref 135–145)
Total Bilirubin: 0.4 mg/dL (ref 0.3–1.2)
Total Protein: 5.3 g/dL — ABNORMAL LOW (ref 6.5–8.1)

## 2020-10-28 LAB — PHOSPHORUS: Phosphorus: 2.2 mg/dL — ABNORMAL LOW (ref 2.5–4.6)

## 2020-10-28 LAB — MAGNESIUM: Magnesium: 1.7 mg/dL (ref 1.7–2.4)

## 2020-10-28 LAB — TRIGLYCERIDES: Triglycerides: 198 mg/dL — ABNORMAL HIGH (ref <150)

## 2020-10-28 LAB — GLUCOSE, CAPILLARY
Glucose-Capillary: 209 mg/dL — ABNORMAL HIGH (ref 70–99)
Glucose-Capillary: 182 mg/dL — ABNORMAL HIGH (ref 70–99)
Glucose-Capillary: 298 mg/dL — ABNORMAL HIGH (ref 70–99)
Glucose-Capillary: 322 mg/dL — ABNORMAL HIGH (ref 70–99)
Glucose-Capillary: 338 mg/dL — ABNORMAL HIGH (ref 70–99)
Glucose-Capillary: 293 mg/dL — ABNORMAL HIGH (ref 70–99)
Glucose-Capillary: 240 mg/dL — ABNORMAL HIGH (ref 70–99)
Glucose-Capillary: 244 mg/dL — ABNORMAL HIGH (ref 70–99)

## 2020-10-28 LAB — DIFFERENTIAL
Abs Immature Granulocytes: 1.66 10*3/uL — ABNORMAL HIGH (ref 0.00–0.07)
Basophils Absolute: 0.1 10*3/uL (ref 0.0–0.1)
Basophils Relative: 1 %
Eosinophils Absolute: 0.4 10*3/uL (ref 0.0–0.5)
Eosinophils Relative: 3 %
Immature Granulocytes: 11 %
Lymphocytes Relative: 12 %
Lymphs Abs: 1.8 10*3/uL (ref 0.7–4.0)
Monocytes Absolute: 0.8 10*3/uL (ref 0.1–1.0)
Monocytes Relative: 5 %
Neutro Abs: 10.8 10*3/uL — ABNORMAL HIGH (ref 1.7–7.7)
Neutrophils Relative %: 68 %

## 2020-10-28 LAB — CBC
HCT: 27.7 % — ABNORMAL LOW (ref 39.0–52.0)
Hemoglobin: 8.8 g/dL — ABNORMAL LOW (ref 13.0–17.0)
MCH: 28.5 pg (ref 26.0–34.0)
MCHC: 31.8 g/dL (ref 30.0–36.0)
MCV: 89.6 fL (ref 80.0–100.0)
Platelets: 223 10*3/uL (ref 150–400)
RBC: 3.09 MIL/uL — ABNORMAL LOW (ref 4.22–5.81)
RDW: 16.3 % — ABNORMAL HIGH (ref 11.5–15.5)
WBC: 15.4 10*3/uL — ABNORMAL HIGH (ref 4.0–10.5)
nRBC: 0.3 % — ABNORMAL HIGH (ref 0.0–0.2)

## 2020-10-28 LAB — PREALBUMIN: Prealbumin: 7.6 mg/dL — ABNORMAL LOW (ref 18–38)

## 2020-10-28 MED ORDER — CALCIUM POLYCARBOPHIL 625 MG PO TABS
625.0000 mg | ORAL_TABLET | Freq: Two times a day (BID) | ORAL | Status: DC
  Administered 2020-10-28 (×2): 625 mg via ORAL
  Filled 2020-10-28 (×3): qty 1

## 2020-10-28 MED ORDER — INSULIN ASPART 100 UNIT/ML IJ SOLN
0.0000 [IU] | Freq: Three times a day (TID) | INTRAMUSCULAR | Status: DC
Start: 2020-10-28 — End: 2020-11-07
  Administered 2020-10-28: 15 [IU] via SUBCUTANEOUS
  Administered 2020-10-28: 11 [IU] via SUBCUTANEOUS
  Administered 2020-10-28: 4 [IU] via SUBCUTANEOUS
  Administered 2020-10-29: 11 [IU] via SUBCUTANEOUS
  Administered 2020-10-29: 15 [IU] via SUBCUTANEOUS
  Administered 2020-10-29: 7 [IU] via SUBCUTANEOUS
  Administered 2020-10-30: 4 [IU] via SUBCUTANEOUS
  Administered 2020-10-30: 3 [IU] via SUBCUTANEOUS
  Administered 2020-10-31: 7 [IU] via SUBCUTANEOUS
  Administered 2020-10-31: 11 [IU] via SUBCUTANEOUS
  Administered 2020-10-31 – 2020-11-01 (×2): 3 [IU] via SUBCUTANEOUS
  Administered 2020-11-01: 4 [IU] via SUBCUTANEOUS
  Administered 2020-11-01 – 2020-11-02 (×2): 3 [IU] via SUBCUTANEOUS
  Administered 2020-11-02: 7 [IU] via SUBCUTANEOUS
  Administered 2020-11-02 – 2020-11-03 (×2): 4 [IU] via SUBCUTANEOUS
  Administered 2020-11-03: 7 [IU] via SUBCUTANEOUS
  Administered 2020-11-03 – 2020-11-04 (×2): 3 [IU] via SUBCUTANEOUS
  Administered 2020-11-04: 4 [IU] via SUBCUTANEOUS
  Administered 2020-11-05: 3 [IU] via SUBCUTANEOUS
  Administered 2020-11-05: 7 [IU] via SUBCUTANEOUS
  Administered 2020-11-05: 4 [IU] via SUBCUTANEOUS
  Administered 2020-11-06: 3 [IU] via SUBCUTANEOUS
  Administered 2020-11-06: 4 [IU] via SUBCUTANEOUS

## 2020-10-28 MED ORDER — TRAVASOL 10 % IV SOLN
INTRAVENOUS | Status: DC
  Filled 2020-10-28: qty 594

## 2020-10-28 MED ORDER — INSULIN GLARGINE-YFGN 100 UNIT/ML ~~LOC~~ SOLN
35.0000 [IU] | Freq: Every day | SUBCUTANEOUS | Status: DC
  Administered 2020-10-28: 35 [IU] via SUBCUTANEOUS
  Filled 2020-10-28: qty 0.35

## 2020-10-28 MED ORDER — POLYVINYL ALCOHOL 1.4 % OP SOLN
1.0000 [drp] | OPHTHALMIC | Status: DC | PRN
  Administered 2020-10-28 – 2020-10-29 (×2): 1 [drp] via OPHTHALMIC
  Filled 2020-10-28: qty 15

## 2020-10-28 MED ORDER — ENSURE ENLIVE PO LIQD
237.0000 mL | Freq: Three times a day (TID) | ORAL | Status: DC
  Administered 2020-10-28 – 2020-11-06 (×27): 237 mL via ORAL

## 2020-10-28 MED ORDER — OXYCODONE HCL 5 MG PO TABS
5.0000 mg | ORAL_TABLET | ORAL | Status: DC | PRN
  Administered 2020-10-28 – 2020-11-03 (×7): 5 mg via ORAL
  Filled 2020-10-28 (×7): qty 1

## 2020-10-28 MED ORDER — POLYETHYLENE GLYCOL 3350 17 G PO PACK: 17.0000 g | PACK | Freq: Every day | ORAL | Status: DC | PRN

## 2020-10-28 MED ORDER — POTASSIUM PHOSPHATES 15 MMOLE/5ML IV SOLN
20.0000 mmol | Freq: Once | INTRAVENOUS | Status: AC
  Administered 2020-10-28: 20 mmol via INTRAVENOUS
  Filled 2020-10-28: qty 6.67

## 2020-10-28 MED ORDER — MAGNESIUM SULFATE 2 GM/50ML IV SOLN
2.0000 g | Freq: Once | INTRAVENOUS | Status: AC
  Administered 2020-10-28: 2 g via INTRAVENOUS
  Filled 2020-10-28: qty 50

## 2020-10-28 MED ORDER — INSULIN ASPART 100 UNIT/ML IJ SOLN
0.0000 [IU] | Freq: Every day | INTRAMUSCULAR | Status: DC
Start: 2020-10-28 — End: 2020-11-07
  Administered 2020-10-28: 2 [IU] via SUBCUTANEOUS
  Administered 2020-10-29: 4 [IU] via SUBCUTANEOUS

## 2020-10-28 MED ORDER — CARVEDILOL 3.125 MG PO TABS
3.1250 mg | ORAL_TABLET | Freq: Two times a day (BID) | ORAL | Status: DC
  Administered 2020-10-28 – 2020-10-29 (×2): 3.125 mg via ORAL
  Filled 2020-10-28 (×2): qty 1

## 2020-10-28 NOTE — Progress Notes (Signed)
  Speech Language Pathology Treatment: Dysphagia  Patient Details Name: Mark Chang MRN: [id number] DOB: June 19, 1945 Today's Date: 10/28/2020 Time: [id number] SLP Time Calculation (min) (ACUTE ONLY): 20 min  Assessment / Plan / Recommendation Clinical Impression  Patient seen to address swallow function goals with SLP observing patient with PO intake of lunch meal. Surgery has advanced patient's diet from full liquids to soft (regular texture solids). After SLP assisted in cutting up chicken breast, patient able to feed self. He at cooked green beans, mashed potatoes and chicken with gravy. Prolonged mastication observed however suspect due to patient being edentulous. No overt s/s aspiration or penetration observed. Patient did have mild amount of PO's in mouth when meal complete, however he continued to actively manage and oral cavity was cleared when SLP left room. Patient appears to be tolerating current diet well. SLP plans to see patient one more time prior to discharge to ensure continued diet toleration.   HPI HPI: Patient is a 75 y.o. male with PMH: DM-2, intellectual disability, CKD-3 who presented to ED from SNF with c/o abdominal pain, diarrhea, low BP. In ED, patient was septic, initial BP 90/57, improved to 117/61 with IVF bolus. 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 He initially improved with medical management but then developed free air, so he had emergent right colectomy with anastomosis and left colectomy with end colostomy on 7/28. He is at high risk for ileus.      SLP Plan  Continue with current plan of care       Recommendations  Diet recommendations: Regular;Thin liquid Liquids provided via: Cup;Straw Medication Administration: Whole meds with puree Supervision: Patient able to self feed;Intermittent supervision to cue for compensatory strategies Compensations: Minimize environmental distractions;Slow  rate;Small sips/bites Postural Changes and/or Swallow Maneuvers: Seated upright 90 degrees                Oral Care Recommendations: Oral care BID;Staff/trained caregiver to provide oral care Follow up Recommendations: None SLP Visit Diagnosis: Dysphagia, unspecified (R13.10) Plan: Continue with current plan of care       Sonia Baller, MA, CCC-SLP Speech Therapy

## 2020-10-28 NOTE — Progress Notes (Signed)
PROGRESS NOTE    Mark Chang  [id number] DOB: 08/04/1945 DOA: 10/19/2020 PCP: Terrill Mohr, NP     Brief Narrative:  Mark VENABLES is a 75 year old male with past medical history significant for type 2 diabetes mellitus, essential hypertension, CKD stage IIIa, intellectual disability who resides at SNF who initially presented to [facility] ED with complaint of abdominal pain, diarrhea and was found to be hypotensive.  Per SNF staff, he reported abdominal pain for the last few weeks.     In the ED, he was found to have a blood pressure of 68/40, temperature 100.5 F, HR 105, lactic acid 2.2, WBC count 22.7.  CT abdomen/pelvis showed minimal amount of free air, cecal mass probably sigmoid colon with concern for perforated viscus.  Gastroenterology and general surgery were consulted.  Patient admitted for management of abdominal pain secondary to cecal mass with likely perforated viscus.  Patient was initially monitored with conservative management, could not get a hold of family or legal guardian.  However, patient continued to clinically decline and patient ultimately underwent right colectomy with anastomosis, left colectomy with end colostomy by [name] on 7/28.  Postoperatively, patient remained in respiratory acidosis and was left on mechanical ventilation, required pressors.  He was transferred to ICU.  He was extubated 7/29 and transferred back to [facility] service on 7/30.  TPN was started 7/31.  New events last 24 hours / Subjective: Patient without any complaints, tolerated soft diet today.  Denies any nausea, abdominal pain.  Assessment & Plan:   Principal Problem:   Perforated abdominal viscus Active Problems:   Acute respiratory failure (HCC)   Intellectual disability   AKI (acute kidney injury) ([place])   Sepsis (HCC)   DM (diabetes mellitus), type 2 with renal complications (HCC)   CKD (chronic kidney disease), stage III (HCC)   Mass of cecum   Pneumatosis coli   Colon  obstruction (HCC)    Septic shock in the setting of perforated abdominal viscus -Status post right colectomy with anastomosis, left colectomy with end colostomy by [name] 7/28 -Hemodynamically improved -TPN initiated -Completed antibiotics -General surgery following   Paroxysmal atrial fibrillation with RVR -Cardiology signed off 7/27 -Continue amiodarone, Coreg   AKI on CKD stage IIIa -Resolved   Essential hypertension -Continue amlodipine, Coreg   Type 2 diabetes mellitus -Semglee, sliding scale insulin.  Dose adjusted today     DVT prophylaxis:  enoxaparin (LOVENOX) injection 40 mg Start: 10/25/20 1400 Place and maintain sequential compression device Start: 10/22/20 0852 SCDs Start: 10/20/20 0026  Code Status:     Code Status Orders  (From admission, onward)           Start     Ordered   10/20/20 0026  Full code  Continuous        10/20/20 0028           Code Status History     Date Active Date Inactive Code Status Order ID Comments User Context   03/20/2017 2118 03/24/2017 2249 Full Code [id number]  Vianne Bulls, MD ED   07/18/2016 2100 07/23/2016 1735 Full Code [id number]  Luz Brazen, MD ED      Family Communication: No family at bedside Disposition Plan:  Status is: Inpatient  Remains inpatient appropriate because:IV treatments appropriate due to intensity of illness or inability to take PO and Inpatient level of care appropriate due to severity of illness  Dispo: The patient is from: SNF  Anticipated d/c is to: SNF              Patient currently is not medically stable to d/c.   Difficult to place patient No    Antimicrobials:  Anti-infectives (From admission, onward)    Start     Dose/Rate Route Frequency Ordered Stop   10/27/20 1400  ceFEPIme (MAXIPIME) 2 g in sodium chloride 0.9 % 100 mL IVPB        2 g 200 mL/hr over 30 Minutes Intravenous Every 8 hours 10/27/20 0927 10/27/20 2250   10/25/20 1400   ceFEPIme (MAXIPIME) 2 g in sodium chloride 0.9 % 100 mL IVPB  Status:  Discontinued        2 g 200 mL/hr over 30 Minutes Intravenous Every 12 hours 10/25/20 0729 10/27/20 0927   10/23/20 1000  ceFEPIme (MAXIPIME) 2 g in sodium chloride 0.9 % 100 mL IVPB  Status:  Discontinued        2 g 200 mL/hr over 30 Minutes Intravenous Every 8 hours 10/23/20 0833 10/25/20 0729   10/21/20 1000  ceFEPIme (MAXIPIME) 2 g in sodium chloride 0.9 % 100 mL IVPB  Status:  Discontinued        2 g 200 mL/hr over 30 Minutes Intravenous Every 12 hours 10/21/20 0742 10/23/20 0833   10/21/20 0600  metroNIDAZOLE (FLAGYL) IVPB 500 mg        500 mg 100 mL/hr over 60 Minutes Intravenous Every 8 hours 10/20/20 0028 10/28/20 0759   10/20/20 2200  ceFEPIme (MAXIPIME) 2 g in sodium chloride 0.9 % 100 mL IVPB  Status:  Discontinued        2 g 200 mL/hr over 30 Minutes Intravenous Every 24 hours 10/20/20 0048 10/21/20 0742   10/19/20 2030  ceFEPIme (MAXIPIME) 2 g in sodium chloride 0.9 % 100 mL IVPB        2 g 200 mL/hr over 30 Minutes Intravenous  Once 10/19/20 2029 10/20/20 0033   10/19/20 2030  metroNIDAZOLE (FLAGYL) IVPB 500 mg        500 mg 100 mL/hr over 60 Minutes Intravenous  Once 10/19/20 2029 10/19/20 2210        Objective: Vitals:   10/28/20 0700 10/28/20 0808 10/28/20 0825 10/28/20 0900  BP: (!) 158/57   (!) 162/50  Pulse: 95   97  Resp: (!) 28   (!) 28  Temp:  97.6 F (36.4 C)    TempSrc:  Oral    SpO2: 96%  99% 99%  Weight:      Height:        Intake/Output Summary (Last 24 hours) at 10/28/2020 1200 Last data filed at 10/28/2020 1100 Gross per 24 hour  Intake 3523.71 ml  Output 5995 ml  Net -2471.29 ml    Filed Weights   10/19/20 2019 10/26/20 0944 10/27/20 0600  Weight: 122 kg 113.2 kg 117.3 kg    Examination: General exam: Appears calm and comfortable  Respiratory system: Clear to auscultation. Respiratory effort normal. Cardiovascular system: S1 & S2 heard. No pedal  edema. Gastrointestinal system: Abdomen is nondistended, soft and nontender. Normal bowel sounds heard. + Colostomy with liquid stool Central nervous system: Alert and oriented. Extremities: Symmetric in appearance bilaterally  Skin: No rashes, lesions or ulcers on exposed skin  Psychiatry: Stable  Data Reviewed: I have personally reviewed following labs and imaging studies  CBC: Recent Labs  Lab 10/24/20 1609 10/25/20 0300 10/26/20 0431 10/27/20 0300 10/28/20 0456  WBC 11.5* 23.4* 19.1* 18.8*  15.4*  NEUTROABS  --   --   --  14.3* 10.8*  HGB 9.5* 10.0* 9.6* 8.7* 8.8*  HCT 29.8* 30.6* 29.3* 27.7* 27.7*  MCV 89.8 86.7 88.3 88.5 89.6  PLT 276 414* 280 249 [id number]    Basic Metabolic Panel: Recent Labs  Lab 10/24/20 0308 10/24/20 1430 10/24/20 1609 10/24/20 1614 10/25/20 0300 10/26/20 0431 10/27/20 0300 10/28/20 0456  NA 150*   < > 146*  --  146* 149* 139 135  K 2.9*   < > 3.0*  --  3.6 3.6 3.2* 3.5  CL 122*  --  119*  --  119* 120* 112* 108  CO2 23  --  21*  --  22 21* 20* 22  GLUCOSE 104*  --  134*  --  158* 118* 160* 197*  BUN 30*  --  29*  --  33* 30* 26* 22  CREATININE 1.22  --  1.22  --  1.49* 1.20 1.11 1.00  CALCIUM 8.0*  --  7.1*  --  7.1* 7.5* 7.1* 7.1*  MG 2.0  --   --  1.4* 1.8 2.1 1.8 1.7  PHOS 1.7*  --   --   --  1.9* 2.4* 1.5* 2.2*   < > = values in this interval not displayed.    GFR: Estimated Creatinine Clearance: 78.2 mL/min (by C-G formula based on SCr of 1 mg/dL). Liver Function Tests: Recent Labs  Lab 10/24/20 0308 10/25/20 0300 10/26/20 0431 10/27/20 0300 10/28/20 0456  AST 35 23 70* 41 20  ALT '30 18 29 24 18  '$ ALKPHOS 82 48 53 60 53  BILITOT 0.5 0.8 0.5 0.5 0.4  PROT 6.3* 5.0* 5.3* 5.3* 5.3*  ALBUMIN 2.1* 2.0* 1.9* 1.7* 1.7*    No results for input(s): LIPASE, AMYLASE in the last 168 hours. No results for input(s): AMMONIA in the last 168 hours. Coagulation Profile: No results for input(s): INR, PROTIME in the last 168  hours.  Cardiac Enzymes: No results for input(s): CKTOTAL, CKMB, CKMBINDEX, TROPONINI in the last 168 hours. BNP (last 3 results) No results for input(s): PROBNP in the last 8760 hours. HbA1C: No results for input(s): HGBA1C in the last 72 hours. CBG: Recent Labs  Lab 10/27/20 1950 10/27/20 2356 10/28/20 0316 10/28/20 0739 10/28/20 1158  GLUCAP 184* 211* 209* 182* 322*    Lipid Profile: Recent Labs    10/27/20 0300 10/28/20 0456  TRIG 162* 198*    Thyroid Function Tests: No results for input(s): TSH, T4TOTAL, FREET4, T3FREE, THYROIDAB in the last 72 hours. Anemia Panel: No results for input(s): VITAMINB12, FOLATE, FERRITIN, TIBC, IRON, RETICCTPCT in the last 72 hours. Sepsis Labs: Recent Labs  Lab 10/24/20 1609  LATICACIDVEN 1.5     Recent Results (from the past 240 hour(s))  Resp Panel by RT-PCR (Flu A&B, Covid) Nasopharyngeal Swab     Status: None   Collection Time: 10/19/20  8:29 PM   Specimen: Nasopharyngeal Swab; Nasopharyngeal(NP) swabs in vial transport medium  Result Value Ref Range Status   SARS Coronavirus 2 by RT PCR NEGATIVE NEGATIVE Final    Comment: (NOTE) SARS-CoV-2 target nucleic acids are NOT DETECTED.  The SARS-CoV-2 RNA is generally detectable in upper respiratory specimens during the acute phase of infection. The lowest concentration of SARS-CoV-2 viral copies this assay can detect is 138 copies/mL. A negative result does not preclude SARS-Cov-2 infection and should not be used as the sole basis for treatment or other patient management decisions. A negative result  may occur with  improper specimen collection/handling, submission of specimen other than nasopharyngeal swab, presence of viral mutation(s) within the areas targeted by this assay, and inadequate number of viral copies(<138 copies/mL). A negative result must be combined with clinical observations, patient history, and epidemiological information. The expected result is  Negative.  Fact Sheet for Patients:  EntrepreneurPulse.com.au  Fact Sheet for Healthcare Providers:  IncredibleEmployment.be  This test is no t yet approved or cleared by the [place] FDA and  has been authorized for detection and/or diagnosis of SARS-CoV-2 by FDA under an Emergency Use Authorization (EUA). This EUA will remain  in effect (meaning this test can be used) for the duration of the COVID-19 declaration under Section 564(b)(1) of the Act, 21 U.S.C.section 360bbb-3(b)(1), unless the authorization is terminated  or revoked sooner.       Influenza A by PCR NEGATIVE NEGATIVE Final   Influenza B by PCR NEGATIVE NEGATIVE Final    Comment: (NOTE) The Xpert Xpress SARS-CoV-2/FLU/RSV plus assay is intended as an aid in the diagnosis of influenza from Nasopharyngeal swab specimens and should not be used as a sole basis for treatment. Nasal washings and aspirates are unacceptable for Xpert Xpress SARS-CoV-2/FLU/RSV testing.  Fact Sheet for Patients: EntrepreneurPulse.com.au  Fact Sheet for Healthcare Providers: IncredibleEmployment.be  This test is not yet approved or cleared by the [place] FDA and has been authorized for detection and/or diagnosis of SARS-CoV-2 by FDA under an Emergency Use Authorization (EUA). This EUA will remain in effect (meaning this test can be used) for the duration of the COVID-19 declaration under Section 564(b)(1) of the Act, 21 U.S.C. section 360bbb-3(b)(1), unless the authorization is terminated or revoked.  Performed at [facility], [place] [address], [place], [place] [address]   Blood Culture (routine x 2)     Status: None   Collection Time: 10/19/20  8:29 PM   Specimen: BLOOD  Result Value Ref Range Status   Specimen Description   Final    BLOOD BLOOD LEFT ARM Performed at [facility] [address] [place],  [place] [address]    Special Requests   Final    BOTTLES DRAWN AEROBIC AND ANAEROBIC Blood Culture adequate volume Performed at [place] [address], [place], [place] [address]    Culture   Final    NO GROWTH 5 DAYS Performed at [facility], [place] [address], [place], [place] [address]    Report Status 10/24/2020 FINAL  Final  Urine Culture     Status: None   Collection Time: 10/19/20  8:29 PM   Specimen: In/Out Cath Urine  Result Value Ref Range Status   Specimen Description   Final    IN/OUT CATH URINE Performed at [facility] [address] [place], [place] [address]    Special Requests   Final    NONE Performed at [facility], [place] [address] [place], [place] [address]    Culture   Final    NO GROWTH Performed at [facility], [place] [address], [place], [place] [address]    Report Status 10/21/2020 FINAL  Final  Blood Culture (routine x 2)     Status: None   Collection Time: 10/20/20  4:00 AM   Specimen: BLOOD  Result Value Ref Range Status   Specimen Description   Final    BLOOD BLOOD RIGHT ARM Performed at [facility] [address] [place],  [place] [address]    Special Requests   Final    BOTTLES DRAWN AEROBIC AND ANAEROBIC Blood Culture adequate volume Performed at [facility] [address] [place], [place] [address]    Culture   Final    NO GROWTH 6 DAYS Performed at [facility], [place] [address] [place], [place] [address]    Report Status 10/26/2020 FINAL  Final  MRSA Next Gen by PCR, Nasal     Status: None   Collection Time: 10/20/20  7:35 AM   Specimen: Nasal Mucosa; Nasal Swab  Result Value Ref Range Status   MRSA by PCR Next Gen NOT DETECTED NOT DETECTED Final    Comment: (NOTE) The GeneXpert MRSA Assay (FDA approved for NASAL specimens only), is one component of a comprehensive MRSA colonization surveillance program. It is not  intended to diagnose MRSA infection nor to guide or monitor treatment for MRSA infections. Test performance is not FDA approved in patients less than 31 years old. Performed at [facility], [place] [address], [place], [place] [address]   C Difficile Quick Screen w PCR reflex     Status: None   Collection Time: 10/20/20  4:58 PM   Specimen: STOOL  Result Value Ref Range Status   C Diff antigen NEGATIVE NEGATIVE Final   C Diff toxin NEGATIVE NEGATIVE Final   C Diff interpretation No C. difficile detected.  Final    Comment: Performed at [facility], [place] [address] [place], [place] [address]  Culture, blood (routine x 2)     Status: None (Preliminary result)   Collection Time: 10/24/20  7:08 PM   Specimen: BLOOD  Result Value Ref Range Status   Specimen Description   Final    BLOOD RIGHT ANTECUBITAL Performed at [facility] [address], [place], [place] [address]    Special Requests   Final    BOTTLES DRAWN AEROBIC AND ANAEROBIC Blood Culture adequate volume Performed at [place] [address], [place], [place] [address]    Culture   Final    NO GROWTH 3 DAYS Performed at [facility], [place] [address], [place], [place] [address]    Report Status PENDING  Incomplete  Culture, blood (routine x 2)     Status: None (Preliminary result)   Collection Time: 10/24/20  7:08 PM   Specimen: BLOOD  Result Value Ref Range Status   Specimen Description   Final    BLOOD LEFT ANTECUBITAL Performed at [facility] [address] [place], [place] [address]    Special Requests   Final    BOTTLES DRAWN AEROBIC AND ANAEROBIC Blood Culture results may not be optimal due to an inadequate volume of blood received in culture bottles Performed at [facility] [address], [place], [place] [address]    Culture   Final    NO GROWTH 3 DAYS Performed at [facility], [place] [address], [place], [place] [address]    Report Status PENDING  Incomplete       Radiology Studies: CT HEAD WO CONTRAST  Result Date: 10/26/2020 CLINICAL DATA:  Neuro deficit, acute, stroke suspected. EXAM: CT HEAD WITHOUT CONTRAST TECHNIQUE: Contiguous axial images were obtained from the base of the skull through the vertex without intravenous contrast. COMPARISON:  Brain MRI 07/21/2016.  Head CT 07/18/2016. FINDINGS: Brain: Mild generalized cerebral and cerebellar atrophy. Redemonstrated chronic lacunar infarct within the  left corona radiata/basal ganglia. Redemonstrated chronic lacunar infarct within the left thalamus. Background moderate patchy and ill-defined hypoattenuation, nonspecific but compatible with chronic small vessel ischemic disease. There is no acute intracranial hemorrhage. No demarcated cortical infarct. No extra-axial fluid collection. No evidence of an intracranial mass. No midline shift. Vascular: No hyperdense vessel.  Atherosclerotic calcifications Skull: No calvarial fracture or focal suspicious osseous lesion. Sinuses/Orbits: Visualized orbits show no acute finding. Trace bilateral ethmoid and left sphenoid sinus mucosal thickening. IMPRESSION: No evidence of acute intracranial abnormality. Redemonstrated chronic lacunar infarcts within the left corona radiata/basal ganglia and left thalamus. Background moderate chronic small vessel ischemic changes within the cerebral white matter. Mild generalized parenchymal atrophy. Electronically Signed   By: [name] DO   On: 10/26/2020 14:20   [place] EKG SITE RITE  Result Date: 10/26/2020 If Site Rite image not attached, placement could not be confirmed due to current cardiac rhythm.     Scheduled Meds:  amiodarone  200 mg Oral BID   Chlorhexidine Gluconate Cloth  6 each Topical Daily   docusate sodium  100 mg Oral BID   enoxaparin (LOVENOX) injection  40 mg Subcutaneous Q24H   feeding supplement  237 mL Oral TID  BM   insulin aspart  0-20 Units Subcutaneous TID WC   insulin aspart  0-5 Units Subcutaneous QHS   insulin glargine-yfgn  35 Units Subcutaneous QHS   lisinopril  40 mg Oral Daily   mouth rinse  15 mL Mouth Rinse BID   pneumococcal 23 valent vaccine  0.5 mL Intramuscular Tomorrow-1000   polycarbophil  625 mg Oral BID   sodium chloride flush  10-40 mL Intracatheter Q12H   sodium chloride flush  10-40 mL Intracatheter Q12H   Continuous Infusions:  sodium chloride 10 mL/hr at 10/27/20 1726   potassium PHOSPHATE IVPB (in mmol) 20 mmol (10/28/20 1017)   TPN ADULT (ION) 60 mL/hr at 10/28/20 0808   TPN ADULT (ION)       LOS: 8 days      Time spent: 25 minutes   Dessa Phi, DO [facility] 10/28/2020, 12:00 PM   Available via Epic secure chat 7am-7pm After these hours, please refer to coverage provider listed on [email or web address]

## 2020-10-28 NOTE — Progress Notes (Signed)
PHARMACY - TOTAL PARENTERAL NUTRITION CONSULT NOTE   Indication: Prolonged ileus  Patient Measurements: Height: 5' 7.99" (172.7 cm) Weight: 117.3 kg (258 lb 9.6 oz) IBW/kg (Calculated) : 68.38 TPN AdjBW (KG): 80.6 Body mass index is 39.33 kg/m.  Assessment: 75 yo male s/p right colectomy with anastomosis and left colectomy with end colostomy on 7/28 now to start TPN per CCS orders for prolonged ileus  Glucose / Insulin: CBGs <150 prior to start of TPN, note patient with DM on Lantus 40 units daily and 10 units qhs prior to admission along with Novolog 8 units TID meal coverage, SSI and Trulicity once weekly - CBGs after TPN started now > 200, Semglee increased 30 units qhs on 7/31, 26 units SSI given in past 24hr Electrolytes: Na 135 down, Cl 108 down, phos improved 2.2 after replacement yesterday, K 3.5 improved, CO2 22 up, mag 1.7 down, Corr Ca 8.94 WNL Renal: CKDIIIb, SCr 1.1 stable, BUN 22 improved, UOP 4.1L/24hr Hepatic: AST/ALT normalized, TG (7/31) 162 - increased 198 (8/1) Alb/Prealbumin: alb 1.7, prealbumin 7.6 Intake / Output; MIVF: 1.2L colostomy/stool output last 24hr, 420 mL drain output (increased), 4.1L UOP, IVF stopped 7/31 GI Imaging: GI Surgeries / Procedures: see assessment above  Central access: 10/24/20 arterial line, 7/31 triple lumen PICC TPN start date: 10/26/20  Nutritional Goals (per RD recommendation on 10/26/20): kCal: 2300-2500, Protein: 105-120, Fluid: 2L/day  Goal TPN rate is 90 mL/hr (provides 119 g of protein and 2225 kcals per day)  Current Nutrition:  Full liquids >> advance to soft diet and add Ensure TID 8/1 TPN  Plan:  This AM: Mg Sulfate 2g IV, Kphos 40mol IV x 1 (provides ~30 mEq K+)  Per CCS, TPN to 1/2 rate 45 ml/hr (goal is 90 ml/hr) This will provide 59g protein, 1112 kcal per day Electrolytes in TPN:  Na 734m/L (incr),  K 5092mL,  Ca 5mE10m,  Mg 5mEq83m  Phos 15mmo5m  Max Acetate Add standard MVI and trace elements to  TPN Increase Resistant ACHS SSI, Lantus 30 units qhs per TRH Monitor TPN labs on Mon/Thurs and as needed  Mark Chang WPeggyann JubamD, BCPS Pharmacy: [phone],7:21 AM

## 2020-10-28 NOTE — Progress Notes (Signed)
Inpatient Diabetes Program Recommendations  AACE/ADA: New Consensus Statement on Inpatient Glycemic Control (2015)  Target Ranges:  Prepandial:   less than 140 mg/dL      Peak postprandial:   less than 180 mg/dL (1-2 hours)      Critically ill patients:  140 - 180 mg/dL   Lab Results  Component Value Date   GLUCAP 182 (H) 10/28/2020   HGBA1C 6.5 (H) 10/19/2020    Review of Glycemic Control Results for Mark Chang, Mark Chang (MRN [id number]) as of 10/28/2020 11:21  Ref. Range 10/27/2020 07:50 10/27/2020 11:42 10/27/2020 16:05 10/27/2020 19:50 10/27/2020 23:56 10/28/2020 03:16 10/28/2020 07:39  Glucose-Capillary Latest Ref Range: 70 - 99 mg/dL 181 (H) 233 (H) 204 (H) 184 (H) 211 (H) 209 (H) 182 (H)   Diabetes history: DM 2 Outpatient Diabetes medications: Basaglar 40 units in am and 10 units QHS, Novolog 8 units TID with meals, Trulicity  Current orders for Inpatient glycemic control:  Novolog 0-20 units tid + hs Semglee 30 units Qhs  Ensure Enlive tid between meals  Note: pt on TPN (insulin not added)  Inpatient Diabetes Program Recommendations:    -   Increase Semglee 35 units  Thanks, Tama Headings RN, MSN, BC-ADM Inpatient Diabetes Coordinator Team Pager [phone] (8a-5p)

## 2020-10-28 NOTE — Consult Note (Signed)
[facility] Nurse ostomy follow up Patient receiving care in [place]. Primary RN in room for ostomy education and pouch change session. Stoma type/location: LUQ colostomy Stomal assessment/size: oval, approximately 1 3/4 inches top to bottom; 2 inches side to side. Pattern for next pouch left at room computer station. Stoma red, moist, sutures intact. Peristomal assessment: intact Treatment options for stomal/peristomal skin: barrier ring Output: small amount of green liquid in existing pouch Ostomy pouching: 1pc. Keep a one piece ostomy pouch [name] [phone]) and barrier rings [name] [phone]) at bedside at all times. Use pattern at computer workstation to cut pouch opening.  Education provided: Explained how to remove, clean around stoma, measure, cut new pouch opening, and apply pouch.  The patient lives in a SNF per Dr. Jeannine Kitten note from 10/27/20.  That is a good thing.  The patient has limited capacity to learn ostomy self care skills. Enrolled patient in [place] Start Discharge program: No.  The SNF should be able to provide ostomy care supplies and continue ostomy management upon discharge.  Val Riles, RN, MSN, CWOCN, CNS-BC, pager [phone]

## 2020-10-28 NOTE — Progress Notes (Signed)
Progress Note  4 Days Post-Op  Subjective: Patient denies abdominal pain. Denies nausea. Reports he wants pancakes or chicken with gravy. Having liquid colostomy output, does not appear that WOC was consulted post-op so I will consult today.   Objective: Vital signs in last 24 hours: Temp:  [97.7 F (36.5 C)-99.6 F (37.6 C)] 97.7 F (36.5 C) (08/01 0411) Pulse Rate:  [65-99] 95 (08/01 0700) Resp:  [13-29] 28 (08/01 0700) BP: (136-177)/(44-69) 158/57 (08/01 0700) SpO2:  [96 %-100 %] 96 % (08/01 0700) FiO2 (%):  [36 %] 36 % (07/31 0900) Last BM Date: 10/27/00  Intake/Output from previous day: 07/31 0701 - 08/01 0700 In: 2941.3 [P.O.:240; I.V.:1792.7; IV Piggyback:908.7] Out: [id number] [Urine:4100; Drains:420; [id number] Intake/Output this shift: No intake/output data recorded.  PE: General: pleasant, WD, obese male who is laying in bed in NAD Heart: regular, rate, and rhythm.   Lungs: Respiratory effort nonlabored Abd: soft, NT, moderately distended, drain in pelvis with SS fluid, stoma beefy red with thin liquid stool in pouch, midline incision c/d/I with staples in place and penrose inferiorly   Lab Results:  Recent Labs    10/27/20 0300 10/28/20 0456  WBC 18.8* 15.4*  HGB 8.7* 8.8*  HCT 27.7* 27.7*  PLT 249 223   BMET Recent Labs    10/27/20 0300 10/28/20 0456  NA 139 135  K 3.2* 3.5  CL 112* 108  CO2 20* 22  GLUCOSE 160* 197*  BUN 26* 22  CREATININE 1.11 1.00  CALCIUM 7.1* 7.1*   PT/INR No results for input(s): LABPROT, INR in the last 72 hours. CMP     Component Value Date/Time   NA 135 10/28/2020 0456   K 3.5 10/28/2020 0456   CL 108 10/28/2020 0456   CO2 22 10/28/2020 0456   GLUCOSE 197 (H) 10/28/2020 0456   BUN 22 10/28/2020 0456   CREATININE 1.00 10/28/2020 0456   CALCIUM 7.1 (L) 10/28/2020 0456   PROT 5.3 (L) 10/28/2020 0456   ALBUMIN 1.7 (L) 10/28/2020 0456   AST 20 10/28/2020 0456   ALT 18 10/28/2020 0456   ALKPHOS 53 10/28/2020  0456   BILITOT 0.4 10/28/2020 0456   GFRNONAA >60 10/28/2020 0456   GFRAA >60 03/24/2017 0238   Lipase  No results found for: LIPASE     Studies/Results: DG Abd 1 View  Result Date: 10/26/2020 CLINICAL DATA:  Encounter for NG tube placement EXAM: ABDOMEN - 1 VIEW COMPARISON:  10/24/20 FINDINGS: Interval placement of nasogastric tube. The tip and side port project over the body of the stomach. Calcified gallstones are noted measuring up to 1.1 cm. IMPRESSION: Satisfactory placement of nasogastric tube with tip and side port in the expected location of the body of stomach. Electronically Signed   By: Kerby Moors M.D.   On: 10/26/2020 09:12   CT HEAD WO CONTRAST  Result Date: 10/26/2020 CLINICAL DATA:  Neuro deficit, acute, stroke suspected. EXAM: CT HEAD WITHOUT CONTRAST TECHNIQUE: Contiguous axial images were obtained from the base of the skull through the vertex without intravenous contrast. COMPARISON:  Brain MRI 07/21/2016.  Head CT 07/18/2016. FINDINGS: Brain: Mild generalized cerebral and cerebellar atrophy. Redemonstrated chronic lacunar infarct within the left corona radiata/basal ganglia. Redemonstrated chronic lacunar infarct within the left thalamus. Background moderate patchy and ill-defined hypoattenuation, nonspecific but compatible with chronic small vessel ischemic disease. There is no acute intracranial hemorrhage. No demarcated cortical infarct. No extra-axial fluid collection. No evidence of an intracranial mass. No midline shift. Vascular: No hyperdense  vessel.  Atherosclerotic calcifications Skull: No calvarial fracture or focal suspicious osseous lesion. Sinuses/Orbits: Visualized orbits show no acute finding. Trace bilateral ethmoid and left sphenoid sinus mucosal thickening. IMPRESSION: No evidence of acute intracranial abnormality. Redemonstrated chronic lacunar infarcts within the left corona radiata/basal ganglia and left thalamus. Background moderate chronic small vessel  ischemic changes within the cerebral white matter. Mild generalized parenchymal atrophy. Electronically Signed   By: [name] DO   On: 10/26/2020 14:20   [place] EKG SITE RITE  Result Date: 10/26/2020 If Site Rite image not attached, placement could not be confirmed due to current cardiac rhythm.   Anti-infectives: Anti-infectives (From admission, onward)    Start     Dose/Rate Route Frequency Ordered Stop   10/27/20 1400  ceFEPIme (MAXIPIME) 2 g in sodium chloride 0.9 % 100 mL IVPB        2 g 200 mL/hr over 30 Minutes Intravenous Every 8 hours 10/27/20 0927 10/27/20 2250   10/25/20 1400  ceFEPIme (MAXIPIME) 2 g in sodium chloride 0.9 % 100 mL IVPB  Status:  Discontinued        2 g 200 mL/hr over 30 Minutes Intravenous Every 12 hours 10/25/20 0729 10/27/20 0927   10/23/20 1000  ceFEPIme (MAXIPIME) 2 g in sodium chloride 0.9 % 100 mL IVPB  Status:  Discontinued        2 g 200 mL/hr over 30 Minutes Intravenous Every 8 hours 10/23/20 0833 10/25/20 0729   10/21/20 1000  ceFEPIme (MAXIPIME) 2 g in sodium chloride 0.9 % 100 mL IVPB  Status:  Discontinued        2 g 200 mL/hr over 30 Minutes Intravenous Every 12 hours 10/21/20 0742 10/23/20 0833   10/21/20 0600  metroNIDAZOLE (FLAGYL) IVPB 500 mg        500 mg 100 mL/hr over 60 Minutes Intravenous Every 8 hours 10/20/20 0028 10/28/20 0759   10/20/20 2200  ceFEPIme (MAXIPIME) 2 g in sodium chloride 0.9 % 100 mL IVPB  Status:  Discontinued        2 g 200 mL/hr over 30 Minutes Intravenous Every 24 hours 10/20/20 0048 10/21/20 0742   10/19/20 2030  ceFEPIme (MAXIPIME) 2 g in sodium chloride 0.9 % 100 mL IVPB        2 g 200 mL/hr over 30 Minutes Intravenous  Once 10/19/20 2029 10/20/20 0033   10/19/20 2030  metroNIDAZOLE (FLAGYL) IVPB 500 mg        500 mg 100 mL/hr over 60 Minutes Intravenous  Once 10/19/20 2029 10/19/20 2210        Assessment/Plan 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 - POD4 - tolerating FLD - advance to soft - colostomy output >1 L, add BID fiber supplement today  - pelvic drain is SS - surgical path pending  - can start to wean TPN today and add nutritional supplements  - mobilize - WOC consult for new stoma, although patient will likely not be able to care for stoma independently  Severe protein calorie malnutrition - prealbumin 7.6, PO intake improving  FEN: SOFT diet, ok to 1/2 TPN, add ensure  VTE: LMWH ID: [email or web address] 7/23>8/1  T2DM HTN CKD stage III Intellectual disability residing at SNF   LOS: 8 days    Norm Parcel, [facility] Surgery 10/28/2020, 8:15 AM Please see Amion for pager number during day hours  7:00am-4:30pm

## 2020-10-28 NOTE — TOC Progression Note (Signed)
Transition of Care [facility]) - Progression Note    Patient Details  Name: Mark Chang MRN: [id number] Date of Birth: Jan 06, 1946  [facility] of [facility]) CM/SW Contact  Leeroy Cha, RN Phone Number: 10/28/2020, 8:45 AM  Clinical Narrative:    4 Days Post-Op  Subjective: Patient denies abdominal pain. Denies nausea. Reports he wants pancakes or chicken with gravy. Having liquid colostomy output, does not appear that WOC was consulted post-op so I will consult today.    Objective: Vital signs in last 24 hours: Temp:  [97.7 F (36.5 C)-99.6 F (37.6 C)] 97.7 F (36.5 C) (08/01 0411) Pulse Rate:  [65-99] 95 (08/01 0700) Resp:  [13-29] 28 (08/01 0700) BP: (136-177)/(44-69) 158/57 (08/01 0700) SpO2:  [96 %-100 %] 96 % (08/01 0700) FiO2 (%):  [36 %] 36 % (07/31 0900) Last BM Date: 10/27/00   Intake/Output from previous day: 07/31 0701 - 08/01 0700 In: 2941.3 [P.O.:240; I.V.:1792.7; IV Piggyback:908.7] Out: [id number] [Urine:4100; Drains:420; [id number] Intake/Output this shift: No intake/output data recorded.   PE: General: pleasant, WD, obese male who is laying in bed in NAD Heart: regular, rate, and rhythm.   Lungs: Respiratory effort nonlabored Abd: soft, NT, moderately distended, drain in pelvis with SS fluid, stoma beefy red with thin liquid stool in pouch, midline incision c/d/I with staples in place and penrose inferiorly  TOC PLAN OF CARE:  To return to [facility] when stable.   Expected Discharge Plan: Home/Self Care Barriers to Discharge: Continued Medical Work up  Expected Discharge Plan and Services Expected Discharge Plan: Home/Self Care   Discharge Planning Services: CM Consult   Living arrangements for the past 2 months: Single Family Home                                       Social Determinants of Health (SDOH) Interventions    Readmission Risk Interventions No flowsheet data found.

## 2020-10-29 ENCOUNTER — Inpatient Hospital Stay (HOSPITAL_COMMUNITY): Payer: Medicare HMO

## 2020-10-29 ENCOUNTER — Other Ambulatory Visit: Payer: Self-pay

## 2020-10-29 DIAGNOSIS — R198 Other specified symptoms and signs involving the digestive system and abdomen: Secondary | ICD-10-CM | POA: Diagnosis not present

## 2020-10-29 LAB — BASIC METABOLIC PANEL
Anion gap: 3 — ABNORMAL LOW (ref 5–15)
BUN: 25 mg/dL — ABNORMAL HIGH (ref 8–23)
CO2: 24 mmol/L (ref 22–32)
Calcium: 7.2 mg/dL — ABNORMAL LOW (ref 8.9–10.3)
Chloride: 109 mmol/L (ref 98–111)
Creatinine, Ser: 1.01 mg/dL (ref 0.61–1.24)
GFR, Estimated: >60 mL/min (ref >60)
Glucose, Bld: 284 mg/dL — ABNORMAL HIGH (ref 70–99)
Potassium: 3.8 mmol/L (ref 3.5–5.1)
Sodium: 136 mmol/L (ref 135–145)

## 2020-10-29 LAB — CBC
HCT: 28.3 % — ABNORMAL LOW (ref 39.0–52.0)
Hemoglobin: 8.9 g/dL — ABNORMAL LOW (ref 13.0–17.0)
MCH: 28.5 pg (ref 26.0–34.0)
MCHC: 31.4 g/dL (ref 30.0–36.0)
MCV: 90.7 fL (ref 80.0–100.0)
Platelets: 228 10*3/uL (ref 150–400)
RBC: 3.12 MIL/uL — ABNORMAL LOW (ref 4.22–5.81)
RDW: 16.1 % — ABNORMAL HIGH (ref 11.5–15.5)
WBC: 17.5 10*3/uL — ABNORMAL HIGH (ref 4.0–10.5)
nRBC: 0.3 % — ABNORMAL HIGH (ref 0.0–0.2)

## 2020-10-29 LAB — GLUCOSE, CAPILLARY
Glucose-Capillary: 245 mg/dL — ABNORMAL HIGH (ref 70–99)
Glucose-Capillary: 325 mg/dL — ABNORMAL HIGH (ref 70–99)
Glucose-Capillary: 274 mg/dL — ABNORMAL HIGH (ref 70–99)
Glucose-Capillary: 307 mg/dL — ABNORMAL HIGH (ref 70–99)

## 2020-10-29 LAB — URINALYSIS, ROUTINE W REFLEX MICROSCOPIC
Bacteria, UA: NONE SEEN
Bilirubin Urine: NEGATIVE
Glucose, UA: >=500 mg/dL — AB
Ketones, ur: NEGATIVE mg/dL
Leukocytes,Ua: NEGATIVE
Nitrite: NEGATIVE
Protein, ur: 30 mg/dL — AB
Specific Gravity, Urine: 1.015 (ref 1.005–1.030)
pH: 6 (ref 5.0–8.0)

## 2020-10-29 LAB — MAGNESIUM: Magnesium: 1.9 mg/dL (ref 1.7–2.4)

## 2020-10-29 LAB — PHOSPHORUS: Phosphorus: 2.3 mg/dL — ABNORMAL LOW (ref 2.5–4.6)

## 2020-10-29 MED ORDER — INSULIN GLARGINE-YFGN 100 UNIT/ML ~~LOC~~ SOLN
40.0000 [IU] | Freq: Every day | SUBCUTANEOUS | Status: DC
  Administered 2020-10-29 – 2020-11-05 (×8): 40 [IU] via SUBCUTANEOUS
  Filled 2020-10-29 (×9): qty 0.4

## 2020-10-29 MED ORDER — AMLODIPINE BESYLATE 5 MG PO TABS
5.0000 mg | ORAL_TABLET | Freq: Every day | ORAL | Status: DC
  Administered 2020-10-29 – 2020-11-04 (×6): 5 mg via ORAL
  Filled 2020-10-29 (×6): qty 1

## 2020-10-29 MED ORDER — ADULT MULTIVITAMIN W/MINERALS CH
1.0000 | ORAL_TABLET | Freq: Every day | ORAL | Status: DC
  Administered 2020-10-29 – 2020-11-06 (×8): 1 via ORAL
  Filled 2020-10-29 (×8): qty 1

## 2020-10-29 MED ORDER — PROSOURCE PLUS PO LIQD
30.0000 mL | Freq: Two times a day (BID) | ORAL | Status: DC
  Administered 2020-10-29 – 2020-11-06 (×15): 30 mL via ORAL
  Filled 2020-10-29 (×15): qty 30

## 2020-10-29 MED ORDER — POTASSIUM PHOSPHATES 15 MMOLE/5ML IV SOLN
20.0000 mmol | Freq: Once | INTRAVENOUS | Status: AC
  Administered 2020-10-29: 20 mmol via INTRAVENOUS
  Filled 2020-10-29: qty 6.67

## 2020-10-29 MED ORDER — METOPROLOL TARTRATE 5 MG/5ML IV SOLN: 5.0000 mg | Freq: Four times a day (QID) | INTRAVENOUS | Status: DC | PRN

## 2020-10-29 MED ORDER — CARVEDILOL 6.25 MG PO TABS
6.2500 mg | ORAL_TABLET | Freq: Two times a day (BID) | ORAL | Status: DC
  Administered 2020-10-29 – 2020-11-06 (×13): 6.25 mg via ORAL
  Filled 2020-10-29 (×15): qty 1

## 2020-10-29 MED ORDER — MAGNESIUM SULFATE 2 GM/50ML IV SOLN
2.0000 g | Freq: Once | INTRAVENOUS | Status: AC
  Administered 2020-10-29: 2 g via INTRAVENOUS
  Filled 2020-10-29: qty 50

## 2020-10-29 MED ORDER — INSULIN ASPART 100 UNIT/ML IJ SOLN
5.0000 [IU] | Freq: Three times a day (TID) | INTRAMUSCULAR | Status: DC
  Administered 2020-10-29 – 2020-11-06 (×24): 5 [IU] via SUBCUTANEOUS

## 2020-10-29 NOTE — Progress Notes (Signed)
Progress Note  5 Days Post-Op  Subjective: Patient denies abdominal pain or nausea. Tolerated diet well yesterday and is awaiting pancakes this AM.   Objective: Vital signs in last 24 hours: Temp:  [97.9 F (36.6 C)-99.6 F (37.6 C)] 97.9 F (36.6 C) (08/02 0800) Pulse Rate:  [97-118] 101 (08/02 0600) Resp:  [22-31] 30 (08/02 0600) BP: (151-172)/(50-90) 165/61 (08/02 0600) SpO2:  [94 %-99 %] 96 % (08/02 0600) Last BM Date: 10/29/20  Intake/Output from previous day: 08/01 0701 - 08/02 0700 In: 2899.8 [P.O.:1600; I.V.:793.3; IV Piggyback:506.5] Out: [id number] [Urine:4300; Drains:615; [id number] Intake/Output this shift: Total I/O In: 877.4 [P.O.:125; I.V.:752.4] Out: 245 [Drains:120; Stool:125]  PE: General: pleasant, WD, obese male who is laying in bed in NAD Heart: regular, rate, and rhythm.   Lungs: Respiratory effort nonlabored Abd: soft, NT, moderately distended, drain in pelvis with SS fluid, stoma beefy red with thin liquid stool in pouch, midline incision c/d/I with staples in place and penrose inferiorly   Lab Results:  Recent Labs    10/28/20 0456 10/29/20 0421  WBC 15.4* 17.5*  HGB 8.8* 8.9*  HCT 27.7* 28.3*  PLT 223 228   BMET Recent Labs    10/28/20 0456 10/29/20 0421  NA 135 136  K 3.5 3.8  CL 108 109  CO2 22 24  GLUCOSE 197* 284*  BUN 22 25*  CREATININE 1.00 1.01  CALCIUM 7.1* 7.2*   PT/INR No results for input(s): LABPROT, INR in the last 72 hours. CMP     Component Value Date/Time   NA 136 10/29/2020 0421   K 3.8 10/29/2020 0421   CL 109 10/29/2020 0421   CO2 24 10/29/2020 0421   GLUCOSE 284 (H) 10/29/2020 0421   BUN 25 (H) 10/29/2020 0421   CREATININE 1.01 10/29/2020 0421   CALCIUM 7.2 (L) 10/29/2020 0421   PROT 5.3 (L) 10/28/2020 0456   ALBUMIN 1.7 (L) 10/28/2020 0456   AST 20 10/28/2020 0456   ALT 18 10/28/2020 0456   ALKPHOS 53 10/28/2020 0456   BILITOT 0.4 10/28/2020 0456   GFRNONAA >60 10/29/2020 0421   GFRAA >60  03/24/2017 0238   Lipase  No results found for: LIPASE     Studies/Results: No results found.  Anti-infectives: Anti-infectives (From admission, onward)    Start     Dose/Rate Route Frequency Ordered Stop   10/27/20 1400  ceFEPIme (MAXIPIME) 2 g in sodium chloride 0.9 % 100 mL IVPB        2 g 200 mL/hr over 30 Minutes Intravenous Every 8 hours 10/27/20 0927 10/27/20 2250   10/25/20 1400  ceFEPIme (MAXIPIME) 2 g in sodium chloride 0.9 % 100 mL IVPB  Status:  Discontinued        2 g 200 mL/hr over 30 Minutes Intravenous Every 12 hours 10/25/20 0729 10/27/20 0927   10/23/20 1000  ceFEPIme (MAXIPIME) 2 g in sodium chloride 0.9 % 100 mL IVPB  Status:  Discontinued        2 g 200 mL/hr over 30 Minutes Intravenous Every 8 hours 10/23/20 0833 10/25/20 0729   10/21/20 1000  ceFEPIme (MAXIPIME) 2 g in sodium chloride 0.9 % 100 mL IVPB  Status:  Discontinued        2 g 200 mL/hr over 30 Minutes Intravenous Every 12 hours 10/21/20 0742 10/23/20 0833   10/21/20 0600  metroNIDAZOLE (FLAGYL) IVPB 500 mg        500 mg 100 mL/hr over 60 Minutes Intravenous Every 8 hours 10/20/20  0028 10/28/20 0759   10/20/20 2200  ceFEPIme (MAXIPIME) 2 g in sodium chloride 0.9 % 100 mL IVPB  Status:  Discontinued        2 g 200 mL/hr over 30 Minutes Intravenous Every 24 hours 10/20/20 0048 10/21/20 0742   10/19/20 2030  ceFEPIme (MAXIPIME) 2 g in sodium chloride 0.9 % 100 mL IVPB        2 g 200 mL/hr over 30 Minutes Intravenous  Once 10/19/20 2029 10/20/20 0033   10/19/20 2030  metroNIDAZOLE (FLAGYL) IVPB 500 mg        500 mg 100 mL/hr over 60 Minutes Intravenous  Once 10/19/20 2029 10/19/20 2210        Assessment/Plan 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 - POD5 - tolerating soft diet - ostomy output came way down with soft diet, stop  fiber today and monitor  - pelvic drain is SS - surgical path pending - DC TPN and continue nutritional supplements - mobilize - WOC following  Severe protein calorie malnutrition - prealbumin 7.6, PO intake improving   FEN: SOFT diet, DC TPN, add ensure VTE: LMWH ID: [email or web address] 7/23>8/1; WBC back up to 17 from 15, check UA and CXR   T2DM HTN CKD stage III Intellectual disability residing at [facility]  LOS: 9 days    Norm Parcel, [facility] Surgery 10/29/2020, 8:46 AM Please see Amion for pager number during day hours 7:00am-4:30pm

## 2020-10-29 NOTE — Progress Notes (Signed)
PROGRESS NOTE    Mark Chang  [id number] DOB: December 22, 1945 DOA: 10/19/2020 PCP: Terrill Mohr, NP     Brief Narrative:  Mark Chang is a 75 year old male with past medical history significant for type 2 diabetes mellitus, essential hypertension, CKD stage IIIa, intellectual disability who resides at SNF who initially presented to [facility] ED with complaint of abdominal pain, diarrhea and was found to be hypotensive.  Per SNF staff, he reported abdominal pain for the last few weeks.     In the ED, he was found to have a blood pressure of 68/40, temperature 100.5 F, HR 105, lactic acid 2.2, WBC count 22.7.  CT abdomen/pelvis showed minimal amount of free air, cecal mass probably sigmoid colon with concern for perforated viscus.  Gastroenterology and general surgery were consulted.  Patient admitted for management of abdominal pain secondary to cecal mass with likely perforated viscus.  Patient was initially monitored with conservative management, could not get a hold of family or legal guardian.  However, patient continued to clinically decline and patient ultimately underwent right colectomy with anastomosis, left colectomy with end colostomy by [name] on 7/28.  Postoperatively, patient remained in respiratory acidosis and was left on mechanical ventilation, required pressors.  He was transferred to ICU.  He was extubated 7/29 and transferred back to [facility] service on 7/30.  TPN was started 7/31.   New events last 24 hours / Subjective: Patient without any complaints.  Continues to tolerate diet with functioning colostomy.  Assessment & Plan:   Principal Problem:   Perforated abdominal viscus Active Problems:   Acute respiratory failure (HCC)   Intellectual disability   AKI (acute kidney injury) (HCC)   Sepsis (HCC)   DM (diabetes mellitus), type 2 with renal complications (HCC)   CKD (chronic kidney disease), stage III (HCC)   Mass of cecum   Pneumatosis coli   Colon  obstruction (HCC)    Septic shock in the setting of perforated abdominal viscus -Status post right colectomy with anastomosis, left colectomy with end colostomy by [name] 7/28 -Hemodynamically improved -TPN initiated, weaning off today -Completed antibiotics -General surgery following -Continue to trend leukocytosis   Paroxysmal atrial fibrillation with RVR -Cardiology signed off 7/27 -Continue amiodarone, Coreg -In normal sinus rhythm with PVC today   AKI on CKD stage IIIa -Resolved   Essential hypertension -Continue amlodipine, Coreg, lisinopril    Type 2 diabetes mellitus -Semglee, sliding scale insulin.  Dose increased today    DVT prophylaxis:  enoxaparin (LOVENOX) injection 40 mg Start: 10/25/20 1400 Place and maintain sequential compression device Start: 10/22/20 0852 SCDs Start: 10/20/20 0026  Code Status:     Code Status Orders  (From admission, onward)           Start     Ordered   10/20/20 0026  Full code  Continuous        10/20/20 0028           Code Status History     Date Active Date Inactive Code Status Order ID Comments User Context   03/20/2017 2118 03/24/2017 2249 Full Code [id number]  Vianne Bulls, MD ED   07/18/2016 2100 07/23/2016 1735 Full Code [id number]  Luz Brazen, MD ED      Family Communication: No family at bedside Disposition Plan:  Status is: Inpatient  Remains inpatient appropriate because:IV treatments appropriate due to intensity of illness or inability to take PO and Inpatient level of care appropriate due to severity of illness  Dispo: The patient is from: SNF              Anticipated d/c is to: SNF              Patient currently is not medically stable to d/c.   Difficult to place patient No    Antimicrobials:  Anti-infectives (From admission, onward)    Start     Dose/Rate Route Frequency Ordered Stop   10/27/20 1400  ceFEPIme (MAXIPIME) 2 g in sodium chloride 0.9 % 100 mL IVPB        2  g 200 mL/hr over 30 Minutes Intravenous Every 8 hours 10/27/20 0927 10/27/20 2250   10/25/20 1400  ceFEPIme (MAXIPIME) 2 g in sodium chloride 0.9 % 100 mL IVPB  Status:  Discontinued        2 g 200 mL/hr over 30 Minutes Intravenous Every 12 hours 10/25/20 0729 10/27/20 0927   10/23/20 1000  ceFEPIme (MAXIPIME) 2 g in sodium chloride 0.9 % 100 mL IVPB  Status:  Discontinued        2 g 200 mL/hr over 30 Minutes Intravenous Every 8 hours 10/23/20 0833 10/25/20 0729   10/21/20 1000  ceFEPIme (MAXIPIME) 2 g in sodium chloride 0.9 % 100 mL IVPB  Status:  Discontinued        2 g 200 mL/hr over 30 Minutes Intravenous Every 12 hours 10/21/20 0742 10/23/20 0833   10/21/20 0600  metroNIDAZOLE (FLAGYL) IVPB 500 mg        500 mg 100 mL/hr over 60 Minutes Intravenous Every 8 hours 10/20/20 0028 10/28/20 0759   10/20/20 2200  ceFEPIme (MAXIPIME) 2 g in sodium chloride 0.9 % 100 mL IVPB  Status:  Discontinued        2 g 200 mL/hr over 30 Minutes Intravenous Every 24 hours 10/20/20 0048 10/21/20 0742   10/19/20 2030  ceFEPIme (MAXIPIME) 2 g in sodium chloride 0.9 % 100 mL IVPB        2 g 200 mL/hr over 30 Minutes Intravenous  Once 10/19/20 2029 10/20/20 0033   10/19/20 2030  metroNIDAZOLE (FLAGYL) IVPB 500 mg        500 mg 100 mL/hr over 60 Minutes Intravenous  Once 10/19/20 2029 10/19/20 2210        Objective: Vitals:   10/29/20 0800 10/29/20 0900 10/29/20 0925 10/29/20 1000  BP: (!) 157/58  (!) 142/52 (!) 150/48  Pulse: 92 (!) 102  (!) 117  Resp: (!) 31 (!) 32  (!) 25  Temp: 97.9 F (36.6 C)     TempSrc: Axillary     SpO2: 99% 100%  98%  Weight:      Height:        Intake/Output Summary (Last 24 hours) at 10/29/2020 1051 Last data filed at 10/29/2020 1038 Gross per 24 hour  Intake 3661.92 ml  Output 5180 ml  Net -1518.08 ml    Filed Weights   10/19/20 2019 10/26/20 0944 10/27/20 0600  Weight: 122 kg 113.2 kg 117.3 kg    Examination: General exam: Appears calm and comfortable   Respiratory system: Clear to auscultation. Respiratory effort normal. Cardiovascular system: S1 & S2 heard, RRR (NSR with PVC on tele). No pedal edema. Gastrointestinal system: Abdomen is nondistended, soft and nontender. Normal bowel sounds heard. +colostomy with stool  Central nervous system: Alert and oriented. Non focal exam. Speech clear  Extremities: Symmetric in appearance bilaterally  Skin: No rashes, lesions or ulcers on exposed skin  Psychiatry: Judgement  and insight appear stable. Mood & affect appropriate.    Data Reviewed: I have personally reviewed following labs and imaging studies  CBC: Recent Labs  Lab 10/25/20 0300 10/26/20 0431 10/27/20 0300 10/28/20 0456 10/29/20 0421  WBC 23.4* 19.1* 18.8* 15.4* 17.5*  NEUTROABS  --   --  14.3* 10.8*  --   HGB 10.0* 9.6* 8.7* 8.8* 8.9*  HCT 30.6* 29.3* 27.7* 27.7* 28.3*  MCV 86.7 88.3 88.5 89.6 90.7  PLT 414* 280 249 223 [id number]    Basic Metabolic Panel: Recent Labs  Lab 10/25/20 0300 10/26/20 0431 10/27/20 0300 10/28/20 0456 10/29/20 0421  NA 146* 149* 139 135 136  K 3.6 3.6 3.2* 3.5 3.8  CL 119* 120* 112* 108 109  CO2 22 21* 20* 22 24  GLUCOSE 158* 118* 160* 197* 284*  BUN 33* 30* 26* 22 25*  CREATININE 1.49* 1.20 1.11 1.00 1.01  CALCIUM 7.1* 7.5* 7.1* 7.1* 7.2*  MG 1.8 2.1 1.8 1.7 1.9  PHOS 1.9* 2.4* 1.5* 2.2* 2.3*    GFR: Estimated Creatinine Clearance: 77.4 mL/min (by C-G formula based on SCr of 1.01 mg/dL). Liver Function Tests: Recent Labs  Lab 10/24/20 0308 10/25/20 0300 10/26/20 0431 10/27/20 0300 10/28/20 0456  AST 35 23 70* 41 20  ALT '30 18 29 24 18  '$ ALKPHOS 82 48 53 60 53  BILITOT 0.5 0.8 0.5 0.5 0.4  PROT 6.3* 5.0* 5.3* 5.3* 5.3*  ALBUMIN 2.1* 2.0* 1.9* 1.7* 1.7*    No results for input(s): LIPASE, AMYLASE in the last 168 hours. No results for input(s): AMMONIA in the last 168 hours. Coagulation Profile: No results for input(s): INR, PROTIME in the last 168 hours.  Cardiac  Enzymes: No results for input(s): CKTOTAL, CKMB, CKMBINDEX, TROPONINI in the last 168 hours. BNP (last 3 results) No results for input(s): PROBNP in the last 8760 hours. HbA1C: No results for input(s): HGBA1C in the last 72 hours. CBG: Recent Labs  Lab 10/28/20 1243 10/28/20 1624 10/28/20 1943 10/28/20 2143 10/29/20 0727  GLUCAP 338* 293* 240* 244* 245*    Lipid Profile: Recent Labs    10/27/20 0300 10/28/20 0456  TRIG 162* 198*    Thyroid Function Tests: No results for input(s): TSH, T4TOTAL, FREET4, T3FREE, THYROIDAB in the last 72 hours. Anemia Panel: No results for input(s): VITAMINB12, FOLATE, FERRITIN, TIBC, IRON, RETICCTPCT in the last 72 hours. Sepsis Labs: Recent Labs  Lab 10/24/20 1609  LATICACIDVEN 1.5     Recent Results (from the past 240 hour(s))  Resp Panel by RT-PCR (Flu A&B, Covid) Nasopharyngeal Swab     Status: None   Collection Time: 10/19/20  8:29 PM   Specimen: Nasopharyngeal Swab; Nasopharyngeal(NP) swabs in vial transport medium  Result Value Ref Range Status   SARS Coronavirus 2 by RT PCR NEGATIVE NEGATIVE Final    Comment: (NOTE) SARS-CoV-2 target nucleic acids are NOT DETECTED.  The SARS-CoV-2 RNA is generally detectable in upper respiratory specimens during the acute phase of infection. The lowest concentration of SARS-CoV-2 viral copies this assay can detect is 138 copies/mL. A negative result does not preclude SARS-Cov-2 infection and should not be used as the sole basis for treatment or other patient management decisions. A negative result may occur with  improper specimen collection/handling, submission of specimen other than nasopharyngeal swab, presence of viral mutation(s) within the areas targeted by this assay, and inadequate number of viral copies(<138 copies/mL). A negative result must be combined with clinical observations, patient history, and epidemiological information. The  expected result is Negative.  Fact Sheet  for Patients:  EntrepreneurPulse.com.au  Fact Sheet for Healthcare Providers:  IncredibleEmployment.be  This test is no t yet approved or cleared by the [place] FDA and  has been authorized for detection and/or diagnosis of SARS-CoV-2 by FDA under an Emergency Use Authorization (EUA). This EUA will remain  in effect (meaning this test can be used) for the duration of the COVID-19 declaration under Section 564(b)(1) of the Act, 21 U.S.C.section 360bbb-3(b)(1), unless the authorization is terminated  or revoked sooner.       Influenza A by PCR NEGATIVE NEGATIVE Final   Influenza B by PCR NEGATIVE NEGATIVE Final    Comment: (NOTE) The Xpert Xpress SARS-CoV-2/FLU/RSV plus assay is intended as an aid in the diagnosis of influenza from Nasopharyngeal swab specimens and should not be used as a sole basis for treatment. Nasal washings and aspirates are unacceptable for Xpert Xpress SARS-CoV-2/FLU/RSV testing.  Fact Sheet for Patients: EntrepreneurPulse.com.au  Fact Sheet for Healthcare Providers: IncredibleEmployment.be  This test is not yet approved or cleared by the [place] FDA and has been authorized for detection and/or diagnosis of SARS-CoV-2 by FDA under an Emergency Use Authorization (EUA). This EUA will remain in effect (meaning this test can be used) for the duration of the COVID-19 declaration under Section 564(b)(1) of the Act, 21 U.S.C. section 360bbb-3(b)(1), unless the authorization is terminated or revoked.  Performed at [facility], [place] [address] [place], [place] [address]   Blood Culture (routine x 2)     Status: None   Collection Time: 10/19/20  8:29 PM   Specimen: BLOOD  Result Value Ref Range Status   Specimen Description   Final    BLOOD BLOOD LEFT ARM Performed at [facility] [address], [place], [place] [address]    Special  Requests   Final    BOTTLES DRAWN AEROBIC AND ANAEROBIC Blood Culture adequate volume Performed at [facility] [place] [address], [place], [place] [address]    Culture   Final    NO GROWTH 5 DAYS Performed at [facility], [place] [address] [place], [place] [address]    Report Status 10/24/2020 FINAL  Final  Urine Culture     Status: None   Collection Time: 10/19/20  8:29 PM   Specimen: In/Out Cath Urine  Result Value Ref Range Status   Specimen Description   Final    IN/OUT CATH URINE Performed at [facility] [address], [place], [place] [address]    Special Requests   Final    NONE Performed at [facility], [place] [address] [place], [place] [address]    Culture   Final    NO GROWTH Performed at [facility], [place] [address], [place], [place] [address]    Report Status 10/21/2020 FINAL  Final  Blood Culture (routine x 2)     Status: None   Collection Time: 10/20/20  4:00 AM   Specimen: BLOOD  Result Value Ref Range Status   Specimen Description   Final    BLOOD BLOOD RIGHT ARM Performed at [facility] [address] [place], [place] [address]    Special Requests   Final    BOTTLES DRAWN AEROBIC AND ANAEROBIC Blood Culture adequate volume Performed at [facility] [address], [place],  [address]    Culture   Final    NO GROWTH  6 DAYS Performed at [facility], [place] [address] [place], [place] [address]    Report Status 10/26/2020 FINAL  Final  MRSA Next Gen by PCR, Nasal     Status: None   Collection Time: 10/20/20  7:35 AM   Specimen: Nasal Mucosa; Nasal Swab  Result Value Ref Range Status   MRSA by PCR Next Gen NOT DETECTED NOT DETECTED Final    Comment: (NOTE) The GeneXpert MRSA Assay (FDA approved for NASAL specimens only), is one component of a comprehensive MRSA colonization surveillance program. It is not intended to diagnose MRSA  infection nor to guide or monitor treatment for MRSA infections. Test performance is not FDA approved in patients less than 44 years old. Performed at [facility], [place] [address], [place], [place] [address]   C Difficile Quick Screen w PCR reflex     Status: None   Collection Time: 10/20/20  4:58 PM   Specimen: STOOL  Result Value Ref Range Status   C Diff antigen NEGATIVE NEGATIVE Final   C Diff toxin NEGATIVE NEGATIVE Final   C Diff interpretation No C. difficile detected.  Final    Comment: Performed at [facility], [place] [address] [place], [place] [address]  Culture, blood (routine x 2)     Status: None (Preliminary result)   Collection Time: 10/24/20  7:08 PM   Specimen: BLOOD  Result Value Ref Range Status   Specimen Description   Final    BLOOD RIGHT ANTECUBITAL Performed at [place] [address] [place], [place] [address]    Special Requests   Final    BOTTLES DRAWN AEROBIC AND ANAEROBIC Blood Culture adequate volume Performed at [facility] [address] [place], [place] [address]    Culture   Final    NO GROWTH 4 DAYS Performed at [facility], [place] [address], [place], [place] [address]    Report Status PENDING  Incomplete  Culture, blood (routine x 2)     Status: None (Preliminary result)   Collection Time: 10/24/20  7:08 PM   Specimen: BLOOD  Result Value Ref Range Status   Specimen Description   Final    BLOOD LEFT ANTECUBITAL Performed at [facility] [place] [address] [place], [place] [address]    Special Requests   Final    BOTTLES DRAWN AEROBIC AND ANAEROBIC Blood Culture results may not be optimal due to an inadequate volume of blood received in culture bottles Performed at [facility] [address] [place], [place] [address]    Culture   Final    NO GROWTH 4 DAYS Performed at [facility], [place] [address],  [place],  [address]    Report Status PENDING  Incomplete       Radiology Studies: No results found.    Scheduled Meds:  amiodarone  200 mg Oral BID   carvedilol  6.25 mg Oral BID WC   Chlorhexidine Gluconate Cloth  6 each Topical Daily   docusate sodium  100 mg Oral BID   enoxaparin (LOVENOX) injection  40 mg Subcutaneous Q24H   feeding supplement  237 mL Oral TID BM   insulin aspart  0-20 Units Subcutaneous TID WC   insulin aspart  0-5 Units Subcutaneous QHS   insulin aspart  5 Units Subcutaneous TID WC   insulin glargine-yfgn  40 Units Subcutaneous QHS   lisinopril  40 mg Oral Daily  mouth rinse  15 mL Mouth Rinse BID   pneumococcal 23 valent vaccine  0.5 mL Intramuscular Tomorrow-1000   sodium chloride flush  10-40 mL Intracatheter Q12H   sodium chloride flush  10-40 mL Intracatheter Q12H   Continuous Infusions:  sodium chloride 10 mL/hr at 10/29/20 [id number]   potassium PHOSPHATE IVPB (in mmol) 20 mmol (10/29/20 0925)     LOS: 9 days      Time spent: 25 minutes   Dessa Phi, DO [facility] 10/29/2020, 10:51 AM   Available via Epic secure chat 7am-7pm After these hours, please refer to coverage provider listed on [email or web address]

## 2020-10-29 NOTE — Progress Notes (Signed)
Occupational Therapy Treatment Patient Details Name: Mark Chang MRN: [id number] DOB: 04-06-1945 Today's Date: 10/29/2020    History of present illness 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 Patient developed free air and required a right colectomy with anastomosis and left colectomy with end colostomy on 10/24/2020.   OT comments  Patient agreeable to getting up to chair this session. Patient initiates LE movement to edge of bed, reports soreness in knees and needing mod x2 with use of bed pad to sit upright. Patient with R lean and reliant on arms for balance support. Patient needing two attempts and max A x2 to power up to standing from edge of bed. Use of stedy to transfer patient to recliner chair, with verbal cues for body mechanics. Placed hoyer pad in patient chair and informed RN to use lift back to bed. VSS on room air during session.    Follow Up Recommendations  SNF    Equipment Recommendations  None recommended by OT       Precautions / Restrictions Precautions Precautions: Other (comment) Precaution Comments: abdominal incision, colostomy, JP drain, heavy R lean       Mobility Bed Mobility Overal bed mobility: Needs Assistance Bed Mobility: Supine to Sit     Supine to sit: Mod assist;+2 for physical assistance;+2 for safety/equipment;HOB elevated     General bed mobility comments: patient attempts to assist moving legs towards edge of bed and using upper extremities to push buttock forward toward edge of bed, mod A x2 with use of bed pad to help pivot hips    Transfers Overall transfer level: Needs assistance   Transfers: Sit to/from Stand;Stand Pivot Transfers Sit to Stand: Max assist;+2 physical assistance;+2 safety/equipment Stand pivot transfers: Total assist       General transfer comment: needing to attempts to complete standing from  edge of bed and clear buttock enough to palce sara stedy paddles beneath buttock. strong R lean needing cues throughout for hand placement and try to correct posture    Balance Overall balance assessment: Needs assistance Sitting-balance support: Bilateral upper extremity supported Sitting balance-Leahy Scale: Poor   Postural control: Right lateral lean Standing balance support: Bilateral upper extremity supported;During functional activity Standing balance-Leahy Scale: Zero Standing balance comment: unable to stand without max A x2, use of stedy to chair                           ADL either performed or assessed with clinical judgement   ADL Overall ADL's : Needs assistance/impaired                     Lower Body Dressing: Total assistance;Bed level Lower Body Dressing Details (indicate cue type and reason): to don socks Toilet Transfer: Total assistance;+2 for physical assistance;+2 for safety/equipment Toilet Transfer Details (indicate cue type and reason): patient needing max A x2 to power up to standing from edge of bed. use sara stedy to transfer patient from bed to recliner chair with strong R lean. needs cues for sequencing and hand placement                  Cognition Arousal/Alertness: Awake/alert Behavior During Therapy: WFL for tasks assessed/performed Overall Cognitive Status: History of cognitive impairments - at baseline  Pertinent Vitals/ Pain       Pain Assessment: Faces Faces Pain Scale: Hurts little more Pain Location: knees with bed mobility Pain Descriptors / Indicators: Sore Pain Intervention(s): Monitored during session         Frequency  Min 2X/week        Progress Toward Goals  OT Goals(current goals can now be found in the care plan section)  Progress towards OT goals: Progressing toward goals  Acute Rehab OT Goals Patient Stated Goal: Get  up to chair OT Goal Formulation: With patient Time For Goal Achievement: 11/09/20 Potential to Achieve Goals: Good ADL Goals Pt Will Perform Upper Body Bathing: with set-up;sitting Pt Will Perform Upper Body Dressing: with set-up;sitting Pt Will Perform Lower Body Dressing: with mod assist;sit to/from stand;with adaptive equipment Pt Will Transfer to Toilet: with min assist;stand pivot transfer;bedside commode Pt Will Perform Toileting - Clothing Manipulation and hygiene: with min guard assist Additional ADL Goal #1: Patient will stand without prominant right lateral lean x 1 min with use of RW. Additional ADL Goal #2: Patient will stand at sink to perform grooming task as evidence of improving activity tolerance  Plan Discharge plan remains appropriate       AM-PAC OT "6 Clicks" Daily Activity     Outcome Measure   Help from another person eating meals?: A Little Help from another person taking care of personal grooming?: A Little Help from another person toileting, which includes using toliet, bedpan, or urinal?: Total Help from another person bathing (including washing, rinsing, drying)?: A Lot Help from another person to put on and taking off regular upper body clothing?: A Lot Help from another person to put on and taking off regular lower body clothing?: Total 6 Click Score: 12    End of Session Equipment Utilized During Treatment: Gait belt  OT Visit Diagnosis: Unsteadiness on feet (R26.81);Muscle weakness (generalized) (M62.81)   Activity Tolerance Patient tolerated treatment well   Patient Left in chair;with call bell/phone within reach   Nurse Communication Need for lift equipment (hoyer pad placed beneath patient)        Time: [id number] OT Time Calculation (min): 27 min  Charges: OT General Charges $OT Visit: 1 Visit OT Treatments $Self Care/Home Management : 23-37 mins  Mark Chang OT OT pager: [phone]  Mark Chang 10/29/2020, 12:17 PM

## 2020-10-29 NOTE — Progress Notes (Signed)
PHARMACY - TOTAL PARENTERAL NUTRITION CONSULT NOTE   Indication: Prolonged ileus  Patient Measurements: Height: 5' 7.99" (172.7 cm) Weight: 117.3 kg (258 lb 9.6 oz) IBW/kg (Calculated) : 68.38 TPN AdjBW (KG): 80.6 Body mass index is 39.33 kg/m.  Assessment: 75 yo male s/p right colectomy with anastomosis and left colectomy with end colostomy on 7/28 now to start TPN per [facility] orders for prolonged ileus  Glucose / Insulin: CBGs <150 prior to start of TPN, note patient with DM on Lantus 40 units daily and 10 units qhs prior to admission along with Novolog 8 units TID meal coverage, SSI and Trulicity once weekly - CBGs now > 200, Semglee increased 35 units qhs on 7/31, 28 units SSI given in past 24hr Electrolytes: Na 136 stable, Cl 109 stable, phos slightly improved 2.3 but still low after replacement 7/31 and 8/1, K 3.8 improved, CO2 24 up with max acetate in TPN, mag 1.9 improved after supplement 7/31 and 8/1, Corr Ca 9.04 WNL Renal: CKDIIIb, SCr 1.01 stable, BUN 25 improved, UOP 4.3L/24hr Hepatic: AST/ALT normalized, TG (7/31) 162 - increased 198 (8/1) Alb/Prealbumin: alb 1.7, prealbumin 7.6 Intake / Output; MIVF: 1.12L colostomy/stool output last 24hr, 615 mL drain output (increased), 4.3L UOP, IVF stopped 7/31 GI Imaging: GI Surgeries / Procedures: see assessment above  Central access: 10/24/20 arterial line, 7/31 triple lumen PICC TPN start date: 10/26/20  Nutritional Goals (per RD recommendation on 10/26/20): kCal: 2300-2500, Protein: 105-120, Fluid: 2L/day  Goal TPN rate is 90 mL/hr (provides 119 g of protein and 2225 kcals per day)  Current Nutrition:  Soft diet and add Ensure TID 8/1 TPN  Plan:  This AM: Mg Sulfate 2g IV, Kphos 24mol IV x 1 (provides ~30 mEq K+)  Ok to dc TPN per [facility]CS note Resistant ACHS SSI, Lantus 35 units qhs per TRH - consider resume home regimen (Lantus 40 units qAM, 10 units qHS) now that taking diet   EPeggyann Juba PharmD, BCPS Pharmacy:  [phone] 51088/04/2020,7:38 AM

## 2020-10-30 ENCOUNTER — Inpatient Hospital Stay (HOSPITAL_COMMUNITY): Payer: Medicare HMO

## 2020-10-30 DIAGNOSIS — R198 Other specified symptoms and signs involving the digestive system and abdomen: Secondary | ICD-10-CM | POA: Diagnosis not present

## 2020-10-30 DIAGNOSIS — K56609 Unspecified intestinal obstruction, unspecified as to partial versus complete obstruction: Secondary | ICD-10-CM | POA: Diagnosis not present

## 2020-10-30 LAB — GLUCOSE, CAPILLARY
Glucose-Capillary: 191 mg/dL — ABNORMAL HIGH (ref 70–99)
Glucose-Capillary: 128 mg/dL — ABNORMAL HIGH (ref 70–99)
Glucose-Capillary: 126 mg/dL — ABNORMAL HIGH (ref 70–99)
Glucose-Capillary: 132 mg/dL — ABNORMAL HIGH (ref 70–99)

## 2020-10-30 LAB — CBC
HCT: 27 % — ABNORMAL LOW (ref 39.0–52.0)
Hemoglobin: 8.4 g/dL — ABNORMAL LOW (ref 13.0–17.0)
MCH: 28.4 pg (ref 26.0–34.0)
MCHC: 31.1 g/dL (ref 30.0–36.0)
MCV: 91.2 fL (ref 80.0–100.0)
Platelets: 287 10*3/uL (ref 150–400)
RBC: 2.96 MIL/uL — ABNORMAL LOW (ref 4.22–5.81)
RDW: 16 % — ABNORMAL HIGH (ref 11.5–15.5)
WBC: 21.6 10*3/uL — ABNORMAL HIGH (ref 4.0–10.5)
nRBC: 0.2 % (ref 0.0–0.2)

## 2020-10-30 LAB — CULTURE, BLOOD (ROUTINE X 2)
Culture: NO GROWTH
Culture: NO GROWTH
Special Requests: ADEQUATE

## 2020-10-30 LAB — BASIC METABOLIC PANEL
Anion gap: 5 (ref 5–15)
BUN: 26 mg/dL — ABNORMAL HIGH (ref 8–23)
CO2: 24 mmol/L (ref 22–32)
Calcium: 7.5 mg/dL — ABNORMAL LOW (ref 8.9–10.3)
Chloride: 107 mmol/L (ref 98–111)
Creatinine, Ser: 1.06 mg/dL (ref 0.61–1.24)
GFR, Estimated: >60 mL/min (ref >60)
Glucose, Bld: 253 mg/dL — ABNORMAL HIGH (ref 70–99)
Potassium: 3.9 mmol/L (ref 3.5–5.1)
Sodium: 136 mmol/L (ref 135–145)

## 2020-10-30 MED ORDER — CALCIUM POLYCARBOPHIL 625 MG PO TABS
625.0000 mg | ORAL_TABLET | Freq: Two times a day (BID) | ORAL | Status: DC
  Administered 2020-10-30 – 2020-11-06 (×13): 625 mg via ORAL
  Filled 2020-10-30 (×16): qty 1

## 2020-10-30 MED ORDER — IOHEXOL 9 MG/ML PO SOLN
500.0000 mL | ORAL | Status: AC
  Administered 2020-10-30: 500 mL via ORAL

## 2020-10-30 MED ORDER — IOHEXOL 9 MG/ML PO SOLN
ORAL | Status: AC
  Administered 2020-10-30: 500 mL via ORAL
  Filled 2020-10-30: qty 1000

## 2020-10-30 MED ORDER — IOHEXOL 350 MG/ML SOLN
100.0000 mL | Freq: Once | INTRAVENOUS | Status: AC | PRN
  Administered 2020-10-30: 100 mL via INTRAVENOUS

## 2020-10-30 NOTE — Assessment & Plan Note (Addendum)
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 - discussed with oncology; he can have outpatient followup once healed from surgery; CT chest ordered 8/6 to complete staging  - patient has IDD and unable to contact brother or POA; SW/CM assisting with learning who can help patient make medical decisions on next steps too

## 2020-10-30 NOTE — TOC Initial Note (Signed)
Transition of Care [facility]) - Initial/Assessment Note    Patient Details  Name: Mark Chang MRN: [id number] Date of Birth: 02-08-1946  Transition [facility] Care [facility]) CM/SW Contact:    Trish Mage, LCSW Phone Number: 10/30/2020, 8:36 AM  Clinical Narrative:   Patient with IDD is in need of SNF for rehab according to PT.  Spoke with Ms Ethelene Browns at [facility], who confirms he has been a resident there for 8 years or so, and is welcome to return there once he has been to rehab.  Brother has disappeared from patient's life-they have not been able to contact him for years.  Explained to patient about the plan.  Bed search initiated. TOC will continue to follow during the course of hospitalization.                 Expected Discharge Plan: Skilled Nursing Facility Barriers to Discharge: SNF Pending bed offer   Patient Goals and CMS Choice Patient states their goals for this hospitalization and ongoing recovery are:: to go home CMS Medicare.gov Compare Post Acute Care list provided to:: Legal Guardian Choice offered to / list presented to : [facility] POA / Guardian  Expected Discharge Plan and Services Expected Discharge Plan: [place]   Discharge Planning Services: CM Consult Post Acute Care Choice: [place] Living arrangements for the past 2 months: [place]                                      Prior Living Arrangements/Services Living arrangements for the past 2 months: [place] Lives with:: Facility Resident Patient language and need for interpreter reviewed:: Yes Do you feel safe going back to the place where you live?: Yes      Need for Family Participation in Patient Care: No (Comment) Care giver support system in place?: Yes (comment)   Criminal Activity/Legal Involvement Pertinent to Current Situation/Hospitalization: No - Comment as needed  Activities of Daily Living Home Assistive Devices/Equipment: Environmental consultant  (specify type), Wheelchair ADL Screening (condition at time of admission) Patient's cognitive ability adequate to safely complete daily activities?: No Is the patient deaf or have difficulty hearing?: No Does the patient have difficulty seeing, even when wearing glasses/contacts?: No Does the patient have difficulty concentrating, remembering, or making decisions?: No Patient able to express need for assistance with ADLs?: Yes Does the patient have difficulty dressing or bathing?: Yes Independently performs ADLs?: No Communication: Needs assistance Is this a change from baseline?: Pre-admission baseline Dressing (OT): Needs assistance Is this a change from baseline?: Pre-admission baseline Grooming: Needs assistance Is this a change from baseline?: Pre-admission baseline Feeding: Needs assistance Is this a change from baseline?: Pre-admission baseline Bathing: Dependent Is this a change from baseline?: Pre-admission baseline Toileting: Needs assistance, Dependent Is this a change from baseline?: Pre-admission baseline In/Out Bed: Dependent Is this a change from baseline?: Pre-admission baseline Does the patient have difficulty walking or climbing stairs?: Yes Weakness of Legs: Both Weakness of Arms/Hands: Both  Permission Sought/Granted      Share Information with NAME: [facility]           Emotional Assessment Appearance:: Appears stated age     Orientation: : Oriented to Self, Oriented to Situation Alcohol / Substance Use: Not Applicable Psych Involvement: No (comment)  Admission diagnosis:  Colitis [K52.9] AKI (acute kidney injury) ([place]) [N17.9] Pneumatosis intestinalis [K63.89] Perforated abdominal  viscus [R19.8] Intestinal obstruction, unspecified cause, unspecified whether partial or complete ([place]) [K56.609] Sepsis, due to unspecified organism, unspecified whether acute organ dysfunction present [facility]) [A41.9] Patient Active Problem List   Diagnosis Date  Noted   Colon obstruction ([place])    Perforated abdominal viscus 10/20/2020   Mass of cecum 10/20/2020   Pneumatosis coli 10/20/2020   Sepsis due to Escherichia coli (E. coli) ([place]) 03/24/2017   E. coli UTI 03/24/2017   Obesity, Class III, BMI 40-49.9 (morbid obesity) ([place]) 03/24/2017   DM (diabetes mellitus), type 2 with renal complications ([place]) 123XX123   CKD (chronic kidney disease), stage III ([place]) 03/24/2017   Intellectual disability 03/20/2017   Hypertension 03/20/2017   AKI (acute kidney injury) ([place]) 03/20/2017   Pernicious anemia 03/20/2017   Sepsis ([place]) 03/20/2017   Acute respiratory failure ([place])    PCP:  Terrill Mohr, NP Pharmacy:   [facility], [place] [address] Phone: [phone] Fax: [place], [place] [address] Phone: [phone] Fax: [phone]     Social Determinants of Health (SDOH) Interventions    Readmission Risk Interventions No flowsheet data found.

## 2020-10-30 NOTE — Plan of Care (Signed)

## 2020-10-30 NOTE — Progress Notes (Signed)
Physical Therapy Treatment Patient Details Name: KAIYAN LAING MRN: [id number] DOB: 1945-04-07 Today's Date: 10/30/2020    History of Present Illness Mr. Parkhouse is a 75 year old male with a cognitive disability who presented with large bowel obstruction due to 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 Patient developed free air and required a right colectomy with anastomosis and left colectomy with end colostomy on 10/24/2020.    PT Comments    Assisted OOB to recliner was difficult.  General bed mobility comments: pt required + 2 MAX Assist to transition to EOB.  HOB elevated as well as using bed pad to complete scooting.  Mod RIGHT lean and difficulty self achieving upright midline sitting balance due to increased c/o ABD pain.General transfer comment: attempted sit to stand from elevated surface + 2 side by side assist and use of walker however pt was unable to clear hips off bed.  So place MAXI MOVE pad under pt as he sat and used LIFT to transfer to recliner.General Gait Details: unable at this time due to poor sit to stand ability Positioned in recliner to comfort.   Follow Up Recommendations  SNF     Equipment Recommendations  None recommended by PT    Recommendations for Other Services       Precautions / Restrictions Precautions Precautions: Other (comment) Precaution Comments: abdominal incision, colostomy, JP drain, heavy R lean Restrictions Weight Bearing Restrictions: No    Mobility  Bed Mobility Overal bed mobility: Needs Assistance Bed Mobility: Supine to Sit     Supine to sit: Max assist;+2 for physical assistance;+2 for safety/equipment     General bed mobility comments: pt required + 2 MAX Assist to transition to EOB.  HOB elevated as well as using bed pad to complete scooting.  Mod RIGHT lean and difficulty self achieving upright midline sitting balance due to increased c/o ABD pain.    Transfers Overall transfer level: Needs assistance Equipment used:  Rolling walker (2 wheeled) Transfers: Sit to/from Stand           General transfer comment: attempted sit to stand from elevated surface + 2 side by side assist and use of walker however pt was unable to clear hips off bed.  So place MAXI MOVE pad under pt as he sat and used LIFT to transfer to recliner.  Ambulation/Gait             General Gait Details: unable at this time due to poor sit to stand ability   Stairs             Wheelchair Mobility    Modified Rankin (Stroke Patients Only)       Balance                                            Cognition Arousal/Alertness: Awake/alert Behavior During Therapy: WFL for tasks assessed/performed Overall Cognitive Status: History of cognitive impairments - at baseline                                 General Comments: AxO x 1.5 flat effect but able to express needs and have some conversation.      Exercises      General Comments        Pertinent Vitals/Pain Pain Assessment: Faces Faces Pain Scale: Hurts  a little bit Pain Location: ABD with activity Pain Descriptors / Indicators: Discomfort;Grimacing;Tender Pain Intervention(s): Monitored during session    Home Living                      Prior Function            PT Goals (current goals can now be found in the care plan section) Progress towards PT goals: Progressing toward goals    Frequency    Min 2X/week      PT Plan Current plan remains appropriate    Co-evaluation              AM-PAC PT "6 Clicks" Mobility   Outcome Measure  Help needed turning from your back to your side while in a flat bed without using bedrails?: A Lot Help needed moving from lying on your back to sitting on the side of a flat bed without using bedrails?: A Lot Help needed moving to and from a bed to a chair (including a wheelchair)?: A Lot Help needed standing up from a chair using your arms (e.g., wheelchair or  bedside chair)?: Total Help needed to walk in hospital room?: Total Help needed climbing 3-5 steps with a railing? : Total 6 Click Score: 9    End of Session Equipment Utilized During Treatment: Gait belt Activity Tolerance: Patient limited by fatigue;Patient limited by pain Patient left: in chair;with call bell/phone within reach;with chair alarm set Nurse Communication: Mobility status;Need for lift equipment PT Visit Diagnosis: Unsteadiness on feet (R26.81);Muscle weakness (generalized) (M62.81);Difficulty in walking, not elsewhere classified (R26.2)     Time: [id number] PT Time Calculation (min) (ACUTE ONLY): 26 min  Charges:  $Therapeutic Activity: 23-37 mins                     {Monchel Pollitt  PTA Acute  Rehabilitation Services Pager      [phone] Office      [phone]

## 2020-10-30 NOTE — NC FL2 (Signed)
[place] LEVEL OF CARE SCREENING TOOL     IDENTIFICATION  Patient Name: Mark Chang Birthdate: 1946/01/20 Sex: male Admission Date (Current Location): 10/19/2020  [facility] and [place] Number:  Herbalist and Address:  [facility]  [place] [address], [place]      Provider Number: [id number]  Attending Physician Name and Address:  Dwyane Dee, MD  Relative Name and Phone Number:  [facility]    Current Level of Care: Hospital Recommended Level of Care: [place] Prior Approval Number:    Date Approved/Denied:   PASRR Number:    Discharge Plan: SNF    Current Diagnoses: Patient Active Problem List   Diagnosis Date Noted   Colon obstruction ([place])    Perforated abdominal viscus 10/20/2020   Mass of cecum 10/20/2020   Pneumatosis coli 10/20/2020   Sepsis due to Escherichia coli (E. coli) ([place]) 03/24/2017   E. coli UTI 03/24/2017   Obesity, Class III, BMI 40-49.9 (morbid obesity) ([place]) 03/24/2017   DM (diabetes mellitus), type 2 with renal complications ([place]) 123XX123   CKD (chronic kidney disease), stage III ([place]) 03/24/2017   Intellectual disability 03/20/2017   Hypertension 03/20/2017   AKI (acute kidney injury) ([place]) 03/20/2017   Pernicious anemia 03/20/2017   Sepsis ([place]) 03/20/2017   Acute respiratory failure (HCC)     Orientation RESPIRATION BLADDER Height & Weight     Self, Situation  Normal External catheter Weight: 117.3 kg Height:  5' 7.99" (172.7 cm)  BEHAVIORAL SYMPTOMS/MOOD NEUROLOGICAL BOWEL NUTRITION STATUS   (none)  (none) Colostomy Diet (see d/c summary)  AMBULATORY STATUS COMMUNICATION OF NEEDS Skin   Extensive Assist Verbally Other (Comment), Surgical wounds (JP drain RLQ)                       Personal Care Assistance Level of Assistance  Bathing, Feeding, Dressing Bathing Assistance: Maximum assistance Feeding assistance: Independent Dressing Assistance:  Limited assistance     Functional Limitations Info  Sight, Hearing, Speech Sight Info: Adequate Hearing Info: Adequate Speech Info: Adequate    SPECIAL CARE FACTORS FREQUENCY  PT (By licensed PT), OT (By licensed OT)     PT Frequency: 5X/W OT Frequency: 5X/W            Contractures Contractures Info: Not present    Additional Factors Info  Code Status, Allergies Code Status Info: full Allergies Info: NKA           Current Medications (10/30/2020):  This is the current hospital active medication list Current Facility-Administered Medications  Medication Dose Route Frequency Provider Last Rate Last Admin   (feeding supplement) PROSource Plus liquid 30 mL  30 mL Oral BID BM Dessa Phi, DO   30 mL at 10/29/20 1314   0.9 %  sodium chloride infusion   Intravenous Continuous Adrian Saran, RPH 10 mL/hr at 10/29/20 1220 Infusion Verify at 10/29/20 1220   amiodarone (PACERONE) tablet 200 mg  200 mg Oral BID Dessa Phi, DO   200 mg at 10/29/20 2150   amLODipine (NORVASC) tablet 5 mg  5 mg Oral Daily Dessa Phi, DO   5 mg at 10/29/20 1215   carvedilol (COREG) tablet 6.25 mg  6.25 mg Oral BID WC Dessa Phi, DO   6.25 mg at 10/29/20 1835   Chlorhexidine Gluconate Cloth 2 % PADS 6 each  6 each Topical Daily Earnstine Regal, PA-C   6 each at 10/29/20 [id number]  docusate sodium (COLACE) capsule 100 mg  100 mg Oral BID Dessa Phi, DO   100 mg at 10/29/20 2151   doxylamine (Sleep) (UNISOM) tablet 25 mg  25 mg Oral QHS PRN Lang Snow, NP   25 mg at 10/27/20 2203   enoxaparin (LOVENOX) injection 40 mg  40 mg Subcutaneous Q24H Arlyn Dunning M, RPH   40 mg at 10/29/20 1314   feeding supplement (ENSURE ENLIVE / ENSURE PLUS) liquid 237 mL  237 mL Oral TID BM Norm Parcel, PA-C   237 mL at 10/29/20 2008   fentaNYL (SUBLIMAZE) injection 25-100 mcg  25-100 mcg Intravenous Q30 min PRN Gleason, Otilio Carpen, PA-C   100 mcg at 10/27/20 2001   hydrALAZINE (APRESOLINE)  injection 10 mg  10 mg Intravenous Q4H PRN Dessa Phi, DO   10 mg at 10/28/20 1821   insulin aspart (novoLOG) injection 0-20 Units  0-20 Units Subcutaneous TID WC Emiliano Dyer, RPH   11 Units at 10/29/20 1835   insulin aspart (novoLOG) injection 0-5 Units  0-5 Units Subcutaneous QHS Emiliano Dyer, [facility]   4 Units at 10/29/20 2151   insulin aspart (novoLOG) injection 5 Units  5 Units Subcutaneous TID WC Dessa Phi, DO   5 Units at 10/29/20 1835   insulin glargine-yfgn (SEMGLEE) injection 40 Units  40 Units Subcutaneous QHS Dessa Phi, DO   40 Units at 10/29/20 2153   iohexol (OMNIPAQUE) 9 MG/ML oral solution 500 mL  500 mL Oral Q1H Barkley Boards R, PA-C   500 mL at 10/30/20 0759   lisinopril (ZESTRIL) tablet 40 mg  40 mg Oral Daily Dessa Phi, DO   40 mg at 10/29/20 [id number]   MEDLINE mouth rinse  15 mL Mouth Rinse BID Brand Males, MD   15 mL at 10/29/20 2154   metoprolol tartrate (LOPRESSOR) injection 5 mg  5 mg Intravenous Q6H PRN Dessa Phi, DO       multivitamin with minerals tablet 1 tablet  1 tablet Oral Daily Dessa Phi, DO   1 tablet at 10/29/20 1214   ondansetron (ZOFRAN) injection 4 mg  4 mg Intravenous Q6H PRN Leodis Sias T, RPH       oxyCODONE (Oxy IR/ROXICODONE) immediate release tablet 5 mg  5 mg Oral Q4H PRN Dessa Phi, DO   5 mg at 10/28/20 2138   pneumococcal 23 valent vaccine (PNEUMOVAX-23) injection 0.5 mL  0.5 mL Intramuscular Tomorrow-1000 Shawna Clamp, MD       polyethylene glycol (MIRALAX / GLYCOLAX) packet 17 g  17 g Oral Daily PRN Norm Parcel, PA-C       polyvinyl alcohol (LIQUIFILM TEARS) 1.4 % ophthalmic solution 1 drop  1 drop Both Eyes PRN Dessa Phi, DO   1 drop at 10/29/20 [id number]   sodium chloride flush (NS) 0.9 % injection 10-40 mL  10-40 mL Intracatheter Q12H Dessa Phi, DO   10 mL at 10/28/20 2138   sodium chloride flush (NS) 0.9 % injection 10-40 mL  10-40 mL Intracatheter PRN Dessa Phi, DO       sodium  chloride flush (NS) 0.9 % injection 10-40 mL  10-40 mL Intracatheter Q12H Dessa Phi, DO   10 mL at 10/29/20 2152   sodium chloride flush (NS) 0.9 % injection 10-40 mL  10-40 mL Intracatheter PRN Dessa Phi, DO   10 mL at 10/28/20 1728     Discharge Medications: Please see discharge summary for a list of discharge medications.  Relevant Imaging Results:  Relevant Lab Results:   Additional Information SSN: [place], [place]

## 2020-10-30 NOTE — Assessment & Plan Note (Signed)
-   continue SSI and CBG monitoring  

## 2020-10-30 NOTE — Assessment & Plan Note (Signed)
-  patient has history of CKD3a. Baseline creat ~ 1.3, eGFR 48 - patient presents with increase in creat >0.3 mg/dL above baseline, creat increase >1.5x baseline presumed to have occurred within past 7 days PTA - peaked at 3.92 - resolved with IVF and resuscitation

## 2020-10-30 NOTE — Assessment & Plan Note (Signed)
-   s/p intubation - now on RA

## 2020-10-30 NOTE — TOC Progression Note (Addendum)
Transition of Care [facility]) - Progression Note    Patient Details  Name: Mark Chang MRN: [id number] Date of Birth: [birth year]  Transition of Care [facility]) CM/SW [place], [place] Phone Number: 10/30/2020, 12:09 PM  Clinical Narrative:   Patient needs someone to speak for/represent his best interests.  Spoke with facility again who gave me another phone number for brother-[phone], and an address of [place], [place], [place] [address]. Will attempt to contact through alternate number-call DSS to report if unsuccessful. TOC will continue to follow during the course of hospitalization.  Addendum: Number is not a working number. Called DSS-left message.  Addendum II:  DSS called back. Made report.     Expected Discharge Plan: Skilled Nursing Facility Barriers to Discharge: SNF Pending bed offer  Expected Discharge Plan and Services Expected Discharge Plan: [place]   Discharge Planning Services: CM Consult Post Acute Care Choice: [place] Living arrangements for the past 2 months: Assisted Living Facility                                       Social Determinants of Health (SDOH) Interventions    Readmission Risk Interventions No flowsheet data found.

## 2020-10-30 NOTE — Hospital Course (Signed)
Mark Chang is a 75 yo male with PMH type 2 diabetes mellitus, essential hypertension, CKD stage IIIa, intellectual disability who resides at SNF who initially presented to [facility] ED with complaint of abdominal pain, diarrhea and was found to be hypotensive.  Per SNF staff, he reported abdominal pain for the last few weeks.     In the ED, he was found to have a blood pressure of 68/40, temperature 100.5 F, HR 105, lactic acid 2.2, WBC count 22.7.   CT abdomen/pelvis showed minimal amount of free air, cecal mass probably sigmoid colon with concern for perforated viscus.  GI and general surgery were consulted.  Patient admitted for management of abdominal pain secondary to cecal mass with likely perforated viscus.  Patient was initially monitored with conservative management, could not get a hold of family or legal guardian.  However, patient continued to clinically decline and patient ultimately underwent right colectomy with anastomosis, left colectomy with end colostomy by [name] on 7/28.  Postoperatively, patient remained in respiratory acidosis and was left on mechanical ventilation, required pressors.  He was transferred to ICU.  He was extubated 7/29 and transferred back to [facility] service on 7/30.  TPN was started 7/31.  Pathology from surgery returned.  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

## 2020-10-30 NOTE — Progress Notes (Signed)
SLP Cancellation Note  Patient Details Name: Mark Chang MRN: [id number] DOB: Apr 05, 1945   Cancelled treatment:       Reason Eval/Treat Not Completed: Other (comment) (pt npo at this time, CT abdomen completed, no results available as of this time, will continue efforts)  Mark Lime, MS [place] Office [phone] Pager [phone]  [name] 10/30/2020, 3:51 PM

## 2020-10-30 NOTE — Assessment & Plan Note (Addendum)
-   was at [facility]; tentative plan is for [facility] in [place] inquiring if SNF can sign patient in - pending POA help for MDM - patient is stable for discharge when bed available and approved/able to go

## 2020-10-30 NOTE — Assessment & Plan Note (Signed)
-   s/p ICU with pressors from hypovolemic shock - now improved and resovled

## 2020-10-30 NOTE — Progress Notes (Signed)
Physical Therapy Treatment Patient Details Name: Mark Chang MRN: [id number] DOB: February 23, 1946 Today's Date: 10/30/2020    History of Present Illness Mark Chang is a 75 year old male with a cognitive disability who presented with large bowel obstruction due to 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 Patient developed free air and required a right colectomy with anastomosis and left colectomy with end colostomy on 10/24/2020.    PT Comments    Assisted pt back to bed via MAXI MOVE lift as he had orders for CT.  Follow Up Recommendations  SNF     Equipment Recommendations  None recommended by PT    Recommendations for Other Services       Precautions / Restrictions Precautions Precautions: Other (comment) Precaution Comments: abdominal incision, colostomy, JP drain, heavy R lean Restrictions Weight Bearing Restrictions: No    Mobility  Bed Mobility Overal bed mobility: Needs Assistance Bed Mobility: Supine to Sit     Supine to sit: Max assist;+2 for physical assistance;+2 for safety/equipment     General bed mobility comments: assisted back to bed via LIFT.  + 2 Total Assist to scoot to [facility].    Transfers Overall transfer level: Needs assistance Equipment used: Rolling walker (2 wheeled) Transfers: Sit to/from Stand           General transfer comment: used MAXI MOVE lift to assist back to bed with RN as pt was to go downstairs for a CT  Ambulation/Gait             General Gait Details: unable at this time due to poor sit to stand ability   Stairs             Wheelchair Mobility    Modified Rankin (Stroke Patients Only)       Balance                                            Cognition Arousal/Alertness: Awake/alert Behavior During Therapy: WFL for tasks assessed/performed Overall Cognitive Status: History of cognitive impairments - at baseline                                 General Comments: AxO x  1.5 flat effect but able to express needs and have some conversation.      Exercises      General Comments        Pertinent Vitals/Pain Pain Assessment: Faces Faces Pain Scale: Hurts a little bit Pain Location: ABD with activity Pain Descriptors / Indicators: Discomfort;Grimacing;Tender Pain Intervention(s): Monitored during session    Home Living                      Prior Function            PT Goals (current goals can now be found in the care plan section) Progress towards PT goals: Progressing toward goals    Frequency    Min 2X/week      PT Plan Current plan remains appropriate    Co-evaluation              AM-PAC PT "6 Clicks" Mobility   Outcome Measure  Help needed turning from your back to your side while in a flat bed without using bedrails?: A Lot Help needed moving from lying on your  back to sitting on the side of a flat bed without using bedrails?: A Lot Help needed moving to and from a bed to a chair (including a wheelchair)?: A Lot Help needed standing up from a chair using your arms (e.g., wheelchair or bedside chair)?: Total Help needed to walk in hospital room?: Total Help needed climbing 3-5 steps with a railing? : Total 6 Click Score: 9    End of Session Equipment Utilized During Treatment: Gait belt Activity Tolerance: Patient limited by fatigue;Patient limited by pain Patient left: in bed;with bed alarm set Nurse Communication: Mobility status;Need for lift equipment PT Visit Diagnosis: Unsteadiness on feet (R26.81);Muscle weakness (generalized) (M62.81);Difficulty in walking, not elsewhere classified (R26.2)     Time: [id number] PT Time Calculation (min) (ACUTE ONLY): 19 min  Charges:  $Therapeutic Activity: 8-22 mins                     {Lexi Conaty  PTA Acute  Rehabilitation Services Pager      [phone] Office      [phone]

## 2020-10-30 NOTE — Assessment & Plan Note (Addendum)
-  Status post right colectomy with anastomosis, left colectomy with end colostomy by [name] 7/28 -s/p TPN; weaned off on 10/29/20 - tolerating soft diet -General surgery following - leukocytosis still present on 8/3; CT A/P ordered per surgery and negative for abscess; leukocytosis has been slowly downtrending off abx - per surgery, once stool output <1000 cc; as of 8/6 output starting to decrease, will need to trend to ensure stable (1835>>600>>475>>300) - pelvic drain, penrose drain, and staples removed on 8/9; stable for d/c from a surgery standpoint

## 2020-10-30 NOTE — Consult Note (Signed)
[place] Nurse ostomy follow up Patient receiving care in [place]. The one piece, flat pouch I placed on 8/1 remains intact, without washout or signs of impending leakage.  Therefore, I did not change it today. The stoma remains budded, moist, edematous, productive of thin brown liquid stool.  I emptied 175 ml from the pouch.  In speaking with his primary nurse, Melissa, I have learned that staff have asked him to let them know if the pouch begins to fill with gas; which apparently it does often.  The patient does not call to staff when this occurs.  He completely ignores the pouch and the contents of the pouch.  With his known cognitive limitations it will not be possible to teach him any ostomy care.  He will need complete ostomy care support upon discharge from the hospital.  During both of my visits with him, he has never acknowledged the ostomy, and I cannot get him to engage with what I am doing.  Ostomy pouching supplies are at the bedside.  Val Riles, RN, MSN, CWOCN, CNS-BC, pager [phone]

## 2020-10-30 NOTE — Progress Notes (Signed)
Progress Note    Mark Chang   WHQ:[id number]  DOB: 1945-11-25  DOA: 10/19/2020     10  PCP: Terrill Mohr, NP  CC: abdominal pain  Hospital Course: Mark Chang is a 75 yo male with PMH type 2 diabetes mellitus, essential hypertension, CKD stage IIIa, intellectual disability who resides at SNF who initially presented to [facility] ED with complaint of abdominal pain, diarrhea and was found to be hypotensive.  Per SNF staff, he reported abdominal pain for the last few weeks.     In the ED, he was found to have a blood pressure of 68/40, temperature 100.5 F, HR 105, lactic acid 2.2, WBC count 22.7.   CT abdomen/pelvis showed minimal amount of free air, cecal mass probably sigmoid colon with concern for perforated viscus.  GI and general surgery were consulted.  Patient admitted for management of abdominal pain secondary to cecal mass with likely perforated viscus.  Patient was initially monitored with conservative management, could not get a hold of family or legal guardian.  However, patient continued to clinically decline and patient ultimately underwent right colectomy with anastomosis, left colectomy with end colostomy by [name] on 7/28.  Postoperatively, patient remained in respiratory acidosis and was left on mechanical ventilation, required pressors.  He was transferred to ICU.  He was extubated 7/29 and transferred back to [facility] service on 7/30.  TPN was started 7/31.  Pathology from surgery returned.  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  Interval History:  No events overnight.  Intellectual disability appreciated on exam.  Patient unable to provide much collateral information but denies any abdominal pain or  nausea/vomiting.  ROS: Constitutional: negative for chills and fevers, Respiratory: negative for cough and sputum, Cardiovascular: negative for chest pain, and Gastrointestinal: negative for abdominal pain  Assessment & Plan: * Perforated abdominal viscus -Status post right colectomy with anastomosis, left colectomy with end colostomy by 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 - follow up CT A/P then will discuss with oncology on next steps - patient has IDD and unable to contact brother or POA; SW/CM assisting with learning who can help patient make medical decisions on next steps too  Intellectual disability - resides at [employer] [facility] - pending POA help for MDM  DM (diabetes mellitus), type 2 with renal complications ([place]) - continue SSI and CBG monitoring   Acute renal failure superimposed on stage 3a chronic kidney disease (HCC)-resolved as of 10/30/2020 - patient has history of CKD3a. Baseline creat ~ 1.3, eGFR 48 - patient presents with increase in creat >0.3 mg/dL above baseline, creat increase >1.5x baseline presumed to have occurred within past 7 days PTA - peaked at 3.92 - resolved with IVF and resuscitation   Acute respiratory failure with hypoxia (HCC)-resolved as of 10/30/2020 - s/p intubation - now on RA    Old records reviewed in assessment of  this patient  Antimicrobials:   DVT prophylaxis: enoxaparin (LOVENOX) injection 40 mg Start: 10/25/20 1400 Place and maintain sequential  compression device Start: 10/22/20 0852 SCDs Start: 10/20/20 0026   Code Status:   Code Status: Full Code Family Communication:   Disposition Plan: Status is: Inpatient  Remains inpatient appropriate because:IV treatments appropriate due to intensity of illness or inability to take PO and Inpatient level of care appropriate due to severity of illness  Dispo: The patient is from: SNF              Anticipated d/c is to: SNF              Patient currently is not medically stable to d/c.   Difficult to place patient No      Risk of unplanned readmission score: Unplanned Admission- Pilot do not use: 22.38   Objective: Blood pressure (!) 130/54, pulse 82, temperature 98 F (36.7 C), resp. rate 20, height 5' 7.99" (1.727 m), weight 117.3 kg, SpO2 97 %.  Examination: General appearance: alert, cooperative, no distress, and slowed mentation Head: Normocephalic, without obvious abnormality, atraumatic Eyes:  EOMI Lungs: clear to auscultation bilaterally Heart: regular rate and rhythm and S1, S2 normal Abdomen:  ostomy bag in place. Penrose drain noted in lower abd incision; 25 staples in place Extremities:  no edema Skin: mobility and turgor normal Neurologic: IDD noted; no other focal deficits  Consultants:  Surgery  Procedures:    Data Reviewed: I have personally reviewed following labs and imaging studies Results for orders placed or performed during the hospital encounter of 10/19/20 (from the past 24 hour(s))  Glucose, capillary     Status: Abnormal   Collection Time: 10/29/20  5:08 PM  Result Value Ref Range   Glucose-Capillary 274 (H) 70 - 99 mg/dL   Comment 1 Notify RN   Glucose, capillary     Status: Abnormal   Collection Time: 10/29/20  8:50 PM  Result Value Ref Range   Glucose-Capillary 307 (H) 70 - 99 mg/dL  CBC     Status: Abnormal   Collection Time: 10/30/20  3:16 AM  Result Value Ref Range   WBC 21.6 (H) 4.0 - 10.5 K/uL   RBC 2.96 (L) 4.22 - 5.81 MIL/uL    Hemoglobin 8.4 (L) 13.0 - 17.0 g/dL   HCT 27.0 (L) 39.0 - 52.0 %   MCV 91.2 80.0 - 100.0 fL   MCH 28.4 26.0 - 34.0 pg   MCHC 31.1 30.0 - 36.0 g/dL   RDW 16.0 (H) 11.5 - 15.5 %   Platelets 287 150 - 400 K/uL   nRBC 0.2 0.0 - 0.2 %  Basic metabolic panel     Status: Abnormal   Collection Time: 10/30/20  3:16 AM  Result Value Ref Range   Sodium 136 135 - 145 mmol/L   Potassium 3.9 3.5 - 5.1 mmol/L   Chloride 107 98 - 111 mmol/L   CO2 24 22 - 32 mmol/L   Glucose, Bld 253 (H) 70 - 99 mg/dL   BUN 26 (H) 8 - 23 mg/dL   Creatinine, Ser 1.06 0.61 - 1.24 mg/dL   Calcium 7.5 (L) 8.9 - 10.3 mg/dL   GFR, Estimated >60 >60 mL/min   Anion gap 5 5 - 15  Glucose, capillary     Status: Abnormal   Collection Time: 10/30/20  7:41 AM  Result Value Ref Range   Glucose-Capillary 191 (H) 70 - 99 mg/dL  Glucose, capillary  Status: Abnormal   Collection Time: 10/30/20 12:00 PM  Result Value Ref Range   Glucose-Capillary 128 (H) 70 - 99 mg/dL   Comment 1 Notify RN     Recent Results (from the past 240 hour(s))  C Difficile Quick Screen w PCR reflex     Status: None   Collection Time: 10/20/20  4:58 PM   Specimen: STOOL  Result Value Ref Range Status   C Diff antigen NEGATIVE NEGATIVE Final   C Diff toxin NEGATIVE NEGATIVE Final   C Diff interpretation No C. difficile detected.  Final    Comment: Performed at [facility], [place] [address] [place], [place] [address]  Culture, blood (routine x 2)     Status: None   Collection Time: 10/24/20  7:08 PM   Specimen: BLOOD  Result Value Ref Range Status   Specimen Description   Final    BLOOD RIGHT ANTECUBITAL Performed at [facility] [address] [place], [place] [address]    Special Requests   Final    BOTTLES DRAWN AEROBIC AND ANAEROBIC Blood Culture adequate volume Performed at [facility] [address], [place], [place] [address]    Culture   Final    NO GROWTH 5  DAYS Performed at [facility], [place] [address], [place], [place] [address]    Report Status 10/30/2020 FINAL  Final  Culture, blood (routine x 2)     Status: None   Collection Time: 10/24/20  7:08 PM   Specimen: BLOOD  Result Value Ref Range Status   Specimen Description   Final    BLOOD LEFT ANTECUBITAL Performed at [facility] [address] [place], [place] [address]    Special Requests   Final    BOTTLES DRAWN AEROBIC AND ANAEROBIC Blood Culture results may not be optimal due to an inadequate volume of blood received in culture bottles Performed at [facility] [address], [place], [place] [address]    Culture   Final    NO GROWTH 5 DAYS Performed at [facility], [place] [address] [place],  [address]    Report Status 10/30/2020 FINAL  Final     Radiology Studies: DG CHEST PORT 1 VIEW  Result Date: 10/29/2020 CLINICAL DATA:  Tiredness. EXAM: PORTABLE CHEST 1 VIEW COMPARISON:  Chest x-ray 10/24/2020. FINDINGS: Interim extubation and removal of NG tube. Right PICC line tip at cavoatrial junction. Heart size stable. No pulmonary venous congestion. Low lung volumes. Stable elevation right hemidiaphragm with mild right base subsegmental atelectasis. Tiny right pleural effusion. No pneumothorax. No pneumoperitoneum noted on today's exam. IMPRESSION: 1. Interim extubation and removal of NG tube. Right PICC line noted with tip at cavoatrial junction. 2. Low lung volumes. Stable elevation right hemidiaphragm with mild right base subsegmental atelectasis. Tiny right pleural effusion. 3.  No pneumoperitoneum noted on today's exam. Electronically Signed   By: [place]   On: 10/29/2020 13:50   DG CHEST PORT 1 VIEW  Final Result    [place] EKG SITE RITE  Final Result    CT HEAD WO CONTRAST  Final Result    DG Abd 1 View  Final Result    DG CHEST PORT 1 VIEW  Final Result    DG Abd Portable 1V  Final Result    DG Chest  Port 1 View  Final Result  Addendum (preliminary) 1 of 1  ADDENDUM REPORT: 10/24/2020 10:05  ADDENDUM:  Free air discussed by telephone with Dr. Eddie Dibbles STECHSCHULTE on  10/24/2020 at 1000 hours.      Electronically Signed    By: Genevie Ann M.D.    On: 10/24/2020 10:05      Final    DG Abd Portable 1V  Final Result    CT ABDOMEN PELVIS WO CONTRAST  Final Result    DG Chest Port 1 View  Final Result    CT ABDOMEN PELVIS W CONTRAST    (Results Pending)    Scheduled Meds:  (feeding supplement) PROSource Plus  30 mL Oral BID BM   amiodarone  200 mg Oral BID   amLODipine  5 mg Oral Daily   carvedilol  6.25 mg Oral BID WC   Chlorhexidine Gluconate Cloth  6 each Topical Daily   docusate sodium  100 mg Oral BID   enoxaparin (LOVENOX) injection  40 mg Subcutaneous Q24H   feeding supplement  237 mL Oral TID BM   insulin aspart  0-20 Units Subcutaneous TID WC   insulin aspart  0-5 Units Subcutaneous QHS   insulin aspart  5 Units Subcutaneous TID WC   insulin glargine-yfgn  40 Units Subcutaneous QHS   lisinopril  40 mg Oral Daily   mouth rinse  15 mL Mouth Rinse BID   multivitamin with minerals  1 tablet Oral Daily   pneumococcal 23 valent vaccine  0.5 mL Intramuscular Tomorrow-1000   polycarbophil  625 mg Oral BID   sodium chloride flush  10-40 mL Intracatheter Q12H   sodium chloride flush  10-40 mL Intracatheter Q12H   PRN Meds: doxylamine (Sleep), fentaNYL (SUBLIMAZE) injection, hydrALAZINE, metoprolol tartrate, [DISCONTINUED] ondansetron **OR** ondansetron (ZOFRAN) IV, oxyCODONE, polyvinyl alcohol, sodium chloride flush, sodium chloride flush Continuous Infusions:  sodium chloride 10 mL/hr at 10/29/20 1220     LOS: 10 days  Time spent: Greater than 50% of the 35 minute visit was spent in counseling/coordination of care for the patient as laid out in the A&P.   Dwyane Dee, MD [facility] 10/30/2020, 3:56 PM

## 2020-10-30 NOTE — Progress Notes (Signed)
Progress Note  6 Days Post-Op  Subjective: Patient denies abdominal pain or nausea. Tolerating diet well. He is hungry now but NPO for a CT scan.  Objective: Vital signs in last 24 hours: Temp:  [97.6 F (36.4 C)-98.8 F (37.1 C)] 98.4 F (36.9 C) (08/03 0511) Pulse Rate:  [90-106] 90 (08/03 0511) Resp:  [18-30] 20 (08/03 0511) BP: (122-149)/(56-68) 122/59 (08/03 0511) SpO2:  [95 %-99 %] 96 % (08/03 0511) Last BM Date: 10/29/20  Intake/Output from previous day: 08/02 0701 - 08/03 0700 In: 2299.5 [P.O.:965; I.V.:1041.2; IV Piggyback:293.4] Out: 3710 [Urine:1700; Drains:560; Stool:1450] Intake/Output this shift: Total I/O In: 10 [I.V.:10] Out: 675 [Drains:200; Stool:475]  PE: Gen: NAD Abd: soft, doesn't seem tender, colostomy pouch with liquid output, 1500cc yesterday, stoma is pink and viable, unfortunately has leaked and contaminated his midline wound.  Dressing removed as it was saturated.  Some sero/cloudy drainage noted from around his penrose.  Staples intact, some erythema around the staples more c/w staple irritation and not infection at this time.  JP completely full of serous fluid.  I emptied this myself and recharged drain.   Lab Results:  Recent Labs    10/29/20 0421 10/30/20 0316  WBC 17.5* 21.6*  HGB 8.9* 8.4*  HCT 28.3* 27.0*  PLT 228 287   BMET Recent Labs    10/29/20 0421 10/30/20 0316  NA 136 136  K 3.8 3.9  CL 109 107  CO2 24 24  GLUCOSE 284* 253*  BUN 25* 26*  CREATININE 1.01 1.06  CALCIUM 7.2* 7.5*   PT/INR No results for input(s): LABPROT, INR in the last 72 hours. CMP     Component Value Date/Time   NA 136 10/30/2020 0316   K 3.9 10/30/2020 0316   CL 107 10/30/2020 0316   CO2 24 10/30/2020 0316   GLUCOSE 253 (H) 10/30/2020 0316   BUN 26 (H) 10/30/2020 0316   CREATININE 1.06 10/30/2020 0316   CALCIUM 7.5 (L) 10/30/2020 0316   PROT 5.3 (L) 10/28/2020 0456   ALBUMIN 1.7 (L) 10/28/2020 0456   AST 20 10/28/2020 0456   ALT 18  10/28/2020 0456   ALKPHOS 53 10/28/2020 0456   BILITOT 0.4 10/28/2020 0456   GFRNONAA >60 10/30/2020 0316   GFRAA >60 03/24/2017 0238   Lipase  No results found for: LIPASE     Studies/Results: DG CHEST PORT 1 VIEW  Result Date: 10/29/2020 CLINICAL DATA:  Tiredness. EXAM: PORTABLE CHEST 1 VIEW COMPARISON:  Chest x-ray 10/24/2020. FINDINGS: Interim extubation and removal of NG tube. Right PICC line tip at cavoatrial junction. Heart size stable. No pulmonary venous congestion. Low lung volumes. Stable elevation right hemidiaphragm with mild right base subsegmental atelectasis. Tiny right pleural effusion. No pneumothorax. No pneumoperitoneum noted on today's exam. IMPRESSION: 1. Interim extubation and removal of NG tube. Right PICC line noted with tip at cavoatrial junction. 2. Low lung volumes. Stable elevation right hemidiaphragm with mild right base subsegmental atelectasis. Tiny right pleural effusion. 3.  No pneumoperitoneum noted on today's exam. Electronically Signed   By: [place]   On: 10/29/2020 13:50    Anti-infectives: Anti-infectives (From admission, onward)    Start     Dose/Rate Route Frequency Ordered Stop   10/27/20 1400  ceFEPIme (MAXIPIME) 2 g in sodium chloride 0.9 % 100 mL IVPB        2 g 200 mL/hr over 30 Minutes Intravenous Every 8 hours 10/27/20 0927 10/27/20 2250   10/25/20 1400  ceFEPIme (MAXIPIME) 2  g in sodium chloride 0.9 % 100 mL IVPB  Status:  Discontinued        2 g 200 mL/hr over 30 Minutes Intravenous Every 12 hours 10/25/20 0729 10/27/20 0927   10/23/20 1000  ceFEPIme (MAXIPIME) 2 g in sodium chloride 0.9 % 100 mL IVPB  Status:  Discontinued        2 g 200 mL/hr over 30 Minutes Intravenous Every 8 hours 10/23/20 0833 10/25/20 0729   10/21/20 1000  ceFEPIme (MAXIPIME) 2 g in sodium chloride 0.9 % 100 mL IVPB  Status:  Discontinued        2 g 200 mL/hr over 30 Minutes Intravenous Every 12 hours 10/21/20 0742 10/23/20 0833   10/21/20 0600   metroNIDAZOLE (FLAGYL) IVPB 500 mg        500 mg 100 mL/hr over 60 Minutes Intravenous Every 8 hours 10/20/20 0028 10/28/20 0759   10/20/20 2200  ceFEPIme (MAXIPIME) 2 g in sodium chloride 0.9 % 100 mL IVPB  Status:  Discontinued        2 g 200 mL/hr over 30 Minutes Intravenous Every 24 hours 10/20/20 0048 10/21/20 0742   10/19/20 2030  ceFEPIme (MAXIPIME) 2 g in sodium chloride 0.9 % 100 mL IVPB        2 g 200 mL/hr over 30 Minutes Intravenous  Once 10/19/20 2029 10/20/20 0033   10/19/20 2030  metroNIDAZOLE (FLAGYL) IVPB 500 mg        500 mg 100 mL/hr over 60 Minutes Intravenous  Once 10/19/20 2029 10/19/20 2210        Assessment/Plan POD 6, S/P right colectomy with anastomosis, left colectomy with end colostomy, mobilization of splenic flexure 10/24/20 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  Will readd fiber BID. - pelvic drain is SS - surgical path shows 2 cancers.  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  Left colon is invasive moderately differentiated adenocarcinoma with involvement in surrounding soft tissue and 3/12 LN + - DC TPN and continue nutritional supplements - mobilize - WOC following  -CT scan today as WBC continues to rise and is up to 20K today.  Patient looks well, but given his intellectual disability difficult to tell if we may be missing something.  Pending CT findings will determine if we need to restart abx therapy. Severe protein calorie malnutrition - prealbumin 7.6, PO intake improving   FEN: SOFT diet, DC TPN, add ensure VTE: LMWH ID: cefepime/flagyl 7/23>8/1;    T2DM HTN CKD stage III Intellectual disability residing at SNF  LOS: 10 days    Henreitta Cea, [facility] Surgery 10/30/2020, 11:32 AM Please see Amion for pager number during day hours 7:00am-4:30pm

## 2020-10-30 NOTE — Consult Note (Signed)
[place] Nurse ostomy follow up Patient receiving care in [id number]. Primary nurse, Lenna Sciara, and NT, Lelan Pons, present at time of my visit. Stoma type/location: LUQ colostomy Stomal assessment/size: 1 3/4 inches, round, budded, moist, red.  Was asked by PA to come to room as existing pouch was leaking. Peristomal assessment: intact Treatment options for stomal/peristomal skin: barrier ring Output: liquid brown Ostomy pouching: 1pc. Flat with barrier ring Education provided: none Enrolled patient in [place] Start Discharge program: No -- patient to be discharged to SNF is my understanding.  The facility, in that case, will provide supplies. Val Riles, RN, MSN, CWOCN, CNS-BC, pager [phone]

## 2020-10-31 DIAGNOSIS — R198 Other specified symptoms and signs involving the digestive system and abdomen: Secondary | ICD-10-CM | POA: Diagnosis not present

## 2020-10-31 DIAGNOSIS — R197 Diarrhea, unspecified: Secondary | ICD-10-CM

## 2020-10-31 LAB — CBC WITH DIFFERENTIAL/PLATELET
Abs Immature Granulocytes: 1.81 10*3/uL — ABNORMAL HIGH (ref 0.00–0.07)
Basophils Absolute: 0.1 10*3/uL (ref 0.0–0.1)
Basophils Relative: 1 %
Eosinophils Absolute: 0.3 10*3/uL (ref 0.0–0.5)
Eosinophils Relative: 2 %
HCT: 26.3 % — ABNORMAL LOW (ref 39.0–52.0)
Hemoglobin: 7.9 g/dL — ABNORMAL LOW (ref 13.0–17.0)
Immature Granulocytes: 10 %
Lymphocytes Relative: 12 %
Lymphs Abs: 2.1 10*3/uL (ref 0.7–4.0)
MCH: 28 pg (ref 26.0–34.0)
MCHC: 30 g/dL (ref 30.0–36.0)
MCV: 93.3 fL (ref 80.0–100.0)
Monocytes Absolute: 1 10*3/uL (ref 0.1–1.0)
Monocytes Relative: 6 %
Neutro Abs: 12.4 10*3/uL — ABNORMAL HIGH (ref 1.7–7.7)
Neutrophils Relative %: 69 %
Platelets: 341 10*3/uL (ref 150–400)
RBC: 2.82 MIL/uL — ABNORMAL LOW (ref 4.22–5.81)
RDW: 16.6 % — ABNORMAL HIGH (ref 11.5–15.5)
WBC: 17.7 10*3/uL — ABNORMAL HIGH (ref 4.0–10.5)
nRBC: 0.2 % (ref 0.0–0.2)

## 2020-10-31 LAB — GLUCOSE, CAPILLARY
Glucose-Capillary: 138 mg/dL — ABNORMAL HIGH (ref 70–99)
Glucose-Capillary: 234 mg/dL — ABNORMAL HIGH (ref 70–99)
Glucose-Capillary: 257 mg/dL — ABNORMAL HIGH (ref 70–99)
Glucose-Capillary: 200 mg/dL — ABNORMAL HIGH (ref 70–99)

## 2020-10-31 LAB — BASIC METABOLIC PANEL
Anion gap: 5 (ref 5–15)
BUN: 22 mg/dL (ref 8–23)
CO2: 25 mmol/L (ref 22–32)
Calcium: 7.7 mg/dL — ABNORMAL LOW (ref 8.9–10.3)
Chloride: 107 mmol/L (ref 98–111)
Creatinine, Ser: 0.95 mg/dL (ref 0.61–1.24)
GFR, Estimated: >60 mL/min (ref >60)
Glucose, Bld: 174 mg/dL — ABNORMAL HIGH (ref 70–99)
Potassium: 4 mmol/L (ref 3.5–5.1)
Sodium: 137 mmol/L (ref 135–145)

## 2020-10-31 LAB — C DIFFICILE (CDIFF) QUICK SCRN (NO PCR REFLEX)
C Diff antigen: NEGATIVE
C Diff interpretation: NOT DETECTED
C Diff toxin: NEGATIVE

## 2020-10-31 LAB — MAGNESIUM: Magnesium: 1.7 mg/dL (ref 1.7–2.4)

## 2020-10-31 MED ORDER — LOPERAMIDE HCL 2 MG PO CAPS
2.0000 mg | ORAL_CAPSULE | Freq: Two times a day (BID) | ORAL | Status: DC
  Administered 2020-10-31 – 2020-11-01 (×3): 2 mg via ORAL
  Filled 2020-10-31 (×3): qty 1

## 2020-10-31 NOTE — Progress Notes (Signed)
Progress Note  7 Days Post-Op  Subjective: Patient denies abdominal pain or nausea. Tolerating diet well.   Objective: Vital signs in last 24 hours: Temp:  [97.6 F (36.4 C)-98.5 F (36.9 C)] 98.5 F (36.9 C) (08/04 0444) Pulse Rate:  [82-88] 82 (08/04 0444) Resp:  [20-25] 25 (08/04 0444) BP: (120-130)/(54-57) 129/57 (08/04 0444) SpO2:  [97 %] 97 % (08/04 0444) Last BM Date: 10/30/20 (colostomy pouch)  Intake/Output from previous day: 08/03 0701 - 08/04 0700 In: 66 [P.O.:960; I.V.:20] Out: 3800 [Urine:1900; Drains:350; Stool:1550] Intake/Output this shift: Total I/O In: -  Out: 325 [Stool:325]  PE: Gen: NAD Abd: soft, doesn't seem tender, colostomy pouch with liquid output, 1500cc yesterday, stoma is pink and viable.  Midline incision is c/d/I with staples present.  JP with serous output 350cc/24hrs   Lab Results:  Recent Labs    10/30/20 0316 10/31/20 0412  WBC 21.6* 17.7*  HGB 8.4* 7.9*  HCT 27.0* 26.3*  PLT 287 341   BMET Recent Labs    10/30/20 0316 10/31/20 0412  NA 136 137  K 3.9 4.0  CL 107 107  CO2 24 25  GLUCOSE 253* 174*  BUN 26* 22  CREATININE 1.06 0.95  CALCIUM 7.5* 7.7*   PT/INR No results for input(s): LABPROT, INR in the last 72 hours. CMP     Component Value Date/Time   NA 137 10/31/2020 0412   K 4.0 10/31/2020 0412   CL 107 10/31/2020 0412   CO2 25 10/31/2020 0412   GLUCOSE 174 (H) 10/31/2020 0412   BUN 22 10/31/2020 0412   CREATININE 0.95 10/31/2020 0412   CALCIUM 7.7 (L) 10/31/2020 0412   PROT 5.3 (L) 10/28/2020 0456   ALBUMIN 1.7 (L) 10/28/2020 0456   AST 20 10/28/2020 0456   ALT 18 10/28/2020 0456   ALKPHOS 53 10/28/2020 0456   BILITOT 0.4 10/28/2020 0456   GFRNONAA >60 10/31/2020 0412   GFRAA >60 03/24/2017 0238   Lipase  No results found for: LIPASE     Studies/Results: CT ABDOMEN PELVIS W CONTRAST  Result Date: 10/30/2020 CLINICAL DATA:  POD 6, S/P right colectomy with anastomosis, left colectomy with  end colostomy, mobilization of splenic flexure 10/24/20 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 CONTRAST:  16m OMNIPAQUE IOHEXOL 350 MG/ML SOLN COMPARISON:  CT abdomen pelvis 10/19/2020 FINDINGS: Lower chest: Trace right pleural effusion. Right lower lobe airspace opacity. Central venous catheter terminating at the superior cavoatrial junction. Coronary artery calcifications involving at least 3 vessels. Hepatobiliary: The hepatic parenchyma is diffusely hypodense compared to the splenic parenchyma consistent with fatty infiltration. No focal liver abnormality. Multiple calcified gallstones within the gallbladder lumen. No gallbladder wall thickening or pericholecystic fluid. No biliary dilatation. Pancreas: No focal lesion. Normal pancreatic contour. No surrounding inflammatory changes. No main pancreatic ductal dilatation. Spleen: Normal in size without focal abnormality. Adrenals/Urinary Tract: No adrenal nodule bilaterally. Bilateral kidneys enhance symmetrically. Several bilateral fluid density lesions within the kidneys likely represent simple renal cysts. Subcentimeter hypodensities are too small to characterize. No hydronephrosis. No hydroureter. The urinary bladder is unremarkable. Stomach/Bowel: Surgical changes related to a Hartmann pouch formation (2:74) and partial left colectomy, partial right colectomy status post ileocolonic anastomosis (2:59), left lower quadrant end colostomy formation (2:63).  Partial opacification of the terminal ileum and transverse colon in the right abdomen. No PO contrast extravasation noted on the portal venous or delayed phase. Stomach is within  normal limits. No evidence of small bowel wall thickening or dilatation. Proximal transverse colon bowel wall thickening and mild pericolonic fat stranding. Vascular/Lymphatic: No abdominal aorta or iliac aneurysm. Mild atherosclerotic plaque of the aorta and its branches. No abdominal, pelvic, or inguinal lymphadenopathy. Reproductive: Prostate is unremarkable. Other: Trace to small volume free fluid within the right abdomen with a couple of tiny foci of associated free gas (2: 47-60). No organized fluid collection. Right surgical drain with tip terminating in the pelvis. Musculoskeletal: Healing anterior abdominal incision. No hernia formation. Mild subcutaneus soft tissue edema. No suspicious lytic or blastic osseous lesions. No acute displaced fracture. Multilevel degenerative changes of the spine. IMPRESSION: 1. Inflammatory changes of the proximal transverse colon just distal to the side-to-side ileocolonic anastomosis. 2. Trace to small volume free fluid within the right abdomen may be postsurgical versus inflammatory. Couple tiny foci of gas within the right abdomen are likely postsurgical in etiology. 3. No definite findings of bowel leak or anastomotic suture dehiscence in a patient status post partial right colectomy with ileocolonic side-to-side anastomosis, partial left colectomy, and left lower quadrant end colostomy with Hartmann pouch formation. 4. Cholelithiasis with no CT findings to suggest acute cholecystitis. 5.  Aortic Atherosclerosis (ICD10-I70.0). Electronically Signed   By: Iven Finn M.D.   On: 10/30/2020 17:55    Anti-infectives: Anti-infectives (From admission, onward)    Start     Dose/Rate Route Frequency Ordered Stop   10/27/20 1400  ceFEPIme (MAXIPIME) 2 g in sodium chloride 0.9 % 100 mL IVPB        2 g 200 mL/hr over 30 Minutes Intravenous Every 8 hours 10/27/20 0927 10/27/20 2250   10/25/20 1400  ceFEPIme (MAXIPIME) 2 g in sodium chloride 0.9 % 100 mL IVPB  Status:   Discontinued        2 g 200 mL/hr over 30 Minutes Intravenous Every 12 hours 10/25/20 0729 10/27/20 0927   10/23/20 1000  ceFEPIme (MAXIPIME) 2 g in sodium chloride 0.9 % 100 mL IVPB  Status:  Discontinued        2 g 200 mL/hr over 30 Minutes Intravenous Every 8 hours 10/23/20 0833 10/25/20 0729   10/21/20 1000  ceFEPIme (MAXIPIME) 2 g in sodium chloride 0.9 % 100 mL IVPB  Status:  Discontinued        2 g 200 mL/hr over 30 Minutes Intravenous Every 12 hours 10/21/20 0742 10/23/20 0833   10/21/20 0600  metroNIDAZOLE (FLAGYL) IVPB 500 mg        500 mg 100 mL/hr over 60 Minutes Intravenous Every 8 hours 10/20/20 0028 10/28/20 0759   10/20/20 2200  ceFEPIme (MAXIPIME) 2 g in sodium chloride 0.9 % 100 mL IVPB  Status:  Discontinued        2 g 200 mL/hr over 30 Minutes Intravenous Every 24 hours 10/20/20 0048 10/21/20 0742   10/19/20 2030  ceFEPIme (MAXIPIME) 2 g in sodium chloride 0.9 % 100 mL IVPB        2 g 200 mL/hr over 30 Minutes Intravenous  Once 10/19/20 2029 10/20/20 0033   10/19/20 2030  metroNIDAZOLE (FLAGYL) IVPB 500 mg        500 mg 100 mL/hr over 60 Minutes Intravenous  Once 10/19/20 2029 10/19/20 2210        Assessment/Plan POD 7, S/P right  colectomy with anastomosis, left colectomy with end colostomy, mobilization of splenic flexure 10/24/20 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 Fiber BID. -CT with no infectious findings necessarily to explain WBC, which is down today to 17K except maybe some colitis.  Given his high colostomy liquid output, a c diff was checked which was negative. -will add imodium '2mg'$  BID to start with and see how this helps. - pelvic drain is SS - surgical path shows 2 cancers.  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  Left colon is invasive moderately differentiated adenocarcinoma with involvement in surrounding soft tissue and 3/12 LN + -onc to arrange outpatient follow up - ontinue nutritional supplements - mobilize - WOC following  Severe protein calorie malnutrition - prealbumin 7.6, PO intake improving   FEN: SOFT diet, ensure VTE: LMWH ID: [email or web address] 7/23>8/1; no need for more abx at this time. Monitor WBC   T2DM HTN CKD stage III Intellectual disability residing at [facility]  LOS: 11 days    Henreitta Cea, [facility] Surgery 10/31/2020, 12:24 PM Please see Amion for pager number during day hours 7:00am-4:30pm

## 2020-10-31 NOTE — TOC Progression Note (Addendum)
Transition of Care [facility]) - Progression Note    Patient Details  Name: CHANNON AGUDELO MRN: [id number] Date of Birth: 03-06-1946  Transition of Care [facility] CM/SW [place], [place] Phone Number: 10/31/2020, 2:58 PM  Clinical Narrative:  Only bed offer is from [facility].  Asked Ebony Hail to start insurance authorization. TOC will continue to follow during the course of hospitalization.  Addendum:  Bryson Ha states that it is imperative someone sign him in/paperwork.  To be determined.      Expected Discharge Plan: Skilled Nursing Facility Barriers to Discharge: SNF Pending bed offer  Expected Discharge Plan and Services Expected Discharge Plan: [facility]   Discharge Planning Services: CM Consult Post Acute Care Choice: [place] Living arrangements for the past 2 months: Assisted Living Facility                                       Social Determinants of Health (SDOH) Interventions    Readmission Risk Interventions No flowsheet data found.

## 2020-10-31 NOTE — Consult Note (Signed)
[place] Nurse ostomy follow up Patient receiving care in [place].  Due to the patient's inability to learn ostomy self care, his non-engagement in ostomy care interactions, and the stability of the stoma and pouching system, I am signing the [place] team off from further follow up.  If you feel the [place] team is needed for future involvement, please re-consult. Val Riles, RN, MSN, CWOCN, CNS-BC, pager [phone]

## 2020-10-31 NOTE — Progress Notes (Signed)
  Speech Language Pathology Treatment: Dysphagia  Patient Details Name: Mark Chang MRN: [id number] DOB: [birth year] Today's Date: 10/31/2020 Time: 6349-4944 SLP Time Calculation (min) (ACUTE ONLY): 21 min  Assessment / Plan / Recommendation Clinical Impression  Pt with functional oropharyngeal swallow at this time based on clinical swallow evaluation. He easily passed the 3 ounce Yale water challenge and is able to "masticate" soft solids without oral retention.  Pt does report issues with infrequent coughing on liquids and having GERD.  Belch noted post-swallow of water - and pt confirmed issues with reflux.  Recommend continue his mechanical soft/thin diet with strict reflux precautions.   Reviewed recommendations with pt using teach back.  Pt's voice remains mildly dysphonic - and he admits this is not baseline- suspect due to his intubation. Pt confirms episodic mild dyspnea with intake at times - thus recommend he rest if short of breath.  He reports his breathing to be improving.  Pt's volitional cough is also weak and encouraged him to strengthen voice and cough for airway protection.    Using teach back, pt educated with mod I for strategies. NO further SLP follow up needed. Thanks.     HPI HPI: Patient is a 75 y.o. male with PMH: DM-2, intellectual disability, CKD-3 who presented to ED from SNF with c/o abdominal pain, diarrhea, low BP. In ED, patient was septic, initial BP 90/57, improved to 117/61 with IVF bolus. 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 He initially improved with medical management but then developed free air, so he had emergent right colectomy with anastomosis and left colectomy with end colostomy on 7/28. He is at high risk for ileus.      SLP Plan  All goals met       Recommendations  Diet recommendations: Thin liquid;Dysphagia 3 (mechanical soft) Liquids provided via: Cup;Straw Medication  Administration: Other (Comment) (as tolerated) Supervision: Patient able to self feed;Intermittent supervision to cue for compensatory strategies Compensations: Minimize environmental distractions;Slow rate;Small sips/bites Postural Changes and/or Swallow Maneuvers: Seated upright 90 degrees;Upright 30-60 min after meal                Oral Care Recommendations: Oral care BID;Staff/trained caregiver to provide oral care Follow up Recommendations: None SLP Visit Diagnosis: Dysphagia, unspecified (R13.10) Plan: All goals met       GO              Kathleen Lime, MS [facility] SLP Acute Rehab Services Office [phone] Pager [phone]   [name] 10/31/2020, 10:31 AM

## 2020-10-31 NOTE — Progress Notes (Signed)
Progress Note    Mark Chang   ZDG:[id number]  DOB: September 08, 1945  DOA: 10/19/2020     11  PCP: Terrill Mohr, NP  CC: abdominal pain  Hospital Course: Mark Chang is a 75 yo male with PMH type 2 diabetes mellitus, essential hypertension, CKD stage IIIa, intellectual disability who resides at SNF who initially presented to [facility] ED with complaint of abdominal pain, diarrhea and was found to be hypotensive.  Per SNF staff, he reported abdominal pain for the last few weeks.     In the ED, he was found to have a blood pressure of 68/40, temperature 100.5 F, HR 105, lactic acid 2.2, WBC count 22.7.   CT abdomen/pelvis showed minimal amount of free air, cecal mass probably sigmoid colon with concern for perforated viscus.  GI and general surgery were consulted.  Patient admitted for management of abdominal pain secondary to cecal mass with likely perforated viscus.  Patient was initially monitored with conservative management, could not get a hold of family or legal guardian.  However, patient continued to clinically decline and patient ultimately underwent right colectomy with anastomosis, left colectomy with end colostomy by Dr. Thermon Chang on 7/28.  Postoperatively, patient remained in respiratory acidosis and was left on mechanical ventilation, required pressors.  He was transferred to ICU.  He was extubated 7/29 and transferred back to [facility] service on 7/30.  TPN was started 7/31.  Pathology from surgery returned.  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  Interval History:  No events overnight. Resting in bed comfortably; no pain or N/V at this time.   ROS: Constitutional: negative for chills and fevers, Respiratory: negative for cough and sputum,  Cardiovascular: negative for chest pain, and Gastrointestinal: negative for abdominal pain  Assessment & Plan: * Perforated abdominal viscus -Status post right colectomy with anastomosis, left colectomy with end colostomy by Dr. 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 - discussed with oncology; he can have outpatient followup once healed from surgery; needs CT chest at some point to complete staging prior to discharge - patient has IDD and unable to contact brother or POA; SW/CM assisting with learning who can help patient make medical decisions on next steps too  Intellectual disability - resides at [facility] - pending POA help for MDM  DM (diabetes mellitus), type 2 with renal complications ([place]) - continue SSI and CBG monitoring   Acute renal failure superimposed on stage 3a chronic kidney disease (HCC)-resolved as of 10/30/2020 - patient has history of CKD3a. Baseline creat ~ 1.3, eGFR 48 - patient presents with increase in creat >0.3 mg/dL above baseline, creat increase >1.5x baseline presumed to have occurred within past 7 days PTA - peaked at 3.92 - resolved with IVF and resuscitation  Acute respiratory failure with hypoxia (HCC)-resolved as of 10/30/2020 - s/p intubation - now on RA   Old records reviewed in assessment of this patient  Antimicrobials:   DVT prophylaxis:  enoxaparin (LOVENOX) injection 40 mg Start: 10/25/20 1400 Place and maintain sequential compression device Start: 10/22/20 0852 SCDs Start: 10/20/20 0026   Code Status:   Code Status: Full Code Family Communication:   Disposition Plan: Status is: Inpatient  Remains inpatient appropriate because:IV treatments appropriate due to intensity of illness or inability to take PO and Inpatient level of care appropriate due to severity of illness  Dispo: The patient is from: SNF              Anticipated d/c is to: SNF              Patient currently is not medically stable to d/c.   Difficult to place patient No Risk of unplanned readmission score: Unplanned Admission- Pilot do not use: 20.27   Objective: Blood pressure (!) 112/49, pulse 85, temperature (!) 97.4 F (36.3 C), temperature source Oral, resp. rate (!) 32, height 5' 7.99" (1.727 m), weight 117.3 kg, SpO2 98 %.  Examination: General appearance: alert, cooperative, no distress, and slowed mentation Head: Normocephalic, without obvious abnormality, atraumatic Eyes:  EOMI Lungs: clear to auscultation bilaterally Heart: regular rate and rhythm and S1, S2 normal Abdomen:  ostomy bag in place with increased amount of liquid brown stool. Penrose drain noted in lower abd incision; 25 staples in place Extremities:  no edema Skin: mobility and turgor normal Neurologic: IDD noted; no other focal deficits  Consultants:  Surgery  Procedures:    Data Reviewed: I have personally reviewed following labs and imaging studies Results for orders placed or performed during the hospital encounter of 10/19/20 (from the past 24 hour(s))  Glucose, capillary     Status: Abnormal   Collection Time: 10/30/20  4:48 PM  Result Value Ref Range   Glucose-Capillary 126 (H) 70 - 99 mg/dL   Comment 1 Notify RN   Glucose, capillary     Status: Abnormal   Collection Time: 10/30/20  9:56 PM  Result Value Ref Range   Glucose-Capillary 132 (H) 70 - 99 mg/dL    Comment 1 Notify RN   Basic metabolic panel     Status: Abnormal   Collection Time: 10/31/20  4:12 AM  Result Value Ref Range   Sodium 137 135 - 145 mmol/L   Potassium 4.0 3.5 - 5.1 mmol/L   Chloride 107 98 - 111 mmol/L   CO2 25 22 - 32 mmol/L   Glucose, Bld 174 (H) 70 - 99 mg/dL   BUN 22 8 - 23 mg/dL   Creatinine, Ser 0.95 0.61 - 1.24 mg/dL   Calcium 7.7 (L) 8.9 - 10.3 mg/dL   GFR, Estimated >60 >60 mL/min   Anion gap 5 5 - 15  CBC with Differential/Platelet     Status: Abnormal   Collection Time: 10/31/20  4:12 AM  Result Value Ref Range   WBC 17.7 (H) 4.0 - 10.5 K/uL   RBC 2.82 (L) 4.22 - 5.81 MIL/uL   Hemoglobin 7.9 (L) 13.0 - 17.0 g/dL   HCT 26.3 (L) 39.0 - 52.0 %   MCV 93.3 80.0 - 100.0 fL   MCH 28.0 26.0 - 34.0 pg   MCHC 30.0 30.0 - 36.0 g/dL   RDW 16.6 (H) 11.5 - 15.5 %   Platelets 341 150 - 400 K/uL   nRBC 0.2 0.0 - 0.2 %  Neutrophils Relative % 69 %   Neutro Abs 12.4 (H) 1.7 - 7.7 K/uL   Lymphocytes Relative 12 %   Lymphs Abs 2.1 0.7 - 4.0 K/uL   Monocytes Relative 6 %   Monocytes Absolute 1.0 0.1 - 1.0 K/uL   Eosinophils Relative 2 %   Eosinophils Absolute 0.3 0.0 - 0.5 K/uL   Basophils Relative 1 %   Basophils Absolute 0.1 0.0 - 0.1 K/uL   WBC Morphology MILD LEFT SHIFT (1-5% METAS, OCC MYELO, OCC BANDS)    Immature Granulocytes 10 %   Abs Immature Granulocytes 1.81 (H) 0.00 - 0.07 K/uL   Polychromasia PRESENT   Magnesium     Status: None   Collection Time: 10/31/20  4:12 AM  Result Value Ref Range   Magnesium 1.7 1.7 - 2.4 mg/dL  Glucose, capillary     Status: Abnormal   Collection Time: 10/31/20  7:55 AM  Result Value Ref Range   Glucose-Capillary 138 (H) 70 - 99 mg/dL   Comment 1 Notify RN    Comment 2 Document in Chart   C Difficile Quick Screen (NO PCR Reflex)     Status: None   Collection Time: 10/31/20  8:00 AM   Specimen: STOOL  Result Value Ref Range   C Diff antigen NEGATIVE NEGATIVE   C Diff toxin NEGATIVE NEGATIVE   C Diff  interpretation No C. difficile detected.   Glucose, capillary     Status: Abnormal   Collection Time: 10/31/20 11:47 AM  Result Value Ref Range   Glucose-Capillary 234 (H) 70 - 99 mg/dL   Comment 1 Notify RN    Comment 2 Document in Chart     Recent Results (from the past 240 hour(s))  Culture, blood (routine x 2)     Status: None   Collection Time: 10/24/20  7:08 PM   Specimen: BLOOD  Result Value Ref Range Status   Specimen Description   Final    BLOOD RIGHT ANTECUBITAL Performed at [facility] [address] [place], [place] [address]    Special Requests   Final    BOTTLES DRAWN AEROBIC AND ANAEROBIC Blood Culture adequate volume Performed at [facility] [address], [place], [place] [address]    Culture   Final    NO GROWTH 5 DAYS Performed at [facility], [place] [address], [place], [place] [address]    Report Status 10/30/2020 FINAL  Final  Culture, blood (routine x 2)     Status: None   Collection Time: 10/24/20  7:08 PM   Specimen: BLOOD  Result Value Ref Range Status   Specimen Description   Final    BLOOD LEFT ANTECUBITAL Performed at [facility] [address] [place], [place] [address]    Special Requests   Final    BOTTLES DRAWN AEROBIC AND ANAEROBIC Blood Culture results may not be optimal due to an inadequate volume of blood received in culture bottles Performed at [facility] [address], [place], [place] [address]    Culture   Final    NO GROWTH 5 DAYS Performed at [facility], [place] [address], [place], [place] [address]    Report Status 10/30/2020 FINAL  Final  C Difficile Quick Screen (NO PCR Reflex)     Status: None   Collection Time: 10/31/20  8:00 AM   Specimen: STOOL  Result Value Ref Range Status   C Diff antigen NEGATIVE NEGATIVE  Final   C Diff toxin NEGATIVE NEGATIVE Final   C Diff interpretation No C. difficile detected.  Final    Comment:  Performed at [facility], [place] [address], [place], [place] [address]     Radiology Studies: CT ABDOMEN PELVIS W CONTRAST  Result Date: 10/30/2020 CLINICAL DATA:  POD 6, S/P right colectomy with anastomosis, left colectomy with end colostomy, mobilization of splenic flexure 10/24/20 Dr. Thermon Chang for colonic obstruction secondary to sigmoid adenocarcinoma with 3/12 LN +, incidental neuroendocrine appendiceal tumor (resected at time of surgery) EXAM: CT ABDOMEN AND PELVIS WITH CONTRAST TECHNIQUE: Multidetector CT imaging of the abdomen and pelvis was performed using the standard protocol following bolus administration of intravenous contrast. CONTRAST:  163m OMNIPAQUE IOHEXOL 350 MG/ML SOLN COMPARISON:  CT abdomen pelvis 10/19/2020 FINDINGS: Lower chest: Trace right pleural effusion. Right lower lobe airspace opacity. Central venous catheter terminating at the superior cavoatrial junction. Coronary artery calcifications involving at least 3 vessels. Hepatobiliary: The hepatic parenchyma is diffusely hypodense compared to the splenic parenchyma consistent with fatty infiltration. No focal liver abnormality. Multiple calcified gallstones within the gallbladder lumen. No gallbladder wall thickening or pericholecystic fluid. No biliary dilatation. Pancreas: No focal lesion. Normal pancreatic contour. No surrounding inflammatory changes. No main pancreatic ductal dilatation. Spleen: Normal in size without focal abnormality. Adrenals/Urinary Tract: No adrenal nodule bilaterally. Bilateral kidneys enhance symmetrically. Several bilateral fluid density lesions within the kidneys likely represent simple renal cysts. Subcentimeter hypodensities are too small to characterize. No hydronephrosis. No hydroureter. The urinary bladder is unremarkable. Stomach/Bowel: Surgical changes related to a Hartmann pouch formation (2:74) and partial left colectomy, partial right colectomy status post ileocolonic  anastomosis (2:59), left lower quadrant end colostomy formation (2:63). Partial opacification of the terminal ileum and transverse colon in the right abdomen. No PO contrast extravasation noted on the portal venous or delayed phase. Stomach is within normal limits. No evidence of small bowel wall thickening or dilatation. Proximal transverse colon bowel wall thickening and mild pericolonic fat stranding. Vascular/Lymphatic: No abdominal aorta or iliac aneurysm. Mild atherosclerotic plaque of the aorta and its branches. No abdominal, pelvic, or inguinal lymphadenopathy. Reproductive: Prostate is unremarkable. Other: Trace to small volume free fluid within the right abdomen with a couple of tiny foci of associated free gas (2: 47-60). No organized fluid collection. Right surgical drain with tip terminating in the pelvis. Musculoskeletal: Healing anterior abdominal incision. No hernia formation. Mild subcutaneus soft tissue edema. No suspicious lytic or blastic osseous lesions. No acute displaced fracture. Multilevel degenerative changes of the spine. IMPRESSION: 1. Inflammatory changes of the proximal transverse colon just distal to the side-to-side ileocolonic anastomosis. 2. Trace to small volume free fluid within the right abdomen may be postsurgical versus inflammatory. Couple tiny foci of gas within the right abdomen are likely postsurgical in etiology. 3. No definite findings of bowel leak or anastomotic suture dehiscence in a patient status post partial right colectomy with ileocolonic side-to-side anastomosis, partial left colectomy, and left lower quadrant end colostomy with Hartmann pouch formation. 4. Cholelithiasis with no CT findings to suggest acute cholecystitis. 5.  Aortic Atherosclerosis (ICD10-I70.0). Electronically Signed   By: MIven FinnM.D.   On: 10/30/2020 17:55   CT ABDOMEN PELVIS W CONTRAST  Final Result    DG CHEST PORT 1 VIEW  Final Result    [place]KG SITE RITE  Final Result     CT HEAD WO CONTRAST  Final Result    DG Abd 1 View  Final Result  DG CHEST PORT 1 VIEW  Final Result    DG Abd Portable 1V  Final Result    DG Chest Port 1 View  Final Result  Addendum (preliminary) 1 of 1  ADDENDUM REPORT: 10/24/2020 10:05    ADDENDUM:  Free air discussed by telephone with Dr. Eddie Dibbles STECHSCHULTE on  10/24/2020 at 1000 hours.      Electronically Signed    By: Genevie Ann M.D.    On: 10/24/2020 10:05      Final    DG Abd Portable 1V  Final Result    CT ABDOMEN PELVIS WO CONTRAST  Final Result    DG Chest Port 1 View  Final Result      Scheduled Meds:  (feeding supplement) PROSource Plus  30 mL Oral BID BM   amiodarone  200 mg Oral BID   amLODipine  5 mg Oral Daily   carvedilol  6.25 mg Oral BID WC   Chlorhexidine Gluconate Cloth  6 each Topical Daily   docusate sodium  100 mg Oral BID   enoxaparin (LOVENOX) injection  40 mg Subcutaneous Q24H   feeding supplement  237 mL Oral TID BM   insulin aspart  0-20 Units Subcutaneous TID WC   insulin aspart  0-5 Units Subcutaneous QHS   insulin aspart  5 Units Subcutaneous TID WC   insulin glargine-yfgn  40 Units Subcutaneous QHS   lisinopril  40 mg Oral Daily   loperamide  2 mg Oral BID   mouth rinse  15 mL Mouth Rinse BID   multivitamin with minerals  1 tablet Oral Daily   pneumococcal 23 valent vaccine  0.5 mL Intramuscular Tomorrow-1000   polycarbophil  625 mg Oral BID   sodium chloride flush  10-40 mL Intracatheter Q12H   sodium chloride flush  10-40 mL Intracatheter Q12H   PRN Meds: doxylamine (Sleep), fentaNYL (SUBLIMAZE) injection, hydrALAZINE, metoprolol tartrate, [DISCONTINUED] ondansetron **OR** ondansetron (ZOFRAN) IV, oxyCODONE, polyvinyl alcohol, sodium chloride flush, sodium chloride flush Continuous Infusions:  sodium chloride 10 mL/hr at 10/29/20 1220     LOS: 11 days  Time spent: Greater than 50% of the 35 minute visit was spent in counseling/coordination of care for the patient  as laid out in the A&P.   Dwyane Dee, MD [facility] 10/31/2020, 2:16 PM

## 2020-10-31 NOTE — Assessment & Plan Note (Addendum)
-   stool output very liquidy on 8/4; cdiff checked and negative on 8/4 - continue imodium and fibercon - see perforated viscous

## 2020-11-01 DIAGNOSIS — R198 Other specified symptoms and signs involving the digestive system and abdomen: Secondary | ICD-10-CM | POA: Diagnosis not present

## 2020-11-01 LAB — CBC WITH DIFFERENTIAL/PLATELET
Abs Immature Granulocytes: 1.42 10*3/uL — ABNORMAL HIGH (ref 0.00–0.07)
Basophils Absolute: 0.1 10*3/uL (ref 0.0–0.1)
Basophils Relative: 1 %
Eosinophils Absolute: 0.2 10*3/uL (ref 0.0–0.5)
Eosinophils Relative: 1 %
HCT: 26.6 % — ABNORMAL LOW (ref 39.0–52.0)
Hemoglobin: 8 g/dL — ABNORMAL LOW (ref 13.0–17.0)
Immature Granulocytes: 9 %
Lymphocytes Relative: 15 %
Lymphs Abs: 2.5 10*3/uL (ref 0.7–4.0)
MCH: 28.1 pg (ref 26.0–34.0)
MCHC: 30.1 g/dL (ref 30.0–36.0)
MCV: 93.3 fL (ref 80.0–100.0)
Monocytes Absolute: 1 10*3/uL (ref 0.1–1.0)
Monocytes Relative: 6 %
Neutro Abs: 11.3 10*3/uL — ABNORMAL HIGH (ref 1.7–7.7)
Neutrophils Relative %: 68 %
Platelets: 419 10*3/uL — ABNORMAL HIGH (ref 150–400)
RBC: 2.85 MIL/uL — ABNORMAL LOW (ref 4.22–5.81)
RDW: 16.3 % — ABNORMAL HIGH (ref 11.5–15.5)
WBC: 16.5 10*3/uL — ABNORMAL HIGH (ref 4.0–10.5)
nRBC: 0.2 % (ref 0.0–0.2)

## 2020-11-01 LAB — BASIC METABOLIC PANEL
Anion gap: 3 — ABNORMAL LOW (ref 5–15)
BUN: 21 mg/dL (ref 8–23)
CO2: 24 mmol/L (ref 22–32)
Calcium: 7.5 mg/dL — ABNORMAL LOW (ref 8.9–10.3)
Chloride: 114 mmol/L — ABNORMAL HIGH (ref 98–111)
Creatinine, Ser: 1.03 mg/dL (ref 0.61–1.24)
GFR, Estimated: >60 mL/min (ref >60)
Glucose, Bld: 158 mg/dL — ABNORMAL HIGH (ref 70–99)
Potassium: 4.1 mmol/L (ref 3.5–5.1)
Sodium: 141 mmol/L (ref 135–145)

## 2020-11-01 LAB — GLUCOSE, CAPILLARY
Glucose-Capillary: 145 mg/dL — ABNORMAL HIGH (ref 70–99)
Glucose-Capillary: 196 mg/dL — ABNORMAL HIGH (ref 70–99)
Glucose-Capillary: 150 mg/dL — ABNORMAL HIGH (ref 70–99)
Glucose-Capillary: 175 mg/dL — ABNORMAL HIGH (ref 70–99)

## 2020-11-01 LAB — SURGICAL PATHOLOGY

## 2020-11-01 LAB — MAGNESIUM: Magnesium: 1.8 mg/dL (ref 1.7–2.4)

## 2020-11-01 MED ORDER — LOPERAMIDE HCL 2 MG PO CAPS
2.0000 mg | ORAL_CAPSULE | Freq: Three times a day (TID) | ORAL | Status: DC
  Administered 2020-11-01 – 2020-11-06 (×15): 2 mg via ORAL
  Filled 2020-11-01 (×15): qty 1

## 2020-11-01 NOTE — Progress Notes (Signed)
Nutrition Follow-up  DOCUMENTATION CODES:   Morbid obesity  INTERVENTION:   -Ensure Enlive po BID, each supplement provides 350 kcal and 20 grams of protein   -Prosource Plus PO BID, each provides 100 kcals and 15g protein  -Multivitamin with minerals daily  NUTRITION DIAGNOSIS:   Increased nutrient needs related to post-op healing, acute illness as evidenced by estimated needs.  GOAL:   Patient will meet greater than or equal to 90% of their needs  MONITOR:   PO intake, Supplement acceptance, Labs, Diet advancement, Weight trends, I & O's, Skin  ASSESSMENT:   75 years old male with PMH significant for DM2, HTN, intellectual disability, CKD stage IIIa presented in the ED with c/o: abdominal pain, diarrhea and hypotension.  As per skilled nursing staff patient reports having abdominal pain for the last couple of weeks.  Per surgery, pt with colon obstruction.  7/23: admitted 7/28: s/p right colectomy, left colectomy with end colostomy, intubated 7/29: extubated, NGT in placed set to suction  Patient currently consuming 100% of meals. Accepting Ensure and Prosource supplements.   Admission weight: 269 lbs. Last recorded weight 7/31: 258 lbs.  I/Os: +486 ml UOP: 2250 ml x 24 hrs Drains: 460 ml Colostomy: 1835 ml  Medications: Imodium, Multivitamin with minerals daily, Fibercon  Labs reviewed:   CBGs: 145-257  Diet Order:   Diet Order             DIET SOFT Room service appropriate? Yes; Fluid consistency: Thin  Diet effective now                   EDUCATION NEEDS:   No education needs have been identified at this time  Skin:  Skin Assessment: Skin Integrity Issues: Skin Integrity Issues:: Incisions Incisions: 7/28 abdomen  Last BM:  8/5 -colostomy  Height:   Ht Readings from Last 1 Encounters:  10/27/20 5' 7.99" (1.727 m)    Weight:   Wt Readings from Last 1 Encounters:  10/27/20 117.3 kg    BMI:  Body mass index is 39.33  kg/m.  Estimated Nutritional Needs:   Kcal:  2300-2500  Protein:  105-120g  Fluid:  2L/day   Clayton Bibles, MS, RD, LDN Inpatient Clinical Dietitian Contact information available via Amion

## 2020-11-01 NOTE — Progress Notes (Signed)
Progress Note    Mark Chang   ZTI:[id number]  DOB: [birth year]  DOA: 10/19/2020     12  PCP: Terrill Mohr, NP  CC: abdominal pain  Hospital Course: Mark Chang is a 75 yo male with PMH type 2 diabetes mellitus, essential hypertension, CKD stage IIIa, intellectual disability who resides at SNF who initially presented to [facility] ED with complaint of abdominal pain, diarrhea and was found to be hypotensive.  Per SNF staff, he reported abdominal pain for the last few weeks.     In the ED, he was found to have a blood pressure of 68/40, temperature 100.5 F, HR 105, lactic acid 2.2, WBC count 22.7.   CT abdomen/pelvis showed minimal amount of free air, cecal mass probably sigmoid colon with concern for perforated viscus.  GI and general surgery were consulted.  Patient admitted for management of abdominal pain secondary to cecal mass with likely perforated viscus.  Patient was initially monitored with conservative management, could not get a hold of family or legal guardian.  However, patient continued to clinically decline and patient ultimately underwent right colectomy with anastomosis, left colectomy with end colostomy by [name] on 7/28.  Postoperatively, patient remained in respiratory acidosis and was left on mechanical ventilation, required pressors.  He was transferred to ICU.  He was extubated 7/29 and transferred back to [facility] service on 7/30.  TPN was started 7/31.  Pathology from surgery returned.  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  Interval History:  No events overnight.  Sitting up in chair bedside this morning.  Denies any pain or discomfort in general. Still having ongoing liquid stool output in ostomy  bag.  ROS: Constitutional: negative for chills and fevers, Respiratory: negative for cough and sputum, Cardiovascular: negative for chest pain, and Gastrointestinal: negative for abdominal pain  Assessment & Plan: * Perforated abdominal viscus -Status post right colectomy with anastomosis, left colectomy with end colostomy by [name] 7/28 -s/p TPN; weaned off on 10/29/20 - tolerating soft diet -General surgery following - leukocytosis still present on 8/3; CT A/P ordered per surgery and negative for abscess - per surgery, once stool output <1000 cc, would be stable for d/c (last 24 hrs ~1800 cc)  Septic shock (HCC)-resolved as of 10/30/2020 - s/p ICU with pressors from hypovolemic shock - now improved and resovled  Diarrhea - stool output very liquidy on 8/4; cdiff checked and negative on 8/4 - continue imodium and fibercon - see perforated viscous   Colon obstruction ([place]) - per surgical pathology: 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 - discussed with oncology; he can have outpatient followup once healed from surgery; needs CT chest at some point to complete staging prior to discharge - patient has IDD and unable to contact brother or POA; SW/CM assisting with learning who can help patient make medical decisions on next steps too  Intellectual disability - was at [employer]; tentative plan is for [facility] in [place] - pending POA help for MDM  DM (diabetes mellitus), type 2 with renal complications ([place]) - continue SSI and CBG monitoring   Acute renal failure superimposed on stage 3a chronic kidney disease (HCC)-resolved as of 10/30/2020 - patient has  history of CKD3a. Baseline creat ~ 1.3, eGFR 48 - patient presents with increase in creat >0.3 mg/dL above baseline, creat increase >1.5x baseline presumed to have occurred within past 7  days PTA - peaked at 3.92 - resolved with IVF and resuscitation   Acute respiratory failure with hypoxia (HCC)-resolved as of 10/30/2020 - s/p intubation - now on RA   Old records reviewed in assessment of this patient  Antimicrobials:   DVT prophylaxis: enoxaparin (LOVENOX) injection 40 mg Start: 10/25/20 1400 Place and maintain sequential compression device Start: 10/22/20 0852 SCDs Start: 10/20/20 0026   Code Status:   Code Status: Full Code Family Communication:   Disposition Plan: Status is: Inpatient  Remains inpatient appropriate because:IV treatments appropriate due to intensity of illness or inability to take PO and Inpatient level of care appropriate due to severity of illness  Dispo: The patient is from: SNF              Anticipated d/c is to: SNF              Patient currently is not medically stable to d/c.   Difficult to place patient No Risk of unplanned readmission score: Unplanned Admission- Pilot do not use: 19.04   Objective: Blood pressure (!) 111/49, pulse 91, temperature 98.6 F (37 C), resp. rate 18, height 5' 7.99" (1.727 m), weight 117.3 kg, SpO2 99 %.  Examination: General appearance: alert, cooperative, no distress, and slowed mentation Head: Normocephalic, without obvious abnormality, atraumatic Eyes:  EOMI Lungs: clear to auscultation bilaterally Heart: regular rate and rhythm and S1, S2 normal Abdomen:  ostomy bag in place with increased amount of liquid brown stool. Penrose drain noted in lower abd incision; 25 staples in place Extremities:  no edema Skin: mobility and turgor normal Neurologic: IDD noted; no other focal deficits  Consultants:  Surgery  Procedures:    Data Reviewed: I have personally reviewed following labs and imaging studies Results for orders placed or performed during the hospital encounter of 10/19/20 (from the past 24 hour(s))  Glucose, capillary     Status: Abnormal   Collection Time: 10/31/20  4:20 PM   Result Value Ref Range   Glucose-Capillary 257 (H) 70 - 99 mg/dL   Comment 1 Notify RN    Comment 2 Document in Chart   Glucose, capillary     Status: Abnormal   Collection Time: 10/31/20  9:57 PM  Result Value Ref Range   Glucose-Capillary 200 (H) 70 - 99 mg/dL  Basic metabolic panel     Status: Abnormal   Collection Time: 11/01/20  3:40 AM  Result Value Ref Range   Sodium 141 135 - 145 mmol/L   Potassium 4.1 3.5 - 5.1 mmol/L   Chloride 114 (H) 98 - 111 mmol/L   CO2 24 22 - 32 mmol/L   Glucose, Bld 158 (H) 70 - 99 mg/dL   BUN 21 8 - 23 mg/dL   Creatinine, Ser 1.03 0.61 - 1.24 mg/dL   Calcium 7.5 (L) 8.9 - 10.3 mg/dL   GFR, Estimated >60 >60 mL/min   Anion gap 3 (L) 5 - 15  CBC with Differential/Platelet     Status: Abnormal   Collection Time: 11/01/20  3:40 AM  Result Value Ref Range   WBC 16.5 (H) 4.0 - 10.5 K/uL   RBC 2.85 (L) 4.22 - 5.81 MIL/uL   Hemoglobin 8.0 (L) 13.0 - 17.0 g/dL   HCT 26.6 (L) 39.0 - 52.0 %   MCV 93.3 80.0 -  100.0 fL   MCH 28.1 26.0 - 34.0 pg   MCHC 30.1 30.0 - 36.0 g/dL   RDW 16.3 (H) 11.5 - 15.5 %   Platelets 419 (H) 150 - 400 K/uL   nRBC 0.2 0.0 - 0.2 %   Neutrophils Relative % 68 %   Neutro Abs 11.3 (H) 1.7 - 7.7 K/uL   Lymphocytes Relative 15 %   Lymphs Abs 2.5 0.7 - 4.0 K/uL   Monocytes Relative 6 %   Monocytes Absolute 1.0 0.1 - 1.0 K/uL   Eosinophils Relative 1 %   Eosinophils Absolute 0.2 0.0 - 0.5 K/uL   Basophils Relative 1 %   Basophils Absolute 0.1 0.0 - 0.1 K/uL   WBC Morphology MILD LEFT SHIFT (1-5% METAS, OCC MYELO, OCC BANDS)    Immature Granulocytes 9 %   Abs Immature Granulocytes 1.42 (H) 0.00 - 0.07 K/uL   [place] PRESENT    Polychromasia PRESENT   Magnesium     Status: None   Collection Time: 11/01/20  3:40 AM  Result Value Ref Range   Magnesium 1.8 1.7 - 2.4 mg/dL  Glucose, capillary     Status: Abnormal   Collection Time: 11/01/20  7:48 AM  Result Value Ref Range   Glucose-Capillary 145 (H) 70 - 99 mg/dL   Glucose, capillary     Status: Abnormal   Collection Time: 11/01/20 12:02 PM  Result Value Ref Range   Glucose-Capillary 196 (H) 70 - 99 mg/dL    Recent Results (from the past 240 hour(s))  Culture, blood (routine x 2)     Status: None   Collection Time: 10/24/20  7:08 PM   Specimen: BLOOD  Result Value Ref Range Status   Specimen Description   Final    BLOOD RIGHT ANTECUBITAL Performed at [facility], [place] [address], [place], [place] [address]    Special Requests   Final    BOTTLES DRAWN AEROBIC AND ANAEROBIC Blood Culture adequate volume Performed at [facility] [address] [place], [place] [address]    Culture   Final    NO GROWTH 5 DAYS Performed at [facility], [place] [address] [place], [place] [address]    Report Status 10/30/2020 FINAL  Final  Culture, blood (routine x 2)     Status: None   Collection Time: 10/24/20  7:08 PM   Specimen: BLOOD  Result Value Ref Range Status   Specimen Description   Final    BLOOD LEFT ANTECUBITAL Performed at [facility] [address], [place], [place] [address]    Special Requests   Final    BOTTLES DRAWN AEROBIC AND ANAEROBIC Blood Culture results may not be optimal due to an inadequate volume of blood received in culture bottles Performed at [facility] [address], [place], [place] [address]    Culture   Final    NO GROWTH 5 DAYS Performed at [facility], [place] [address], [place],  [address]    Report Status 10/30/2020 FINAL  Final  C Difficile Quick Screen (NO PCR Reflex)     Status: None   Collection Time: 10/31/20  8:00 AM   Specimen: STOOL  Result Value Ref Range Status   C Diff antigen NEGATIVE NEGATIVE Final   C Diff toxin NEGATIVE NEGATIVE Final   C Diff interpretation No C. difficile detected.  Final    Comment: Performed at [facility], [place] [address]  [place], [place] [address]      Radiology Studies: CT ABDOMEN PELVIS W CONTRAST  Result Date: 10/30/2020 CLINICAL DATA:  POD 6, S/P right colectomy with anastomosis, left colectomy with end colostomy, mobilization of splenic flexure 10/24/20 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 CONTRAST:  141m OMNIPAQUE IOHEXOL 350 MG/ML SOLN COMPARISON:  CT abdomen pelvis 10/19/2020 FINDINGS: Lower chest: Trace right pleural effusion. Right lower lobe airspace opacity. Central venous catheter terminating at the superior cavoatrial junction. Coronary artery calcifications involving at least 3 vessels. Hepatobiliary: The hepatic parenchyma is diffusely hypodense compared to the splenic parenchyma consistent with fatty infiltration. No focal liver abnormality. Multiple calcified gallstones within the gallbladder lumen. No gallbladder wall thickening or pericholecystic fluid. No biliary dilatation. Pancreas: No focal lesion. Normal pancreatic contour. No surrounding inflammatory changes. No main pancreatic ductal dilatation. Spleen: Normal in size without focal abnormality. Adrenals/Urinary Tract: No adrenal nodule bilaterally. Bilateral kidneys enhance symmetrically. Several bilateral fluid density lesions within the kidneys likely represent simple renal cysts. Subcentimeter hypodensities are too small to characterize. No hydronephrosis. No hydroureter. The urinary bladder is unremarkable. Stomach/Bowel: Surgical changes related to a Hartmann pouch formation (2:74) and partial left colectomy, partial right colectomy status post ileocolonic anastomosis (2:59), left lower quadrant end colostomy formation (2:63). Partial opacification of  the terminal ileum and transverse colon in the right abdomen. No PO contrast extravasation noted on the portal venous or delayed phase. Stomach is within normal limits. No evidence of small bowel wall thickening or dilatation. Proximal transverse colon bowel wall thickening and mild pericolonic fat stranding. Vascular/Lymphatic: No abdominal aorta or iliac aneurysm. Mild atherosclerotic plaque of the aorta and its branches. No abdominal, pelvic, or inguinal lymphadenopathy. Reproductive: Prostate is unremarkable. Other: Trace to small volume free fluid within the right abdomen with a couple of tiny foci of associated free gas (2: 47-60). No organized fluid collection. Right surgical drain with tip terminating in the pelvis. Musculoskeletal: Healing anterior abdominal incision. No hernia formation. Mild subcutaneus soft tissue edema. No suspicious lytic or blastic osseous lesions. No acute displaced fracture. Multilevel degenerative changes of the spine. IMPRESSION: 1. Inflammatory changes of the proximal transverse colon just distal to the side-to-side ileocolonic anastomosis. 2. Trace to small volume free fluid within the right abdomen may be postsurgical versus inflammatory. Couple tiny foci of gas within the right abdomen are likely postsurgical in etiology. 3. No definite findings of bowel leak or anastomotic suture dehiscence in a patient status post partial right colectomy with ileocolonic side-to-side anastomosis, partial left colectomy, and left lower quadrant end colostomy with Hartmann pouch formation. 4. Cholelithiasis with no CT findings to suggest acute cholecystitis. 5.  Aortic Atherosclerosis (ICD10-I70.0). Electronically Signed   By: MIven FinnM.D.   On: 10/30/2020 17:55   CT ABDOMEN PELVIS W CONTRAST  Final Result    DG CHEST PORT 1 VIEW  Final Result    [place]KG SITE RITE  Final Result    CT HEAD WO CONTRAST  Final Result    DG Abd 1 View  Final Result    DG CHEST PORT 1 VIEW   Final Result    DG Abd Portable 1V  Final Result    DG Chest Port 1 View  Final Result  Addendum (  preliminary) 1 of 1  ADDENDUM REPORT: 10/24/2020 10:05    ADDENDUM:  Free air discussed by telephone with Dr. Eddie Dibbles STECHSCHULTE on  10/24/2020 at 1000 hours.      Electronically Signed    By: Genevie Ann M.D.    On: 10/24/2020 10:05      Final    DG Abd Portable 1V  Final Result    CT ABDOMEN PELVIS WO CONTRAST  Final Result    DG Chest Port 1 View  Final Result      Scheduled Meds:  (feeding supplement) PROSource Plus  30 mL Oral BID BM   amiodarone  200 mg Oral BID   amLODipine  5 mg Oral Daily   carvedilol  6.25 mg Oral BID WC   Chlorhexidine Gluconate Cloth  6 each Topical Daily   docusate sodium  100 mg Oral BID   enoxaparin (LOVENOX) injection  40 mg Subcutaneous Q24H   feeding supplement  237 mL Oral TID BM   insulin aspart  0-20 Units Subcutaneous TID WC   insulin aspart  0-5 Units Subcutaneous QHS   insulin aspart  5 Units Subcutaneous TID WC   insulin glargine-yfgn  40 Units Subcutaneous QHS   lisinopril  40 mg Oral Daily   loperamide  2 mg Oral TID   mouth rinse  15 mL Mouth Rinse BID   multivitamin with minerals  1 tablet Oral Daily   pneumococcal 23 valent vaccine  0.5 mL Intramuscular Tomorrow-1000   polycarbophil  625 mg Oral BID   sodium chloride flush  10-40 mL Intracatheter Q12H   sodium chloride flush  10-40 mL Intracatheter Q12H   PRN Meds: doxylamine (Sleep), fentaNYL (SUBLIMAZE) injection, hydrALAZINE, metoprolol tartrate, [DISCONTINUED] ondansetron **OR** ondansetron (ZOFRAN) IV, oxyCODONE, polyvinyl alcohol, sodium chloride flush, sodium chloride flush Continuous Infusions:  sodium chloride Stopped (10/31/20 1031)     LOS: 12 days  Time spent: Greater than 50% of the 35 minute visit was spent in counseling/coordination of care for the patient as laid out in the A&P.   Dwyane Dee, MD [facility] 11/01/2020, 3:27 PM

## 2020-11-01 NOTE — Progress Notes (Signed)
Progress Note  8 Days Post-Op  Subjective: Patient denies abdominal pain or nausea. Tolerating diet well. Up on bedside working with therapies  Objective: Vital signs in last 24 hours: Temp:  [97.3 F (36.3 C)-100.1 F (37.8 C)] 98.8 F (37.1 C) (08/05 0358) Pulse Rate:  [85-107] 95 (08/05 0911) Resp:  [20-32] 20 (08/05 0358) BP: (106-142)/(41-55) 111/53 (08/05 0911) SpO2:  [94 %-100 %] 95 % (08/05 0358) Last BM Date: 10/30/20 (colostomy pouch)  Intake/Output from previous day: 08/04 0701 - 08/05 0700 In: 465.8 [I.V.:465.8] Out: 4545 [Urine:2250; Drains:460; Stool:1835] Intake/Output this shift: Total I/O In: 200 [P.O.:200] Out: -   PE: Gen: NAD Abd: soft, doesn't seem tender, colostomy pouch just emptied but with 1800cc yesterday. stoma is pink and viable.  Midline incision is c/d/I with staples present as well as penrose.  JP with serous output 460cc/24hrs   Lab Results:  Recent Labs    10/31/20 0412 11/01/20 0340  WBC 17.7* 16.5*  HGB 7.9* 8.0*  HCT 26.3* 26.6*  PLT 341 419*   BMET Recent Labs    10/31/20 0412 11/01/20 0340  NA 137 141  K 4.0 4.1  CL 107 114*  CO2 25 24  GLUCOSE 174* 158*  BUN 22 21  CREATININE 0.95 1.03  CALCIUM 7.7* 7.5*   PT/INR No results for input(s): LABPROT, INR in the last 72 hours. CMP     Component Value Date/Time   NA 141 11/01/2020 0340   K 4.1 11/01/2020 0340   CL 114 (H) 11/01/2020 0340   CO2 24 11/01/2020 0340   GLUCOSE 158 (H) 11/01/2020 0340   BUN 21 11/01/2020 0340   CREATININE 1.03 11/01/2020 0340   CALCIUM 7.5 (L) 11/01/2020 0340   PROT 5.3 (L) 10/28/2020 0456   ALBUMIN 1.7 (L) 10/28/2020 0456   AST 20 10/28/2020 0456   ALT 18 10/28/2020 0456   ALKPHOS 53 10/28/2020 0456   BILITOT 0.4 10/28/2020 0456   GFRNONAA >60 11/01/2020 0340   GFRAA >60 03/24/2017 0238   Lipase  No results found for: LIPASE     Studies/Results: CT ABDOMEN PELVIS W CONTRAST  Result Date: 10/30/2020 CLINICAL DATA:   POD 6, S/P right colectomy with anastomosis, left colectomy with end colostomy, mobilization of splenic flexure 10/24/20 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 CONTRAST:  155m OMNIPAQUE IOHEXOL 350 MG/ML SOLN COMPARISON:  CT abdomen pelvis 10/19/2020 FINDINGS: Lower chest: Trace right pleural effusion. Right lower lobe airspace opacity. Central venous catheter terminating at the superior cavoatrial junction. Coronary artery calcifications involving at least 3 vessels. Hepatobiliary: The hepatic parenchyma is diffusely hypodense compared to the splenic parenchyma consistent with fatty infiltration. No focal liver abnormality. Multiple calcified gallstones within the gallbladder lumen. No gallbladder wall thickening or pericholecystic fluid. No biliary dilatation. Pancreas: No focal lesion. Normal pancreatic contour. No surrounding inflammatory changes. No main pancreatic ductal dilatation. Spleen: Normal in size without focal abnormality. Adrenals/Urinary Tract: No adrenal nodule bilaterally. Bilateral kidneys enhance symmetrically. Several bilateral fluid density lesions within the kidneys likely represent simple renal cysts. Subcentimeter hypodensities are too small to characterize. No hydronephrosis. No hydroureter. The urinary bladder is unremarkable. Stomach/Bowel: Surgical changes related to a Hartmann pouch formation (2:74) and partial left colectomy, partial right colectomy status  post ileocolonic anastomosis (2:59), left lower quadrant end colostomy formation (2:63). Partial opacification of the terminal ileum and transverse colon in the right abdomen. No PO contrast extravasation  noted on the portal venous or delayed phase. Stomach is within normal limits. No evidence of small bowel wall thickening or dilatation. Proximal transverse colon bowel wall thickening and mild pericolonic fat stranding. Vascular/Lymphatic: No abdominal aorta or iliac aneurysm. Mild atherosclerotic plaque of the aorta and its branches. No abdominal, pelvic, or inguinal lymphadenopathy. Reproductive: Prostate is unremarkable. Other: Trace to small volume free fluid within the right abdomen with a couple of tiny foci of associated free gas (2: 47-60). No organized fluid collection. Right surgical drain with tip terminating in the pelvis. Musculoskeletal: Healing anterior abdominal incision. No hernia formation. Mild subcutaneus soft tissue edema. No suspicious lytic or blastic osseous lesions. No acute displaced fracture. Multilevel degenerative changes of the spine. IMPRESSION: 1. Inflammatory changes of the proximal transverse colon just distal to the side-to-side ileocolonic anastomosis. 2. Trace to small volume free fluid within the right abdomen may be postsurgical versus inflammatory. Couple tiny foci of gas within the right abdomen are likely postsurgical in etiology. 3. No definite findings of bowel leak or anastomotic suture dehiscence in a patient status post partial right colectomy with ileocolonic side-to-side anastomosis, partial left colectomy, and left lower quadrant end colostomy with Hartmann pouch formation. 4. Cholelithiasis with no CT findings to suggest acute cholecystitis. 5.  Aortic Atherosclerosis (ICD10-I70.0). Electronically Signed   By: Iven Finn M.D.   On: 10/30/2020 17:55    Anti-infectives: Anti-infectives (From admission, onward)    Start     Dose/Rate Route Frequency Ordered Stop   10/27/20 1400  ceFEPIme (MAXIPIME) 2 g in sodium chloride 0.9 % 100 mL IVPB        2 g 200 mL/hr over 30 Minutes Intravenous Every 8 hours 10/27/20 0927 10/27/20 2250   10/25/20 1400  ceFEPIme  (MAXIPIME) 2 g in sodium chloride 0.9 % 100 mL IVPB  Status:  Discontinued        2 g 200 mL/hr over 30 Minutes Intravenous Every 12 hours 10/25/20 0729 10/27/20 0927   10/23/20 1000  ceFEPIme (MAXIPIME) 2 g in sodium chloride 0.9 % 100 mL IVPB  Status:  Discontinued        2 g 200 mL/hr over 30 Minutes Intravenous Every 8 hours 10/23/20 0833 10/25/20 0729   10/21/20 1000  ceFEPIme (MAXIPIME) 2 g in sodium chloride 0.9 % 100 mL IVPB  Status:  Discontinued        2 g 200 mL/hr over 30 Minutes Intravenous Every 12 hours 10/21/20 0742 10/23/20 0833   10/21/20 0600  metroNIDAZOLE (FLAGYL) IVPB 500 mg        500 mg 100 mL/hr over 60 Minutes Intravenous Every 8 hours 10/20/20 0028 10/28/20 0759   10/20/20 2200  ceFEPIme (MAXIPIME) 2 g in sodium chloride 0.9 % 100 mL IVPB  Status:  Discontinued        2 g 200 mL/hr over 30 Minutes Intravenous Every 24 hours 10/20/20 0048 10/21/20 0742   10/19/20 2030  ceFEPIme (MAXIPIME) 2 g in sodium chloride 0.9 % 100 mL IVPB        2 g 200 mL/hr over 30 Minutes Intravenous  Once 10/19/20 2029 10/20/20 0033   10/19/20 2030  metroNIDAZOLE (FLAGYL) IVPB 500 mg        500 mg 100 mL/hr over 60 Minutes Intravenous  Once 10/19/20 2029 10/19/20 2210  Assessment/Plan POD 8, S/P right colectomy with anastomosis, left colectomy with end colostomy, mobilization of splenic flexure 10/24/20 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  Fiber BID. Imodium '2mg'$  BID, increase to TID today -CT with no infectious findings necessarily to explain WBC, which is trending down on it's own with no abx - pelvic drain is SS, can likely DC soon. - surgical path shows 2 cancers.  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  Left colon is  invasive moderately differentiated adenocarcinoma with involvement in surrounding soft tissue and 3/12 LN + -onc to arrange outpatient follow up - ontinue nutritional supplements - mobilize - WOC following  Severe protein calorie malnutrition - prealbumin 7.6, PO intake improving   FEN: SOFT diet, ensure, imodium TID, fiber BID VTE: LMWH ID: [id number] 7/23>8/1; no need for more abx at this time. Monitor WBC   T2DM HTN CKD stage III Intellectual disability residing at [facility]  LOS: 12 days    Henreitta Cea, [facility] 11/01/2020, 11:34 AM Please see Amion for pager number during day hours 7:00am-4:30pm

## 2020-11-01 NOTE — Progress Notes (Signed)
Occupational Therapy Treatment Patient Details Name: Mark Chang MRN: [id number] DOB: [birth year] Today's Date: 11/01/2020    History of present illness 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 Patient developed free air and required a right colectomy with anastomosis and left colectomy with end colostomy on 10/24/2020.   OT comments  Treatment focused on functional mobility and ADL task out of bed. Improved ability to stand today with use of stedy. RUE weakness limits his ability to compensate with his arms. Patient reports weakness in his right arm is "a little better." Patient making progress slowly. Continue to recommend short term rehab at discharge.   Follow Up Recommendations  SNF    Equipment Recommendations  None recommended by OT    Recommendations for Other Services      Precautions / Restrictions Precautions Precautions: Other (comment) Precaution Comments: abdominal incision, colostomy, JP drain, heavy R lean Restrictions Weight Bearing Restrictions: No       Mobility Bed Mobility Overal bed mobility: Needs Assistance Bed Mobility: Supine to Sit     Supine to sit: Mod assist;HOB elevated     General bed mobility comments: mod assist to transfer into sitting - mostly for trunk negotiation. Patient able to scoot to edge of bed.    Transfers Overall transfer level: Needs assistance   Transfers: Sit to/from Stand;Stand Pivot Transfers Sit to Stand: Min assist;From elevated surface Stand pivot transfers: Total assist       General transfer comment: Min assist x 2 to stand from edge of bed using stedy. Unable to maintain stand longer than 10 seconds. RUE weakness limiting upper extremity support. Stood again and transferred to recilner with stedy. Exhibited difficulty controlling descent into recliner.    Balance Overall balance assessment: Needs  assistance Sitting-balance support: Single extremity supported Sitting balance-Leahy Scale: Poor Sitting balance - Comments: props with UEs   Standing balance support: During functional activity Standing balance-Leahy Scale: Poor                             ADL either performed or assessed with clinical judgement   ADL Overall ADL's : Needs assistance/impaired     Grooming: Set up;Sitting;Wash/dry face;Oral care Grooming Details (indicate cue type and reason): up in recliner                                     Vision Baseline Vision/History: No visual deficits Vision Assessment?: No apparent visual deficits   Perception     Praxis      Cognition Arousal/Alertness: Awake/alert Behavior During Therapy: WFL for tasks assessed/performed Overall Cognitive Status: History of cognitive impairments - at baseline                                          Exercises     Shoulder Instructions       General Comments      Pertinent Vitals/ Pain       Pain Assessment: No/denies pain  Home Living  Prior Functioning/Environment              Frequency  Min 2X/week        Progress Toward Goals  OT Goals(current goals can now be found in the care plan section)  Progress towards OT goals: Progressing toward goals  Acute Rehab OT Goals Patient Stated Goal: Get up to chair OT Goal Formulation: With patient Time For Goal Achievement: 11/09/20 Potential to Achieve Goals: Good  Plan Discharge plan remains appropriate    Co-evaluation          OT goals addressed during session: ADL's and self-care;Other (comment) (functional mobility)      AM-PAC OT "6 Clicks" Daily Activity     Outcome Measure   Help from another person eating meals?: A Little Help from another person taking care of personal grooming?: A Little Help from another person toileting, which  includes using toliet, bedpan, or urinal?: Total Help from another person bathing (including washing, rinsing, drying)?: A Lot Help from another person to put on and taking off regular upper body clothing?: A Lot Help from another person to put on and taking off regular lower body clothing?: Total 6 Click Score: 12    End of Session Equipment Utilized During Treatment: Other (comment) (stedy)  OT Visit Diagnosis: Unsteadiness on feet (R26.81);Muscle weakness (generalized) (M62.81)   Activity Tolerance Patient tolerated treatment well   Patient Left in chair;with call bell/phone within reach;with chair alarm set   Nurse Communication Need for lift equipment;Mobility status        Time: 1022-1039 OT Time Calculation (min): 17 min  Charges: OT General Charges $OT Visit: 1 Visit OT Treatments $Therapeutic Activity: 8-22 mins  Derl Barrow, OTR/L [place]  Office [phone] Pager: [place] 11/01/2020, 11:47 AM

## 2020-11-01 NOTE — Progress Notes (Signed)
Pt wound dressing is done. Pt tolerated well. Colostomy intact. Will continue monitor the pt.

## 2020-11-01 NOTE — Progress Notes (Signed)
PHYSICAL THERAPY  Pt worked with OT earlier getting OOB to recliner now back to bed and "resting".  Will attempt to see another day as schedule permits.  Rica Koyanagi  PTA Acute  Rehabilitation Services Pager      [phone] Office      [phone]

## 2020-11-02 ENCOUNTER — Inpatient Hospital Stay (HOSPITAL_COMMUNITY): Payer: Medicare HMO

## 2020-11-02 DIAGNOSIS — R198 Other specified symptoms and signs involving the digestive system and abdomen: Secondary | ICD-10-CM | POA: Diagnosis not present

## 2020-11-02 LAB — BASIC METABOLIC PANEL
Anion gap: 5 (ref 5–15)
BUN: 24 mg/dL — ABNORMAL HIGH (ref 8–23)
CO2: 24 mmol/L (ref 22–32)
Calcium: 7.8 mg/dL — ABNORMAL LOW (ref 8.9–10.3)
Chloride: 108 mmol/L (ref 98–111)
Creatinine, Ser: 1.06 mg/dL (ref 0.61–1.24)
GFR, Estimated: >60 mL/min (ref >60)
Glucose, Bld: 127 mg/dL — ABNORMAL HIGH (ref 70–99)
Potassium: 4.4 mmol/L (ref 3.5–5.1)
Sodium: 137 mmol/L (ref 135–145)

## 2020-11-02 LAB — CBC WITH DIFFERENTIAL/PLATELET
Band Neutrophils: 0 %
Basophils Relative: 0 %
Blasts: NONE SEEN %
Eosinophils Relative: 3 %
HCT: 26.3 % — ABNORMAL LOW (ref 39.0–52.0)
Hemoglobin: 7.9 g/dL — ABNORMAL LOW (ref 13.0–17.0)
Lymphocytes Relative: 13 %
MCH: 28 pg (ref 26.0–34.0)
MCHC: 30 g/dL (ref 30.0–36.0)
MCV: 93.3 fL (ref 80.0–100.0)
Metamyelocytes Relative: NONE SEEN %
Monocytes Relative: 4 %
Myelocytes: NONE SEEN %
Neutrophils Relative %: 80 %
Platelets: 446 10*3/uL — ABNORMAL HIGH (ref 150–400)
Promyelocytes Relative: NONE SEEN %
RBC Morphology: NORMAL
RBC: 2.82 MIL/uL — ABNORMAL LOW (ref 4.22–5.81)
RDW: 16.3 % — ABNORMAL HIGH (ref 11.5–15.5)
WBC Morphology: NORMAL
WBC: 13.1 10*3/uL — ABNORMAL HIGH (ref 4.0–10.5)
nRBC: 0.2 % (ref 0.0–0.2)
nRBC: NONE SEEN /100 WBC

## 2020-11-02 LAB — GLUCOSE, CAPILLARY
Glucose-Capillary: 133 mg/dL — ABNORMAL HIGH (ref 70–99)
Glucose-Capillary: 205 mg/dL — ABNORMAL HIGH (ref 70–99)
Glucose-Capillary: 192 mg/dL — ABNORMAL HIGH (ref 70–99)
Glucose-Capillary: 183 mg/dL — ABNORMAL HIGH (ref 70–99)

## 2020-11-02 LAB — MAGNESIUM: Magnesium: 1.8 mg/dL (ref 1.7–2.4)

## 2020-11-02 NOTE — Progress Notes (Signed)
9 Days Post-Op   Subjective/Chief Complaint: No complaints    Objective: Vital signs in last 24 hours: Temp:  [97.5 F (36.4 C)-98.6 F (37 C)] 98.2 F (36.8 C) (08/06 0748) Pulse Rate:  [84-92] 84 (08/06 0748) Resp:  [18-22] 18 (08/06 0748) BP: (111-131)/(49-54) 118/53 (08/06 0748) SpO2:  [98 %-99 %] 98 % (08/06 0748) Last BM Date: 11/02/20  Intake/Output from previous day: 08/05 0701 - 08/06 0700 In: 200 [P.O.:200] Out: 925 [Drains:325; Stool:600] Intake/Output this shift: No intake/output data recorded.  General appearance: alert and cooperative Incision/Wound: Abd: soft, doesn't seem tender, colostomy pouch just emptied but with 1800cc yesterday. stoma is pink and viable.  Midline incision is c/d/I with staples present as well as penrose.  JP with serous output Lab Results:  Recent Labs    11/01/20 0340 11/02/20 0335  WBC 16.5* 13.1*  HGB 8.0* 7.9*  HCT 26.6* 26.3*  PLT 419* 446*   BMET Recent Labs    11/01/20 0340 11/02/20 0335  NA 141 137  K 4.1 4.4  CL 114* 108  CO2 24 24  GLUCOSE 158* 127*  BUN 21 24*  CREATININE 1.03 1.06  CALCIUM 7.5* 7.8*   PT/INR No results for input(s): LABPROT, INR in the last 72 hours. ABG No results for input(s): PHART, HCO3 in the last 72 hours.  Invalid input(s): PCO2, PO2  Studies/Results: No results found.  Anti-infectives: Anti-infectives (From admission, onward)    Start     Dose/Rate Route Frequency Ordered Stop   10/27/20 1400  ceFEPIme (MAXIPIME) 2 g in sodium chloride 0.9 % 100 mL IVPB        2 g 200 mL/hr over 30 Minutes Intravenous Every 8 hours 10/27/20 0927 10/27/20 2250   10/25/20 1400  ceFEPIme (MAXIPIME) 2 g in sodium chloride 0.9 % 100 mL IVPB  Status:  Discontinued        2 g 200 mL/hr over 30 Minutes Intravenous Every 12 hours 10/25/20 0729 10/27/20 0927   10/23/20 1000  ceFEPIme (MAXIPIME) 2 g in sodium chloride 0.9 % 100 mL IVPB  Status:  Discontinued        2 g 200 mL/hr over 30 Minutes  Intravenous Every 8 hours 10/23/20 0833 10/25/20 0729   10/21/20 1000  ceFEPIme (MAXIPIME) 2 g in sodium chloride 0.9 % 100 mL IVPB  Status:  Discontinued        2 g 200 mL/hr over 30 Minutes Intravenous Every 12 hours 10/21/20 0742 10/23/20 0833   10/21/20 0600  metroNIDAZOLE (FLAGYL) IVPB 500 mg        500 mg 100 mL/hr over 60 Minutes Intravenous Every 8 hours 10/20/20 0028 10/28/20 0759   10/20/20 2200  ceFEPIme (MAXIPIME) 2 g in sodium chloride 0.9 % 100 mL IVPB  Status:  Discontinued        2 g 200 mL/hr over 30 Minutes Intravenous Every 24 hours 10/20/20 0048 10/21/20 0742   10/19/20 2030  ceFEPIme (MAXIPIME) 2 g in sodium chloride 0.9 % 100 mL IVPB        2 g 200 mL/hr over 30 Minutes Intravenous  Once 10/19/20 2029 10/20/20 0033   10/19/20 2030  metroNIDAZOLE (FLAGYL) IVPB 500 mg        500 mg 100 mL/hr over 60 Minutes Intravenous  Once 10/19/20 2029 10/19/20 2210       Assessment/Plan: s/p Procedure(s): EXPLORATORY LAPAROTOMY, RIGHT COLECTOMY WITH ANASTOMOSIS. LEFT COLECTOMY WITH COLOSTOMY, MOBILIZATION OF SPLENIC FLEXURE (N/A) POD 9, S/P right colectomy  with anastomosis, left colectomy with end colostomy, mobilization of splenic flexure 10/24/20 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  Fiber BID. Imodium '2mg'$  BID,  -CT with no infectious findings necessarily to explain WBC, which is trending down on it's own with no abx - pelvic drain is SS, can likely DC soon. - surgical path shows 2 cancers.  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  Left colon is invasive moderately differentiated adenocarcinoma with involvement in surrounding soft tissue and 3/12 LN + -onc to arrange outpatient follow up - ontinue nutritional supplements -  mobilize - WOC following Severe protein calorie malnutrition - prealbumin 7.6, PO intake improving   FEN: SOFT diet, ensure, imodium TID, fiber BID VTE: LMWH ID: [email or web address] 7/23>8/1; no need for more abx at this time. Monitor WBC   T2DM HTN CKD stage III Intellectual disability residing at [facility]- place next week   LOS: 13 days    Mark Daniels MD  11/02/2020

## 2020-11-02 NOTE — Progress Notes (Signed)
PT Cancellation Note  Patient Details Name: Mark Chang MRN: [id number] DOB: December 02, 1945   Cancelled Treatment:    Reason Eval/Treat Not Completed: Patient declined, no reason specified. Pt states he is too tired, did not sleep well. RN reports +3 to roll pt today   [facility] 11/02/2020, 12:17 PM

## 2020-11-02 NOTE — Progress Notes (Signed)
Progress Note    Mark Chang   [id number]  DOB: 24-Jun-1945  DOA: 10/19/2020     13  PCP: Terrill Mohr, NP  CC: abdominal pain  Hospital Course: [name] is a 75 yo male with PMH type 2 diabetes mellitus, essential hypertension, CKD stage IIIa, intellectual disability who resides at SNF who initially presented to [facility] ED with complaint of abdominal pain, diarrhea and was found to be hypotensive.  Per SNF staff, he reported abdominal pain for the last few weeks.     In the ED, he was found to have a blood pressure of 68/40, temperature 100.5 F, HR 105, lactic acid 2.2, WBC count 22.7.   CT abdomen/pelvis showed minimal amount of free air, cecal mass probably sigmoid colon with concern for perforated viscus.  GI and general surgery were consulted.  Patient admitted for management of abdominal pain secondary to cecal mass with likely perforated viscus.  Patient was initially monitored with conservative management, could not get a hold of family or legal guardian.  However, patient continued to clinically decline and patient ultimately underwent right colectomy with anastomosis, left colectomy with end colostomy by [name] on 7/28.  Postoperatively, patient remained in respiratory acidosis and was left on mechanical ventilation, required pressors.  He was transferred to ICU.  He was extubated 7/29 and transferred back to [facility] service on 7/30.  TPN was started 7/31.  Pathology from surgery returned.  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  Interval History:  No events overnight.  Resting in bed comfortably this morning.  Output in ostomy bag continuing to improve.  Still denying any pain.  ROS: Constitutional: negative for chills and  fevers, Respiratory: negative for cough and sputum, Cardiovascular: negative for chest pain, and Gastrointestinal: negative for abdominal pain  Assessment & Plan: * Perforated abdominal viscus -Status post right colectomy with anastomosis, left colectomy with end colostomy by [name] 7/28 -s/p TPN; weaned off on 10/29/20 - tolerating soft diet -General surgery following - leukocytosis still present on 8/3; CT A/P ordered per surgery and negative for abscess; has been slowly downtrending off abx - per surgery, once stool output <1000 cc; as of 8/6 output starting to decrease, will need to trend to ensure stable   Septic shock (HCC)-resolved as of 10/30/2020 - s/p ICU with pressors from hypovolemic shock - now improved and resovled  Diarrhea - stool output very liquidy on 8/4; cdiff checked and negative on 8/4 - continue imodium and fibercon - see perforated viscous   Colon obstruction ([place]) - per surgical pathology: Left colon resection revealed invasive moderately differentiated adenocarcinoma involving sigmoid colon.  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 - discussed with oncology; he can have outpatient followup once healed from surgery; CT chest ordered 8/6 to complete staging  - patient has IDD and unable to contact brother or POA; SW/CM assisting with learning who can help patient make medical decisions on next steps too  Intellectual disability - was at [employer]; tentative plan is for [facility] [place] - pending POA help for MDM  DM (diabetes mellitus), type 2 with renal complications ([place]) - continue SSI and CBG monitoring   Acute renal failure superimposed on stage 3a chronic kidney disease (HCC)-resolved as of 10/30/2020 -  patient has history of CKD3a. Baseline creat ~ 1.3, eGFR 48 - patient presents with increase in creat >0.3 mg/dL above baseline, creat increase >1.5x baseline presumed to have occurred within past 7 days PTA -  peaked at 3.92 - resolved with IVF and resuscitation   Acute respiratory failure with hypoxia (HCC)-resolved as of 10/30/2020 - s/p intubation - now on RA   Old records reviewed in assessment of this patient  Antimicrobials:   DVT prophylaxis: enoxaparin (LOVENOX) injection 40 mg Start: 10/25/20 1400 Place and maintain sequential compression device Start: 10/22/20 0852 SCDs Start: 10/20/20 0026   Code Status:   Code Status: Full Code Family Communication:   Disposition Plan: Status is: Inpatient  Remains inpatient appropriate because:IV treatments appropriate due to intensity of illness or inability to take PO and Inpatient level of care appropriate due to severity of illness  Dispo: The patient is from: SNF              Anticipated d/c is to: SNF              Patient currently is not medically stable to d/c.   Difficult to place patient No Risk of unplanned readmission score: Unplanned Admission- Pilot do not use: 21.64   Objective: Blood pressure (!) 118/53, pulse 84, temperature 98.2 F (36.8 C), resp. rate 18, height 5' 7.99" (1.727 m), weight 109.3 kg, SpO2 98 %.  Examination: General appearance: alert, cooperative, no distress, and slowed mentation Head: Normocephalic, without obvious abnormality, atraumatic Eyes:  EOMI Lungs: clear to auscultation bilaterally Heart: regular rate and rhythm and S1, S2 normal Abdomen:  ostomy bag in place with increased amount of liquid brown stool. Penrose drain noted in lower abd incision; 25 staples in place; pelvic drain in place as well Extremities:  no edema Skin: mobility and turgor normal Neurologic: IDD noted; no other focal deficits  Consultants:  Surgery  Procedures:    Data Reviewed: I have personally reviewed following labs and imaging studies Results for orders placed or performed during the hospital encounter of 10/19/20 (from the past 24 hour(s))  Glucose, capillary     Status: Abnormal   Collection Time:  11/01/20  4:55 PM  Result Value Ref Range   Glucose-Capillary 150 (H) 70 - 99 mg/dL  Glucose, capillary     Status: Abnormal   Collection Time: 11/01/20  9:30 PM  Result Value Ref Range   Glucose-Capillary 175 (H) 70 - 99 mg/dL  Basic metabolic panel     Status: Abnormal   Collection Time: 11/02/20  3:35 AM  Result Value Ref Range   Sodium 137 135 - 145 mmol/L   Potassium 4.4 3.5 - 5.1 mmol/L   Chloride 108 98 - 111 mmol/L   CO2 24 22 - 32 mmol/L   Glucose, Bld 127 (H) 70 - 99 mg/dL   BUN 24 (H) 8 - 23 mg/dL   Creatinine, Ser 1.06 0.61 - 1.24 mg/dL   Calcium 7.8 (L) 8.9 - 10.3 mg/dL   GFR, Estimated >60 >60 mL/min   Anion gap 5 5 - 15  CBC with Differential/Platelet     Status: Abnormal   Collection Time: 11/02/20  3:35 AM  Result Value Ref Range   WBC 13.1 (H) 4.0 - 10.5 K/uL   RBC 2.82 (L) 4.22 - 5.81 MIL/uL   Hemoglobin 7.9 (L) 13.0 - 17.0 g/dL   HCT 26.3 (L) 39.0 - 52.0 %   MCV 93.3 80.0 - 100.0 fL   MCH 28.0 26.0 -  34.0 pg   MCHC 30.0 30.0 - 36.0 g/dL   RDW 16.3 (H) 11.5 - 15.5 %   Platelets 446 (H) 150 - 400 K/uL   nRBC 0.2 0.0 - 0.2 %   Neutrophils Relative % 80 %   Band Neutrophils 0 %   Lymphocytes Relative 13 %   Monocytes Relative 4 %   Eosinophils Relative 3 %   Basophils Relative 0 %   WBC Morphology NORMAL    RBC Morphology NORMAL    Smear Review DONE    nRBC NONE SEEN 0 /100 WBC   Metamyelocytes Relative NONE SEEN %   Myelocytes NONE SEEN %   Promyelocytes Relative NONE SEEN %   Blasts NONE SEEN %  Magnesium     Status: None   Collection Time: 11/02/20  3:35 AM  Result Value Ref Range   Magnesium 1.8 1.7 - 2.4 mg/dL  Glucose, capillary     Status: Abnormal   Collection Time: 11/02/20  7:34 AM  Result Value Ref Range   Glucose-Capillary 133 (H) 70 - 99 mg/dL  Glucose, capillary     Status: Abnormal   Collection Time: 11/02/20 12:10 PM  Result Value Ref Range   Glucose-Capillary 205 (H) 70 - 99 mg/dL    Recent Results (from the past 240  hour(s))  Culture, blood (routine x 2)     Status: None   Collection Time: 10/24/20  7:08 PM   Specimen: BLOOD  Result Value Ref Range Status   Specimen Description   Final    BLOOD RIGHT ANTECUBITAL Performed at [facility], [place] [address], [place], [place] [address]    Special Requests   Final    BOTTLES DRAWN AEROBIC AND ANAEROBIC Blood Culture adequate volume Performed at [place] [address] [place], [place] [address]    Culture   Final    NO GROWTH 5 DAYS Performed at [facility], [place] [address], [place], [place] [address]    Report Status 10/30/2020 FINAL  Final  Culture, blood (routine x 2)     Status: None   Collection Time: 10/24/20  7:08 PM   Specimen: BLOOD  Result Value Ref Range Status   Specimen Description   Final    BLOOD LEFT ANTECUBITAL Performed at [place] [address], [place], [place] [address]    Special Requests   Final    BOTTLES DRAWN AEROBIC AND ANAEROBIC Blood Culture results may not be optimal due to an inadequate volume of blood received in culture bottles Performed at [place] [address] [place], [place] [address]    Culture   Final    NO GROWTH 5 DAYS Performed at [facility], [place] [address], [place],  [address]    Report Status 10/30/2020 FINAL  Final  C Difficile Quick Screen (NO PCR Reflex)     Status: None   Collection Time: 10/31/20  8:00 AM   Specimen: STOOL  Result Value Ref Range Status   C Diff antigen NEGATIVE NEGATIVE Final   C Diff toxin NEGATIVE NEGATIVE Final   C Diff interpretation No C. difficile detected.  Final    Comment: Performed at [facility], [place] [address] [place],  [address]     Radiology Studies: No results found. CT ABDOMEN PELVIS W CONTRAST  Final Result    DG CHEST PORT 1 VIEW  Final Result    [place] EKG SITE  RITE  Final Result    CT HEAD WO CONTRAST   Final Result    DG Abd 1 View  Final Result    DG CHEST PORT 1 VIEW  Final Result    DG Abd Portable 1V  Final Result    DG Chest Port 1 View  Final Result  Addendum (preliminary) 1 of 1  ADDENDUM REPORT: 10/24/2020 10:05    ADDENDUM:  Free air discussed by telephone with Dr. Eddie Dibbles STECHSCHULTE on  10/24/2020 at 1000 hours.      Electronically Signed    By: Genevie Ann M.D.    On: 10/24/2020 10:05      Final    DG Abd Portable 1V  Final Result    CT ABDOMEN PELVIS WO CONTRAST  Final Result    DG Chest Port 1 View  Final Result    CT CHEST WO CONTRAST    (Results Pending)    Scheduled Meds:  (feeding supplement) PROSource Plus  30 mL Oral BID BM   amiodarone  200 mg Oral BID   amLODipine  5 mg Oral Daily   carvedilol  6.25 mg Oral BID WC   Chlorhexidine Gluconate Cloth  6 each Topical Daily   docusate sodium  100 mg Oral BID   enoxaparin (LOVENOX) injection  40 mg Subcutaneous Q24H   feeding supplement  237 mL Oral TID BM   insulin aspart  0-20 Units Subcutaneous TID WC   insulin aspart  0-5 Units Subcutaneous QHS   insulin aspart  5 Units Subcutaneous TID WC   insulin glargine-yfgn  40 Units Subcutaneous QHS   lisinopril  40 mg Oral Daily   loperamide  2 mg Oral TID   mouth rinse  15 mL Mouth Rinse BID   multivitamin with minerals  1 tablet Oral Daily   pneumococcal 23 valent vaccine  0.5 mL Intramuscular Tomorrow-1000   polycarbophil  625 mg Oral BID   sodium chloride flush  10-40 mL Intracatheter Q12H   sodium chloride flush  10-40 mL Intracatheter Q12H   PRN Meds: doxylamine (Sleep), fentaNYL (SUBLIMAZE) injection, hydrALAZINE, metoprolol tartrate, [DISCONTINUED] ondansetron **OR** ondansetron (ZOFRAN) IV, oxyCODONE, polyvinyl alcohol, sodium chloride flush, sodium chloride flush Continuous Infusions:  sodium chloride Stopped (10/31/20 1031)     LOS: 13 days  Time spent: Greater than 50% of the 35 minute visit was spent in counseling/coordination of  care for the patient as laid out in the A&P.   Dwyane Dee, MD [facility] 11/02/2020, 2:10 PM

## 2020-11-03 DIAGNOSIS — R197 Diarrhea, unspecified: Secondary | ICD-10-CM

## 2020-11-03 DIAGNOSIS — R198 Other specified symptoms and signs involving the digestive system and abdomen: Secondary | ICD-10-CM | POA: Diagnosis not present

## 2020-11-03 LAB — BASIC METABOLIC PANEL
Anion gap: 6 (ref 5–15)
BUN: 22 mg/dL (ref 8–23)
CO2: 25 mmol/L (ref 22–32)
Calcium: 8 mg/dL — ABNORMAL LOW (ref 8.9–10.3)
Chloride: 104 mmol/L (ref 98–111)
Creatinine, Ser: 0.94 mg/dL (ref 0.61–1.24)
GFR, Estimated: >60 mL/min (ref >60)
Glucose, Bld: 167 mg/dL — ABNORMAL HIGH (ref 70–99)
Potassium: 4.6 mmol/L (ref 3.5–5.1)
Sodium: 135 mmol/L (ref 135–145)

## 2020-11-03 LAB — CBC WITH DIFFERENTIAL/PLATELET
Abs Immature Granulocytes: 0.49 10*3/uL — ABNORMAL HIGH (ref 0.00–0.07)
Basophils Absolute: 0.1 10*3/uL (ref 0.0–0.1)
Basophils Relative: 1 %
Eosinophils Absolute: 0.2 10*3/uL (ref 0.0–0.5)
Eosinophils Relative: 2 %
HCT: 25.9 % — ABNORMAL LOW (ref 39.0–52.0)
Hemoglobin: 7.9 g/dL — ABNORMAL LOW (ref 13.0–17.0)
Immature Granulocytes: 5 %
Lymphocytes Relative: 13 %
Lymphs Abs: 1.5 10*3/uL (ref 0.7–4.0)
MCH: 28.3 pg (ref 26.0–34.0)
MCHC: 30.5 g/dL (ref 30.0–36.0)
MCV: 92.8 fL (ref 80.0–100.0)
Monocytes Absolute: 0.7 10*3/uL (ref 0.1–1.0)
Monocytes Relative: 7 %
Neutro Abs: 7.9 10*3/uL — ABNORMAL HIGH (ref 1.7–7.7)
Neutrophils Relative %: 72 %
Platelets: 485 10*3/uL — ABNORMAL HIGH (ref 150–400)
RBC: 2.79 MIL/uL — ABNORMAL LOW (ref 4.22–5.81)
RDW: 15.9 % — ABNORMAL HIGH (ref 11.5–15.5)
WBC: 10.9 10*3/uL — ABNORMAL HIGH (ref 4.0–10.5)
nRBC: 0 % (ref 0.0–0.2)

## 2020-11-03 LAB — GLUCOSE, CAPILLARY
Glucose-Capillary: 146 mg/dL — ABNORMAL HIGH (ref 70–99)
Glucose-Capillary: 225 mg/dL — ABNORMAL HIGH (ref 70–99)
Glucose-Capillary: 177 mg/dL — ABNORMAL HIGH (ref 70–99)
Glucose-Capillary: 137 mg/dL — ABNORMAL HIGH (ref 70–99)

## 2020-11-03 LAB — MAGNESIUM: Magnesium: 1.8 mg/dL (ref 1.7–2.4)

## 2020-11-03 MED ORDER — ALTEPLASE 2 MG IJ SOLR
2.0000 mg | Freq: Once | INTRAMUSCULAR | Status: AC
  Administered 2020-11-03: 2 mg
  Filled 2020-11-03: qty 2

## 2020-11-03 NOTE — Progress Notes (Signed)
10 Days Post-Op   Subjective/Chief Complaint: none  Pt without complaint    Objective: Vital signs in last 24 hours: Temp:  [97.9 F (36.6 C)-99 F (37.2 C)] 97.9 F (36.6 C) (08/07 0429) Pulse Rate:  [91-96] 96 (08/07 0429) Resp:  [20-22] 20 (08/06 2025) BP: (115-131)/(50-54) 115/50 (08/07 0429) SpO2:  [96 %-100 %] 96 % (08/07 0429) Weight:  [109.3 kg] 109.3 kg (08/06 0944) Last BM Date: 11/02/20  Intake/Output from previous day: 08/06 0701 - 08/07 0700 In: [id number] [P.O.:597] Out: [id number] [Urine:3000; Drains:280; Stool:475] Intake/Output this shift: No intake/output data recorded.   General appearance: alert and cooperative Incision/Wound: Abd: soft, doesn't seem tender, colostomy pouch AND . stoma is pink and viable.  Midline incision is c/d/I with staples present as well as penrose.  JP with serous output Lab Results:  Recent Labs    11/02/20 0335 11/03/20 0505  WBC 13.1* 10.9*  HGB 7.9* 7.9*  HCT 26.3* 25.9*  PLT 446* 485*   BMET Recent Labs    11/02/20 0335 11/03/20 0505  NA 137 135  K 4.4 4.6  CL 108 104  CO2 24 25  GLUCOSE 127* 167*  BUN 24* 22  CREATININE 1.06 0.94  CALCIUM 7.8* 8.0*   PT/INR No results for input(s): LABPROT, INR in the last 72 hours. ABG No results for input(s): PHART, HCO3 in the last 72 hours.  Invalid input(s): PCO2, PO2  Studies/Results: CT CHEST WO CONTRAST  Result Date: 11/02/2020 CLINICAL DATA:  Sigmoid colon cancer, status post resection, chest staging EXAM: CT CHEST WITHOUT CONTRAST TECHNIQUE: Multidetector CT imaging of the chest was performed following the standard protocol without IV contrast. COMPARISON:  None. FINDINGS: Cardiovascular: Aortic atherosclerosis. Right upper extremity PICC, with tip position near the superior cavoatrial junction. Cardiomegaly. Three-vessel coronary artery calcifications and/or stents. No pericardial effusion. Mediastinum/Nodes: No enlarged mediastinal, hilar, or axillary lymph nodes. Thyroid  gland, trachea, and esophagus demonstrate no significant findings. Lungs/Pleura: Small right pleural effusion with associated atelectasis or consolidation. There is a 7 mm pulmonary nodule of the right upper lobe (series 5, image 49). Upper Abdomen: No acute abnormality.  Gallstones in the gallbladder. Musculoskeletal: No chest wall mass or suspicious bone lesions identified. IMPRESSION: 1. Small right pleural effusion with associated atelectasis or consolidation. 2. There is a 7 mm pulmonary nodule of the right upper lobe, nonspecific although moderately suspicious for solitary pulmonary metastasis. Attention on follow-up. 3. Coronary artery disease. 4. Cholelithiasis. Aortic Atherosclerosis (ICD10-I70.0). Electronically Signed   By: Eddie Candle M.D.   On: 11/02/2020 19:56    Anti-infectives: Anti-infectives (From admission, onward)    Start     Dose/Rate Route Frequency Ordered Stop   10/27/20 1400  ceFEPIme (MAXIPIME) 2 g in sodium chloride 0.9 % 100 mL IVPB        2 g 200 mL/hr over 30 Minutes Intravenous Every 8 hours 10/27/20 0927 10/27/20 2250   10/25/20 1400  ceFEPIme (MAXIPIME) 2 g in sodium chloride 0.9 % 100 mL IVPB  Status:  Discontinued        2 g 200 mL/hr over 30 Minutes Intravenous Every 12 hours 10/25/20 0729 10/27/20 0927   10/23/20 1000  ceFEPIme (MAXIPIME) 2 g in sodium chloride 0.9 % 100 mL IVPB  Status:  Discontinued        2 g 200 mL/hr over 30 Minutes Intravenous Every 8 hours 10/23/20 0833 10/25/20 0729   10/21/20 1000  ceFEPIme (MAXIPIME) 2 g in sodium chloride 0.9 % 100 mL IVPB  Status:  Discontinued        2 g 200 mL/hr over 30 Minutes Intravenous Every 12 hours 10/21/20 0742 10/23/20 0833   10/21/20 0600  metroNIDAZOLE (FLAGYL) IVPB 500 mg        500 mg 100 mL/hr over 60 Minutes Intravenous Every 8 hours 10/20/20 0028 10/28/20 0759   10/20/20 2200  ceFEPIme (MAXIPIME) 2 g in sodium chloride 0.9 % 100 mL IVPB  Status:  Discontinued        2 g 200 mL/hr over 30  Minutes Intravenous Every 24 hours 10/20/20 0048 10/21/20 0742   10/19/20 2030  ceFEPIme (MAXIPIME) 2 g in sodium chloride 0.9 % 100 mL IVPB        2 g 200 mL/hr over 30 Minutes Intravenous  Once 10/19/20 2029 10/20/20 0033   10/19/20 2030  metroNIDAZOLE (FLAGYL) IVPB 500 mg        500 mg 100 mL/hr over 60 Minutes Intravenous  Once 10/19/20 2029 10/19/20 2210       Assessment/Plan: s/p Procedure(s): EXPLORATORY LAPAROTOMY, RIGHT COLECTOMY WITH ANASTOMOSIS. LEFT COLECTOMY WITH COLOSTOMY, MOBILIZATION OF SPLENIC FLEXURE (N/A) POD 10  S/P right colectomy with anastomosis, left colectomy with end colostomy, mobilization of splenic flexure 10/24/20 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  Fiber BID. Imodium '2mg'$  BID, -CT with no infectious findings necessarily to explain WBC, which is trending down on it's own with no abx - pelvic drain is SS, can likely DC soon. - surgical path shows 2 cancers.  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  Left colon is invasive moderately differentiated adenocarcinoma with involvement in surrounding soft tissue and 3/12 LN + -onc to arrange outpatient follow up - ontinue nutritional supplements - mobilize - WOC following Severe protein calorie malnutrition - prealbumin 7.6, PO intake improving   FEN: SOFT diet, ensure, imodium TID, fiber BID VTE: LMWH ID: [id number] 7/23>8/1; no need for more abx at this time. Monitor WBC   T2DM HTN CKD stage III Intellectual disability residing at [facility]- place next week   LOS: 14 days    Mark Daniels MD  11/03/2020

## 2020-11-03 NOTE — Progress Notes (Signed)
Occupational Therapy Treatment Patient Details Name: Mark Chang MRN: [id number] DOB: 02-27-1946 Today's Date: 11/03/2020    History of present illness Mark Chang is a 75 year old male with a cognitive disability who presented with large bowel obstruction due to 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 Patient developed free air and required a right colectomy with anastomosis and left colectomy with end colostomy on 10/24/2020.   OT comments  Treatment focused on functional mobility. Patient needing more assistance today with supine to sit and standing. Appears weaker today. Able to attempt stand from recliner height today but needing max assist. Patient's progress limited by his intellectual disability excepting no internal motivation to mobilize. He does follow all commands with increased time and repetition. Cont POC.    Follow Up Recommendations  SNF    Equipment Recommendations  None recommended by OT    Recommendations for Other Services      Precautions / Restrictions Precautions Precautions: Fall Precaution Comments: abdominal incision, colostomy, JP drain, heavy R lean Restrictions Weight Bearing Restrictions: No       Mobility Bed Mobility Overal bed mobility: Needs Assistance Bed Mobility: Supine to Sit     Supine to sit: Max assist;HOB elevated     General bed mobility comments: Max assist to transfer into sitting today - needing assistance for LEs and trunk negotiation.    Transfers Overall transfer level: Needs assistance   Transfers: Sit to/from Stand;Stand Pivot Transfers Sit to Stand: Mod assist;+2 physical assistance;+2 safety/equipment;From elevated surface Stand pivot transfers: Total assist       General transfer comment: Mod assist x 2 to stand from elevated bed height today. Total assist to transfer with stedy. performed second stand from recliner level - requiring max x 2 and patient unable to maintain standing longer than 5 seconds.     Balance Overall balance assessment: Needs assistance Sitting-balance support: Single extremity supported Sitting balance-Leahy Scale: Poor Sitting balance - Comments: leans to right Postural control: Right lateral lean Standing balance support: During functional activity Standing balance-Leahy Scale: Poor                             ADL either performed or assessed with clinical judgement   ADL                                               Vision Baseline Vision/History: No visual deficits Vision Assessment?: No apparent visual deficits   Perception     Praxis      Cognition Arousal/Alertness: Awake/alert Behavior During Therapy: WFL for tasks assessed/performed Overall Cognitive Status: History of cognitive impairments - at baseline                                 General Comments: AxO x 1.5 flat effect but able to express needs and have some conversation.        Exercises     Shoulder Instructions       General Comments      Pertinent Vitals/ Pain       Pain Assessment: No/denies pain  Home Living  Prior Functioning/Environment              Frequency  Min 2X/week        Progress Toward Goals  OT Goals(current goals can now be found in the care plan section)  Progress towards OT goals: Progressing toward goals  Acute Rehab OT Goals Patient Stated Goal: Get up to chair OT Goal Formulation: With patient Time For Goal Achievement: 11/09/20 Potential to Achieve Goals: [place] Discharge plan remains appropriate    Co-evaluation          OT goals addressed during session:  (functional mobility)      AM-PAC OT "6 Clicks" Daily Activity     Outcome Measure   Help from another person eating meals?: A Little Help from another person taking care of personal grooming?: A Little Help from another person toileting, which includes using  toliet, bedpan, or urinal?: Total Help from another person bathing (including washing, rinsing, drying)?: A Lot Help from another person to put on and taking off regular upper body clothing?: A Lot Help from another person to put on and taking off regular lower body clothing?: Total 6 Click Score: 12    End of Session Equipment Utilized During Treatment: Other (comment) (stedy)  OT Visit Diagnosis: Unsteadiness on feet (R26.81);Muscle weakness (generalized) (M62.81)   Activity Tolerance Patient tolerated treatment well   Patient Left in chair;with call bell/phone within reach;with chair alarm set   Nurse Communication Need for lift equipment;Mobility status        Time: [id number] OT Time Calculation (min): 14 min  Charges: OT General Charges $OT Visit: 1 Visit OT Treatments $Therapeutic Activity: 8-22 mins  Mark Chang, OTR/L [place]  Office [phone] Pager: [place] 11/03/2020, 2:55 PM

## 2020-11-03 NOTE — Progress Notes (Signed)
Progress Note    Mark Chang   [id number]  DOB: Jun 01, 1945  DOA: 10/19/2020     14  PCP: Terrill Mohr, NP  CC: abdominal pain  Hospital Course: Mark Chang is a 75 yo male with PMH type 2 diabetes mellitus, essential hypertension, CKD stage IIIa, intellectual disability who resides at SNF who initially presented to [facility] ED with complaint of abdominal pain, diarrhea and was found to be hypotensive.  Per SNF staff, he reported abdominal pain for the last few weeks.     In the ED, he was found to have a blood pressure of 68/40, temperature 100.5 F, HR 105, lactic acid 2.2, WBC count 22.7.   CT abdomen/pelvis showed minimal amount of free air, cecal mass probably sigmoid colon with concern for perforated viscus.  GI and general surgery were consulted.  Patient admitted for management of abdominal pain secondary to cecal mass with likely perforated viscus.  Patient was initially monitored with conservative management, could not get a hold of family or legal guardian.  However, patient continued to clinically decline and patient ultimately underwent right colectomy with anastomosis, left colectomy with end colostomy by [name] on 7/28.  Postoperatively, patient remained in respiratory acidosis and was left on mechanical ventilation, required pressors.  He was transferred to ICU.  He was extubated 7/29 and transferred back to [facility] service on 7/30.  TPN was started 7/31.  Pathology from surgery returned.  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  Interval History:  No events; resting comfortably in bed.  Denies pain or N/V.   ROS: Constitutional: negative for chills and fevers, Respiratory: negative for cough and sputum, Cardiovascular: negative  for chest pain, and Gastrointestinal: negative for abdominal pain  Assessment & Plan: * Perforated abdominal viscus-resolved as of 11/03/2020 -Status post right colectomy with anastomosis, left colectomy with end colostomy by [name] 7/28 -s/p TPN; weaned off on 10/29/20 - tolerating soft diet -General surgery following - leukocytosis still present on 8/3; CT A/P ordered per surgery and negative for abscess; leukocytosis has been slowly downtrending off abx - per surgery, once stool output <1000 cc; as of 8/6 output starting to decrease, will need to trend to ensure stable (1835>>600>>475) - pelvic drain and penrose drain per surgery  Septic shock (HCC)-resolved as of 10/30/2020 - s/p ICU with pressors from hypovolemic shock - now improved and resovled  Diarrhea - stool output very liquidy on 8/4; cdiff checked and negative on 8/4 - continue imodium and fibercon - see perforated viscous   Intellectual disability - was at [employer] [facility]; tentative plan is for [facility] in [place] - pending POA help for MDM  Colon obstruction (HCC)-resolved as of 11/03/2020 - per surgical pathology: Left colon resection revealed invasive moderately differentiated adenocarcinoma involving sigmoid colon.  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 - discussed with oncology; he can have outpatient followup once healed from surgery; CT chest ordered 8/6 to complete staging  - patient has IDD and unable to contact brother or POA; SW/CM assisting with learning who can help patient make medical decisions on next steps too  DM (diabetes mellitus), type 2 with renal complications ([place]) - continue SSI and CBG monitoring   Acute renal failure superimposed on stage 3a chronic kidney disease (HCC)-resolved  as of 10/30/2020 - patient has history of CKD3a. Baseline creat ~ 1.3, eGFR 48 - patient presents with increase in creat >0.3 mg/dL above baseline, creat increase >1.5x baseline presumed  to have occurred within past 7 days PTA - peaked at 3.92 - resolved with IVF and resuscitation   Acute respiratory failure with hypoxia (HCC)-resolved as of 10/30/2020 - s/p intubation - now on RA   Old records reviewed in assessment of this patient  Antimicrobials:   DVT prophylaxis: enoxaparin (LOVENOX) injection 40 mg Start: 10/25/20 1400 Place and maintain sequential compression device Start: 10/22/20 0852 SCDs Start: 10/20/20 0026   Code Status:   Code Status: Full Code Family Communication:   Disposition Plan: Status is: Inpatient  Remains inpatient appropriate because:IV treatments appropriate due to intensity of illness or inability to take PO and Inpatient level of care appropriate due to severity of illness  Dispo: The patient is from: SNF              Anticipated d/c is to: SNF              Patient currently is not medically stable to d/c.   Difficult to place patient Yes Risk of unplanned readmission score: Unplanned Admission- Pilot do not use: 19.98   Objective: Blood pressure (!) 130/58, pulse (!) 106, temperature (!) 97.1 F (36.2 C), resp. rate 19, height 5' 7.99" (1.727 m), weight 109.3 kg, SpO2 99 %.  Examination: General appearance: alert, cooperative, no distress, and slowed mentation Head: Normocephalic, without obvious abnormality, atraumatic Eyes:  EOMI Lungs: clear to auscultation bilaterally Heart: regular rate and rhythm and S1, S2 normal Abdomen:  ostomy bag in place with increased amount of liquid brown stool. Penrose drain noted in lower abd incision; 25 staples in place; pelvic drain in place as well Extremities:  no edema Skin: mobility and turgor normal Neurologic: IDD noted; no other focal deficits  Consultants:  Surgery  Procedures:    Data Reviewed: I have personally reviewed following labs and imaging studies Results for orders placed or performed during the hospital encounter of 10/19/20 (from the past 24 hour(s))  Glucose,  capillary     Status: Abnormal   Collection Time: 11/02/20  4:38 PM  Result Value Ref Range   Glucose-Capillary 192 (H) 70 - 99 mg/dL  Glucose, capillary     Status: Abnormal   Collection Time: 11/02/20  8:51 PM  Result Value Ref Range   Glucose-Capillary 183 (H) 70 - 99 mg/dL  Basic metabolic panel     Status: Abnormal   Collection Time: 11/03/20  5:05 AM  Result Value Ref Range   Sodium 135 135 - 145 mmol/L   Potassium 4.6 3.5 - 5.1 mmol/L   Chloride 104 98 - 111 mmol/L   CO2 25 22 - 32 mmol/L   Glucose, Bld 167 (H) 70 - 99 mg/dL   BUN 22 8 - 23 mg/dL   Creatinine, Ser 0.94 0.61 - 1.24 mg/dL   Calcium 8.0 (L) 8.9 - 10.3 mg/dL   GFR, Estimated >60 >60 mL/min   Anion gap 6 5 - 15  CBC with Differential/Platelet     Status: Abnormal   Collection Time: 11/03/20  5:05 AM  Result Value Ref Range   WBC 10.9 (H) 4.0 - 10.5 K/uL   RBC 2.79 (L) 4.22 - 5.81 MIL/uL   Hemoglobin 7.9 (L) 13.0 - 17.0 g/dL   HCT 25.9 (L) 39.0 - 52.0 %   MCV 92.8 80.0 - 100.0 fL  MCH 28.3 26.0 - 34.0 pg   MCHC 30.5 30.0 - 36.0 g/dL   RDW 15.9 (H) 11.5 - 15.5 %   Platelets 485 (H) 150 - 400 K/uL   nRBC 0.0 0.0 - 0.2 %   Neutrophils Relative % 72 %   Neutro Abs 7.9 (H) 1.7 - 7.7 K/uL   Lymphocytes Relative 13 %   Lymphs Abs 1.5 0.7 - 4.0 K/uL   Monocytes Relative 7 %   Monocytes Absolute 0.7 0.1 - 1.0 K/uL   Eosinophils Relative 2 %   Eosinophils Absolute 0.2 0.0 - 0.5 K/uL   Basophils Relative 1 %   Basophils Absolute 0.1 0.0 - 0.1 K/uL   Immature Granulocytes 5 %   Abs Immature Granulocytes 0.49 (H) 0.00 - 0.07 K/uL  Magnesium     Status: None   Collection Time: 11/03/20  5:05 AM  Result Value Ref Range   Magnesium 1.8 1.7 - 2.4 mg/dL  Glucose, capillary     Status: Abnormal   Collection Time: 11/03/20  7:38 AM  Result Value Ref Range   Glucose-Capillary 146 (H) 70 - 99 mg/dL  Glucose, capillary     Status: Abnormal   Collection Time: 11/03/20 11:34 AM  Result Value Ref Range    Glucose-Capillary 225 (H) 70 - 99 mg/dL    Recent Results (from the past 240 hour(s))  Culture, blood (routine x 2)     Status: None   Collection Time: 10/24/20  7:08 PM   Specimen: BLOOD  Result Value Ref Range Status   Specimen Description   Final    BLOOD RIGHT ANTECUBITAL Performed at [facility] [place] [address], [place], [place] [address]    Special Requests   Final    BOTTLES DRAWN AEROBIC AND ANAEROBIC Blood Culture adequate volume Performed at [place] [address], [place], [place] [address]    Culture   Final    NO GROWTH 5 DAYS Performed at [facility], [place] [address] [place], [place] [address]    Report Status 10/30/2020 FINAL  Final  Culture, blood (routine x 2)     Status: None   Collection Time: 10/24/20  7:08 PM   Specimen: BLOOD  Result Value Ref Range Status   Specimen Description   Final    BLOOD LEFT ANTECUBITAL Performed at [place] [address], [place], [place] [address]    Special Requests   Final    BOTTLES DRAWN AEROBIC AND ANAEROBIC Blood Culture results may not be optimal due to an inadequate volume of blood received in culture bottles Performed at [place] [address], [place], [place] [address]    Culture   Final    NO GROWTH 5 DAYS Performed at [facility], [place] [address] [place], [place] [address]    Report Status 10/30/2020 FINAL  Final  C Difficile Quick Screen (NO PCR Reflex)     Status: None   Collection Time: 10/31/20  8:00 AM   Specimen: STOOL  Result Value Ref Range Status   C Diff antigen NEGATIVE NEGATIVE Final   C Diff toxin NEGATIVE NEGATIVE Final   C Diff interpretation No C. difficile detected.  Final    Comment: Performed at [facility], [place] [address] [place],  [address]     Radiology Studies: CT CHEST WO CONTRAST  Result Date: 11/02/2020 CLINICAL DATA:  Sigmoid colon cancer,  status post resection, chest  staging EXAM: CT CHEST WITHOUT CONTRAST TECHNIQUE: Multidetector CT imaging of the chest was performed following the standard protocol without IV contrast. COMPARISON:  None. FINDINGS: Cardiovascular: Aortic atherosclerosis. Right upper extremity PICC, with tip position near the superior cavoatrial junction. Cardiomegaly. Three-vessel coronary artery calcifications and/or stents. No pericardial effusion. Mediastinum/Nodes: No enlarged mediastinal, hilar, or axillary lymph nodes. Thyroid gland, trachea, and esophagus demonstrate no significant findings. Lungs/Pleura: Small right pleural effusion with associated atelectasis or consolidation. There is a 7 mm pulmonary nodule of the right upper lobe (series 5, image 49). Upper Abdomen: No acute abnormality.  Gallstones in the gallbladder. Musculoskeletal: No chest wall mass or suspicious bone lesions identified. IMPRESSION: 1. Small right pleural effusion with associated atelectasis or consolidation. 2. There is a 7 mm pulmonary nodule of the right upper lobe, nonspecific although moderately suspicious for solitary pulmonary metastasis. Attention on follow-up. 3. Coronary artery disease. 4. Cholelithiasis. Aortic Atherosclerosis (ICD10-I70.0). Electronically Signed   By: Eddie Candle M.D.   On: 11/02/2020 19:56   CT CHEST WO CONTRAST  Final Result    CT ABDOMEN PELVIS W CONTRAST  Final Result    DG CHEST PORT 1 VIEW  Final Result    [place] EKG SITE RITE  Final Result    CT HEAD WO CONTRAST  Final Result    DG Abd 1 View  Final Result    DG CHEST PORT 1 VIEW  Final Result    DG Abd Portable 1V  Final Result    DG Chest Port 1 View  Final Result  Addendum (preliminary) 1 of 1  ADDENDUM REPORT: 10/24/2020 10:05    ADDENDUM:  Free air discussed by telephone with Dr. Eddie Dibbles STECHSCHULTE on  10/24/2020 at 1000 hours.      Electronically Signed    By: Genevie Ann M.D.    On: 10/24/2020 10:05      Final    DG Abd  Portable 1V  Final Result    CT ABDOMEN PELVIS WO CONTRAST  Final Result    DG Chest Port 1 View  Final Result      Scheduled Meds:  (feeding supplement) PROSource Plus  30 mL Oral BID BM   alteplase  2 mg Intracatheter Once   alteplase  2 mg Intracatheter Once   amiodarone  200 mg Oral BID   amLODipine  5 mg Oral Daily   carvedilol  6.25 mg Oral BID WC   Chlorhexidine Gluconate Cloth  6 each Topical Daily   docusate sodium  100 mg Oral BID   enoxaparin (LOVENOX) injection  40 mg Subcutaneous Q24H   feeding supplement  237 mL Oral TID BM   insulin aspart  0-20 Units Subcutaneous TID WC   insulin aspart  0-5 Units Subcutaneous QHS   insulin aspart  5 Units Subcutaneous TID WC   insulin glargine-yfgn  40 Units Subcutaneous QHS   lisinopril  40 mg Oral Daily   loperamide  2 mg Oral TID   mouth rinse  15 mL Mouth Rinse BID   multivitamin with minerals  1 tablet Oral Daily   pneumococcal 23 valent vaccine  0.5 mL Intramuscular Tomorrow-1000   polycarbophil  625 mg Oral BID   sodium chloride flush  10-40 mL Intracatheter Q12H   sodium chloride flush  10-40 mL Intracatheter Q12H   PRN Meds: doxylamine (Sleep), fentaNYL (SUBLIMAZE) injection, hydrALAZINE, metoprolol tartrate, [DISCONTINUED] ondansetron **OR** ondansetron (ZOFRAN) IV, oxyCODONE, polyvinyl alcohol, sodium chloride flush, sodium chloride flush Continuous Infusions:  sodium chloride  Stopped (10/31/20 1031)     LOS: 14 days  Time spent: Greater than 50% of the 35 minute visit was spent in counseling/coordination of care for the patient as laid out in the A&P.   Dwyane Dee, MD [facility] 11/03/2020, 2:13 PM

## 2020-11-04 DIAGNOSIS — R198 Other specified symptoms and signs involving the digestive system and abdomen: Secondary | ICD-10-CM | POA: Diagnosis not present

## 2020-11-04 LAB — BASIC METABOLIC PANEL
Anion gap: 7 (ref 5–15)
BUN: 22 mg/dL (ref 8–23)
CO2: 27 mmol/L (ref 22–32)
Calcium: 8.2 mg/dL — ABNORMAL LOW (ref 8.9–10.3)
Chloride: 101 mmol/L (ref 98–111)
Creatinine, Ser: 1.19 mg/dL (ref 0.61–1.24)
GFR, Estimated: >60 mL/min (ref >60)
Glucose, Bld: 77 mg/dL (ref 70–99)
Potassium: 4.2 mmol/L (ref 3.5–5.1)
Sodium: 135 mmol/L (ref 135–145)

## 2020-11-04 LAB — GLUCOSE, CAPILLARY
Glucose-Capillary: 70 mg/dL (ref 70–99)
Glucose-Capillary: 203 mg/dL — ABNORMAL HIGH (ref 70–99)
Glucose-Capillary: 172 mg/dL — ABNORMAL HIGH (ref 70–99)
Glucose-Capillary: 149 mg/dL — ABNORMAL HIGH (ref 70–99)

## 2020-11-04 LAB — MAGNESIUM: Magnesium: 1.7 mg/dL (ref 1.7–2.4)

## 2020-11-04 LAB — CBC WITH DIFFERENTIAL/PLATELET
Abs Immature Granulocytes: 0.38 10*3/uL — ABNORMAL HIGH (ref 0.00–0.07)
Basophils Absolute: 0.1 10*3/uL (ref 0.0–0.1)
Basophils Relative: 1 %
Eosinophils Absolute: 0.2 10*3/uL (ref 0.0–0.5)
Eosinophils Relative: 2 %
HCT: 27 % — ABNORMAL LOW (ref 39.0–52.0)
Hemoglobin: 8.2 g/dL — ABNORMAL LOW (ref 13.0–17.0)
Immature Granulocytes: 4 %
Lymphocytes Relative: 12 %
Lymphs Abs: 1.3 10*3/uL (ref 0.7–4.0)
MCH: 27.5 pg (ref 26.0–34.0)
MCHC: 30.4 g/dL (ref 30.0–36.0)
MCV: 90.6 fL (ref 80.0–100.0)
Monocytes Absolute: 0.8 10*3/uL (ref 0.1–1.0)
Monocytes Relative: 7 %
Neutro Abs: 8.1 10*3/uL — ABNORMAL HIGH (ref 1.7–7.7)
Neutrophils Relative %: 74 %
Platelets: 454 10*3/uL — ABNORMAL HIGH (ref 150–400)
RBC: 2.98 MIL/uL — ABNORMAL LOW (ref 4.22–5.81)
RDW: 16.2 % — ABNORMAL HIGH (ref 11.5–15.5)
WBC: 10.9 10*3/uL — ABNORMAL HIGH (ref 4.0–10.5)
nRBC: 0.2 % (ref 0.0–0.2)

## 2020-11-04 NOTE — Progress Notes (Signed)
11 Days Post-Op   Subjective/Chief Complaint: No complaints this morning   Objective: Vital signs in last 24 hours: Temp:  [97.1 F (36.2 C)-98.4 F (36.9 C)] 98.4 F (36.9 C) (08/08 0526) Pulse Rate:  [89-106] 106 (08/08 0526) Resp:  [19-20] 20 (08/08 0526) BP: (125-156)/(55-85) 125/55 (08/08 0526) SpO2:  [91 %-99 %] 93 % (08/08 0526) Last BM Date: 11/03/20  Intake/Output from previous day: 08/07 0701 - 08/08 0700 In: 3417 [P.O.:3417] Out: 5880 [Urine:5450; Drains:130; Stool:300] Intake/Output this shift: No intake/output data recorded.  Exam: Awake and alert Abdomen soft, incision healing well, ostomy productive Drain serous  Lab Results:  Recent Labs    11/03/20 0505 11/04/20 0558  WBC 10.9* 10.9*  HGB 7.9* 8.2*  HCT 25.9* 27.0*  PLT 485* 454*   BMET Recent Labs    11/03/20 0505 11/04/20 0558  NA 135 135  K 4.6 4.2  CL 104 101  CO2 25 27  GLUCOSE 167* 77  BUN 22 22  CREATININE 0.94 1.19  CALCIUM 8.0* 8.2*   PT/INR No results for input(s): LABPROT, INR in the last 72 hours. ABG No results for input(s): PHART, HCO3 in the last 72 hours.  Invalid input(s): PCO2, PO2  Studies/Results: CT CHEST WO CONTRAST  Result Date: 11/02/2020 CLINICAL DATA:  Sigmoid colon cancer, status post resection, chest staging EXAM: CT CHEST WITHOUT CONTRAST TECHNIQUE: Multidetector CT imaging of the chest was performed following the standard protocol without IV contrast. COMPARISON:  None. FINDINGS: Cardiovascular: Aortic atherosclerosis. Right upper extremity PICC, with tip position near the superior cavoatrial junction. Cardiomegaly. Three-vessel coronary artery calcifications and/or stents. No pericardial effusion. Mediastinum/Nodes: No enlarged mediastinal, hilar, or axillary lymph nodes. Thyroid gland, trachea, and esophagus demonstrate no significant findings. Lungs/Pleura: Small right pleural effusion with associated atelectasis or consolidation. There is a 7 mm  pulmonary nodule of the right upper lobe (series 5, image 49). Upper Abdomen: No acute abnormality.  Gallstones in the gallbladder. Musculoskeletal: No chest wall mass or suspicious bone lesions identified. IMPRESSION: 1. Small right pleural effusion with associated atelectasis or consolidation. 2. There is a 7 mm pulmonary nodule of the right upper lobe, nonspecific although moderately suspicious for solitary pulmonary metastasis. Attention on follow-up. 3. Coronary artery disease. 4. Cholelithiasis. Aortic Atherosclerosis (ICD10-I70.0). Electronically Signed   By: Eddie Candle M.D.   On: 11/02/2020 19:56    Anti-infectives: Anti-infectives (From admission, onward)    Start     Dose/Rate Route Frequency Ordered Stop   10/27/20 1400  ceFEPIme (MAXIPIME) 2 g in sodium chloride 0.9 % 100 mL IVPB        2 g 200 mL/hr over 30 Minutes Intravenous Every 8 hours 10/27/20 0927 10/27/20 2250   10/25/20 1400  ceFEPIme (MAXIPIME) 2 g in sodium chloride 0.9 % 100 mL IVPB  Status:  Discontinued        2 g 200 mL/hr over 30 Minutes Intravenous Every 12 hours 10/25/20 0729 10/27/20 0927   10/23/20 1000  ceFEPIme (MAXIPIME) 2 g in sodium chloride 0.9 % 100 mL IVPB  Status:  Discontinued        2 g 200 mL/hr over 30 Minutes Intravenous Every 8 hours 10/23/20 0833 10/25/20 0729   10/21/20 1000  ceFEPIme (MAXIPIME) 2 g in sodium chloride 0.9 % 100 mL IVPB  Status:  Discontinued        2 g 200 mL/hr over 30 Minutes Intravenous Every 12 hours 10/21/20 0742 10/23/20 0833   10/21/20 0600  metroNIDAZOLE (FLAGYL) IVPB  500 mg        500 mg 100 mL/hr over 60 Minutes Intravenous Every 8 hours 10/20/20 0028 10/28/20 0759   10/20/20 2200  ceFEPIme (MAXIPIME) 2 g in sodium chloride 0.9 % 100 mL IVPB  Status:  Discontinued        2 g 200 mL/hr over 30 Minutes Intravenous Every 24 hours 10/20/20 0048 10/21/20 0742   10/19/20 2030  ceFEPIme (MAXIPIME) 2 g in sodium chloride 0.9 % 100 mL IVPB        2 g 200 mL/hr over 30  Minutes Intravenous  Once 10/19/20 2029 10/20/20 0033   10/19/20 2030  metroNIDAZOLE (FLAGYL) IVPB 500 mg        500 mg 100 mL/hr over 60 Minutes Intravenous  Once 10/19/20 2029 10/19/20 2210       Assessment/Plan: s/p Procedure(s): EXPLORATORY LAPAROTOMY, RIGHT COLECTOMY WITH ANASTOMOSIS. LEFT COLECTOMY WITH COLOSTOMY, MOBILIZATION OF SPLENIC FLEXURE (N/A)  S/P right colectomy with anastomosis, left colectomy with end colostomy, mobilization of splenic flexure 10/24/20 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

## 2020-11-04 NOTE — TOC Progression Note (Signed)
Transition of Care [facility]) - Progression Note    Patient Details  Name: VICK BARRIGA MRN: [id number] Date of Birth: 11/17/1945  Transition of Care [facility]) CM/SW [place], [place] Phone Number: 11/04/2020, 1:55 PM  Clinical Narrative:   Still waiting on insurance authorization for [facility]. Patient will likely get staples out tomorrow before d/c. Spoke with Ms Georges Lynch at [facility] to find out if someone there can sign patient in.  She will check with corporate and get back with me. TOC will continue to follow during the course of hospitalization.     Expected Discharge Plan: Skilled Nursing Facility Barriers to Discharge: SNF Pending bed offer  Expected Discharge Plan and Services Expected Discharge Plan: [place]   Discharge Planning Services: CM Consult Post Acute Care Choice: [place] Living arrangements for the past 2 months: Assisted Living Facility                                       Social Determinants of Health (SDOH) Interventions    Readmission Risk Interventions No flowsheet data found.

## 2020-11-04 NOTE — Progress Notes (Signed)
Pt wound are dry and intact. Dresing done. Pt tolerated well. Colostomy intact. Will continue monitor the pt.

## 2020-11-04 NOTE — Progress Notes (Signed)
Physical Therapy Treatment Patient Details Name: Mark Chang MRN: [id number] DOB: 1945/04/08 Today's Date: 11/04/2020    History of Present Illness 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 Patient developed free air and required a right colectomy with anastomosis and left colectomy with end colostomy on 10/24/2020.    PT Comments    Patient making slow progress with mobility overall. Pt follows cues with increased time and balance slightly improved today with no significant Rt lean observed. Patient required Max +2 assist for bed mobility and Mod +2 with use of Stedy to rise from elevated bed. Min Assist for power up from [facility] paddles today with cues for posture; pt fatigued quickly with activity and EOS resting in chair with alarm on and LE's elevated. Acute PT will continue to progress pt as able. Plan to attempt stepping/pre-gait activity next time.    Follow Up Recommendations  SNF     Equipment Recommendations  None recommended by PT    Recommendations for Other Services       Precautions / Restrictions Precautions Precautions: Fall Precaution Comments: abdominal incision, colostomy, JP drain, heavy R lean Restrictions Weight Bearing Restrictions: No    Mobility  Bed Mobility Overal bed mobility: Needs Assistance Bed Mobility: Rolling;Sidelying to Sit Rolling: Max assist Sidelying to sit: Max assist;+2 for physical assistance;+2 for safety/equipment;HOB elevated       General bed mobility comments: Max assist to roll trunk with multimodal cues to rech for bed rail. Pt initiated moving LE's towards EOB but required Max assist to bring them fully off and 2+ assist to raise trunk. Bed pad used for anterior scoot to EOB and pt able to maintain balcen with Bil UE support.    Transfers Overall transfer level: Needs assistance   Transfers: Sit to/from  Stand;Stand Pivot Transfers Sit to Stand: Mod assist;+2 physical assistance;+2 safety/equipment;From elevated surface Stand pivot transfers: Total assist       General transfer comment: Mod +2 to rise from EOB with Stedy. Min Assist to rise from Stedy paddles for repeat sit to stands. Pt with flexed trunk requiring cues for upright posture. Pt HR max of 115 bpm with activity and c/o faitgue.Total assist to move to recliner with Steady for pivot.  Ambulation/Gait                 Stairs             Wheelchair Mobility    Modified Rankin (Stroke Patients Only)       Balance Overall balance assessment: Needs assistance Sitting-balance support: Bilateral upper extremity supported;Feet supported Sitting balance-Leahy Scale: Poor Sitting balance - Comments: reliant no external support with bil UE's to maintain seated balance. min gaurd for safety.   Standing balance support: Bilateral upper extremity supported Standing balance-Leahy Scale: Poor Standing balance comment: Use of Stedy, reliant on external support                            Cognition Arousal/Alertness: Awake/alert Behavior During Therapy: WFL for tasks assessed/performed Overall Cognitive Status: History of cognitive impairments - at baseline                                 General Comments: orietned to self, flat affect, anwers questions appropriately with increased time  for processing. able to express needs.      Exercises Other Exercises Other Exercises: 5x Sit<>Stand from [place].    General Comments        Pertinent Vitals/Pain Pain Assessment: Faces Faces Pain Scale: Hurts a little bit Pain Location: LE's and abdomen with activity Pain Descriptors / Indicators: Discomfort;Grimacing;Tender Pain Intervention(s): Limited activity within patient's tolerance;Monitored during session;Repositioned    Home Living                      Prior Function             PT Goals (current goals can now be found in the care plan section) Acute Rehab PT Goals Patient Stated Goal: Get up to chair PT Goal Formulation: With patient Time For Goal Achievement: 11/08/20 Potential to Achieve Goals: Fair Progress towards PT goals: Progressing toward goals    Frequency    Min 2X/week      PT Plan Current plan remains appropriate    Co-evaluation              AM-PAC PT "6 Clicks" Mobility   Outcome Measure  Help needed turning from your back to your side while in a flat bed without using bedrails?: A Lot Help needed moving from lying on your back to sitting on the side of a flat bed without using bedrails?: A Lot Help needed moving to and from a bed to a chair (including a wheelchair)?: Total Help needed standing up from a chair using your arms (e.g., wheelchair or bedside chair)?: Total Help needed to walk in hospital room?: Total Help needed climbing 3-5 steps with a railing? : Total 6 Click Score: 8    End of Session Equipment Utilized During Treatment: Gait belt Activity Tolerance: Patient limited by fatigue Patient left: in chair;with call bell/phone within reach;with chair alarm set Nurse Communication: Mobility status;Need for lift equipment PT Visit Diagnosis: Unsteadiness on feet (R26.81);Muscle weakness (generalized) (M62.81);Difficulty in walking, not elsewhere classified (R26.2)     Time: [phone] (RN giving meds at start of session, ~4 mins)-1022 PT Time Calculation (min) (ACUTE ONLY): 28 min  Charges:  $Therapeutic Activity: 23-37 mins                     Verner Mould, DPT Acute Rehabilitation Services Office [phone] Pager [phone]    Jacques Navy 11/04/2020, 11:32 AM

## 2020-11-04 NOTE — Progress Notes (Signed)
Progress Note    Mark Chang   [id number]  DOB: 1945-09-18  DOA: 10/19/2020     15  PCP: Terrill Mohr, NP  CC: abdominal pain  Hospital Course: Mark Chang is a 75 yo male with PMH type 2 diabetes mellitus, essential hypertension, CKD stage IIIa, intellectual disability who resides at SNF who initially presented to [facility] ED with complaint of abdominal pain, diarrhea and was found to be hypotensive.  Per SNF staff, he reported abdominal pain for the last few weeks.     In the ED, he was found to have a blood pressure of 68/40, temperature 100.5 F, HR 105, lactic acid 2.2, WBC count 22.7.   CT abdomen/pelvis showed minimal amount of free air, cecal mass probably sigmoid colon with concern for perforated viscus.  GI and general surgery were consulted.  Patient admitted for management of abdominal pain secondary to cecal mass with likely perforated viscus.  Patient was initially monitored with conservative management, could not get a hold of family or legal guardian.  However, patient continued to clinically decline and patient ultimately underwent right colectomy with anastomosis, left colectomy with end colostomy by [name] on 7/28.  Postoperatively, patient remained in respiratory acidosis and was left on mechanical ventilation, required pressors.  He was transferred to ICU.  He was extubated 7/29 and transferred back to [name] service on 7/30.  TPN was started 7/31.  Pathology from surgery returned.  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  Interval History:  No events overnight; very pleasant as usual and denies any pain or discomfort. Has been eating well and his only question is always about when does he get to leave the hospital.    ROS: Constitutional: negative for chills and fevers, Respiratory: negative for cough and sputum, Cardiovascular: negative for chest pain, and Gastrointestinal: negative for abdominal pain  Assessment & Plan: * Perforated abdominal viscus-resolved as of 11/03/2020 -Status post right colectomy with anastomosis, left colectomy with end colostomy by [name] 7/28 -s/p TPN; weaned off on 10/29/20 - tolerating soft diet -General surgery following - leukocytosis still present on 8/3; CT A/P ordered per surgery and negative for abscess; leukocytosis has been slowly downtrending off abx - per surgery, once stool output <1000 cc; as of 8/6 output starting to decrease, will need to trend to ensure stable (1835>>600>>475>>300) - pelvic drain and penrose drain per surgery; possibly coming out on 8/9  Septic shock (HCC)-resolved as of 10/30/2020 - s/p ICU with pressors from hypovolemic shock - now improved and resovled  Diarrhea - stool output very liquidy on 8/4; cdiff checked and negative on 8/4 - continue imodium and fibercon - see perforated viscous   Intellectual disability - was at [employer]; tentative plan is for [facility] in [place] inquiring if SNF can sign patient in; tentative plan would be d/c on 8/9 if able  - pending POA help for MDM  Colon obstruction (HCC)-resolved as of 11/03/2020 - per surgical pathology: Left colon resection revealed invasive moderately differentiated adenocarcinoma involving sigmoid colon.  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 - discussed with oncology; he can have outpatient followup once healed from surgery; CT chest ordered 8/6 to complete staging  - patient has IDD and unable to contact brother or  POA; SW/CM assisting with learning who can help patient make medical decisions on next steps too  DM (diabetes mellitus), type 2 with renal complications ([place]) - continue SSI and CBG monitoring   Acute renal failure  superimposed on stage 3a chronic kidney disease (HCC)-resolved as of 10/30/2020 - patient has history of CKD3a. Baseline creat ~ 1.3, eGFR 48 - patient presents with increase in creat >0.3 mg/dL above baseline, creat increase >1.5x baseline presumed to have occurred within past 7 days PTA - peaked at 3.92 - resolved with IVF and resuscitation   Acute respiratory failure with hypoxia (HCC)-resolved as of 10/30/2020 - s/p intubation - now on RA   Old records reviewed in assessment of this patient  Antimicrobials:   DVT prophylaxis: enoxaparin (LOVENOX) injection 40 mg Start: 10/25/20 1400 Place and maintain sequential compression device Start: 10/22/20 0852 SCDs Start: 10/20/20 0026   Code Status:   Code Status: Full Code Family Communication:   Disposition Plan: Status is: Inpatient  Remains inpatient appropriate because:IV treatments appropriate due to intensity of illness or inability to take PO and Inpatient level of care appropriate due to severity of illness  Dispo: The patient is from: SNF              Anticipated d/c is to: SNF              Patient currently is not medically stable to d/c. Probably ready by 8/9   Difficult to place patient Yes Risk of unplanned readmission score: Unplanned Admission- Pilot do not use: 19.86   Objective: Blood pressure (!) 89/52, pulse 97, temperature 97.8 F (36.6 C), temperature source Oral, resp. rate 18, height 5' 7.99" (1.727 m), weight 109.3 kg, SpO2 97 %.  Examination: General appearance: alert, cooperative, no distress, and slowed mentation Head: Normocephalic, without obvious abnormality, atraumatic Eyes:  EOMI Lungs: clear to auscultation bilaterally Heart: regular rate and rhythm and S1, S2 normal Abdomen:  ostomy bag in place with decreased amount of brown stool. Penrose drain noted in lower abd incision; 25 staples in place; pelvic drain in place as well Extremities:  no edema Skin: mobility and turgor normal Neurologic:  IDD noted; no other focal deficits  Consultants:  Surgery  Procedures:    Data Reviewed: I have personally reviewed following labs and imaging studies Results for orders placed or performed during the hospital encounter of 10/19/20 (from the past 24 hour(s))  Glucose, capillary     Status: Abnormal   Collection Time: 11/03/20  4:25 PM  Result Value Ref Range   Glucose-Capillary 177 (H) 70 - 99 mg/dL  Glucose, capillary     Status: Abnormal   Collection Time: 11/03/20  9:23 PM  Result Value Ref Range   Glucose-Capillary 137 (H) 70 - 99 mg/dL  Basic metabolic panel     Status: Abnormal   Collection Time: 11/04/20  5:58 AM  Result Value Ref Range   Sodium 135 135 - 145 mmol/L   Potassium 4.2 3.5 - 5.1 mmol/L   Chloride 101 98 - 111 mmol/L   CO2 27 22 - 32 mmol/L   Glucose, Bld 77 70 - 99 mg/dL   BUN 22 8 - 23 mg/dL   Creatinine, Ser 1.19 0.61 - 1.24 mg/dL   Calcium 8.2 (L) 8.9 - 10.3 mg/dL   GFR, Estimated >60 >60 mL/min   Anion gap 7 5 - 15  CBC with Differential/Platelet     Status: Abnormal   Collection Time: 11/04/20  5:58 AM  Result Value Ref Range   WBC 10.9 (H) 4.0 - 10.5 K/uL   RBC 2.98 (L) 4.22 - 5.81 MIL/uL   Hemoglobin 8.2 (L) 13.0 - 17.0 g/dL   HCT 27.0 (L) 39.0 - 52.0 %   MCV 90.6 80.0 - 100.0 fL   MCH 27.5 26.0 - 34.0 pg   MCHC 30.4 30.0 - 36.0 g/dL   RDW 16.2 (H) 11.5 - 15.5 %   Platelets 454 (H) 150 - 400 K/uL   nRBC 0.2 0.0 - 0.2 %   Neutrophils Relative % 74 %   Neutro Abs 8.1 (H) 1.7 - 7.7 K/uL   Lymphocytes Relative 12 %   Lymphs Abs 1.3 0.7 - 4.0 K/uL   Monocytes Relative 7 %   Monocytes Absolute 0.8 0.1 - 1.0 K/uL   Eosinophils Relative 2 %   Eosinophils Absolute 0.2 0.0 - 0.5 K/uL   Basophils Relative 1 %   Basophils Absolute 0.1 0.0 - 0.1 K/uL   Immature Granulocytes 4 %   Abs Immature Granulocytes 0.38 (H) 0.00 - 0.07 K/uL  Magnesium     Status: None   Collection Time: 11/04/20  5:58 AM  Result Value Ref Range   Magnesium 1.7 1.7 - 2.4  mg/dL  Glucose, capillary     Status: None   Collection Time: 11/04/20  8:41 AM  Result Value Ref Range   Glucose-Capillary 70 70 - 99 mg/dL   Comment 1 Notify RN    Comment 2 Document in Chart   Glucose, capillary     Status: Abnormal   Collection Time: 11/04/20 11:47 AM  Result Value Ref Range   Glucose-Capillary 203 (H) 70 - 99 mg/dL   Comment 1 Notify RN    Comment 2 Document in Chart     Recent Results (from the past 240 hour(s))  C Difficile Quick Screen (NO PCR Reflex)     Status: None   Collection Time: 10/31/20  8:00 AM   Specimen: STOOL  Result Value Ref Range Status   C Diff antigen NEGATIVE NEGATIVE Final   C Diff toxin NEGATIVE NEGATIVE Final   C Diff interpretation No C. difficile detected.  Final    Comment: Performed at [facility], [place] [address] [place],  [address]     Radiology Studies: CT CHEST WO CONTRAST  Result Date: 11/02/2020 CLINICAL DATA:  Sigmoid colon cancer, status post resection, chest staging EXAM: CT CHEST WITHOUT CONTRAST TECHNIQUE: Multidetector CT imaging of the chest was performed following the standard protocol without IV contrast. COMPARISON:  None. FINDINGS: Cardiovascular: Aortic atherosclerosis. Right upper extremity PICC, with tip position near the superior cavoatrial junction. Cardiomegaly. Three-vessel coronary artery calcifications and/or stents. No pericardial effusion. Mediastinum/Nodes: No enlarged mediastinal, hilar, or axillary lymph nodes. Thyroid gland, trachea, and esophagus demonstrate no significant findings. Lungs/Pleura: Small right pleural effusion with associated atelectasis or consolidation. There is a 7 mm pulmonary nodule of the right upper lobe (series 5, image 49). Upper Abdomen: No acute abnormality.  Gallstones in the gallbladder. Musculoskeletal: No chest wall mass or suspicious bone lesions identified. IMPRESSION: 1. Small right pleural effusion with associated atelectasis or consolidation. 2.  There is a 7 mm pulmonary nodule of the right upper lobe, nonspecific although moderately suspicious for solitary pulmonary metastasis. Attention on follow-up. 3. Coronary artery disease. 4. Cholelithiasis. Aortic Atherosclerosis (ICD10-I70.0). Electronically Signed   By: Eddie Candle M.D.   On: 11/02/2020 19:56   CT CHEST WO CONTRAST  Final Result  CT ABDOMEN PELVIS W CONTRAST  Final Result    DG CHEST PORT 1 VIEW  Final Result    [place] EKG SITE RITE  Final Result    CT HEAD WO CONTRAST  Final Result    DG Abd 1 View  Final Result    DG CHEST PORT 1 VIEW  Final Result    DG Abd Portable 1V  Final Result    DG Chest Port 1 View  Final Result  Addendum (preliminary) 1 of 1  ADDENDUM REPORT: 10/24/2020 10:05    ADDENDUM:  Free air discussed by telephone with Dr. Eddie Dibbles STECHSCHULTE on  10/24/2020 at 1000 hours.      Electronically Signed    By: Genevie Ann M.D.    On: 10/24/2020 10:05      Final    DG Abd Portable 1V  Final Result    CT ABDOMEN PELVIS WO CONTRAST  Final Result    DG Chest Port 1 View  Final Result      Scheduled Meds:  (feeding supplement) PROSource Plus  30 mL Oral BID BM   amiodarone  200 mg Oral BID   amLODipine  5 mg Oral Daily   carvedilol  6.25 mg Oral BID WC   Chlorhexidine Gluconate Cloth  6 each Topical Daily   docusate sodium  100 mg Oral BID   enoxaparin (LOVENOX) injection  40 mg Subcutaneous Q24H   feeding supplement  237 mL Oral TID BM   insulin aspart  0-20 Units Subcutaneous TID WC   insulin aspart  0-5 Units Subcutaneous QHS   insulin aspart  5 Units Subcutaneous TID WC   insulin glargine-yfgn  40 Units Subcutaneous QHS   lisinopril  40 mg Oral Daily   loperamide  2 mg Oral TID   mouth rinse  15 mL Mouth Rinse BID   multivitamin with minerals  1 tablet Oral Daily   pneumococcal 23 valent vaccine  0.5 mL Intramuscular Tomorrow-1000   polycarbophil  625 mg Oral BID   sodium chloride flush  10-40 mL Intracatheter Q12H    sodium chloride flush  10-40 mL Intracatheter Q12H   PRN Meds: doxylamine (Sleep), fentaNYL (SUBLIMAZE) injection, hydrALAZINE, metoprolol tartrate, [DISCONTINUED] ondansetron **OR** ondansetron (ZOFRAN) IV, oxyCODONE, polyvinyl alcohol, sodium chloride flush, sodium chloride flush Continuous Infusions:  sodium chloride Stopped (10/31/20 1031)     LOS: 15 days  Time spent: Greater than 50% of the 35 minute visit was spent in counseling/coordination of care for the patient as laid out in the A&P.   Dwyane Dee, MD [facility] 11/04/2020, 2:45 PM

## 2020-11-05 LAB — CBC WITH DIFFERENTIAL/PLATELET
Abs Immature Granulocytes: 0.21 10*3/uL — ABNORMAL HIGH (ref 0.00–0.07)
Basophils Absolute: 0.1 10*3/uL (ref 0.0–0.1)
Basophils Relative: 1 %
Eosinophils Absolute: 0.3 10*3/uL (ref 0.0–0.5)
Eosinophils Relative: 3 %
HCT: 25.8 % — ABNORMAL LOW (ref 39.0–52.0)
Hemoglobin: 7.8 g/dL — ABNORMAL LOW (ref 13.0–17.0)
Immature Granulocytes: 2 %
Lymphocytes Relative: 17 %
Lymphs Abs: 1.7 10*3/uL (ref 0.7–4.0)
MCH: 27.3 pg (ref 26.0–34.0)
MCHC: 30.2 g/dL (ref 30.0–36.0)
MCV: 90.2 fL (ref 80.0–100.0)
Monocytes Absolute: 0.8 10*3/uL (ref 0.1–1.0)
Monocytes Relative: 8 %
Neutro Abs: 7.1 10*3/uL (ref 1.7–7.7)
Neutrophils Relative %: 69 %
Platelets: 418 10*3/uL — ABNORMAL HIGH (ref 150–400)
RBC: 2.86 MIL/uL — ABNORMAL LOW (ref 4.22–5.81)
RDW: 15.9 % — ABNORMAL HIGH (ref 11.5–15.5)
WBC: 10.2 10*3/uL (ref 4.0–10.5)
nRBC: 0 % (ref 0.0–0.2)

## 2020-11-05 LAB — GLUCOSE, CAPILLARY
Glucose-Capillary: 138 mg/dL — ABNORMAL HIGH (ref 70–99)
Glucose-Capillary: 232 mg/dL — ABNORMAL HIGH (ref 70–99)
Glucose-Capillary: 156 mg/dL — ABNORMAL HIGH (ref 70–99)
Glucose-Capillary: 164 mg/dL — ABNORMAL HIGH (ref 70–99)

## 2020-11-05 LAB — BASIC METABOLIC PANEL
Anion gap: 8 (ref 5–15)
BUN: 24 mg/dL — ABNORMAL HIGH (ref 8–23)
CO2: 27 mmol/L (ref 22–32)
Calcium: 8 mg/dL — ABNORMAL LOW (ref 8.9–10.3)
Chloride: 99 mmol/L (ref 98–111)
Creatinine, Ser: 1.1 mg/dL (ref 0.61–1.24)
GFR, Estimated: >60 mL/min (ref >60)
Glucose, Bld: 116 mg/dL — ABNORMAL HIGH (ref 70–99)
Potassium: 4.3 mmol/L (ref 3.5–5.1)
Sodium: 134 mmol/L — ABNORMAL LOW (ref 135–145)

## 2020-11-05 LAB — MAGNESIUM: Magnesium: 2 mg/dL (ref 1.7–2.4)

## 2020-11-05 MED ORDER — LISINOPRIL 20 MG PO TABS
20.0000 mg | ORAL_TABLET | Freq: Every day | ORAL | Status: DC
  Administered 2020-11-06: 20 mg via ORAL
  Filled 2020-11-05 (×2): qty 1

## 2020-11-05 NOTE — Progress Notes (Signed)
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 Patient developed free air and required a right colectomy with anastomosis and left colectomy with end colostomy on 10/24/2020.   OT comments  Patient demonstrating improved sitting balance at edge of bed, no longer note strong R lean. Patient does have initial posterior lean however with cues to correct able to progress to supervision level with B UE support. Due to body habitus patient has increased difficulty with rolling needing max A and multimodal cues to initiate reaching for bed rail to roll toward R side and max A to upright trunk. Note patient's bed soiled with urine, patient needing mod A x2 to power up to standing, mod A x1 to maintain standing for ~20 mins while OT provide total A for perianal care. Used stedy to transfer to recliner, patient able to tolerate static stand with mod A for additional ~10 seconds to complete peri care in front before transferred to recliner. Patient making progress towards acute OT goals, hopeful to D/C to rehab today.    Follow Up Recommendations  SNF    Equipment Recommendations  None recommended by OT       Precautions / Restrictions Precautions Precautions: Fall Precaution Comments: abdominal incision, colostomy, JP drain       Mobility Bed Mobility Overal bed mobility: Needs Assistance Bed Mobility: Rolling;Sidelying to Sit Rolling: Max assist Sidelying to sit: Max assist       General bed mobility comments: patient able to bring legs towards edge of bed, needs multimodal cues to initiate reaching with UEs and max A to roll    Transfers Overall transfer level: Needs assistance   Transfers: Sit to/from Stand;Stand Pivot  Transfers Sit to Stand: Mod assist;+2 physical assistance;+2 safety/equipment;From elevated surface Stand pivot transfers: Total assist       General transfer comment: mod x2 to power up to standing, able to tolerate static stand for ~20 seconds and mod A from tech, then after seated rest of stedy tolerate another ~10 seconds to complete peri care before transferring into recliner    Balance Overall balance assessment: Needs assistance Sitting-balance support: Feet supported Sitting balance-Leahy Scale: Poor Sitting balance - Comments: posterior lean in sitting needing cues to correct, progresses to supervision with B UE supporting in static sitting Postural control: Posterior lean Standing balance support: Bilateral upper extremity supported Standing balance-Leahy Scale: Poor                             ADL either performed or assessed with clinical judgement   ADL Overall ADL's : Needs assistance/impaired                     Lower Body Dressing: Total assistance;Bed level Lower Body Dressing Details (indicate cue type and reason): to don socks Toilet Transfer: +2 for physical assistance;+2 for safety/equipment;Total assistance Toilet Transfer Details (indicate cue type and reason): use of stedy to get to recliner, mod A x2 to power up to standing from edge of bed to transfer into stedy Toileting- Clothing Manipulation and Hygiene: Total assistance;+2 for physical assistance;+2 for safety/equipment;Sit to/from stand Toileting - Clothing Manipulation Details (indicate cue type and reason): note patient soiled with urine, total A  for perianal care while tech support patient in standing. fatigues after ~20 seconds. needs cues for posture to upright trunk and activate glute muscles to stand more upright.       General ADL Comments: improved ability to power up to standing and did not note lateral lean this session      Cognition Arousal/Alertness:  Awake/alert Behavior During Therapy: WFL for tasks assessed/performed Overall Cognitive Status: History of cognitive impairments - at baseline                                                     Pertinent Vitals/ Pain       Pain Assessment: Faces Faces Pain Scale: Hurts a little bit Pain Location: LE's and abdomen with activity Pain Descriptors / Indicators: Discomfort;Grimacing;Tender Pain Intervention(s): Monitored during session   Frequency  Min 2X/week        Progress Toward Goals  OT Goals(current goals can now be found in the care plan section)  Progress towards OT goals: Progressing toward goals  Acute Rehab OT Goals Patient Stated Goal: Get up to chair OT Goal Formulation: With patient Time For Goal Achievement: 11/09/20 Potential to Achieve Goals: Fair ADL Goals Pt Will Perform Upper Body Bathing: with set-up;sitting Pt Will Perform Upper Body Dressing: with set-up;sitting Pt Will Perform Lower Body Dressing: with mod assist;sit to/from stand;with adaptive equipment Pt Will Transfer to Toilet: with min assist;stand pivot transfer;bedside commode Pt Will Perform Toileting - Clothing Manipulation and hygiene: with min guard assist Additional ADL Goal #1: Patient will stand without prominant right lateral lean x 1 min with use of RW. Additional ADL Goal #2: Patient will stand at sink to perform grooming task as evidence of improving activity tolerance  Plan Discharge plan remains appropriate       AM-PAC OT "6 Clicks" Daily Activity     Outcome Measure   Help from another person eating meals?: A Little Help from another person taking care of personal grooming?: A Little Help from another person toileting, which includes using toliet, bedpan, or urinal?: Total Help from another person bathing (including washing, rinsing, drying)?: A Lot Help from another person to put on and taking off regular upper body clothing?: A Lot Help from another  person to put on and taking off regular lower body clothing?: Total 6 Click Score: 12    End of Session Equipment Utilized During Treatment: Gait belt (stedy)  OT Visit Diagnosis: Unsteadiness on feet (R26.81);Muscle weakness (generalized) (M62.81)   Activity Tolerance Patient tolerated treatment well   Patient Left in chair;with call bell/phone within reach;with chair alarm set   Nurse Communication Mobility status;Other (comment) (soiled bed linens/completed peri care)        Time: [id number] OT Time Calculation (min): 18 min  Charges: OT General Charges $OT Visit: 1 Visit OT Treatments $Self Care/Home Management : 8-22 mins  Delbert Phenix OT OT pager: [place] 11/05/2020, 1:00 PM

## 2020-11-05 NOTE — Plan of Care (Signed)

## 2020-11-05 NOTE — Progress Notes (Signed)
Progress Note  12 Days Post-Op  Subjective: Patient denies abdominal pain. Denies nausea and reports he is hungry this AM.   Objective: Vital signs in last 24 hours: Temp:  [97.8 F (36.6 C)-98.4 F (36.9 C)] 98.3 F (36.8 C) (08/09 0511) Pulse Rate:  [97-104] 99 (08/09 0837) Resp:  [18-20] 18 (08/09 0511) BP: (89-128)/(52-65) 115/58 (08/09 0837) SpO2:  [96 %-100 %] 96 % (08/09 0511) Last BM Date: 11/04/20  Intake/Output from previous day: 08/08 0701 - 08/09 0700 In: 250 [P.O.:240; I.V.:10] Out: 67 [Urine:200; Drains:110; Stool:620] Intake/Output this shift: Total I/O In: 360 [P.O.:360] Out: -   PE: General: pleasant, WD, obese male who is laying in bed in NAD Heart: regular, rate, and rhythm.   Lungs: CTAB, no wheezes, rhonchi, or rales noted.  Respiratory effort nonlabored Abd: soft, NT, ND, +BS, stoma viable with soft brown stool in ostomy appliance, drain SS and removed at bedside without complication, staples and penrose drain removed from midline incision without complication, small to moderate amt of drainage from penrose site    Lab Results:  Recent Labs    11/04/20 0558 11/05/20 0230  WBC 10.9* 10.2  HGB 8.2* 7.8*  HCT 27.0* 25.8*  PLT 454* 418*   BMET Recent Labs    11/04/20 0558 11/05/20 0230  NA 135 134*  K 4.2 4.3  CL 101 99  CO2 27 27  GLUCOSE 77 116*  BUN 22 24*  CREATININE 1.19 1.10  CALCIUM 8.2* 8.0*   PT/INR No results for input(s): LABPROT, INR in the last 72 hours. CMP     Component Value Date/Time   NA 134 (L) 11/05/2020 0230   K 4.3 11/05/2020 0230   CL 99 11/05/2020 0230   CO2 27 11/05/2020 0230   GLUCOSE 116 (H) 11/05/2020 0230   BUN 24 (H) 11/05/2020 0230   CREATININE 1.10 11/05/2020 0230   CALCIUM 8.0 (L) 11/05/2020 0230   PROT 5.3 (L) 10/28/2020 0456   ALBUMIN 1.7 (L) 10/28/2020 0456   AST 20 10/28/2020 0456   ALT 18 10/28/2020 0456   ALKPHOS 53 10/28/2020 0456   BILITOT 0.4 10/28/2020 0456   GFRNONAA >60  11/05/2020 0230   GFRAA >60 03/24/2017 0238   Lipase  No results found for: LIPASE     Studies/Results: No results found.  Anti-infectives: Anti-infectives (From admission, onward)    Start     Dose/Rate Route Frequency Ordered Stop   10/27/20 1400  ceFEPIme (MAXIPIME) 2 g in sodium chloride 0.9 % 100 mL IVPB        2 g 200 mL/hr over 30 Minutes Intravenous Every 8 hours 10/27/20 0927 10/27/20 2250   10/25/20 1400  ceFEPIme (MAXIPIME) 2 g in sodium chloride 0.9 % 100 mL IVPB  Status:  Discontinued        2 g 200 mL/hr over 30 Minutes Intravenous Every 12 hours 10/25/20 0729 10/27/20 0927   10/23/20 1000  ceFEPIme (MAXIPIME) 2 g in sodium chloride 0.9 % 100 mL IVPB  Status:  Discontinued        2 g 200 mL/hr over 30 Minutes Intravenous Every 8 hours 10/23/20 0833 10/25/20 0729   10/21/20 1000  ceFEPIme (MAXIPIME) 2 g in sodium chloride 0.9 % 100 mL IVPB  Status:  Discontinued        2 g 200 mL/hr over 30 Minutes Intravenous Every 12 hours 10/21/20 0742 10/23/20 0833   10/21/20 0600  metroNIDAZOLE (FLAGYL) IVPB 500 mg  500 mg 100 mL/hr over 60 Minutes Intravenous Every 8 hours 10/20/20 0028 10/28/20 0759   10/20/20 2200  ceFEPIme (MAXIPIME) 2 g in sodium chloride 0.9 % 100 mL IVPB  Status:  Discontinued        2 g 200 mL/hr over 30 Minutes Intravenous Every 24 hours 10/20/20 0048 10/21/20 0742   10/19/20 2030  ceFEPIme (MAXIPIME) 2 g in sodium chloride 0.9 % 100 mL IVPB        2 g 200 mL/hr over 30 Minutes Intravenous  Once 10/19/20 2029 10/20/20 0033   10/19/20 2030  metroNIDAZOLE (FLAGYL) IVPB 500 mg        500 mg 100 mL/hr over 60 Minutes Intravenous  Once 10/19/20 2029 10/19/20 2210        Assessment/Plan POD 12, S/P right colectomy with anastomosis, left colectomy with end colostomy, mobilization of splenic flexure 10/24/20 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 Follow up arranged and in AVS Severe protein calorie malnutrition - prealbumin 7.6 (8/1), PO intake improving   FEN: SOFT diet, ensure, imodium TID, fiber BID VTE: LMWH ID: [email or web address] 7/23>8/1   T2DM HTN CKD stage III Intellectual disability residing at [facility]  LOS: 16 days    Norm Parcel, [facility] 11/05/2020, 9:15 AM Please see Amion for pager number during day hours 7:00am-4:30pm

## 2020-11-05 NOTE — Progress Notes (Signed)
Pt dressing changed. Colostomy changed. Will continue monitor the pt.

## 2020-11-05 NOTE — Progress Notes (Signed)
Progress Note    Mark Chang   [id number]  DOB: 1945/04/15  DOA: 10/19/2020     16  PCP: Terrill Mohr, NP  CC: abdominal pain  Hospital Course: Mark Chang is a 75 yo male with PMH type 2 diabetes mellitus, essential hypertension, CKD stage IIIa, intellectual disability who resides at SNF who initially presented to [facility] ED with complaint of abdominal pain, diarrhea and was found to be hypotensive.  Per SNF staff, he reported abdominal pain for the last few weeks.     In the ED, he was found to have a blood pressure of 68/40, temperature 100.5 F, HR 105, lactic acid 2.2, WBC count 22.7.   CT abdomen/pelvis showed minimal amount of free air, cecal mass probably sigmoid colon with concern for perforated viscus.  GI and general surgery were consulted.  Patient admitted for management of abdominal pain secondary to cecal mass with likely perforated viscus.  Patient was initially monitored with conservative management, could not get a hold of family or legal guardian.  However, patient continued to clinically decline and patient ultimately underwent right colectomy with anastomosis, left colectomy with end colostomy by [name] on 7/28.  Postoperatively, patient remained in respiratory acidosis and was left on mechanical ventilation, required pressors.  He was transferred to ICU.  He was extubated 7/29 and transferred back to [facility] service on 7/30.  TPN was started 7/31.  Pathology from surgery returned.  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  Interval History:  No events overnight.  Sitting up in recliner bedside.  Still no issues or concerns.  Pelvic drain and Penrose drain were removed this morning along with staples with  surgery.  ROS: Constitutional: negative for chills and fevers, Respiratory: negative for cough and sputum, Cardiovascular: negative for chest pain, and Gastrointestinal: negative for abdominal pain  Assessment & Plan: * Perforated abdominal viscus-resolved as of 11/03/2020 -Status post right colectomy with anastomosis, left colectomy with end colostomy by [name] 7/28 -s/p TPN; weaned off on 10/29/20 - tolerating soft diet -General surgery following - leukocytosis still present on 8/3; CT A/P ordered per surgery and negative for abscess; leukocytosis has been slowly downtrending off abx - per surgery, once stool output <1000 cc; as of 8/6 output starting to decrease, will need to trend to ensure stable (1835>>600>>475>>300) - pelvic drain, penrose drain, and staples removed on 8/9; stable for d/c from a surgery standpoint   Septic shock (HCC)-resolved as of 10/30/2020 - s/p ICU with pressors from hypovolemic shock - now improved and resovled  Diarrhea - stool output very liquidy on 8/4; cdiff checked and negative on 8/4 - continue imodium and fibercon - see perforated viscous   Intellectual disability - was at [employer] [facility]; tentative plan is for [facility] in [place] inquiring if SNF can sign patient in - pending POA help for MDM - patient is stable for discharge when bed available and approved/able to go  Colon obstruction (HCC)-resolved as of 11/03/2020 - per surgical pathology: Left colon resection revealed invasive moderately differentiated adenocarcinoma involving sigmoid colon.  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 - discussed with oncology; he can have outpatient followup once healed from surgery; CT chest ordered 8/6 to complete staging  - patient has IDD and  unable to contact brother or POA; SW/CM assisting with learning who can help patient make medical decisions on next steps too  DM (diabetes mellitus), type 2 with renal complications  ([place]) - continue SSI and CBG monitoring   Acute renal failure superimposed on stage 3a chronic kidney disease (HCC)-resolved as of 10/30/2020 - patient has history of CKD3a. Baseline creat ~ 1.3, eGFR 48 - patient presents with increase in creat >0.3 mg/dL above baseline, creat increase >1.5x baseline presumed to have occurred within past 7 days PTA - peaked at 3.92 - resolved with IVF and resuscitation   Acute respiratory failure with hypoxia (HCC)-resolved as of 10/30/2020 - s/p intubation - now on RA   Old records reviewed in assessment of this patient  Antimicrobials:   DVT prophylaxis: enoxaparin (LOVENOX) injection 40 mg Start: 10/25/20 1400 Place and maintain sequential compression device Start: 10/22/20 0852 SCDs Start: 10/20/20 0026   Code Status:   Code Status: Full Code Family Communication:   Disposition Plan: Status is: Inpatient  Remains inpatient appropriate because:IV treatments appropriate due to intensity of illness or inability to take PO and Inpatient level of care appropriate due to severity of illness  Dispo: The patient is from: SNF              Anticipated d/c is to: SNF              Patient currently is medically stable to d/c.  Can go when a SNF is able to accept him   Difficult to place patient Yes Risk of unplanned readmission score: Unplanned Admission- Pilot do not use: 22.11   Objective: Blood pressure (!) 102/49, pulse (!) 102, temperature 98.5 F (36.9 C), temperature source Oral, resp. rate 20, height 5' 7.99" (1.727 m), weight 109.3 kg, SpO2 98 %.  Examination: General appearance: alert, cooperative, no distress, and slowed mentation Head: Normocephalic, without obvious abnormality, atraumatic Eyes:  EOMI Lungs: clear to auscultation bilaterally Heart: regular rate and rhythm and S1, S2 normal Abdomen: Ostomy bag in place with mixture of solid and liquid brown stool overall decreased in frequency.  Pelvic drain removed and Penrose drain  removed.  All staples have been removed Extremities:  no edema Skin: mobility and turgor normal Neurologic: IDD noted; no other focal deficits  Consultants:  Surgery  Procedures:    Data Reviewed: I have personally reviewed following labs and imaging studies Results for orders placed or performed during the hospital encounter of 10/19/20 (from the past 24 hour(s))  Glucose, capillary     Status: Abnormal   Collection Time: 11/04/20  4:39 PM  Result Value Ref Range   Glucose-Capillary 172 (H) 70 - 99 mg/dL  Glucose, capillary     Status: Abnormal   Collection Time: 11/04/20  8:25 PM  Result Value Ref Range   Glucose-Capillary 149 (H) 70 - 99 mg/dL  Basic metabolic panel     Status: Abnormal   Collection Time: 11/05/20  2:30 AM  Result Value Ref Range   Sodium 134 (L) 135 - 145 mmol/L   Potassium 4.3 3.5 - 5.1 mmol/L   Chloride 99 98 - 111 mmol/L   CO2 27 22 - 32 mmol/L   Glucose, Bld 116 (H) 70 - 99 mg/dL   BUN 24 (H) 8 - 23 mg/dL   Creatinine, Ser 1.10 0.61 - 1.24 mg/dL   Calcium 8.0 (L) 8.9 - 10.3 mg/dL   GFR, Estimated >60 >60 mL/min   Anion gap 8 5 - 15  CBC  with Differential/Platelet     Status: Abnormal   Collection Time: 11/05/20  2:30 AM  Result Value Ref Range   WBC 10.2 4.0 - 10.5 K/uL   RBC 2.86 (L) 4.22 - 5.81 MIL/uL   Hemoglobin 7.8 (L) 13.0 - 17.0 g/dL   HCT 25.8 (L) 39.0 - 52.0 %   MCV 90.2 80.0 - 100.0 fL   MCH 27.3 26.0 - 34.0 pg   MCHC 30.2 30.0 - 36.0 g/dL   RDW 15.9 (H) 11.5 - 15.5 %   Platelets 418 (H) 150 - 400 K/uL   nRBC 0.0 0.0 - 0.2 %   Neutrophils Relative % 69 %   Neutro Abs 7.1 1.7 - 7.7 K/uL   Lymphocytes Relative 17 %   Lymphs Abs 1.7 0.7 - 4.0 K/uL   Monocytes Relative 8 %   Monocytes Absolute 0.8 0.1 - 1.0 K/uL   Eosinophils Relative 3 %   Eosinophils Absolute 0.3 0.0 - 0.5 K/uL   Basophils Relative 1 %   Basophils Absolute 0.1 0.0 - 0.1 K/uL   Immature Granulocytes 2 %   Abs Immature Granulocytes 0.21 (H) 0.00 - 0.07 K/uL   Magnesium     Status: None   Collection Time: 11/05/20  2:30 AM  Result Value Ref Range   Magnesium 2.0 1.7 - 2.4 mg/dL  Glucose, capillary     Status: Abnormal   Collection Time: 11/05/20  7:30 AM  Result Value Ref Range   Glucose-Capillary 138 (H) 70 - 99 mg/dL   Comment 1 Notify RN    Comment 2 Document in Chart   Glucose, capillary     Status: Abnormal   Collection Time: 11/05/20 11:32 AM  Result Value Ref Range   Glucose-Capillary 232 (H) 70 - 99 mg/dL   Comment 1 Notify RN    Comment 2 Document in Chart     Recent Results (from the past 240 hour(s))  C Difficile Quick Screen (NO PCR Reflex)     Status: None   Collection Time: 10/31/20  8:00 AM   Specimen: STOOL  Result Value Ref Range Status   C Diff antigen NEGATIVE NEGATIVE Final   C Diff toxin NEGATIVE NEGATIVE Final   C Diff interpretation No C. difficile detected.  Final    Comment: Performed at [facility], [place] [address] [place], [place] [address]     Radiology Studies: No results found. CT CHEST WO CONTRAST  Final Result    CT ABDOMEN PELVIS W CONTRAST  Final Result    DG CHEST PORT 1 VIEW  Final Result    [place] EKG SITE RITE  Final Result    CT HEAD WO CONTRAST  Final Result    DG Abd 1 View  Final Result    DG CHEST PORT 1 VIEW  Final Result    DG Abd Portable 1V  Final Result    DG Chest Port 1 View  Final Result  Addendum (preliminary) 1 of 1  ADDENDUM REPORT: 10/24/2020 10:05    ADDENDUM:  Free air discussed by telephone with Dr. Eddie Dibbles STECHSCHULTE on  10/24/2020 at 1000 hours.      Electronically Signed    By: Genevie Ann M.D.    On: 10/24/2020 10:05      Final    DG Abd Portable 1V  Final Result    CT ABDOMEN PELVIS WO CONTRAST  Final Result    DG Chest [facility] 1 View  Final Result  Scheduled Meds:  (feeding supplement) PROSource Plus  30 mL Oral BID BM   amiodarone  200 mg Oral BID   carvedilol  6.25 mg Oral BID WC   Chlorhexidine Gluconate  Cloth  6 each Topical Daily   docusate sodium  100 mg Oral BID   enoxaparin (LOVENOX) injection  40 mg Subcutaneous Q24H   feeding supplement  237 mL Oral TID BM   insulin aspart  0-20 Units Subcutaneous TID WC   insulin aspart  0-5 Units Subcutaneous QHS   insulin aspart  5 Units Subcutaneous TID WC   insulin glargine-yfgn  40 Units Subcutaneous QHS   lisinopril  20 mg Oral Daily   loperamide  2 mg Oral TID   mouth rinse  15 mL Mouth Rinse BID   multivitamin with minerals  1 tablet Oral Daily   pneumococcal 23 valent vaccine  0.5 mL Intramuscular Tomorrow-1000   polycarbophil  625 mg Oral BID   sodium chloride flush  10-40 mL Intracatheter Q12H   sodium chloride flush  10-40 mL Intracatheter Q12H   PRN Meds: doxylamine (Sleep), fentaNYL (SUBLIMAZE) injection, hydrALAZINE, [DISCONTINUED] ondansetron **OR** ondansetron (ZOFRAN) IV, oxyCODONE, polyvinyl alcohol, sodium chloride flush, sodium chloride flush Continuous Infusions:  sodium chloride Stopped (10/31/20 1031)     LOS: 16 days  Time spent: Greater than 50% of the 35 minute visit was spent in counseling/coordination of care for the patient as laid out in the A&P.   Dwyane Dee, MD [facility] 11/05/2020, 3:26 PM

## 2020-11-06 LAB — CBC WITH DIFFERENTIAL/PLATELET
Abs Immature Granulocytes: 0.16 10*3/uL — ABNORMAL HIGH (ref 0.00–0.07)
Basophils Absolute: 0.1 10*3/uL (ref 0.0–0.1)
Basophils Relative: 1 %
Eosinophils Absolute: 0.2 10*3/uL (ref 0.0–0.5)
Eosinophils Relative: 2 %
HCT: 27.1 % — ABNORMAL LOW (ref 39.0–52.0)
Hemoglobin: 8.3 g/dL — ABNORMAL LOW (ref 13.0–17.0)
Immature Granulocytes: 2 %
Lymphocytes Relative: 8 %
Lymphs Abs: 0.7 10*3/uL (ref 0.7–4.0)
MCH: 27.5 pg (ref 26.0–34.0)
MCHC: 30.6 g/dL (ref 30.0–36.0)
MCV: 89.7 fL (ref 80.0–100.0)
Monocytes Absolute: 0.5 10*3/uL (ref 0.1–1.0)
Monocytes Relative: 6 %
Neutro Abs: 6.7 10*3/uL (ref 1.7–7.7)
Neutrophils Relative %: 81 %
Platelets: 408 10*3/uL — ABNORMAL HIGH (ref 150–400)
RBC: 3.02 MIL/uL — ABNORMAL LOW (ref 4.22–5.81)
RDW: 15.9 % — ABNORMAL HIGH (ref 11.5–15.5)
WBC: 8.2 10*3/uL (ref 4.0–10.5)
nRBC: 0 % (ref 0.0–0.2)

## 2020-11-06 LAB — RESP PANEL BY RT-PCR (FLU A&B, COVID) ARPGX2
Influenza A by PCR: NEGATIVE
Influenza B by PCR: NEGATIVE
SARS Coronavirus 2 by RT PCR: NEGATIVE

## 2020-11-06 LAB — BASIC METABOLIC PANEL
Anion gap: 10 (ref 5–15)
BUN: 27 mg/dL — ABNORMAL HIGH (ref 8–23)
CO2: 26 mmol/L (ref 22–32)
Calcium: 8.1 mg/dL — ABNORMAL LOW (ref 8.9–10.3)
Chloride: 100 mmol/L (ref 98–111)
Creatinine, Ser: 1.2 mg/dL (ref 0.61–1.24)
GFR, Estimated: >60 mL/min (ref >60)
Glucose, Bld: 144 mg/dL — ABNORMAL HIGH (ref 70–99)
Potassium: 4.7 mmol/L (ref 3.5–5.1)
Sodium: 136 mmol/L (ref 135–145)

## 2020-11-06 LAB — GLUCOSE, CAPILLARY
Glucose-Capillary: 107 mg/dL — ABNORMAL HIGH (ref 70–99)
Glucose-Capillary: 127 mg/dL — ABNORMAL HIGH (ref 70–99)
Glucose-Capillary: 163 mg/dL — ABNORMAL HIGH (ref 70–99)

## 2020-11-06 LAB — MAGNESIUM: Magnesium: 1.8 mg/dL (ref 1.7–2.4)

## 2020-11-06 MED ORDER — PROSOURCE PLUS PO LIQD: 30.0000 mL | Freq: Two times a day (BID) | ORAL | Status: DC

## 2020-11-06 MED ORDER — NOVOLOG FLEXPEN 100 UNIT/ML ~~LOC~~ SOPN: 5.0000 [IU] | PEN_INJECTOR | Freq: Three times a day (TID) | SUBCUTANEOUS | 11 refills | Status: DC

## 2020-11-06 MED ORDER — ADULT MULTIVITAMIN W/MINERALS CH: 1.0000 | ORAL_TABLET | Freq: Every day | ORAL | Status: DC

## 2020-11-06 MED ORDER — ACETAMINOPHEN 325 MG PO TABS
650.0000 mg | ORAL_TABLET | Freq: Four times a day (QID) | ORAL | Status: DC | PRN
  Administered 2020-11-06: 650 mg via ORAL
  Filled 2020-11-06: qty 2

## 2020-11-06 MED ORDER — LOPERAMIDE HCL 2 MG PO CAPS: 2.0000 mg | ORAL_CAPSULE | Freq: Three times a day (TID) | ORAL | 0 refills | Status: DC

## 2020-11-06 MED ORDER — LISINOPRIL 20 MG PO TABS: 20.0000 mg | ORAL_TABLET | Freq: Every day | ORAL | Status: DC

## 2020-11-06 MED ORDER — CALCIUM POLYCARBOPHIL 625 MG PO TABS: 625.0000 mg | ORAL_TABLET | Freq: Two times a day (BID) | ORAL | Status: DC

## 2020-11-06 MED ORDER — ENSURE ENLIVE PO LIQD
237.0000 mL | Freq: Three times a day (TID) | ORAL | 12 refills | Status: DC
Start: 2020-11-06 — End: 2022-08-12

## 2020-11-06 MED ORDER — BASAGLAR KWIKPEN 100 UNIT/ML ~~LOC~~ SOPN: 40.0000 [IU] | PEN_INJECTOR | Freq: Every day | SUBCUTANEOUS | Status: DC

## 2020-11-06 NOTE — Discharge Summary (Signed)
Physician Discharge Summary  Mark Chang [id number] DOB: [birth year] DOA: 10/19/2020  PCP: Terrill Mohr, NP  Admit date: 10/19/2020 Discharge date: 11/06/2020  Admitted From: snf Disposition:  snf  Recommendations for Outpatient Follow-up:  Follow up with PCP in 1-2 weeks and  Please obtain BMP/CBC in one week Follow-up with general surgery as outpatient  Home Health:no  Equipment/Devices: none  Discharge Condition: Stable Code Status:   Code Status: Full Code Diet recommendation:  Diet Order             DIET SOFT Room service appropriate? 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 In the ED, he was found to have a blood pressure of 68/40, temperature 100.5 F, HR 105, lactic acid 2.2, WBC count 22.7.   CT abdomen/pelvis showed minimal amount of free air, cecal mass probably sigmoid colon with concern for perforated viscus.  GI and general surgery were consulted.  Patient was admitted for management of abdominal pain secondary to cecal mass with likely perforated viscus.  Patient was initially monitored with conservative management, could not get a hold of family or legal guardian.  However, patient continued to clinically decline and patient ultimately underwent right colectomy with anastomosis, left colectomy with end colostomy by [name] on 7/28.  Postoperatively, patient remained in respiratory acidosis and was left on mechanical ventilation, required pressors.  He was transferred to ICU.  He was extubated 7/29 and transferred back to [facility] service on 7/30.  TPN was started 7/31. Pathology from surgery returned.  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 Patient was seen by surgery.  Patient was managed for septic shock perforated abdominal viscus and at this time has resolved.  He remains clinically stable, tolerating soft diet, stool output in the colostomy bag. Oncology was discussed will be following up as outpatient patient had CT chest for staging on 8 6 that showed 7 mm pulmonary nodule in the right upper lobe nonspecific, no other acute findings Patient is waiting for placement pending insurance approval at this time we have consulted approval and is being discharged to skilled nursing facility 8/10  Discharge Diagnoses:   Perforated abdominal viscus-resolved as of 11/03/2020 Status post right colectomy with anastomosis, left colectomy with end colostomy by [name] 7/28.-s/p TPN; weaned off on 10/29/20, now on Soft diet.  Pelvic drain, Penrose drain and staples removed 8/9.  seen by general surgery plan is for outpatient follow-up.t    Septic shock resolved as of 10/30/2020.  Was in ICU on pressors  Diarrhea: Continue Imodium/FiberCon  Intellectual disability:was at [employer]; tentative plan is for [facility] in [place].    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 Dr Verline Lema discussed with oncology; he can have outpatient followup once healed from surgery; CT chest ordered 8/6 to complete staging- no obvious mets. patient  has IDD and unable to contact brother or POA; SW/CM assisting with learning who can help patient make medical decisions on next steps too   DM (diabetes mellitus), type 2 with renal complications: stable.  Patient will  continue Levemir 40 units at bedtime, NovoLog 5 units Premeal and sliding scale Acute renal failure superimposed on stage 3a chronic kidney disease-resolved  Acute respiratory failure with hypoxia-resolved as of 10/30/2020  Consults: PCCM [facility] Oncology  Subjective: Alert awake tolerating diet no pain.  Colostomy awake still output. Discharge Exam: Vitals:   11/06/20 1300 11/06/20 1333  BP:  (!) (P) 128/57  Pulse:  (!) (P) 101  Resp:    Temp:  (P) 98.4 F (36.9 C)  SpO2: (P) 99% (P) 99%   General: Pt is alert, awake, not in acute distress Cardiovascular: RRR, S1/S2 +, no rubs, no gallops Respiratory: CTA bilaterally, no wheezing, no rhonchi Abdominal: Soft, NT, ND, bowel sounds + Extremities: no edema, no cyanosis  Discharge Instructions  Discharge Instructions     Ambulatory referral to Hematology / Oncology   Complete by: As directed    Discharge instructions   Complete by: As directed    You will need to follow-up with oncology please call the office   Discharge wound care:   Complete by: As directed    Apply dry gauze over penrose drain at inferior part of abdominal incision. Change at least every shift and more often as needed if saturated.   Increase activity slowly   Complete by: As directed       Allergies as of 11/06/2020   No Known Allergies      Medication List     STOP taking these medications    dicyclomine 20 MG tablet Commonly known as: BENTYL   Fenofibrate 50 MG Caps   furosemide 40 MG tablet Commonly known as: LASIX   potassium chloride SA 20 MEQ tablet Commonly known as: KLOR-CON       TAKE these medications    acetaminophen 500 MG tablet Commonly known as: TYLENOL Take 500 mg by mouth 3 (three) times daily.   atorvastatin 40 MG tablet Commonly known as: LIPITOR Take 40 mg by mouth at bedtime.   Basaglar KwikPen 100 UNIT/ML Inject 40 Units into the skin at bedtime. What changed:  when to take this Another medication with  the same name was removed. Continue taking this medication, and follow the directions you see here.   dextrose 40 % Gel Commonly known as: GLUTOSE Take 1 Tube by mouth once as needed for low blood sugar.   docusate sodium 100 MG capsule Commonly known as: COLACE Take 100 mg by mouth 2 (two) times daily.   escitalopram 10 MG tablet Commonly known as: LEXAPRO Take 10 mg by mouth daily.   famotidine 20 MG tablet Commonly known as: PEPCID Take 20 mg by mouth 2 (two) times daily.   feeding supplement Liqd Take 237 mLs by mouth 3 (three) times daily between meals.   (feeding supplement) PROSource Plus liquid Take 30 mLs by mouth 2 (two) times daily between meals.   gabapentin 100 MG capsule Commonly known as: NEURONTIN Take 100 mg by mouth 2 (two) times daily.   lisinopril 20 MG tablet Commonly known as: ZESTRIL Take 1 tablet (20 mg total) by mouth daily. Start taking on: November 07, 2020 What changed:  medication strength how much to take   loperamide 2 MG capsule Commonly known as: IMODIUM Take 1 capsule (2 mg total) by mouth  3 (three) times daily.   Melatonin 5 MG Subl Take 10 mg by mouth at bedtime.   multivitamin with minerals Tabs tablet Take 1 tablet by mouth daily. Start taking on: November 07, 2020   NovoLOG FlexPen 100 UNIT/ML FlexPen Generic drug: insulin aspart Inject 5 Units into the skin 3 (three) times daily with meals. What changed: how much to take   polycarbophil 625 MG tablet Commonly known as: FIBERCON Take 1 tablet (625 mg total) by mouth 2 (two) times daily.   polyethylene glycol powder 17 GM/SCOOP powder Commonly known as: GLYCOLAX/MIRALAX Take 17 g by mouth 2 (two) times daily as needed for mild constipation or moderate constipation. Mix with 8 oz of liquid   Trulicity 1.5 0000000 Sopn Generic drug: Dulaglutide Inject 1.5 mg into the skin every Thursday.   vitamin B-12 1000 MCG tablet Commonly known as: CYANOCOBALAMIN Take 1,000 mcg by  mouth daily.       ASK your doctor about these medications    insulin aspart 100 UNIT/ML injection Commonly known as: novoLOG CBG 70 - 120: 0 units CBG 121 - 150: 2 units CBG 151 - 200: 3 units CBG 201 - 250: 5 units CBG 251 - 300: 8 units CBG 301 - 350: 11 units CBG 351 - 400: 15 units               Discharge Care Instructions  (From admission, onward)           Start     Ordered   11/06/20 0000  Discharge wound care:       Comments: Apply dry gauze over penrose drain at inferior part of abdominal incision. Change at least every shift and more often as needed if saturated.   11/06/20 1355            Contact information for follow-up providers     Jerline Pain, MD Follow up.   Specialty: Cardiology Why: cardiology scheduler will contact you for follow up, please give [place] a call if you do not hear from our scheduler in 3 business days. Contact information: [id number] [place] [address] [phone]         Stechschulte, Nickola Major, MD. Go on 11/20/2020.   Specialty: Surgery Why: 9:20 AM. Please arrive 30 min prior to appointment time. Contact information: [place]. [address] [place] [address] [phone]              Contact information for after-discharge care     [facility] .   Service: Skilled Nursing Contact information: [address] [place] [phone]                    No Known Allergies  The results of significant diagnostics from this hospitalization (including imaging, microbiology, ancillary and laboratory) are listed below for reference.    Microbiology: Recent Results (from the past 240 hour(s))  C Difficile Quick Screen (NO PCR Reflex)     Status: None   Collection Time: 10/31/20  8:00 AM   Specimen: STOOL  Result Value Ref Range Status   C Diff antigen NEGATIVE NEGATIVE Final   C  Diff toxin NEGATIVE NEGATIVE Final   C Diff interpretation No C. difficile detected.  Final    Comment: Performed at [facility], [place] [address] [place], [place] [address]    Procedures/Studies: CT ABDOMEN  PELVIS WO CONTRAST  Result Date: 10/19/2020 CLINICAL DATA:  Abdominal pain for 2 weeks. Diarrhea for a few days. EXAM: CT ABDOMEN AND PELVIS WITHOUT CONTRAST TECHNIQUE: Multidetector CT imaging of the abdomen and pelvis was performed following the standard protocol without IV contrast. COMPARISON:  None. FINDINGS: Lower chest: Lung bases show bronchial wall thickening and minor subsegmental atelectasis. Heart borderline enlarged. Three-vessel coronary artery calcifications. Hepatobiliary: Liver diffusely low in attenuation. There is central volume loss with relative enlargement of the caudate lobe and lateral segment of the left lobe. No liver mass. Gallbladder is collapsed around multiple gallstones. No evidence of acute cholecystitis. No bile duct dilation. Pancreas: Unremarkable. No pancreatic ductal dilatation or surrounding inflammatory changes. Spleen: Normal in size without focal abnormality. Adrenals/Urinary Tract: 9 mm right adrenal angiomyolipoma. Adrenal glands otherwise unremarkable. Bilateral renal cortical thinning and bilateral low-attenuation renal masses consistent with cysts. No stones. No hydronephrosis. Normal ureters. Bladder decompressed with a Foley catheter Stomach/Bowel: There is pneumatosis intestinalis involving the cecum and ascending colon. The colon is dilated, most prominently the cecum and ascending colon, maximum diameter 10 cm.: There is a circumferential area of wall thickening and colonic narrowing of the mid sigmoid colon concerning for a constricting mass lesion. This measures approximately 4.7 cm in length. No other areas of colonic wall thickening. There is mild hazy inflammatory change adjacent to the distended cecum and ascending colon. There  are small bubbles of extraluminal air in the anterior mid to lower abdomen. Stomach is decompressed, otherwise unremarkable. Small bowel is unremarkable. Vascular/Lymphatic: Minor aortic atherosclerosis. No aneurysm. No enlarged lymph nodes. Reproductive: Unremarkable. Other: Trace amount of ascites tracking along the left pericolic gutter. Musculoskeletal: No fracture or acute finding.  No bone lesion. IMPRESSION: 1. 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 Colon is distended, most evident of the cecum and lower ascending colon, maximum 10 cm. 2. Apparent constricting mass lesion of the sigmoid colon, suspicious for carcinoma, which presumably is the cause of the colonic distension. Suspect the pneumatosis intestinalis is secondary. 3. Minimal amount of extraluminal air in the anterior mid to lower abdomen. 4. No other acute abnormality within the abdomen or pelvis. 5. Liver morphologic changes suggesting cirrhosis. No evidence of a liver mass. 6. Chronic findings include gallstones, mild aortic atherosclerosis, renal cortical thinning and renal cysts. Electronically Signed   By: Lajean Manes M.D.   On: 10/19/2020 23:25   DG Abd 1 View  Result Date: 10/26/2020 CLINICAL DATA:  Encounter for NG tube placement EXAM: ABDOMEN - 1 VIEW COMPARISON:  10/24/20 FINDINGS: Interval placement of nasogastric tube. The tip and side port project over the body of the stomach. Calcified gallstones are noted measuring up to 1.1 cm. IMPRESSION: Satisfactory placement of nasogastric tube with tip and side port in the expected location of the body of stomach. Electronically Signed   By: Kerby Moors M.D.   On: 10/26/2020 09:12   CT HEAD WO CONTRAST  Result Date: 10/26/2020 CLINICAL DATA:  Neuro deficit, acute, stroke suspected. EXAM: CT HEAD WITHOUT CONTRAST TECHNIQUE: Contiguous axial images were obtained from the base of the skull through the vertex without  intravenous contrast. COMPARISON:  Brain MRI 07/21/2016.  Head CT 07/18/2016. FINDINGS: Brain: Mild generalized cerebral and cerebellar atrophy. Redemonstrated chronic lacunar infarct within the left corona radiata/basal ganglia. Redemonstrated chronic lacunar infarct within the left thalamus. Background moderate patchy and ill-defined hypoattenuation, nonspecific but compatible with chronic small vessel ischemic disease. There is no acute intracranial hemorrhage.  No demarcated cortical infarct. No extra-axial fluid collection. No evidence of an intracranial mass. No midline shift. Vascular: No hyperdense vessel.  Atherosclerotic calcifications Skull: No calvarial fracture or focal suspicious osseous lesion. Sinuses/Orbits: Visualized orbits show no acute finding. Trace bilateral ethmoid and left sphenoid sinus mucosal thickening. IMPRESSION: No evidence of acute intracranial abnormality. Redemonstrated chronic lacunar infarcts within the left corona radiata/basal ganglia and left thalamus. Background moderate chronic small vessel ischemic changes within the cerebral white matter. Mild generalized parenchymal atrophy. Electronically Signed   By: [name] DO   On: 10/26/2020 14:20   CT CHEST WO CONTRAST  Result Date: 11/02/2020 CLINICAL DATA:  Sigmoid colon cancer, status post resection, chest staging EXAM: CT CHEST WITHOUT CONTRAST TECHNIQUE: Multidetector CT imaging of the chest was performed following the standard protocol without IV contrast. COMPARISON:  None. FINDINGS: Cardiovascular: Aortic atherosclerosis. Right upper extremity PICC, with tip position near the superior cavoatrial junction. Cardiomegaly. Three-vessel coronary artery calcifications and/or stents. No pericardial effusion. Mediastinum/Nodes: No enlarged mediastinal, hilar, or axillary lymph nodes. Thyroid gland, trachea, and esophagus demonstrate no significant findings. Lungs/Pleura: Small right pleural effusion with associated  atelectasis or consolidation. There is a 7 mm pulmonary nodule of the right upper lobe (series 5, image 49). Upper Abdomen: No acute abnormality.  Gallstones in the gallbladder. Musculoskeletal: No chest wall mass or suspicious bone lesions identified. IMPRESSION: 1. Small right pleural effusion with associated atelectasis or consolidation. 2. There is a 7 mm pulmonary nodule of the right upper lobe, nonspecific although moderately suspicious for solitary pulmonary metastasis. Attention on follow-up. 3. Coronary artery disease. 4. Cholelithiasis. Aortic Atherosclerosis (ICD10-I70.0). Electronically Signed   By: Eddie Candle M.D.   On: 11/02/2020 19:56   CT ABDOMEN PELVIS W CONTRAST  Result Date: 10/30/2020 CLINICAL DATA:  POD 6, S/P right colectomy with anastomosis, left colectomy with end colostomy, mobilization of splenic flexure 10/24/20 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 CONTRAST:  190m OMNIPAQUE IOHEXOL 350 MG/ML SOLN COMPARISON:  CT abdomen pelvis 10/19/2020 FINDINGS: Lower chest: Trace right pleural effusion. Right lower lobe airspace opacity. Central venous catheter terminating at the superior cavoatrial junction. Coronary artery calcifications involving at least 3 vessels. Hepatobiliary: The hepatic parenchyma is diffusely hypodense compared to the splenic parenchyma consistent with fatty infiltration. No focal liver abnormality. Multiple calcified gallstones within the gallbladder lumen. No gallbladder wall thickening or pericholecystic fluid. No biliary dilatation. Pancreas: No focal lesion. Normal pancreatic contour. No surrounding inflammatory changes. No main pancreatic  ductal dilatation. Spleen: Normal in size without focal abnormality. Adrenals/Urinary Tract: No adrenal nodule bilaterally. Bilateral kidneys enhance symmetrically. Several bilateral fluid density lesions within the kidneys likely represent simple renal cysts. Subcentimeter hypodensities are too small to characterize. No hydronephrosis. No hydroureter. The urinary bladder is unremarkable. Stomach/Bowel: Surgical changes related to a Hartmann pouch formation (2:74) and partial left colectomy, partial right colectomy status post ileocolonic anastomosis (2:59), left lower quadrant end colostomy formation (2:63). Partial opacification of the terminal ileum and transverse colon in the right abdomen. No PO contrast extravasation noted on the portal venous or delayed phase. Stomach is within normal limits. No evidence of small bowel wall thickening or dilatation. Proximal transverse colon bowel wall thickening and mild pericolonic fat stranding. Vascular/Lymphatic: No abdominal aorta  or iliac aneurysm. Mild atherosclerotic plaque of the aorta and its branches. No abdominal, pelvic, or inguinal lymphadenopathy. Reproductive: Prostate is unremarkable. Other: Trace to small volume free fluid within the right abdomen with a couple of tiny foci of associated free gas (2: 47-60). No organized fluid collection. Right surgical drain with tip terminating in the pelvis. Musculoskeletal: Healing anterior abdominal incision. No hernia formation. Mild subcutaneus soft tissue edema. No suspicious lytic or blastic osseous lesions. No acute displaced fracture. Multilevel degenerative changes of the spine. IMPRESSION: 1. Inflammatory changes of the proximal transverse colon just distal to the side-to-side ileocolonic anastomosis. 2. Trace to small volume free fluid within the right abdomen may be postsurgical versus inflammatory. Couple tiny foci of gas within the right abdomen are likely postsurgical in etiology. 3. No definite findings  of bowel leak or anastomotic suture dehiscence in a patient status post partial right colectomy with ileocolonic side-to-side anastomosis, partial left colectomy, and left lower quadrant end colostomy with Hartmann pouch formation. 4. Cholelithiasis with no CT findings to suggest acute cholecystitis. 5.  Aortic Atherosclerosis (ICD10-I70.0). Electronically Signed   By: Iven Finn M.D.   On: 10/30/2020 17:55   DG CHEST PORT 1 VIEW  Result Date: 10/29/2020 CLINICAL DATA:  Tiredness. EXAM: PORTABLE CHEST 1 VIEW COMPARISON:  Chest x-ray 10/24/2020. FINDINGS: Interim extubation and removal of NG tube. Right PICC line tip at cavoatrial junction. Heart size stable. No pulmonary venous congestion. Low lung volumes. Stable elevation right hemidiaphragm with mild right base subsegmental atelectasis. Tiny right pleural effusion. No pneumothorax. No pneumoperitoneum noted on today's exam. IMPRESSION: 1. Interim extubation and removal of NG tube. Right PICC line noted with tip at cavoatrial junction. 2. Low lung volumes. Stable elevation right hemidiaphragm with mild right base subsegmental atelectasis. Tiny right pleural effusion. 3.  No pneumoperitoneum noted on today's exam. Electronically Signed   By: [place]   On: 10/29/2020 13:50   DG CHEST PORT 1 VIEW  Result Date: 10/24/2020 CLINICAL DATA:  Hypertension. EXAM: PORTABLE CHEST 1 VIEW COMPARISON:  Same day. FINDINGS: The heart size and mediastinal contours are within normal limits. Endotracheal and nasogastric tubes appear to be in grossly good position. Right internal jugular catheter is noted with tip in expected position of the SVC. Left lung is clear. Elevated right hemidiaphragm is noted with minimal right basilar subsegmental atelectasis. Minimal right pneumoperitoneum is noted as described on prior radiograph. The visualized skeletal structures are unremarkable. IMPRESSION: Endotracheal and nasogastric tubes are in good position. Right internal  jugular catheter is in good position. Elevated right hemidiaphragm is noted with minimal right basilar subsegmental atelectasis. Minimal amount of pneumoperitoneum is noted under right hemidiaphragm is noted on prior exam. Electronically Signed   By: Marijo Conception M.D.   On: 10/24/2020 19:23   DG Chest Port 1 View  Addendum Date: 10/24/2020   ADDENDUM REPORT: 10/24/2020 10:05 ADDENDUM: Free air discussed by telephone with Dr. Eddie Dibbles STECHSCHULTE on 10/24/2020 at 1000 hours. Electronically Signed   By: Genevie Ann M.D.   On: 10/24/2020 10:05   Result Date: 10/24/2020 CLINICAL DATA:  75 year old male with distal colonic obstruction due to mass. EXAM: PORTABLE CHEST 1 VIEW COMPARISON:  Supine abdominal radiographs today. CT Abdomen and Pelvis 10/19/2020. FINDINGS: Portable AP upright view at 0922 hours. There is evidence of free air under the right hemidiaphragm over the liver dome, new compared to chest radiographs and CT 10/19/2020. Continued low lung volumes. Stable ventilation. Stable cardiac size and mediastinal contours. Ongoing  gas distended bowel in the left upper abdomen. IMPRESSION: Positive for pneumoperitoneum under the right hemidiaphragm. Electronically Signed: By: Genevie Ann M.D. On: 10/24/2020 09:56   DG Chest Port 1 View  Result Date: 10/19/2020 CLINICAL DATA:  Questionable sepsis - evaluate for abnormality EXAM: PORTABLE CHEST 1 VIEW COMPARISON:  03/20/2017 FINDINGS: The heart size and mediastinal contours are within normal limits. Both lungs are clear. The visualized skeletal structures are unremarkable. IMPRESSION: No active disease. Electronically Signed   By: Ulyses Jarred M.D.   On: 10/19/2020 20:58   DG Abd Portable 1V  Result Date: 10/24/2020 CLINICAL DATA:  75 year old male with distal colonic obstruction due to mass. EXAM: PORTABLE ABDOMEN - 1 VIEW COMPARISON:  CT Abdomen and Pelvis 10/19/2020. FINDINGS: Portable AP supine views at 0932 hours. Gas-filled bowel loops to the level of the  mid sigmoid colon, gas pattern appears mildly improved since 10/19/2020. No pneumoperitoneum is evident on this supine view. Stable elevated diaphragm, lung bases. No acute osseous abnormality identified. IMPRESSION: Suspect on going distal obstruction. Gas distended bowel in the abdomen and upper pelvis is mildly improved since the CT 10/19/2020. Electronically Signed   By: Genevie Ann M.D.   On: 10/24/2020 09:54   DG Abd Portable 1V  Result Date: 10/21/2020 CLINICAL DATA:  75 year old male with abdominal pain, colonic obstruction EXAM: PORTABLE ABDOMEN - 1 VIEW COMPARISON:  CT scan of the abdomen and pelvis 10/19/2020 FINDINGS: Persistent gaseous distension of the colon. The largest diameter is present in the transverse colon which measures up to 11.1 cm in diameter. Multiple stones project over the right upper quadrant consistent with cholelithiasis. No large free air on this single supine view. No acute osseous abnormality. IMPRESSION: Persistent gaseous distension of the colon, particularly the transverse colon which measures up to 11.1 cm in diameter. Cholelithiasis. Electronically Signed   By: Jacqulynn Cadet M.D.   On: 10/21/2020 10:12   ECHOCARDIOGRAM COMPLETE  Result Date: 10/21/2020    ECHOCARDIOGRAM REPORT   Patient Name:   MACKSON SENA Date of Exam: 10/21/2020 Medical Rec #:  [id number]     Height:       68.0 in Accession #:    [id number]    Weight:       269.0 lb Date of Birth:  21-Dec-1945     BSA:          2.318 m Patient Age:    39 years      BP:           135/53 mmHg Patient Gender: M             HR:           84 bpm. Exam Location:  Inpatient Procedure: 2D Echo, Cardiac Doppler, Color Doppler and Intracardiac            Opacification Agent Indications:     Atrial Fibrillation I48.91  History:         Patient has prior history of Echocardiogram examinations, most                  recent 03/23/2017. COPD; Risk Factors:Hypertension, Diabetes,                  Dyslipidemia and Non-Smoker.  CKD.  Sonographer:     Vickie Epley RDCS Referring Phys:  [place] Diagnosing Phys: Loralie Champagne MD IMPRESSIONS  1. Left ventricular ejection fraction, by estimation, is 45 to 50%. The left ventricle has  mildly decreased function. The left ventricle demonstrates global hypokinesis. There is mild left ventricular hypertrophy. Left ventricular diastolic parameters are consistent with Grade I diastolic dysfunction (impaired relaxation).  2. Right ventricular systolic function is normal. The right ventricular size is not well visualized. Tricuspid regurgitation signal is inadequate for assessing PA pressure.  3. The mitral valve is normal in structure. No evidence of mitral valve regurgitation. No evidence of mitral stenosis.  4. The aortic valve is tricuspid. Aortic valve regurgitation is not visualized. Mild aortic valve sclerosis is present, with no evidence of aortic valve stenosis.  5. The inferior vena cava is dilated in size with <50% respiratory variability, suggesting right atrial pressure of 15 mmHg.  6. Technically difficult study with poor acoustic windows. FINDINGS  Left Ventricle: Left ventricular ejection fraction, by estimation, is 45 to 50%. The left ventricle has mildly decreased function. The left ventricle demonstrates global hypokinesis. Definity contrast agent was given IV to delineate the left ventricular  endocardial borders. The left ventricular internal cavity size was normal in size. There is mild left ventricular hypertrophy. Left ventricular diastolic parameters are consistent with Grade I diastolic dysfunction (impaired relaxation). Right Ventricle: The right ventricular size is not well visualized. Right vetricular wall thickness was not well visualized. Right ventricular systolic function is normal. Tricuspid regurgitation signal is inadequate for assessing PA pressure. Left Atrium: Left atrial size was normal in size. Right Atrium: Right atrial size was normal in size.  Pericardium: There is no evidence of pericardial effusion. Mitral Valve: The mitral valve is normal in structure. There is mild calcification of the mitral valve leaflet(s). Mild mitral annular calcification. No evidence of mitral valve regurgitation. No evidence of mitral valve stenosis. Tricuspid Valve: The tricuspid valve is normal in structure. Tricuspid valve regurgitation is not demonstrated. Aortic Valve: The aortic valve is tricuspid. Aortic valve regurgitation is not visualized. Mild aortic valve sclerosis is present, with no evidence of aortic valve stenosis. Pulmonic Valve: The pulmonic valve was not well visualized. Pulmonic valve regurgitation is not visualized. Aorta: The aortic root is normal in size and structure. Venous: The inferior vena cava is dilated in size with less than 50% respiratory variability, suggesting right atrial pressure of 15 mmHg. IAS/Shunts: No atrial level shunt detected by color flow Doppler.  LEFT VENTRICLE PLAX 2D LVIDd:         5.20 cm      Diastology LVIDs:         3.80 cm      LV e' medial:    5.55 cm/s LV PW:         0.90 cm      LV E/e' medial:  13.3 LV IVS:        0.90 cm      LV e' lateral:   8.92 cm/s LVOT diam:     2.10 cm      LV E/e' lateral: 8.3 LV SV:         51 LV SV Index:   22 LVOT Area:     3.46 cm  LV Volumes (MOD) LV vol d, MOD A2C: 130.0 ml LV vol d, MOD A4C: 130.0 ml LV vol s, MOD A2C: 63.8 ml LV vol s, MOD A4C: 64.2 ml LV SV MOD A2C:     66.2 ml LV SV MOD A4C:     130.0 ml LV SV MOD BP:      72.6 ml RIGHT VENTRICLE RV S prime:     12.80 cm/s TAPSE (M-mode):  1.7 cm LEFT ATRIUM             Index       RIGHT ATRIUM           Index LA diam:        4.00 cm 1.73 cm/m  RA Area:     13.40 cm LA Vol (A2C):   39.7 ml 17.13 ml/m RA Volume:   31.60 ml  13.63 ml/m LA Vol (A4C):   33.3 ml 14.37 ml/m LA Biplane Vol: 36.9 ml 15.92 ml/m  AORTIC VALVE LVOT Vmax:   84.20 cm/s LVOT Vmean:  53.600 cm/s LVOT VTI:    0.146 m  AORTA Ao Root diam: 3.50 cm Ao Asc diam:   3.60 cm MITRAL VALVE MV Area (PHT): 3.81 cm    SHUNTS MV Decel Time: 199 msec    Systemic VTI:  0.15 m MV E velocity: 73.70 cm/s  Systemic Diam: 2.10 cm MV A velocity: 81.80 cm/s MV E/A ratio:  0.90 Loralie Champagne MD Electronically signed by Loralie Champagne MD Signature Date/Time: 10/21/2020/4:15:42 PM    Final (Updated)    [place] EKG SITE RITE  Result Date: 10/26/2020 If Site Rite image not attached, placement could not be confirmed due to current cardiac rhythm.   Labs: BNP (last 3 results) No results for input(s): BNP in the last 8760 hours. Basic Metabolic Panel: Recent Labs  Lab 11/02/20 0335 11/03/20 0505 11/04/20 0558 11/05/20 0230 11/06/20 0347  NA 137 135 135 134* 136  K 4.4 4.6 4.2 4.3 4.7  CL 108 104 101 99 100  CO2 '24 25 27 27 26  '$ GLUCOSE 127* 167* 77 116* 144*  BUN 24* 22 22 24* 27*  CREATININE 1.06 0.94 1.19 1.10 1.20  CALCIUM 7.8* 8.0* 8.2* 8.0* 8.1*  MG 1.8 1.8 1.7 2.0 1.8   Liver Function Tests: No results for input(s): AST, ALT, ALKPHOS, BILITOT, PROT, ALBUMIN in the last 168 hours. No results for input(s): LIPASE, AMYLASE in the last 168 hours. No results for input(s): AMMONIA in the last 168 hours. CBC: Recent Labs  Lab 11/01/20 0340 11/02/20 0335 11/03/20 0505 11/04/20 0558 11/05/20 0230 11/06/20 0347  WBC 16.5* 13.1* 10.9* 10.9* 10.2 8.2  NEUTROABS 11.3*  --  7.9* 8.1* 7.1 6.7  HGB 8.0* 7.9* 7.9* 8.2* 7.8* 8.3*  HCT 26.6* 26.3* 25.9* 27.0* 25.8* 27.1*  MCV 93.3 93.3 92.8 90.6 90.2 89.7  PLT 419* 446* 485* 454* 418* 408*   Cardiac Enzymes: No results for input(s): CKTOTAL, CKMB, CKMBINDEX, TROPONINI in the last 168 hours. BNP: Invalid input(s): POCBNP CBG: Recent Labs  Lab 11/05/20 1132 11/05/20 1558 11/05/20 2138 11/06/20 0714 11/06/20 1142  GLUCAP 232* 156* 164* 107* 127*   D-Dimer No results for input(s): DDIMER in the last 72 hours. Hgb A1c No results for input(s): HGBA1C in the last 72 hours. Lipid Profile No results for input(s):  CHOL, HDL, LDLCALC, TRIG, CHOLHDL, LDLDIRECT in the last 72 hours. Thyroid function studies No results for input(s): TSH, T4TOTAL, T3FREE, THYROIDAB in the last 72 hours.  Invalid input(s): FREET3 Anemia work up No results for input(s): VITAMINB12, FOLATE, FERRITIN, TIBC, IRON, RETICCTPCT in the last 72 hours. Urinalysis    Component Value Date/Time   COLORURINE YELLOW 10/29/2020 0846   APPEARANCEUR CLEAR 10/29/2020 0846   LABSPEC 1.015 10/29/2020 0846   PHURINE 6.0 10/29/2020 0846   GLUCOSEU >=500 (A) 10/29/2020 0846   HGBUR MODERATE (A) 10/29/2020 0846   BILIRUBINUR NEGATIVE 10/29/2020 0846   KETONESUR NEGATIVE  10/29/2020 0846   PROTEINUR 30 (A) 10/29/2020 0846   NITRITE NEGATIVE 10/29/2020 0846   LEUKOCYTESUR NEGATIVE 10/29/2020 0846   Sepsis Labs Invalid input(s): PROCALCITONIN,  WBC,  LACTICIDVEN Microbiology Recent Results (from the past 240 hour(s))  C Difficile Quick Screen (NO PCR Reflex)     Status: None   Collection Time: 10/31/20  8:00 AM   Specimen: STOOL  Result Value Ref Range Status   C Diff antigen NEGATIVE NEGATIVE Final   C Diff toxin NEGATIVE NEGATIVE Final   C Diff interpretation No C. difficile detected.  Final    Comment: Performed at [facility], [place] [address], [place], [place] [address]     Time coordinating discharge: 35 minutes  SIGNED: Antonieta Pert, MD  [facility] 11/06/2020, 1:56 PM  If 7PM-7AM, please contact night-coverage [email or web address]

## 2020-11-06 NOTE — TOC Progression Note (Signed)
Transition of Care [facility]) - Progression Note    Patient Details  Name: Mark Chang MRN: [id number] Date of Birth: [birth year]  [facility]) CM/SW Contact  Ross Ludwig, [place] Phone Number: 11/06/2020, 1:55 PM  Clinical Narrative:     CSW received phone call that patient has been approved for SNF placement.  CSW spoke to [facility], and they stated they were not able to find a next of kin, so they have agreed to sign patient's admission paperwork for SNF.  Per [facility] needs to contact either [facility] or [facility] at [phone].  CSW also provided information for patient's DSS worker to SNF.  CSW to facilitate discharge planning to [facility] [place] SNF.   Expected Discharge Plan: Skilled Nursing Facility Barriers to Discharge: SNF Pending bed offer  Expected Discharge Plan and Services Expected Discharge Plan: [place]   Discharge Planning Services: CM Consult Post Acute Care Choice: [place] Living arrangements for the past 2 months: [place] Expected Discharge Date: 11/06/20                                     Social Determinants of Health (SDOH) Interventions    Readmission Risk Interventions No flowsheet data found.

## 2020-11-06 NOTE — Progress Notes (Signed)
PICC removed: site unremarkable. Vaseline gauze and pressure dressing applied. Pt instructed to lie flat for 23mn. Dressing to remain dry and intact for 24 hours. RN updated.

## 2020-11-06 NOTE — TOC Transition Note (Addendum)
Transition of Care [facility]) - CM/SW Discharge Note   Patient Details  Name: Mark Chang MRN: [id number] Date of Birth: 04-May-1945  [facility] of Care [facility]) CM/SW Contact:  Ross Ludwig, LCSW Phone Number: 11/06/2020, 2:19 PM   Clinical Narrative:     CSW was informed that insurance has approved patient for SNF.  CSW requested a new covid test for patient, once test is back, patient can go to SNF today.  Patient to be d/c'ed today to [facility] room 404-1.  Patient and family agreeable to plans will transport via ems RN to call report to [phone].  CSW spoke to [facility] and they said they will sign patient in.  CSW updated attending physician.     Final next level of care: [place] Barriers to Discharge: Barriers Resolved   Patient Goals and CMS Choice Patient states their goals for this hospitalization and ongoing recovery are:: Patient plans to go to SNF for short term rehab, then return back to [facility]. CMS Medicare.gov Compare Post Acute Care list provided to:: Patient Represenative (must comment) Choice offered to / list presented to :  (ALF staff)  Discharge Placement PASRR number recieved: 10/30/20            Patient chooses bed at: [facility] Patient to be transferred to facility by: [facility] Name of family member notified: [facility] Patient and family notified of of transfer: 11/06/20  Discharge Plan and Services   Discharge Planning Services: CM Consult Post Acute Care Choice: [place]                               Social Determinants of Health (SDOH) Interventions     Readmission Risk Interventions No flowsheet data found.

## 2020-11-06 NOTE — Progress Notes (Signed)
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 Patient developed free air and required a right colectomy with anastomosis and left colectomy with end colostomy on 10/24/2020.    PT Comments    Patient making slow progress with mobility in acute setting. Plan today to attempt stepping/pre-gait activity however pt more fatigued and mildly diaphoretic however VSS (see below). Patient required Max +2 assist for bed mobility and Mod-Max +2 with use of RW/Stedy to rise from elevated bed. Pt fatigued quickly on stands an unable to maintain for full 5 seconds. Stedy transfer for bed>chair at EOS. Continue to recommend follow up skilled PT at SNF setting to address impairments. Acute PT will continue to progress pt as able.     Follow Up Recommendations  SNF     Equipment Recommendations  None recommended by PT    Recommendations for Other Services       Precautions / Restrictions Precautions Precautions: Fall Precaution Comments: abdominal incision, colostomy, JP drain Restrictions Weight Bearing Restrictions: No    Mobility  Bed Mobility Overal bed mobility: Needs Assistance Bed Mobility: Rolling;Sidelying to Sit Rolling: Max assist;+2 for physical assistance Sidelying to sit: Max assist;+2 for physical assistance;HOB elevated       General bed mobility comments: pt initiated walking Bil LE's off EOB but required Max+2 assist to fully move LE's off EOB and use of bed pad to roll to Lt sidelying. Max+2 to raise trunk from sidelying, pt using UE's some to press up and assist.    Transfers Overall transfer level: Needs assistance Equipment used: Rolling walker (2 wheeled) Transfers: Sit to/from [facility] Sit  to Stand: Max assist;+2 physical assistance;From elevated surface;+2 safety/equipment;Mod assist Stand pivot transfers: Total assist       General transfer comment: Mod-Max Assist+2 for sit<>stand from EOB with RW. therapist blocking feet to prevent sliding anteriorly with stand. Pt unable to maintain standing for 5 seconds due to fatigue today and sat back on bed after each stand almost immediately. Stedy used for assist to move bed>chair. VSS throughout with HR max of 115 bpm on tele.  Ambulation/Gait                 Stairs             Wheelchair Mobility    Modified Rankin (Stroke Patients Only)       Balance Overall balance assessment: Needs assistance Sitting-balance support: Feet supported Sitting balance-Leahy Scale: Poor Sitting balance - Comments: posterior lean in sitting needing cues to correct, progresses to supervision with B UE supporting in static sitting   Standing balance support: Bilateral upper extremity supported Standing balance-Leahy Scale: Poor Standing balance comment: Use of Stedy, reliant on external support                            Cognition Arousal/Alertness: Awake/alert Behavior During Therapy: WFL for tasks assessed/performed Overall Cognitive Status: History of cognitive impairments - at baseline                                 General Comments: orietned to self, flat affect, anwers questions appropriately with increased time for  processing. able to express needs.      Exercises      General Comments        Pertinent Vitals/Pain Pain Assessment: Faces Faces Pain Scale: Hurts a little bit Pain Location: LE's and abdomen with activity Pain Descriptors / Indicators: Discomfort;Grimacing;Tender Pain Intervention(s): Limited activity within patient's tolerance;Monitored during session;Repositioned    Home Living                      Prior Function            PT Goals (current goals  can now be found in the care plan section) Acute Rehab PT Goals Patient Stated Goal: Get up to chair PT Goal Formulation: With patient Time For Goal Achievement: 11/08/20 Potential to Achieve Goals: Fair Progress towards PT goals: Progressing toward goals    Frequency    Min 2X/week      PT Plan Current plan remains appropriate    Co-evaluation              AM-PAC PT "6 Clicks" Mobility   Outcome Measure  Help needed turning from your back to your side while in a flat bed without using bedrails?: A Lot Help needed moving from lying on your back to sitting on the side of a flat bed without using bedrails?: A Lot Help needed moving to and from a bed to a chair (including a wheelchair)?: Total Help needed standing up from a chair using your arms (e.g., wheelchair or bedside chair)?: Total Help needed to walk in hospital room?: Total Help needed climbing 3-5 steps with a railing? : Total 6 Click Score: 8    End of Session Equipment Utilized During Treatment: Gait belt Activity Tolerance: Patient limited by fatigue Patient left: in chair;with call bell/phone within reach;with chair alarm set Nurse Communication: Mobility status;Need for lift equipment PT Visit Diagnosis: Unsteadiness on feet (R26.81);Muscle weakness (generalized) (M62.81);Difficulty in walking, not elsewhere classified (R26.2)     Time: [id number] PT Time Calculation (min) (ACUTE ONLY): 27 min  Charges:  $Therapeutic Activity: 23-37 mins                     Mark Chang, DPT Acute Rehabilitation Services Office [phone] Pager [phone]    Mark Chang 11/06/2020, 2:37 PM

## 2020-11-06 NOTE — TOC Progression Note (Signed)
Transition of Care [facility]) - Progression Note    Patient Details  Name: Mark Chang MRN: [id number] Date of Birth: [birth year]  Transition of Care [facility]) CM/SW Contact  Joaquin Courts, RN Phone Number: 11/06/2020, 12:54 PM  Clinical Narrative:    Call placed to DSS case worker Carlyle Basques [phone] to check on status of locating patient's legal guardian.  VM left.   Expected Discharge Plan: Skilled Nursing Facility Barriers to Discharge: SNF Pending bed offer  Expected Discharge Plan and Services Expected Discharge Plan: [place]   Discharge Planning Services: CM Consult Post Acute Care Choice: [place] Living arrangements for the past 2 months: Assisted Living Facility                                       Social Determinants of Health (SDOH) Interventions    Readmission Risk Interventions No flowsheet data found.

## 2020-11-06 NOTE — Progress Notes (Signed)
13 Days Post-Op   Subjective/Chief Complaint: No complaints   Objective: Vital signs in last 24 hours: Temp:  [98.3 F (36.8 C)-99.4 F (37.4 C)] 98.7 F (37.1 C) (08/10 0707) Pulse Rate:  [91-128] 91 (08/10 0707) Resp:  [18-22] 18 (08/10 0707) BP: (76-139)/(44-62) 95/50 (08/10 0707) SpO2:  [93 %-98 %] 97 % (08/10 0707) Last BM Date: 11/04/20  Intake/Output from previous day: 08/09 0701 - 08/10 0700 In: 840 [P.O.:840] Out: 1825 [Urine:1300; Stool:525] Intake/Output this shift: No intake/output data recorded.  Exam: Awake and alert Abdomen soft, non-tender, incision healing well Ostomy working  Lab Results:  Recent Labs    11/05/20 0230 11/06/20 0347  WBC 10.2 8.2  HGB 7.8* 8.3*  HCT 25.8* 27.1*  PLT 418* 408*   BMET Recent Labs    11/05/20 0230 11/06/20 0347  NA 134* 136  K 4.3 4.7  CL 99 100  CO2 27 26  GLUCOSE 116* 144*  BUN 24* 27*  CREATININE 1.10 1.20  CALCIUM 8.0* 8.1*   PT/INR No results for input(s): LABPROT, INR in the last 72 hours. ABG No results for input(s): PHART, HCO3 in the last 72 hours.  Invalid input(s): PCO2, PO2  Studies/Results: No results found.  Anti-infectives: Anti-infectives (From admission, onward)    Start     Dose/Rate Route Frequency Ordered Stop   10/27/20 1400  ceFEPIme (MAXIPIME) 2 g in sodium chloride 0.9 % 100 mL IVPB        2 g 200 mL/hr over 30 Minutes Intravenous Every 8 hours 10/27/20 0927 10/27/20 2250   10/25/20 1400  ceFEPIme (MAXIPIME) 2 g in sodium chloride 0.9 % 100 mL IVPB  Status:  Discontinued        2 g 200 mL/hr over 30 Minutes Intravenous Every 12 hours 10/25/20 0729 10/27/20 0927   10/23/20 1000  ceFEPIme (MAXIPIME) 2 g in sodium chloride 0.9 % 100 mL IVPB  Status:  Discontinued        2 g 200 mL/hr over 30 Minutes Intravenous Every 8 hours 10/23/20 0833 10/25/20 0729   10/21/20 1000  ceFEPIme (MAXIPIME) 2 g in sodium chloride 0.9 % 100 mL IVPB  Status:  Discontinued        2 g 200  mL/hr over 30 Minutes Intravenous Every 12 hours 10/21/20 0742 10/23/20 0833   10/21/20 0600  metroNIDAZOLE (FLAGYL) IVPB 500 mg        500 mg 100 mL/hr over 60 Minutes Intravenous Every 8 hours 10/20/20 0028 10/28/20 0759   10/20/20 2200  ceFEPIme (MAXIPIME) 2 g in sodium chloride 0.9 % 100 mL IVPB  Status:  Discontinued        2 g 200 mL/hr over 30 Minutes Intravenous Every 24 hours 10/20/20 0048 10/21/20 0742   10/19/20 2030  ceFEPIme (MAXIPIME) 2 g in sodium chloride 0.9 % 100 mL IVPB        2 g 200 mL/hr over 30 Minutes Intravenous  Once 10/19/20 2029 10/20/20 0033   10/19/20 2030  metroNIDAZOLE (FLAGYL) IVPB 500 mg        500 mg 100 mL/hr over 60 Minutes Intravenous  Once 10/19/20 2029 10/19/20 2210       Assessment/Plan: s/p Procedure(s): EXPLORATORY LAPAROTOMY, RIGHT COLECTOMY WITH ANASTOMOSIS. LEFT COLECTOMY WITH COLOSTOMY, MOBILIZATION OF SPLENIC FLEXURE (N/A)    LOS: 17 days   Awaiting SNF placement No surgical issues at this point almost 2 weeks out from surgery. Drain and staples are out We will sign off but are  available as needed  Coralie Keens 11/06/2020

## 2020-11-08 ENCOUNTER — Encounter: Payer: Self-pay | Admitting: General Practice

## 2020-11-08 ENCOUNTER — Telehealth: Payer: Self-pay | Admitting: *Deleted

## 2020-11-08 NOTE — Telephone Encounter (Signed)
Have attempted to reach brother, POA at both phone #'s yesterday and today without success-no voice mail is available. Have left #2 messages for CSW at SNF. Reached out to [facility] CSW to see what next steps would be to process referral and care for patient.

## 2020-11-08 NOTE — Progress Notes (Signed)
[facility] CSW Progress Notes  Secure chat from [facility] [place], Therapist, sports.  Concerned that patient does not have a family member or legal guardian.  Has been unable to reach brother listed on facesheet.  Pateint was recently discharged from inpatient and sent [facility] for [facility] term rehab.  Contacted their admissions representative - she confirmed that patient was placed there and has no family member or legal guardian.  He is a long term resident of [facility] ([facility] [phone]).  SNF is under impression that he will be returning to [facility] upon discharge from SNF.  Spoke w [facility] at [facility].  They are NOT his POA or guardian - he signed himself into the facility approx 12 years ago and has thus far provided his own consents for all services.  ALF is is representative payee w Social Security and they receive his check/provide for all his needs.  ALF has not been able to reach brother in quite some time, phone number is now inoperable.  Per CSW E Anterhaus, inpatient team referred patient to [place] case worker Carlyle Basques [phone]  - called Ms Noberto Retort - per her VM she is a Insurance account manager.  Left VM asking for update as per inpatient team, they have asked DSS to help locate family of patient.  Per ALF/[facility], patient does have a cognitive impairment at this time and appears unlikely to be able to complete HCPOA paperwork in their opinion.  Dionne Milo RN updated.   Edwyna Shell, LCSW Clinical Social Worker Phone:  [phone]

## 2020-11-11 ENCOUNTER — Telehealth: Payer: Self-pay | Admitting: *Deleted

## 2020-11-11 NOTE — Telephone Encounter (Signed)
CSW returned call and reports that facility will transport to appointments in [place]. They have not been able to find his POA, but prior facility [facility] has made referral to have APS obtain a legal guardian for him. She adds that if chemo is billed under hospital NPI, then there is no problem with him having chemo while a resident there.  For transportation, call facility and ask for [employer]. Chart to MD for review as to when to get him in.

## 2020-11-11 NOTE — Telephone Encounter (Signed)
Left VM for CSW at SNF requesting return call to discuss transportation to new patient visit in [place] and if residents there are allowed to be receiving chemotherapy and if they have been able to reach his POA? Attempted again to reach POA at both phone #'s without success.

## 2020-11-12 ENCOUNTER — Other Ambulatory Visit: Payer: Self-pay | Admitting: *Deleted

## 2020-11-12 ENCOUNTER — Encounter: Payer: Self-pay | Admitting: General Practice

## 2020-11-12 DIAGNOSIS — C189 Malignant neoplasm of colon, unspecified: Secondary | ICD-10-CM

## 2020-11-12 NOTE — Progress Notes (Signed)
[place] CSW Progress Notes  Spoke w [facility].  Inpatient CSW did make APS report based on patient being in need of guardian.  APS is now aware that patient was discharged to [facility].  They are in process of conducting their investigation and trying to identify any other family members who  might be able to support patient.  Sheffie [phone]) is the APS worker in investigating this case.  She was given  [facility] CSW contact information as well as [facility] contact information so she can reach out as needed for additional information on patient needs.    Edwyna Shell, LCSW Clinical Social Worker Phone:  [phone]

## 2020-11-13 ENCOUNTER — Telehealth: Payer: Self-pay | Admitting: *Deleted

## 2020-11-13 NOTE — Telephone Encounter (Signed)
Left VM with transportation coordinator at [facility] to confirm appointment here on 8/18 at 1:40 pm> Please call office if not able to come.

## 2020-11-14 ENCOUNTER — Telehealth: Payer: Self-pay | Admitting: *Deleted

## 2020-11-14 ENCOUNTER — Encounter: Payer: Self-pay | Admitting: General Practice

## 2020-11-14 ENCOUNTER — Other Ambulatory Visit: Payer: Self-pay

## 2020-11-14 ENCOUNTER — Inpatient Hospital Stay: Payer: Medicare HMO | Attending: Oncology | Admitting: Oncology

## 2020-11-14 VITALS — BP 177/66 | HR 110 | Temp 98.2°F | Resp 18 | Ht 67.0 in

## 2020-11-14 DIAGNOSIS — C187 Malignant neoplasm of sigmoid colon: Secondary | ICD-10-CM | POA: Diagnosis not present

## 2020-11-14 DIAGNOSIS — F79 Unspecified intellectual disabilities: Secondary | ICD-10-CM | POA: Diagnosis not present

## 2020-11-14 DIAGNOSIS — E1129 Type 2 diabetes mellitus with other diabetic kidney complication: Secondary | ICD-10-CM | POA: Diagnosis not present

## 2020-11-14 DIAGNOSIS — I10 Essential (primary) hypertension: Secondary | ICD-10-CM

## 2020-11-14 DIAGNOSIS — C189 Malignant neoplasm of colon, unspecified: Secondary | ICD-10-CM

## 2020-11-14 NOTE — Progress Notes (Signed)
[facility] [facility] CSW Progress Notes  Call from [place], [place] worker.  She has located brother, Ronne Binning, and gotten his updated phone number [phone]).  She has updated him on patient's recent surgery, current placement at SNF and potential need for cancer treatment.  Brother would like to be at today's appointment and is able/willing to speak on patient's behalf as needed.  Message sent to oncology treatment team.  Edwyna Shell, LCSW Clinical Social Worker Phone:  [phone]

## 2020-11-14 NOTE — Progress Notes (Signed)
[facility] New Patient Consult   Requesting MD: Antonieta Pert, Md [place],  [place] [address]   Mark Chang 75 y.o.  [birth year]    Reason for Consult: Colon cancer   HPI: Mark Chang is currently residing in a rehabilitation facility.  He is here today with his brother.  The history is obtained from the medical record and his brother as Mark Chang has retardation.  Mark Chang presented to the emergency room on 10/19/2020 with abdominal pain.  He had hypotension.  A CT of the abdomen pelvis on 10/19/2020 revealed multiple gallstones, no evidence of acute cholecystitis.  Pneumatosis intestinalis involving the cecum and ascending colon.  The colon is dilated.  Circumferential wall thickening was noted at the mid sigmoid colon concerning for a constricting mass.  Small amount of extraluminal air in the anterior mid to lower abdomen.  No enlarged lymph nodes.  Trace ascites at the left paracolic gutter.  Liver morphology suggesting cirrhosis.  He developed abdominal free air on plain x-ray and was taken to the operating room on 10/24/2020 for a right colectomy with anastomosis, left colectomy with end colostomy, mobilization of splenic flexure.  He was noted to have a cecal perforation secondary to distention and an obstructing sigmoid colon mass.  The pathology from the left colon resection revealed a moderately differentiated adenocarcinoma involving the pericolonic soft tissue.  3 of 12 lymph nodes contained metastatic carcinoma.  Lymphovascular invasion is present.  There was 1 tumor deposit.  The mismatch repair protein expression is intact.  A microscopic focus of carcinoid tumor was noted in the appendix. A staging chest CT on 11/02/2020 revealed a 7 mm right upper lobe nodule and a small right pleural effusion.  He was discharged to a skilled nursing facility on 11/06/2020.   Past Medical History:  Diagnosis Date   CKD (chronic kidney disease), stage III ([place])  03/24/2017   COPD (chronic obstructive pulmonary disease) (HCC)    DM (diabetes mellitus), type 2 with renal complications ([place]) 19/14/7829   Hyperlipidemia    Hypertension    Mental retardation    Obesity, Class III, BMI 40-49.9 (morbid obesity) ([place]) 03/24/2017   Pernicious anemia     Past Surgical History:  Procedure Laterality Date   COLON RESECTION SIGMOID N/A 10/24/2020   Procedure: EXPLORATORY LAPAROTOMY, RIGHT COLECTOMY WITH ANASTOMOSIS. LEFT COLECTOMY WITH COLOSTOMY, MOBILIZATION OF SPLENIC FLEXURE;  Surgeon: Stechschulte, Nickola Major, MD;  Location: [facility] ORS;  Service: General;  Laterality: N/A;    Medications: Reviewed  Allergies: No Known Allergies  Family history: No family history of cancer  Social History:   He currently resides in a rehabilitation facility and chronically lives in a nursing facility secondary to mental retardation.  No tobacco or alcohol use.  No risk factor for HIV or hepatitis.  No transfusion history.  He has received COVID-19 vaccines.  ROS:   Positives include: Altered balance for several months limiting ambulation  A complete ROS was otherwise negative.  Physical Exam:  Blood pressure (!) 177/66, pulse (!) 110, temperature 98.2 F (36.8 C), temperature source Oral, resp. rate 18, height $RemoveBe'5\' 7"'jPSieMxax$  (1.702 m), SpO2 100 %.  HEENT: Neck without mass Lungs: Clear bilaterally Cardiac: Distant heart sounds, regular rhythm Abdomen: No hepatosplenomegaly, left abdomen colostomy with brown stool, 2 cm superficial opening at the inferior aspect of the midline wound with a packing in place  Vascular: No leg edema Lymph nodes: No cervical, supraclavicular, axillary, or inguinal nodes Neurologic:  Alert, follows commands, moves all extremities Skin: No rash Musculoskeletal: No spine tenderness   LAB:  CBC  Lab Results  Component Value Date   WBC 8.2 11/06/2020   HGB 8.3 (L) 11/06/2020   HCT 27.1 (L) 11/06/2020   MCV 89.7 11/06/2020   PLT 408 (H)  11/06/2020   NEUTROABS 6.7 11/06/2020        CMP  Lab Results  Component Value Date   NA 136 11/06/2020   K 4.7 11/06/2020   CL 100 11/06/2020   CO2 26 11/06/2020   GLUCOSE 144 (H) 11/06/2020   BUN 27 (H) 11/06/2020   CREATININE 1.20 11/06/2020   CALCIUM 8.1 (L) 11/06/2020   PROT 5.3 (L) 10/28/2020   ALBUMIN 1.7 (L) 10/28/2020   AST 20 10/28/2020   ALT 18 10/28/2020   ALKPHOS 53 10/28/2020   BILITOT 0.4 10/28/2020   GFRNONAA >60 11/06/2020   GFRAA >60 03/24/2017     Lab Results  Component Value Date   CEA1 6.3 (H) 10/20/2020    Imaging: CT images reviewed with Mark Chang and his brother   Assessment/Plan:   Sigmoid colon cancer, stage IIIb (T3N1b), status post a left colectomy with end colostomy 10/16/2020 Tumor involves paraglottic soft tissue, 3/12 lymph nodes positive, 1 tumor deposit, lymphovascular invasion present, no loss of mismatch repair protein expression CT abdomen/pelvis 10/19/2020-pneumatosis intestinalis involving the cecum and ascending colon, constricting mass in the sigmoid colon, liver morphology suggestive of cirrhosis, gallstones CT chest 11/03/2018 -7 mm right upper lobe nodule, small right pleural effusion  2.   Cecum perforation/septic shock secondary to an obstructing sigmoid colon cancer, status post a right colectomy on 10/24/2020 3.   Diabetes 4.   Mental retardation 5.   Hypertension   Disposition:   Mark Chang has been diagnosed with advanced stage colon cancer.  He has clinical stage III disease based on the pathology from the sigmoid resection, but he could have stage IV disease based on the right lung nodule.  I discussed the prognosis and treatment options with Mark Chang and his brother.  Mark Chang has limited understanding of his diagnosis.  We discussed observation/supportive care versus a trial of "adjuvant "capecitabine.  I reviewed potential toxicities associated with capecitabine including the chance of mouth sores, diarrhea,  hyperpigmentation, sun sensitivity, and hand/foot syndrome.  He is brother feels Mark Chang could tolerate capecitabine and would like to proceed with adjuvant therapy.  We we will plan for a repeat chest CT in 2-3 months to follow-up on the lung nodule.  We discussed obtaining a PET scan, but decided against this for now.  The abdominal wound remains open.  He will return for an office and lab visit in approximately 2 weeks.  We will decide on adjuvant therapy after this visit.  His brother understands treatment will involve frequent office visits and coordination of care between the cancer center and the skilled nursing facility.  He plans to return to his long-term facility in [place] after further recovery from surgery.  Betsy Coder, MD  11/14/2020, 4:21 PM

## 2020-11-14 NOTE — Telephone Encounter (Signed)
Called facility to inquire if they have an anticipated d/c date for him. Was told by nurse that the DON has no d/c date in mind for now--they have sent information in to [employer] to extend his time there. CSW Gerrit Friends) is on vacation. Left VM at ext #[phone] asking if family will need to bring him chemo pills for them to administer, or will they order them from their pharmacy if we send in script to them.

## 2020-11-15 NOTE — Progress Notes (Signed)
   11/05/20 1346  Assess: MEWS Score  Temp 98.7 F (37.1 C)  BP (!) 76/62  Pulse Rate (!) 105  Resp (!) 22  Level of Consciousness Alert  SpO2 96 %  O2 Device Room Air  Assess: MEWS Score  MEWS Temp 0  MEWS Systolic 2  MEWS Pulse 1  MEWS RR 1  MEWS LOC 0  MEWS Score 4  MEWS Score Color Red  Assess: if the MEWS score is Yellow or Red  Were vital signs taken at a resting state? Yes  Focused Assessment Change from prior assessment (see assessment flowsheet)  Does the patient meet 2 or more of the SIRS criteria? Yes  Does the patient have a confirmed or suspected source of infection? No  MEWS guidelines implemented *See Row Information* Yes  Treat  MEWS Interventions Other (Comment) (RECHECK VITALS)  Pain Scale 0-10  Pain Score 0  Take Vital Signs  Increase Vital Sign Frequency  Red: Q 1hr X 4 then Q 4hr X 4, if remains red, continue Q 4hrs  Escalate  MEWS: Escalate Red: discuss with charge nurse/RN and provider, consider discussing with RRT  Notify: Charge Nurse/RN  Name of Charge Nurse/RN Notified Lindi Adie. RN  Date Charge Nurse/RN Notified 11/05/20  Time Charge Nurse/RN Notified 1350  Notify: Provider  Provider Name/Title Guirgis MD  Date Provider Notified 11/05/20  Time Provider Notified 1350  Notification Type Page  Notification Reason Change in status;Other (Comment) (red MEWS)  Provider response No new orders (continue to monitor)  Date of Provider Response 11/05/20  Time of Provider Response 1400  Document  Patient Outcome Other (Comment) (stable remains in the dept.closely monitored)  Progress note created (see row info) Yes  Assess: SIRS CRITERIA  SIRS Temperature  0  SIRS Pulse 1  SIRS Respirations  1  SIRS WBC 0  SIRS Score Sum  2   Pt. Was assessed, no complaints, not in distress. CN and MD was notified. Ordered for close monitoring. Red MEWS activated. Pt. On close monitoring.

## 2020-11-19 ENCOUNTER — Telehealth: Payer: Self-pay | Admitting: *Deleted

## 2020-11-19 NOTE — Telephone Encounter (Signed)
Confirmed w/nurse at facility that for oral chemotherapy administration, the family member would need to pick up the medication and bring to facility each month for them to administer. MD office will need to send script to them for administration directions.

## 2020-12-05 ENCOUNTER — Inpatient Hospital Stay: Payer: Medicare HMO

## 2020-12-05 ENCOUNTER — Inpatient Hospital Stay: Payer: Medicare HMO | Admitting: Oncology

## 2020-12-08 ENCOUNTER — Emergency Department (HOSPITAL_COMMUNITY)
Admission: EM | Admit: 2020-12-08 | Discharge: 2020-12-08 | Disposition: A | Discharge status: Home / Self Care | Payer: Medicare HMO | Attending: Student | Admitting: Student

## 2020-12-08 ENCOUNTER — Encounter (HOSPITAL_COMMUNITY): Payer: Self-pay | Admitting: Emergency Medicine

## 2020-12-08 ENCOUNTER — Other Ambulatory Visit: Payer: Self-pay

## 2020-12-08 DIAGNOSIS — E1129 Type 2 diabetes mellitus with other diabetic kidney complication: Secondary | ICD-10-CM | POA: Insufficient documentation

## 2020-12-08 DIAGNOSIS — E1122 Type 2 diabetes mellitus with diabetic chronic kidney disease: Secondary | ICD-10-CM | POA: Diagnosis not present

## 2020-12-08 DIAGNOSIS — I129 Hypertensive chronic kidney disease with stage 1 through stage 4 chronic kidney disease, or unspecified chronic kidney disease: Secondary | ICD-10-CM | POA: Diagnosis not present

## 2020-12-08 DIAGNOSIS — Z79899 Other long term (current) drug therapy: Secondary | ICD-10-CM | POA: Insufficient documentation

## 2020-12-08 DIAGNOSIS — K9403 Colostomy malfunction: Secondary | ICD-10-CM | POA: Insufficient documentation

## 2020-12-08 DIAGNOSIS — N183 Chronic kidney disease, stage 3 unspecified: Secondary | ICD-10-CM | POA: Diagnosis not present

## 2020-12-08 DIAGNOSIS — Z794 Long term (current) use of insulin: Secondary | ICD-10-CM | POA: Diagnosis not present

## 2020-12-08 DIAGNOSIS — Z7189 Other specified counseling: Secondary | ICD-10-CM

## 2020-12-08 DIAGNOSIS — J449 Chronic obstructive pulmonary disease, unspecified: Secondary | ICD-10-CM | POA: Diagnosis not present

## 2020-12-08 NOTE — ED Notes (Signed)
Attempted report to [facility] facility. No answer.

## 2020-12-08 NOTE — ED Notes (Signed)
[facility] contacted to transport patient back to facility. [facility].

## 2020-12-08 NOTE — ED Triage Notes (Signed)
Pt bib EMS from assisted living facility. Ostomy bag is leaking. Per EMS facility will not empty or change bag and stated social work consult is needed.

## 2020-12-08 NOTE — ED Provider Notes (Signed)
[facility] DEPT Provider Note   CSN: [id number] Arrival date & time: 12/08/20  1136     History No chief complaint on file.   Mark Chang is a 75 y.o. male with PMH CKD 3, COPD, T2DM, HLD, HTN, MRDD, sigmoid colon resection status post ostomy placement secondary to cecal mass who presents to the emergency department for leaking ostomy bag.  I called the facility and spoke with the facility who stated that they feel ill equipped to change in ostomy bag and that home health saw the patient today but refused to change his ostomy bag and recommending to EMS to bring the patient to the hospital.  On arrival, the patient is denying chest pain, shortness of breath, abdominal pain, nausea, vomiting, increased ostomy output, fevers or any other systemic symptoms.  HPI     Past Medical History:  Diagnosis Date   CKD (chronic kidney disease), stage III ([place]) 03/24/2017   COPD (chronic obstructive pulmonary disease) (HCC)    DM (diabetes mellitus), type 2 with renal complications ([place]) 123XX123   Hyperlipidemia    Hypertension    Mental retardation    Obesity, Class III, BMI 40-49.9 (morbid obesity) ([place]) 03/24/2017   Pernicious anemia     Patient Active Problem List   Diagnosis Date Noted   Cancer of sigmoid colon ([place]) 11/14/2020   Diarrhea 10/31/2020   Mass of cecum 10/20/2020   Pneumatosis coli 10/20/2020   Sepsis due to Escherichia coli (E. coli) ([place]) 03/24/2017   E. coli UTI 03/24/2017   Obesity, Class III, BMI 40-49.9 (morbid obesity) ([place]) 03/24/2017   DM (diabetes mellitus), type 2 with renal complications ([place]) 123XX123   CKD (chronic kidney disease), stage III ([place]) 03/24/2017   Intellectual disability 03/20/2017   Hypertension 03/20/2017   Pernicious anemia 03/20/2017    Past Surgical History:  Procedure Laterality Date   COLON RESECTION SIGMOID N/A 10/24/2020   Procedure: EXPLORATORY LAPAROTOMY, RIGHT COLECTOMY WITH ANASTOMOSIS.  LEFT COLECTOMY WITH COLOSTOMY, MOBILIZATION OF SPLENIC FLEXURE;  Surgeon: Stechschulte, Nickola Major, MD;  Location: [facility];  Service: General;  Laterality: N/A;       Family History  Family history unknown: Yes    Social History   Tobacco Use   Smoking status: Never   Smokeless tobacco: Never  Vaping Use   Vaping Use: Never used  Substance Use Topics   Alcohol use: No    Home Medications Prior to Admission medications   Medication Sig Start Date End Date Taking? Authorizing Provider  acetaminophen (TYLENOL) 500 MG tablet Take 500 mg by mouth 3 (three) times daily.    [provider]  atorvastatin (LIPITOR) 40 MG tablet Take 40 mg by mouth at bedtime. 07/03/19   [provider]  dextrose (GLUTOSE) 40 % GEL Take 1 Tube by mouth once as needed for low blood sugar.    [provider]  docusate sodium (COLACE) 100 MG capsule Take 100 mg by mouth 2 (two) times daily.    [provider]  escitalopram (LEXAPRO) 10 MG tablet Take 10 mg by mouth daily.    [provider]  famotidine (PEPCID) 20 MG tablet Take 20 mg by mouth 2 (two) times daily. 06/12/19   [provider]  feeding supplement (ENSURE ENLIVE / ENSURE PLUS) LIQD Take 237 mLs by mouth 3 (three) times daily between meals. 11/06/20   Antonieta Pert, MD  gabapentin (NEURONTIN) 100 MG capsule Take 100 mg by mouth 2 (two) times daily.  07/03/19   [provider]  insulin aspart (NOVOLOG FLEXPEN) 100 UNIT/ML FlexPen Inject 5 Units into the skin 3 (three) times daily with meals. 11/06/20   Antonieta Pert, MD  insulin aspart (NOVOLOG) 100 UNIT/ML injection CBG 70 - 120: 0 units CBG 121 - 150: 2 units CBG 151 - 200: 3 units CBG 201 - 250: 5 units CBG 251 - 300: 8 units CBG 301 - 350: 11 units CBG 351 - 400: 15 units 07/23/16   Hosie Poisson, MD  Insulin Glargine (BASAGLAR KWIKPEN) 100 UNIT/ML Inject 40 Units into the skin at bedtime. 11/06/20   Antonieta Pert, MD  lisinopril (ZESTRIL) 20 MG tablet  Take 1 tablet (20 mg total) by mouth daily. 11/07/20   Antonieta Pert, MD  loperamide (IMODIUM) 2 MG capsule Take 1 capsule (2 mg total) by mouth 3 (three) times daily. 11/06/20   Antonieta Pert, MD  Melatonin 5 MG SUBL Take 10 mg by mouth at bedtime.    [provider]  Multiple Vitamin (MULTIVITAMIN WITH MINERALS) TABS tablet Take 1 tablet by mouth daily. 11/07/20   Antonieta Pert, MD  Nutritional Supplements (,FEEDING SUPPLEMENT, PROSOURCE PLUS) liquid Take 30 mLs by mouth 2 (two) times daily between meals. 11/06/20   Antonieta Pert, MD  polycarbophil (FIBERCON) 625 MG tablet Take 1 tablet (625 mg total) by mouth 2 (two) times daily. 11/06/20   Antonieta Pert, MD  polyethylene glycol powder (GLYCOLAX/MIRALAX) 17 GM/SCOOP powder Take 17 g by mouth 2 (two) times daily as needed for mild constipation or moderate constipation. Mix with 8 oz of liquid    [provider]  TRULICITY 1.5 0000000 SOPN Inject 1.5 mg into the skin every Thursday. 06/13/19   [provider]  vitamin B-12 (CYANOCOBALAMIN) 1000 MCG tablet Take 1,000 mcg by mouth daily.    [provider]    Allergies    Patient has no known allergies.  Review of Systems   Review of Systems  Constitutional:  Negative for chills and fever.  HENT:  Negative for ear pain and sore throat.   Eyes:  Negative for pain and visual disturbance.  Respiratory:  Negative for cough and shortness of breath.   Cardiovascular:  Negative for chest pain and palpitations.  Gastrointestinal:  Negative for abdominal pain and vomiting.  Genitourinary:  Negative for dysuria and hematuria.  Musculoskeletal:  Negative for arthralgias and back pain.  Skin:  Negative for color change and rash.  Neurological:  Negative for seizures and syncope.  All other systems reviewed and are negative.  Physical Exam Updated Vital Signs BP 120/67   Pulse 83   Temp 98 F (36.7 C) (Oral)   Resp 18   Wt 100.2 kg   SpO2 100%   BMI 34.61 kg/m   Physical  Exam Vitals and nursing note reviewed.  Constitutional:      Appearance: He is well-developed.  HENT:     Head: Normocephalic and atraumatic.  Eyes:     Conjunctiva/sclera: Conjunctivae normal.  Cardiovascular:     Rate and Rhythm: Normal rate and regular rhythm.     Heart sounds: No murmur heard. Pulmonary:     Effort: Pulmonary effort is normal. No respiratory distress.     Breath sounds: Normal breath sounds.  Abdominal:     Palpations: Abdomen is soft.     Tenderness: There is no abdominal tenderness.     Comments: Ostomy bag in place with liquid stool seeping around a nonadhesive side  Musculoskeletal:  Cervical back: Neck supple.  Skin:    General: Skin is warm and dry.  Neurological:     Mental Status: He is alert.    ED Results / Procedures / Treatments   Labs (all labs ordered are listed, but only abnormal results are displayed) Labs Reviewed - No data to display  EKG None  Radiology No results found.  Procedures Procedures   Medications Ordered in ED Medications - No data to display  ED Course  I have reviewed the triage vital signs and the nursing notes.  Pertinent labs & imaging results that were available during my care of the patient were reviewed by me and considered in my medical decision making (see chart for details).    MDM Rules/Calculators/A&P                           Patient seen emergency department for evaluation of a leaking ostomy bag.  Physical exam is unremarkable outside of a malfunctioning ostomy adhesive.  Ostomy bag changed and patient cleaned appropriately.  I had a discussion with the facility who states that they will speak with their supervisor to discuss appropriate ostomy care in the facility and if the facility cannot meet these needs they will coordinate transfer to another facility from their facility.  Ostomy bag changed and patient discharged. Final Clinical Impression(s) / ED Diagnoses Final diagnoses:  None     Rx / DC Orders ED Discharge Orders     None        Cheyla Duchemin, MD 12/08/20 1407

## 2020-12-13 ENCOUNTER — Inpatient Hospital Stay: Payer: Medicare HMO | Attending: Oncology

## 2020-12-13 ENCOUNTER — Inpatient Hospital Stay: Payer: Medicare HMO | Admitting: Oncology

## 2020-12-24 ENCOUNTER — Ambulatory Visit (HOSPITAL_BASED_OUTPATIENT_CLINIC_OR_DEPARTMENT_OTHER): Payer: Medicare HMO | Admitting: Family

## 2020-12-24 NOTE — Progress Notes (Deleted)
Office Visit    Patient Name: Mark Chang Date of Encounter: 12/24/2020  PCP:  Terrill Mohr, NP   [place]  Cardiologist:  Candee Furbish, MD  Advanced Practice Provider:  No care team member to display Electrophysiologist:  None     Chief Complaint    Mark Chang is a 75 y.o. male with a hx of hypertension, DM2, CKD 3 a, intellectual disability, atrial fibrillation presents today for follow-up of atrial fibrillation  Past Medical History    Past Medical History:  Diagnosis Date   CKD (chronic kidney disease), stage III ([place]) 03/24/2017   COPD (chronic obstructive pulmonary disease) ([place])    DM (diabetes mellitus), type 2 with renal complications ([place]) 01/74/9449   Hyperlipidemia    Hypertension    Mental retardation    Obesity, Class III, BMI 40-49.9 (morbid obesity) ([place]) 03/24/2017   Pernicious anemia    Past Surgical History:  Procedure Laterality Date   COLON RESECTION SIGMOID N/A 10/24/2020   Procedure: EXPLORATORY LAPAROTOMY, RIGHT COLECTOMY WITH ANASTOMOSIS. LEFT COLECTOMY WITH COLOSTOMY, MOBILIZATION OF SPLENIC FLEXURE;  Surgeon: Felicie Morn, MD;  Location: [facility];  Service: General;  Laterality: N/A;    Allergies  No Known Allergies  History of Present Illness    Mark Chang is a 75 y.o. male with a hx of hypertension, DM2, CKD 3 a, intellectual disability, atrial fibrillation last seen while hospitalized.  He presented to the emergency room 10/19/2020 with abdominal pain from [facility] with abdominal pain and marked hypotension 68/54.  CT abdomen/pelvis with cecal mass probably sigmoid colon with concern for perforated viscus.  GI and general surgery were consulted.  He was initially monitored with conservative management as they could not get a hold of family or legal guardian.  He ultimately underwent right colectomy with anastomosis, left colectomy with end colostomy.  Appendix which was removed was noted to  have well differentiated neuroendocrine tumor measuring less than 1 mm.  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  CT chest with 7 mm pulmonary nodule in the right upper lobe.  Urology was consulted due to atrial fibrillation with RVR.  He was discharged to SNF.  He was seen 11/14/2020 by Dr. Benay Spice due to advanced stage colon cancer.  Clinical stage III disease based on pathology from sigmoid resection but could have stage IV disease based on right lung nodule.  He presents today for follow-up. ***  EKGs/Labs/Other Studies Reviewed:   The following studies were reviewed today:  Echo 10/21/2020  1. Left ventricular ejection fraction, by estimation, is 45 to 50%. The  left ventricle has mildly decreased function. The left ventricle  demonstrates global hypokinesis. There is mild left ventricular  hypertrophy. Left ventricular diastolic parameters  are consistent with Grade I diastolic dysfunction (impaired relaxation).   2. Right ventricular systolic function is normal. The right ventricular  size is not well visualized. Tricuspid regurgitation signal is inadequate  for assessing PA pressure.   3. The mitral valve is normal in structure. No evidence of mitral valve  regurgitation. No evidence of mitral stenosis.   4. The aortic valve is tricuspid. Aortic valve regurgitation is not  visualized. Mild aortic valve sclerosis is present, with no evidence of  aortic valve stenosis.   5. The inferior vena cava is dilated in size with <50% respiratory  variability, suggesting right atrial  pressure of 15 mmHg.   6. Technically difficult study with poor acoustic windows.  Echocardiogram 02/2017:    Study Conclusions   - Left ventricle: The cavity size was moderately dilated. There was    mild concentric hypertrophy. Systolic function was mildly    reduced. The  estimated ejection fraction was in the range of 45%    to 50%. Wall motion was normal; there were no regional wall    motion abnormalities. Features are consistent with a pseudonormal    left ventricular filling pattern, with concomitant abnormal    relaxation and increased filling pressure (grade 2 diastolic    dysfunction). Doppler parameters are consistent with high    ventricular filling pressure.  - Left atrium: The atrium was mildly dilated.     EKG:  EKG is  ordered today.  The ekg ordered today demonstrates ***  Recent Labs: 10/28/2020: ALT 18 11/06/2020: BUN 27; Creatinine, Ser 1.20; Hemoglobin 8.3; Magnesium 1.8; Platelets 408; Potassium 4.7; Sodium 136  Recent Lipid Panel    Component Value Date/Time   CHOL 144 03/23/2017 0816   TRIG 198 (H) 10/28/2020 0456   HDL 18 (L) 03/23/2017 0816   CHOLHDL 8.0 03/23/2017 0816   VLDL 66 (H) 03/23/2017 0816   LDLCALC 60 03/23/2017 0816    Home Medications   No outpatient medications have been marked as taking for the 12/24/20 encounter (Appointment) with Loel Dubonnet, NP.     Review of Systems   All other systems reviewed and are otherwise negative except as noted above.  Physical Exam    VS:  There were no vitals taken for this visit. , BMI There is no height or weight on file to calculate BMI.  Wt Readings from Last 3 Encounters:  12/08/20 221 lb (100.2 kg)  11/02/20 240 lb 15.4 oz (109.3 kg)  03/22/17 269 lb 10 oz (122.3 kg)     GEN: Well nourished, well developed, in no acute distress. HEENT: normal. Neck: Supple, no JVD, carotid bruits, or masses. Cardiac: ***RRR, no murmurs, rubs, or gallops. No clubbing, cyanosis, edema.  ***Radials/PT 2+ and equal bilaterally.  Respiratory:  ***Respirations regular and unlabored, clear to auscultation bilaterally. GI: Soft, nontender, nondistended. MS: No deformity or atrophy. Skin: Warm and dry, no rash. Neuro:  Strength and sensation are intact. Psych: Normal  affect.  Assessment & Plan    Atrial fibrillation -   AKI -   Cardiomyopathy - Echo 2018 LVEF 45-50%. Echo 09/2020 with stable LVEF 45-50%. ***  Disposition: Follow up {follow up:15908} with *** or APP.  Signed, Loel Dubonnet, NP 12/24/2020, 12:45 PM [facility]

## 2020-12-30 ENCOUNTER — Inpatient Hospital Stay: Payer: Medicare HMO | Attending: Oncology

## 2020-12-30 ENCOUNTER — Other Ambulatory Visit: Payer: Self-pay

## 2020-12-30 ENCOUNTER — Inpatient Hospital Stay (HOSPITAL_BASED_OUTPATIENT_CLINIC_OR_DEPARTMENT_OTHER): Payer: Medicare HMO | Admitting: Oncology

## 2020-12-30 VITALS — BP 156/81 | HR 85 | Temp 97.8°F | Resp 18 | Ht 67.0 in | Wt 217.0 lb

## 2020-12-30 DIAGNOSIS — I10 Essential (primary) hypertension: Secondary | ICD-10-CM | POA: Insufficient documentation

## 2020-12-30 DIAGNOSIS — R6521 Severe sepsis with septic shock: Secondary | ICD-10-CM | POA: Insufficient documentation

## 2020-12-30 DIAGNOSIS — E119 Type 2 diabetes mellitus without complications: Secondary | ICD-10-CM | POA: Diagnosis not present

## 2020-12-30 DIAGNOSIS — C187 Malignant neoplasm of sigmoid colon: Secondary | ICD-10-CM | POA: Insufficient documentation

## 2020-12-30 DIAGNOSIS — Z9049 Acquired absence of other specified parts of digestive tract: Secondary | ICD-10-CM | POA: Insufficient documentation

## 2020-12-30 DIAGNOSIS — C189 Malignant neoplasm of colon, unspecified: Secondary | ICD-10-CM

## 2020-12-30 DIAGNOSIS — F79 Unspecified intellectual disabilities: Secondary | ICD-10-CM | POA: Insufficient documentation

## 2020-12-30 DIAGNOSIS — Z933 Colostomy status: Secondary | ICD-10-CM | POA: Insufficient documentation

## 2020-12-30 LAB — CMP (CANCER CENTER ONLY)
ALT: 10 U/L (ref 0–44)
AST: 15 U/L (ref 15–41)
Albumin: 3.9 g/dL (ref 3.5–5.0)
Alkaline Phosphatase: 63 U/L (ref 38–126)
Anion gap: 9 (ref 5–15)
BUN: 12 mg/dL (ref 8–23)
CO2: 24 mmol/L (ref 22–32)
Calcium: 9.7 mg/dL (ref 8.9–10.3)
Chloride: 107 mmol/L (ref 98–111)
Creatinine: 0.73 mg/dL (ref 0.61–1.24)
GFR, Estimated: >60 mL/min (ref >60)
Glucose, Bld: 78 mg/dL (ref 70–99)
Potassium: 4.9 mmol/L (ref 3.5–5.1)
Sodium: 140 mmol/L (ref 135–145)
Total Bilirubin: 0.4 mg/dL (ref 0.3–1.2)
Total Protein: 7.3 g/dL (ref 6.5–8.1)

## 2020-12-30 LAB — CBC WITH DIFFERENTIAL (CANCER CENTER ONLY)
Abs Immature Granulocytes: 0.05 10*3/uL (ref 0.00–0.07)
Basophils Absolute: 0.1 10*3/uL (ref 0.0–0.1)
Basophils Relative: 1 %
Eosinophils Absolute: 0.1 10*3/uL (ref 0.0–0.5)
Eosinophils Relative: 2 %
HCT: 35.7 % — ABNORMAL LOW (ref 39.0–52.0)
Hemoglobin: 10.8 g/dL — ABNORMAL LOW (ref 13.0–17.0)
Immature Granulocytes: 1 %
Lymphocytes Relative: 28 %
Lymphs Abs: 1.9 10*3/uL (ref 0.7–4.0)
MCH: 25.9 pg — ABNORMAL LOW (ref 26.0–34.0)
MCHC: 30.3 g/dL (ref 30.0–36.0)
MCV: 85.6 fL (ref 80.0–100.0)
Monocytes Absolute: 0.5 10*3/uL (ref 0.1–1.0)
Monocytes Relative: 7 %
Neutro Abs: 4.2 10*3/uL (ref 1.7–7.7)
Neutrophils Relative %: 61 %
Platelet Count: 258 10*3/uL (ref 150–400)
RBC: 4.17 MIL/uL — ABNORMAL LOW (ref 4.22–5.81)
RDW: 16.5 % — ABNORMAL HIGH (ref 11.5–15.5)
WBC Count: 6.9 10*3/uL (ref 4.0–10.5)
nRBC: 0 % (ref 0.0–0.2)

## 2020-12-30 LAB — CEA (ACCESS): CEA (CHCC): 1.82 ng/mL (ref 0.00–5.00)

## 2020-12-30 NOTE — Progress Notes (Signed)
   [place] OFFICE PROGRESS NOTE   Diagnosis: Colon cancer  INTERVAL HISTORY:   Mark Chang is now in an assisted living facility.  He is here today with a caretaker from the facility.  He missed scheduled follow-up here last month.  The caretaker reports he has difficulty managing the colostomy bag.  He has no complaint.  She reports his family has not been visiting.  Objective:  Vital signs in last 24 hours:  Blood pressure (!) 156/81, pulse 85, temperature 97.8 F (36.6 C), temperature source Oral, resp. rate 18, height $RemoveBe'5\' 7"'zIdMKDxeS$  (1.702 m), weight 217 lb (98.4 kg), SpO2 100 %.   Lymphatics: No cervical, supraclavicular, axillary, or inguinal nodes Resp: Lungs clear bilaterally Cardio: Regular rate and rhythm GI: No hepatomegaly, left lower quadrant colostomy with brown stool Vascular: No leg edema Neuro: Alert, follows commands   Lab Results:  Lab Results  Component Value Date   WBC 6.9 12/30/2020   HGB 10.8 (L) 12/30/2020   HCT 35.7 (L) 12/30/2020   MCV 85.6 12/30/2020   PLT 258 12/30/2020   NEUTROABS 4.2 12/30/2020    CMP  Lab Results  Component Value Date   NA 136 11/06/2020   K 4.7 11/06/2020   CL 100 11/06/2020   CO2 26 11/06/2020   GLUCOSE 144 (H) 11/06/2020   BUN 27 (H) 11/06/2020   CREATININE 1.20 11/06/2020   CALCIUM 8.1 (L) 11/06/2020   PROT 5.3 (L) 10/28/2020   ALBUMIN 1.7 (L) 10/28/2020   AST 20 10/28/2020   ALT 18 10/28/2020   ALKPHOS 53 10/28/2020   BILITOT 0.4 10/28/2020   GFRNONAA >60 11/06/2020   GFRAA >60 03/24/2017    Lab Results  Component Value Date   CEA1 6.3 (H) 10/20/2020     Medications: I have reviewed the patient's current medications.   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   Cecum perforation/septic shock secondary to an obstructing sigmoid colon cancer, status post a right colectomy on 10/24/2020 3.   Diabetes 4.   Mental retardation 5.   Hypertension    Disposition: Mark Chang was diagnosed with stage III colon cancer when he underwent a left colectomy on 10/16/2020.  He is here today with a caretaker from his assisted living facility.  Mark Chang has limited understanding of his diagnosis.  I discussed adjuvant therapy with him today.  I do not recommend adjuvant chemotherapy based on his current living situation and limited insight.  He is now greater than 2 months out from diagnosis.  He may have metastatic disease.  His caretaker reports it would be difficult for the assisted living facility staff to report on side effects from capecitabine.  I recommend observation.  He will return for an office visit in 3 months.  He has been taking a stool softener and Imodium.  I recommend the assisted facility practitioner adjust the medical regimen.  Betsy Coder, MD  12/30/2020  8:46 AM

## 2021-02-02 ENCOUNTER — Encounter (HOSPITAL_COMMUNITY): Payer: Self-pay | Admitting: Emergency Medicine

## 2021-02-02 ENCOUNTER — Emergency Department (HOSPITAL_COMMUNITY)
Admission: EM | Admit: 2021-02-02 | Discharge: 2021-02-02 | Disposition: A | Discharge status: Home / Self Care | Payer: Medicare HMO | Attending: Emergency Medicine | Admitting: Emergency Medicine

## 2021-02-02 ENCOUNTER — Other Ambulatory Visit: Payer: Self-pay

## 2021-02-02 DIAGNOSIS — Z794 Long term (current) use of insulin: Secondary | ICD-10-CM | POA: Diagnosis not present

## 2021-02-02 DIAGNOSIS — E1122 Type 2 diabetes mellitus with diabetic chronic kidney disease: Secondary | ICD-10-CM | POA: Diagnosis not present

## 2021-02-02 DIAGNOSIS — I129 Hypertensive chronic kidney disease with stage 1 through stage 4 chronic kidney disease, or unspecified chronic kidney disease: Secondary | ICD-10-CM | POA: Insufficient documentation

## 2021-02-02 DIAGNOSIS — Z433 Encounter for attention to colostomy: Secondary | ICD-10-CM | POA: Insufficient documentation

## 2021-02-02 DIAGNOSIS — N183 Chronic kidney disease, stage 3 unspecified: Secondary | ICD-10-CM | POA: Insufficient documentation

## 2021-02-02 DIAGNOSIS — Z85038 Personal history of other malignant neoplasm of large intestine: Secondary | ICD-10-CM | POA: Diagnosis not present

## 2021-02-02 DIAGNOSIS — Z79899 Other long term (current) drug therapy: Secondary | ICD-10-CM | POA: Diagnosis not present

## 2021-02-02 DIAGNOSIS — J449 Chronic obstructive pulmonary disease, unspecified: Secondary | ICD-10-CM | POA: Insufficient documentation

## 2021-02-02 NOTE — ED Notes (Signed)
Attempted to call patient's brother at number listed without answer.

## 2021-02-02 NOTE — ED Notes (Addendum)
Area around stoma and abdomen cleaned of fecal debris. Colostomy bag replaced

## 2021-02-02 NOTE — ED Notes (Signed)
Attempted to call facility for pt return, no answer x 2

## 2021-02-02 NOTE — Discharge Instructions (Signed)
Mark Chang was seen in the ER today for his colostomy bag complication. His bag was replaced. He may follow with his PCP.

## 2021-02-02 NOTE — ED Notes (Signed)
Provided pt with a drink while he awaits [facility].

## 2021-02-02 NOTE — ED Notes (Signed)
[facility] called for pt transfer back to facility

## 2021-02-02 NOTE — ED Notes (Addendum)
Pt abdomen denies abdominal pain. Fecal matter witnessed all over pts abdomen and groin. Stoma pink, non erythematous. No pain upon palpation of area around stoma.

## 2021-02-02 NOTE — ED Provider Notes (Signed)
[facility] DEPT Provider Note   CSN: [id number] Arrival date & time: 02/02/21  1052     History Chief Complaint  Patient presents with   colostomy bag replacement    Facility ran out of colostomy bags, pt was sent to ED    Mark Chang is a 75 y.o. male who presents with concern for colostomy bag replacement.  Was at his facility, SNF, when he has a colostomy bag fell off and the facility did not place available and transported him to the emergency department by EMS.  Patient denies any other symptoms.  According to his note he has been otherwise well.  I personally reviewed patient's medical records.  He is history of intellectual disability, type 2 diabetes, CKD stage III, hypertension, and colon cancer.  HPI     Past Medical History:  Diagnosis Date   CKD (chronic kidney disease), stage III ([place]) 03/24/2017   COPD (chronic obstructive pulmonary disease) ([facility])    DM (diabetes mellitus), type 2 with renal complications ([place]) 33/82/5053   Hyperlipidemia    Hypertension    Mental retardation    Obesity, Class III, BMI 40-49.9 (morbid obesity) ([place]) 03/24/2017   Pernicious anemia     Patient Active Problem List   Diagnosis Date Noted   Cancer of sigmoid colon ([place]) 11/14/2020   Diarrhea 10/31/2020   Mass of cecum 10/20/2020   Pneumatosis coli 10/20/2020   Sepsis due to Escherichia coli (E. coli) ([place]) 03/24/2017   E. coli UTI 03/24/2017   Obesity, Class III, BMI 40-49.9 (morbid obesity) ([place]) 03/24/2017   DM (diabetes mellitus), type 2 with renal complications ([place]) 97/67/3419   CKD (chronic kidney disease), stage III ([place]) 03/24/2017   Intellectual disability 03/20/2017   Hypertension 03/20/2017   Pernicious anemia 03/20/2017    Past Surgical History:  Procedure Laterality Date   COLON RESECTION SIGMOID N/A 10/24/2020   Procedure: EXPLORATORY LAPAROTOMY, RIGHT COLECTOMY WITH ANASTOMOSIS. LEFT COLECTOMY WITH COLOSTOMY, MOBILIZATION OF  SPLENIC FLEXURE;  Surgeon: Stechschulte, Nickola Major, MD;  Location: [facility];  Service: General;  Laterality: N/A;       Family History  Family history unknown: Yes    Social History   Tobacco Use   Smoking status: Never   Smokeless tobacco: Never  Vaping Use   Vaping Use: Never used  Substance Use Topics   Alcohol use: No    Home Medications Prior to Admission medications   Medication Sig Start Date End Date Taking? Authorizing Provider  acetaminophen (TYLENOL) 500 MG tablet Take 500 mg by mouth 3 (three) times daily.    [provider]  atorvastatin (LIPITOR) 40 MG tablet Take 40 mg by mouth at bedtime. 07/03/19   [provider]  dextrose (GLUTOSE) 40 % GEL Take 1 Tube by mouth once as needed for low blood sugar.    [provider]  docusate sodium (COLACE) 100 MG capsule Take 100 mg by mouth 2 (two) times daily.    [provider]  escitalopram (LEXAPRO) 10 MG tablet Take 10 mg by mouth daily.    [provider]  famotidine (PEPCID) 20 MG tablet Take 20 mg by mouth 2 (two) times daily. 06/12/19   [provider]  feeding supplement (ENSURE ENLIVE / ENSURE PLUS) LIQD Take 237 mLs by mouth 3 (three) times daily between meals. 11/06/20   Antonieta Pert, MD  gabapentin (NEURONTIN) 100 MG capsule Take 100 mg by mouth 2 (two) times daily.  07/03/19   [provider]  insulin aspart (NOVOLOG FLEXPEN) 100 UNIT/ML FlexPen Inject 5 Units into the skin 3 (three) times daily with meals. 11/06/20   Antonieta Pert, MD  insulin aspart (NOVOLOG) 100 UNIT/ML injection CBG 70 - 120: 0 units CBG 121 - 150: 2 units CBG 151 - 200: 3 units CBG 201 - 250: 5 units CBG 251 - 300: 8 units CBG 301 - 350: 11 units CBG 351 - 400: 15 units 07/23/16   Hosie Poisson, MD  Insulin Glargine (BASAGLAR KWIKPEN) 100 UNIT/ML Inject 40 Units into the skin at bedtime. 11/06/20   Antonieta Pert, MD  lisinopril (ZESTRIL) 20 MG tablet Take 1 tablet (20 mg total) by mouth daily.  11/07/20   Antonieta Pert, MD  loperamide (IMODIUM) 2 MG capsule Take 1 capsule (2 mg total) by mouth 3 (three) times daily. 11/06/20   Antonieta Pert, MD  Melatonin 5 MG SUBL Take 10 mg by mouth at bedtime.    [provider]  Multiple Vitamin (MULTIVITAMIN WITH MINERALS) TABS tablet Take 1 tablet by mouth daily. 11/07/20   Antonieta Pert, MD  Nutritional Supplements (,FEEDING SUPPLEMENT, PROSOURCE PLUS) liquid Take 30 mLs by mouth 2 (two) times daily between meals. 11/06/20   Antonieta Pert, MD  polycarbophil (FIBERCON) 625 MG tablet Take 1 tablet (625 mg total) by mouth 2 (two) times daily. 11/06/20   Antonieta Pert, MD  polyethylene glycol powder (GLYCOLAX/MIRALAX) 17 GM/SCOOP powder Take 17 g by mouth 2 (two) times daily as needed for mild constipation or moderate constipation. Mix with 8 oz of liquid    [provider]  TRULICITY 1.5 GN/5.6OZ SOPN Inject 1.5 mg into the skin every Thursday. 06/13/19   [provider]  vitamin B-12 (CYANOCOBALAMIN) 1000 MCG tablet Take 1,000 mcg by mouth daily.    [provider]    Allergies    Patient has no known allergies.  Review of Systems   Review of Systems  Constitutional: Negative.   HENT: Negative.    Respiratory: Negative.    Cardiovascular: Negative.   Gastrointestinal: Negative.   Genitourinary: Negative.   Musculoskeletal: Negative.   Neurological: Negative.    Physical Exam Updated Vital Signs BP (!) 150/67 (BP Location: Right Arm)   Pulse 77   Temp 98 F (36.7 C) (Oral)   Resp 18   SpO2 100%   Physical Exam Vitals and nursing note reviewed.  Constitutional:      Appearance: He is not ill-appearing or toxic-appearing.  HENT:     Head: Normocephalic and atraumatic.     Nose: Nose normal. No congestion.     Mouth/Throat:     Mouth: Mucous membranes are moist.     Pharynx: No oropharyngeal exudate or posterior oropharyngeal erythema.  Eyes:     General:        Right eye: No discharge.        Left eye: No  discharge.     Extraocular Movements: Extraocular movements intact.     Conjunctiva/sclera: Conjunctivae normal.     Pupils: Pupils are equal, round, and reactive to light.  Cardiovascular:     Rate and Rhythm: Normal rate and regular rhythm.     Pulses: Normal pulses.     Heart sounds: Normal heart sounds. No murmur heard. Pulmonary:     Effort: Pulmonary effort is normal. No respiratory distress.     Breath sounds: Normal breath sounds. No wheezing or rales.  Abdominal:     General: Bowel sounds are normal. There is  no distension.     Palpations: Abdomen is soft.     Tenderness: There is no abdominal tenderness.    Musculoskeletal:        General: No deformity.     Cervical back: Neck supple.  Skin:    General: Skin is warm and dry.  Neurological:     Mental Status: He is alert. Mental status is at baseline.  Psychiatric:        Mood and Affect: Mood normal.    ED Results / Procedures / Treatments   Labs (all labs ordered are listed, but only abnormal results are displayed) Labs Reviewed - No data to display  EKG None  Radiology No results found.  Procedures Procedures   Medications Ordered in ED Medications - No data to display  ED Course  I have reviewed the triage vital signs and the nursing notes.  Pertinent labs & imaging results that were available during my care of the patient were reviewed by me and considered in my medical decision making (see chart for details).    MDM Rules/Calculators/A&P                         75 year old male who presents without colostomy bag, needs replacement.   Hypertensive on intake, but is otherwise normal.  Cardiopulmonary exam is normal, abdominal exam with colostomy exposed without bag in place but no surrounding signs of infection on the skin adjacent to the colostomy.  Colostomy bag replaced by RN, no further work-up warranted in the ER at this time.  Mark Chang voiced understanding with medical evaluation and treatment  plan.  Each of his questions was answered to his expressed satisfaction.  Return precautions were given.  Patient is well-appearing, stable, and appropriate for discharge at this time.  This chart was dictated using voice recognition software, Dragon. Despite the best efforts of this provider to proofread and correct errors, errors may still occur which can change documentation meaning.  Final Clinical Impression(s) / ED Diagnoses Final diagnoses:  Colostomy care [facility])    Rx / [place] Orders ED Discharge Orders     None        Aura Dials 02/02/21 1321    Lacretia Leigh, MD 02/04/21 1550

## 2021-02-02 NOTE — ED Triage Notes (Signed)
75 yo male BIB[facility] from [facility] due to needing replacement colostomy bag as the facility states they ran out. No other complaints at this time.

## 2021-02-02 NOTE — ED Triage Notes (Signed)
Per EMS, patient from SNF, reported colostomy bag fell off and there were none at the facility to reapply. Covered at this time with moist sterile dressing. Disoriented to baseline.

## 2021-04-01 ENCOUNTER — Encounter: Payer: Self-pay | Admitting: Nurse Practitioner

## 2021-04-01 ENCOUNTER — Other Ambulatory Visit: Payer: Self-pay

## 2021-04-01 ENCOUNTER — Inpatient Hospital Stay: Payer: Medicare HMO | Attending: Oncology | Admitting: Nurse Practitioner

## 2021-04-01 VITALS — BP 154/81 | HR 100 | Temp 98.1°F | Resp 18 | Ht 67.0 in | Wt 215.0 lb

## 2021-04-01 DIAGNOSIS — F79 Unspecified intellectual disabilities: Secondary | ICD-10-CM | POA: Diagnosis not present

## 2021-04-01 DIAGNOSIS — Z933 Colostomy status: Secondary | ICD-10-CM | POA: Diagnosis not present

## 2021-04-01 DIAGNOSIS — E119 Type 2 diabetes mellitus without complications: Secondary | ICD-10-CM | POA: Insufficient documentation

## 2021-04-01 DIAGNOSIS — I10 Essential (primary) hypertension: Secondary | ICD-10-CM | POA: Diagnosis not present

## 2021-04-01 DIAGNOSIS — Z85038 Personal history of other malignant neoplasm of large intestine: Secondary | ICD-10-CM | POA: Insufficient documentation

## 2021-04-01 DIAGNOSIS — C187 Malignant neoplasm of sigmoid colon: Secondary | ICD-10-CM | POA: Diagnosis not present

## 2021-04-01 NOTE — Progress Notes (Addendum)
[place] OFFICE PROGRESS NOTE   Diagnosis: Colon cancer  INTERVAL HISTORY:   Mark Chang returns as scheduled.  He is accompanied to today's visit by his brother.  Colostomy is functioning normally.  He notes some skin irritation associated with the colostomy bag.  He reports a good appetite.  Objective:  Vital signs in last 24 hours:  Blood pressure (!) 154/81, pulse 100, temperature 98.1 F (36.7 C), temperature source Oral, resp. rate 18, height $RemoveBe'5\' 7"'dEcFKxvTD$  (1.702 m), weight 215 lb (97.5 kg), SpO2 100 %.    Lymphatics: No palpable cervical, supraclavicular, axillary or inguinal lymph nodes. Resp: Lungs clear bilaterally. Cardio: Regular rate and rhythm. GI: Abdomen soft and nontender.  No hepatomegaly.  Left lower quadrant colostomy. Vascular: Trace bilateral lower leg edema. Neuro: Alert, follows commands.   Lab Results:  Lab Results  Component Value Date   WBC 6.9 12/30/2020   HGB 10.8 (L) 12/30/2020   HCT 35.7 (L) 12/30/2020   MCV 85.6 12/30/2020   PLT 258 12/30/2020   NEUTROABS 4.2 12/30/2020    Imaging:  No results found.  Medications: I have reviewed the patient's current medications.  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   Cecum perforation/septic shock secondary to an obstructing sigmoid colon cancer, status post a right colectomy on 10/24/2020 3.   Diabetes 4.   Mental retardation 5.   Hypertension  Disposition: Mark Chang appears  unchanged.  He is accompanied to today's visit by his brother.  There is no clinical evidence of disease progression.  Plan to continue to follow with observation.  Mark Chang brother is in agreement with this plan.  He will return for follow-up in 3 months.  We are available to see him sooner if needed.  Patient seen with Dr. Benay Spice.    Ned Card ANP/GNP-BC   04/01/2021  10:39 AM

## 2021-07-01 ENCOUNTER — Inpatient Hospital Stay: Payer: Medicare HMO | Attending: Oncology | Admitting: Oncology

## 2021-07-01 VITALS — BP 160/80 | HR 91 | Temp 97.8°F | Resp 18 | Ht 67.0 in | Wt 197.8 lb

## 2021-07-01 DIAGNOSIS — L723 Sebaceous cyst: Secondary | ICD-10-CM | POA: Diagnosis not present

## 2021-07-01 DIAGNOSIS — C187 Malignant neoplasm of sigmoid colon: Secondary | ICD-10-CM

## 2021-07-01 DIAGNOSIS — Z933 Colostomy status: Secondary | ICD-10-CM | POA: Diagnosis not present

## 2021-07-01 DIAGNOSIS — Z85038 Personal history of other malignant neoplasm of large intestine: Secondary | ICD-10-CM | POA: Insufficient documentation

## 2021-07-01 NOTE — Progress Notes (Signed)
?  [place] ?OFFICE PROGRESS NOTE ? ? ?Diagnosis: Colon cancer ? ?INTERVAL HISTORY:  ? ?Mark Chang returns as scheduled.  He is here with his brother.  He reports occasional diarrhea.  His appetite is variable.  He eats every day.  He has noted a "knot "at the left abdomen. ? ?Objective: ? ?Vital signs in last 24 hours: ? ?Blood pressure (!) 160/80, pulse 91, temperature 97.8 ?F (36.6 ?C), resp. rate 18, height $RemoveBe'5\' 7"'tkkZsvzdV$  (1.702 m), weight 197 lb 12.8 oz (89.7 kg), SpO2 100 %. ?  ? ?HEENT: Neck without mass ?Lymphatics: No cervical, supraclavicular, axillary, or inguinal nodes ?Resp: Lungs clear bilaterally ?Cardio: Regular rate and rhythm ?GI: No hepatosplenomegaly, soft and nontender, left lower quadrant colostomy ?Vascular: No leg edema  ?Skin: 2 cm round cutaneous lesion at the left mid lateral abdominal wall, there is a central clearing.  I was able to express a thick white material from the lesion ? ?Lab Results: ? ?Lab Results  ?Component Value Date  ? WBC 6.9 12/30/2020  ? HGB 10.8 (L) 12/30/2020  ? HCT 35.7 (L) 12/30/2020  ? MCV 85.6 12/30/2020  ? PLT 258 12/30/2020  ? NEUTROABS 4.2 12/30/2020  ? ? ?CMP  ?Lab Results  ?Component Value Date  ? NA 140 12/30/2020  ? K 4.9 12/30/2020  ? CL 107 12/30/2020  ? CO2 24 12/30/2020  ? GLUCOSE 78 12/30/2020  ? BUN 12 12/30/2020  ? CREATININE 0.73 12/30/2020  ? CALCIUM 9.7 12/30/2020  ? PROT 7.3 12/30/2020  ? ALBUMIN 3.9 12/30/2020  ? AST 15 12/30/2020  ? ALT 10 12/30/2020  ? ALKPHOS 63 12/30/2020  ? BILITOT 0.4 12/30/2020  ? GFRNONAA >60 12/30/2020  ? GFRAA >60 03/24/2017  ? ? ?Lab Results  ?Component Value Date  ? CEA1 6.3 (H) 10/20/2020  ? CEA 1.82 12/30/2020  ? ? ?Medications: I have reviewed the patient's current medications. ? ? 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  ?2.   Cecum perforation/septic shock secondary to an obstructing sigmoid colon cancer, status post a right colectomy on 10/24/2020 ?3.   Diabetes ?4.   Mental retardation ?5.   Hypertension ? ? ? ?Disposition: ?Mark Chang remains in clinical remission from colon cancer.  He has lost weight over the past several months.  He does not have nausea or abdominal pain he.  He reports eating daily.  I doubt the weight loss is related to progression of metastatic colon cancer, but this is possible.  He will return for an office visit and CEA in 2 months.  We discussed the indication for surveillance imaging today.   ?The lesion at the left abdominal wall appears to be a sebaceous cyst. ? ? ? ?Betsy Coder, MD ? ?07/01/2021  ?9:07 AM ? ? ?

## 2021-09-09 ENCOUNTER — Other Ambulatory Visit: Payer: Self-pay

## 2021-09-09 ENCOUNTER — Inpatient Hospital Stay: Payer: Medicare HMO | Attending: Oncology

## 2021-09-09 ENCOUNTER — Inpatient Hospital Stay (HOSPITAL_BASED_OUTPATIENT_CLINIC_OR_DEPARTMENT_OTHER): Payer: Medicare HMO | Admitting: Nurse Practitioner

## 2021-09-09 ENCOUNTER — Encounter: Payer: Self-pay | Admitting: Nurse Practitioner

## 2021-09-09 VITALS — BP 143/78 | HR 85 | Temp 98.1°F | Resp 18 | Ht 67.0 in | Wt 215.0 lb

## 2021-09-09 DIAGNOSIS — Z923 Personal history of irradiation: Secondary | ICD-10-CM | POA: Diagnosis not present

## 2021-09-09 DIAGNOSIS — C187 Malignant neoplasm of sigmoid colon: Secondary | ICD-10-CM

## 2021-09-09 DIAGNOSIS — Z85038 Personal history of other malignant neoplasm of large intestine: Secondary | ICD-10-CM | POA: Diagnosis present

## 2021-09-09 DIAGNOSIS — Z933 Colostomy status: Secondary | ICD-10-CM | POA: Insufficient documentation

## 2021-09-09 LAB — CMP (CANCER CENTER ONLY)
ALT: 13 U/L (ref 0–44)
AST: 16 U/L (ref 15–41)
Albumin: 4.1 g/dL (ref 3.5–5.0)
Alkaline Phosphatase: 64 U/L (ref 38–126)
Anion gap: 10 (ref 5–15)
BUN: 27 mg/dL — ABNORMAL HIGH (ref 8–23)
CO2: 29 mmol/L (ref 22–32)
Calcium: 9.8 mg/dL (ref 8.9–10.3)
Chloride: 103 mmol/L (ref 98–111)
Creatinine: 1.17 mg/dL (ref 0.61–1.24)
GFR, Estimated: >60 mL/min (ref >60)
Glucose, Bld: 101 mg/dL — ABNORMAL HIGH (ref 70–99)
Potassium: 4.9 mmol/L (ref 3.5–5.1)
Sodium: 142 mmol/L (ref 135–145)
Total Bilirubin: 0.7 mg/dL (ref 0.3–1.2)
Total Protein: 7.3 g/dL (ref 6.5–8.1)

## 2021-09-09 LAB — CEA (ACCESS): CEA (CHCC): 2.47 ng/mL (ref 0.00–5.00)

## 2021-09-09 NOTE — Progress Notes (Signed)
  [place] OFFICE PROGRESS NOTE   Diagnosis: Colon cancer  INTERVAL HISTORY:   Mark Chang returns as scheduled.  He reports a good appetite.  Colostomy functioning normally.  He has periodic abdominal pain.  The pain does not occur on a consistent basis.  Objective:  Vital signs in last 24 hours:  Blood pressure (!) 143/78, pulse 85, temperature 98.1 F (36.7 C), temperature source Oral, resp. rate 18, height $RemoveBe'5\' 7"'ttPwElZen$  (1.702 m), weight 215 lb (97.5 kg), SpO2 100 %.   Lymphatics: No palpable cervical, supraclavicular, axillary or inguinal lymph nodes. Resp: Lungs clear bilaterally. Cardio: Regular rate and rhythm. GI: Abdomen soft and nontender.  No hepatosplenomegaly.  Left lower quadrant colostomy. Vascular: No leg edema.   Lab Results:  Lab Results  Component Value Date   WBC 6.9 12/30/2020   HGB 10.8 (L) 12/30/2020   HCT 35.7 (L) 12/30/2020   MCV 85.6 12/30/2020   PLT 258 12/30/2020   NEUTROABS 4.2 12/30/2020    Imaging:  No results found.  Medications: I have reviewed the patient's current medications.  Assessment/Plan: Sigmoid colon cancer, stage IIIb (T3N1b), status post a left colectomy with end colostomy 10/16/2020 Tumor involves paraglottic soft tissue, 3/12 lymph nodes positive, 1 tumor deposit, lymphovascular invasion present, no loss of mismatch repair protein expression, MSS CT abdomen/pelvis 10/19/2020-pneumatosis intestinalis involving the cecum and ascending colon, constricting mass in the sigmoid colon, liver morphology suggestive of cirrhosis, gallstones CT chest 11/02/2020 -7 mm right upper lobe nodule, small right pleural effusion   2.   Cecum perforation/septic shock secondary to an obstructing sigmoid colon cancer, status post a right colectomy on 10/24/2020 3.   Diabetes 4.   Mental retardation 5.   Hypertension    Disposition: Mark Chang appears stable.  There is no clinical evidence of disease progression.  Weight is back to  baseline.  We will follow-up on the CEA from today.  He will return for a CEA and follow-up visit in 3 months.  We are available to see him sooner if needed.  I contacted his brother by phone to relay the above information.  Ned Card ANP/GNP-BC   09/09/2021  9:04 AM

## 2021-09-12 ENCOUNTER — Telehealth: Payer: Self-pay

## 2021-09-12 NOTE — Telephone Encounter (Signed)
-----   Message from Owens Shark, NP sent at 09/11/2021  4:16 PM EDT ----- Please let his brother know the CEA is in normal range.  Follow-up as scheduled.

## 2021-09-12 NOTE — Telephone Encounter (Signed)
Patient gave verbal understanding and had no further questions or concerns  

## 2021-12-16 ENCOUNTER — Inpatient Hospital Stay: Payer: Medicare HMO | Attending: Oncology

## 2021-12-16 ENCOUNTER — Inpatient Hospital Stay (HOSPITAL_BASED_OUTPATIENT_CLINIC_OR_DEPARTMENT_OTHER): Payer: Medicare HMO | Admitting: Oncology

## 2021-12-16 ENCOUNTER — Telehealth: Payer: Self-pay | Admitting: *Deleted

## 2021-12-16 VITALS — BP 112/72 | HR 99 | Temp 98.2°F | Resp 18 | Ht 67.0 in | Wt 218.0 lb

## 2021-12-16 DIAGNOSIS — C187 Malignant neoplasm of sigmoid colon: Secondary | ICD-10-CM | POA: Diagnosis not present

## 2021-12-16 DIAGNOSIS — K9409 Other complications of colostomy: Secondary | ICD-10-CM | POA: Insufficient documentation

## 2021-12-16 DIAGNOSIS — Z85038 Personal history of other malignant neoplasm of large intestine: Secondary | ICD-10-CM | POA: Insufficient documentation

## 2021-12-16 LAB — CEA (ACCESS): CEA (CHCC): 3.05 ng/mL (ref 0.00–5.00)

## 2021-12-16 NOTE — Progress Notes (Signed)
Call to facility to send copy of MAR to review medications.

## 2021-12-16 NOTE — Telephone Encounter (Addendum)
Left VM for brother, Mark Chang to call office for update on visit with Dr. Benay Spice today. Faxed office note, appointment calendar and order to d/c colace and give Imodium tid prn diarrhea.

## 2021-12-16 NOTE — Progress Notes (Signed)
  [place] [facility] OFFICE PROGRESS NOTE   Diagnosis: Colon cancer  INTERVAL HISTORY:   Mark Chang returns for a scheduled visit.  He is here with a caretaker from the assisted living facility.  She reports he has a good appetite.  He has frequent output from the colostomy.  The stool is soft.  He is taking Colace and Imodium.  Mark Chang reports intermittent discomfort at the colostomy site.  Objective:  Vital signs in last 24 hours:  Blood pressure 112/72, pulse 99, temperature 98.2 F (36.8 C), temperature source Oral, resp. rate 18, height $RemoveBe'5\' 7"'ruTFXWNae$  (1.702 m), weight 218 lb (98.9 kg), SpO2 100 %.  Lymphatics: No cervical, supraclavicular, axillary, or inguinal nodes Resp: Lungs clear bilaterally  Cardio: Regular rate and rhythm GI: No hepatosplenomegaly, nontender, left lower quadrant colostomy with soft brown stool Vascular: No leg edema   Lab Results:  Lab Results  Component Value Date   WBC 6.9 12/30/2020   HGB 10.8 (L) 12/30/2020   HCT 35.7 (L) 12/30/2020   MCV 85.6 12/30/2020   PLT 258 12/30/2020   NEUTROABS 4.2 12/30/2020    CMP  Lab Results  Component Value Date   NA 142 09/09/2021   K 4.9 09/09/2021   CL 103 09/09/2021   CO2 29 09/09/2021   GLUCOSE 101 (H) 09/09/2021   BUN 27 (H) 09/09/2021   CREATININE 1.17 09/09/2021   CALCIUM 9.8 09/09/2021   PROT 7.3 09/09/2021   ALBUMIN 4.1 09/09/2021   AST 16 09/09/2021   ALT 13 09/09/2021   ALKPHOS 64 09/09/2021   BILITOT 0.7 09/09/2021   GFRNONAA >60 09/09/2021   GFRAA >60 03/24/2017    Lab Results  Component Value Date   CEA1 6.3 (H) 10/20/2020   CEA 3.05 12/16/2021    Medications: I have reviewed the patient's current medications.   Assessment/Plan: Sigmoid colon cancer, stage IIIb (T3N1b), status post a left colectomy with end colostomy 10/16/2020 Tumor involves paraglottic soft tissue, 3/12 lymph nodes positive, 1 tumor deposit, lymphovascular invasion present, no loss of mismatch repair  protein expression, MSS CT abdomen/pelvis 10/19/2020-pneumatosis intestinalis involving the cecum and ascending colon, constricting mass in the sigmoid colon, liver morphology suggestive of cirrhosis, gallstones CT chest 11/02/2020 -7 mm right upper lobe nodule, small right pleural effusion   2.   Cecum perforation/septic shock secondary to an obstructing sigmoid colon cancer, status post a right colectomy on 10/24/2020 3.   Diabetes 4.   Mental retardation 5.   Hypertension      Disposition: Mark Chang remains in clinical remission from colon cancer.  We will contact the assisted living facility to clarify his medical regimen.  We will recommend adjusting the medical regimen if he is taking both a stool softener and Imodium.  We will contact his brother to discuss the indication for repeat chest CT to follow-up on the right lung nodule noted last year.  Mark Chang will be scheduled for office visit and CEA in 4 months.  Betsy Coder, MD  12/16/2021  11:46 AM

## 2021-12-31 ENCOUNTER — Other Ambulatory Visit: Payer: Self-pay | Admitting: *Deleted

## 2021-12-31 ENCOUNTER — Telehealth: Payer: Self-pay | Admitting: *Deleted

## 2021-12-31 DIAGNOSIS — C187 Malignant neoplasm of sigmoid colon: Secondary | ICD-10-CM

## 2021-12-31 NOTE — Telephone Encounter (Signed)
Provided brother and [facility] appointment for CT scan of chest at [facility] for 10/31 at 01/26/1099--no prep. Also faxed appointment information to facility.

## 2021-12-31 NOTE — Progress Notes (Signed)
Per managed care, insurance requires patient to go to GI for scan. New order placed. Called and left VM for his brother (POA),  Aurthur Wingerter to call office to discuss the CT scan.

## 2022-01-08 ENCOUNTER — Ambulatory Visit (HOSPITAL_BASED_OUTPATIENT_CLINIC_OR_DEPARTMENT_OTHER): Payer: Medicare HMO

## 2022-01-27 ENCOUNTER — Ambulatory Visit
Admission: RE | Admit: 2022-01-27 | Discharge: 2022-01-27 | Disposition: A | Discharge status: Home / Self Care | Payer: Medicare HMO | Source: Ambulatory Visit | Attending: Oncology | Admitting: Oncology

## 2022-01-27 DIAGNOSIS — C187 Malignant neoplasm of sigmoid colon: Secondary | ICD-10-CM

## 2022-01-30 ENCOUNTER — Telehealth: Payer: Self-pay | Admitting: *Deleted

## 2022-01-30 NOTE — Telephone Encounter (Signed)
Per Dr. Benay Spice: CT is stable. F/U as scheduled. Notified his brother and faxed report to [employer].

## 2022-04-17 ENCOUNTER — Inpatient Hospital Stay (HOSPITAL_BASED_OUTPATIENT_CLINIC_OR_DEPARTMENT_OTHER): Payer: Medicare HMO | Admitting: Nurse Practitioner

## 2022-04-17 ENCOUNTER — Encounter: Payer: Self-pay | Admitting: Nurse Practitioner

## 2022-04-17 ENCOUNTER — Inpatient Hospital Stay: Payer: Medicare HMO | Attending: Oncology

## 2022-04-17 VITALS — BP 159/73 | HR 87 | Temp 98.2°F | Resp 18 | Ht 67.0 in | Wt 232.4 lb

## 2022-04-17 DIAGNOSIS — C187 Malignant neoplasm of sigmoid colon: Secondary | ICD-10-CM

## 2022-04-17 DIAGNOSIS — Z933 Colostomy status: Secondary | ICD-10-CM | POA: Insufficient documentation

## 2022-04-17 DIAGNOSIS — R911 Solitary pulmonary nodule: Secondary | ICD-10-CM | POA: Diagnosis not present

## 2022-04-17 DIAGNOSIS — E119 Type 2 diabetes mellitus without complications: Secondary | ICD-10-CM | POA: Insufficient documentation

## 2022-04-17 DIAGNOSIS — Z9049 Acquired absence of other specified parts of digestive tract: Secondary | ICD-10-CM | POA: Diagnosis not present

## 2022-04-17 DIAGNOSIS — F79 Unspecified intellectual disabilities: Secondary | ICD-10-CM | POA: Insufficient documentation

## 2022-04-17 DIAGNOSIS — I10 Essential (primary) hypertension: Secondary | ICD-10-CM | POA: Insufficient documentation

## 2022-04-17 DIAGNOSIS — Z85038 Personal history of other malignant neoplasm of large intestine: Secondary | ICD-10-CM | POA: Insufficient documentation

## 2022-04-17 LAB — CEA (ACCESS): CEA (CHCC): 5.67 ng/mL — ABNORMAL HIGH (ref 0.00–5.00)

## 2022-04-17 NOTE — Progress Notes (Signed)
  [place] OFFICE PROGRESS NOTE   Diagnosis: Colon cancer  INTERVAL HISTORY:   Mark Chang returns as scheduled.  He is accompanied by his brother.  He feels well.  He has good appetite.  He is gaining weight.  Colostomy functioning normally.  He intermittently notes skin irritation related to the colostomy.  Objective:  Vital signs in last 24 hours:  Blood pressure (!) 159/73, pulse 87, temperature 98.2 F (36.8 C), temperature source Oral, resp. rate 18, height '5\' 7"'$  (1.702 m), weight 232 lb 6.4 oz (105.4 kg), SpO2 100 %.    Lymphatics: No palpable cervical, supraclavicular, axillary or inguinal lymph nodes. Resp: Lungs clear bilaterally. Cardio: Regular rate and rhythm. GI: Abdomen soft and nontender.  No hepatosplenomegaly.  Left lower quadrant colostomy with partially formed stool in the collection bag. Vascular: No leg edema.   Lab Results:  Lab Results  Component Value Date   WBC 6.9 12/30/2020   HGB 10.8 (L) 12/30/2020   HCT 35.7 (L) 12/30/2020   MCV 85.6 12/30/2020   PLT 258 12/30/2020   NEUTROABS 4.2 12/30/2020    Imaging:  No results found.  Medications: I have reviewed the patient's current medications.  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   2.   Cecum perforation/septic shock secondary to an obstructing sigmoid colon cancer, status post a right  colectomy on 10/24/2020 3.   Diabetes 4.   Mental retardation 5.   Hypertension    Disposition: [name] remains in clinical remission from colon cancer.  We will follow-up on the CEA from today.  I reviewed the CT chest result from 01/27/2022 with him and his brother.  The right upper lobe lung nodule is stable, no new nodules.  He will return for a CEA and follow-up visit in 4 months.    Ned Card ANP/GNP-BC   04/17/2022  11:01 AM

## 2022-04-20 ENCOUNTER — Other Ambulatory Visit: Payer: Self-pay | Admitting: Nurse Practitioner

## 2022-04-20 DIAGNOSIS — C187 Malignant neoplasm of sigmoid colon: Secondary | ICD-10-CM

## 2022-04-21 ENCOUNTER — Telehealth: Payer: Self-pay

## 2022-04-21 NOTE — Telephone Encounter (Signed)
-----  Message from Owens Shark, NP sent at 04/17/2022  3:31 PM EST ----- Please let his brother know the CEA returned mildly elevated.  Next step would be for CT scans.  Please let me know when he would like to get scans scheduled.

## 2022-04-21 NOTE — Telephone Encounter (Signed)
Patient is schedule for his ct scan. Mrs. Philipp Ovens is aware. I faxed over his appointment reminder to Mrs. Blackwell.

## 2022-04-27 ENCOUNTER — Ambulatory Visit (HOSPITAL_BASED_OUTPATIENT_CLINIC_OR_DEPARTMENT_OTHER)
Admission: RE | Admit: 2022-04-27 | Discharge: 2022-04-27 | Disposition: A | Discharge status: Home / Self Care | Payer: Medicare HMO | Source: Ambulatory Visit | Attending: Nurse Practitioner | Admitting: Nurse Practitioner

## 2022-04-27 ENCOUNTER — Inpatient Hospital Stay: Payer: Medicare HMO

## 2022-04-27 ENCOUNTER — Ambulatory Visit (HOSPITAL_COMMUNITY): Payer: Medicare HMO

## 2022-04-27 DIAGNOSIS — C187 Malignant neoplasm of sigmoid colon: Secondary | ICD-10-CM | POA: Insufficient documentation

## 2022-04-27 DIAGNOSIS — Z85038 Personal history of other malignant neoplasm of large intestine: Secondary | ICD-10-CM | POA: Diagnosis not present

## 2022-04-27 LAB — BASIC METABOLIC PANEL - CANCER CENTER ONLY
Anion gap: 8 (ref 5–15)
BUN: 30 mg/dL — ABNORMAL HIGH (ref 8–23)
CO2: 29 mmol/L (ref 22–32)
Calcium: 9.8 mg/dL (ref 8.9–10.3)
Chloride: 103 mmol/L (ref 98–111)
Creatinine: 1.1 mg/dL (ref 0.61–1.24)
GFR, Estimated: >60 mL/min (ref >60)
Glucose, Bld: 111 mg/dL — ABNORMAL HIGH (ref 70–99)
Potassium: 4.4 mmol/L (ref 3.5–5.1)
Sodium: 140 mmol/L (ref 135–145)

## 2022-04-27 MED ORDER — IOHEXOL 350 MG/ML SOLN
100.0000 mL | Freq: Once | INTRAVENOUS | Status: AC | PRN
  Administered 2022-04-27: 80 mL via INTRAVENOUS

## 2022-05-16 ENCOUNTER — Emergency Department (HOSPITAL_COMMUNITY): Payer: Medicare HMO

## 2022-05-16 ENCOUNTER — Encounter (HOSPITAL_COMMUNITY): Payer: Self-pay

## 2022-05-16 ENCOUNTER — Observation Stay (HOSPITAL_COMMUNITY)
Admission: EM | Admit: 2022-05-16 | Discharge: 2022-05-18 | Disposition: A | Discharge status: Home / Self Care | Payer: Medicare HMO | Attending: Family Medicine | Admitting: Family Medicine

## 2022-05-16 ENCOUNTER — Other Ambulatory Visit: Payer: Self-pay

## 2022-05-16 DIAGNOSIS — J449 Chronic obstructive pulmonary disease, unspecified: Secondary | ICD-10-CM | POA: Diagnosis not present

## 2022-05-16 DIAGNOSIS — I5042 Chronic combined systolic (congestive) and diastolic (congestive) heart failure: Secondary | ICD-10-CM

## 2022-05-16 DIAGNOSIS — I10 Essential (primary) hypertension: Secondary | ICD-10-CM | POA: Diagnosis present

## 2022-05-16 DIAGNOSIS — Z85038 Personal history of other malignant neoplasm of large intestine: Secondary | ICD-10-CM | POA: Diagnosis not present

## 2022-05-16 DIAGNOSIS — I5022 Chronic systolic (congestive) heart failure: Secondary | ICD-10-CM | POA: Insufficient documentation

## 2022-05-16 DIAGNOSIS — Z794 Long term (current) use of insulin: Secondary | ICD-10-CM | POA: Insufficient documentation

## 2022-05-16 DIAGNOSIS — E1122 Type 2 diabetes mellitus with diabetic chronic kidney disease: Secondary | ICD-10-CM | POA: Diagnosis not present

## 2022-05-16 DIAGNOSIS — Z7985 Long-term (current) use of injectable non-insulin antidiabetic drugs: Secondary | ICD-10-CM | POA: Diagnosis not present

## 2022-05-16 DIAGNOSIS — I5043 Acute on chronic combined systolic (congestive) and diastolic (congestive) heart failure: Secondary | ICD-10-CM | POA: Insufficient documentation

## 2022-05-16 DIAGNOSIS — I480 Paroxysmal atrial fibrillation: Principal | ICD-10-CM | POA: Insufficient documentation

## 2022-05-16 DIAGNOSIS — Z9049 Acquired absence of other specified parts of digestive tract: Secondary | ICD-10-CM | POA: Insufficient documentation

## 2022-05-16 DIAGNOSIS — I130 Hypertensive heart and chronic kidney disease with heart failure and stage 1 through stage 4 chronic kidney disease, or unspecified chronic kidney disease: Secondary | ICD-10-CM | POA: Diagnosis not present

## 2022-05-16 DIAGNOSIS — L0390 Cellulitis, unspecified: Secondary | ICD-10-CM | POA: Insufficient documentation

## 2022-05-16 DIAGNOSIS — K9409 Other complications of colostomy: Secondary | ICD-10-CM | POA: Diagnosis present

## 2022-05-16 DIAGNOSIS — E1129 Type 2 diabetes mellitus with other diabetic kidney complication: Secondary | ICD-10-CM | POA: Diagnosis present

## 2022-05-16 DIAGNOSIS — Z79899 Other long term (current) drug therapy: Secondary | ICD-10-CM | POA: Insufficient documentation

## 2022-05-16 DIAGNOSIS — N183 Chronic kidney disease, stage 3 unspecified: Secondary | ICD-10-CM | POA: Diagnosis not present

## 2022-05-16 DIAGNOSIS — L03311 Cellulitis of abdominal wall: Secondary | ICD-10-CM | POA: Insufficient documentation

## 2022-05-16 DIAGNOSIS — E871 Hypo-osmolality and hyponatremia: Secondary | ICD-10-CM | POA: Diagnosis not present

## 2022-05-16 DIAGNOSIS — C187 Malignant neoplasm of sigmoid colon: Secondary | ICD-10-CM | POA: Diagnosis present

## 2022-05-16 DIAGNOSIS — F79 Unspecified intellectual disabilities: Secondary | ICD-10-CM | POA: Insufficient documentation

## 2022-05-16 DIAGNOSIS — I4891 Unspecified atrial fibrillation: Secondary | ICD-10-CM

## 2022-05-16 LAB — COMPREHENSIVE METABOLIC PANEL
ALT: 16 U/L (ref 0–44)
AST: 21 U/L (ref 15–41)
Albumin: 3.5 g/dL (ref 3.5–5.0)
Alkaline Phosphatase: 70 U/L (ref 38–126)
Anion gap: 9 (ref 5–15)
BUN: 21 mg/dL (ref 8–23)
CO2: 25 mmol/L (ref 22–32)
Calcium: 8.7 mg/dL — ABNORMAL LOW (ref 8.9–10.3)
Chloride: 99 mmol/L (ref 98–111)
Creatinine, Ser: 1.08 mg/dL (ref 0.61–1.24)
GFR, Estimated: >60 mL/min (ref >60)
Glucose, Bld: 106 mg/dL — ABNORMAL HIGH (ref 70–99)
Potassium: 3.7 mmol/L (ref 3.5–5.1)
Sodium: 133 mmol/L — ABNORMAL LOW (ref 135–145)
Total Bilirubin: 0.8 mg/dL (ref 0.3–1.2)
Total Protein: 7 g/dL (ref 6.5–8.1)

## 2022-05-16 LAB — PROTIME-INR
INR: 1.1 (ref 0.8–1.2)
Prothrombin Time: 13.6 seconds (ref 11.4–15.2)

## 2022-05-16 LAB — CBC WITH DIFFERENTIAL/PLATELET
Abs Immature Granulocytes: 0.08 10*3/uL — ABNORMAL HIGH (ref 0.00–0.07)
Basophils Absolute: 0.1 10*3/uL (ref 0.0–0.1)
Basophils Relative: 1 %
Eosinophils Absolute: 0.2 10*3/uL (ref 0.0–0.5)
Eosinophils Relative: 2 %
HCT: 42.7 % (ref 39.0–52.0)
Hemoglobin: 13.9 g/dL (ref 13.0–17.0)
Immature Granulocytes: 1 %
Lymphocytes Relative: 27 %
Lymphs Abs: 2.6 10*3/uL (ref 0.7–4.0)
MCH: 29.3 pg (ref 26.0–34.0)
MCHC: 32.6 g/dL (ref 30.0–36.0)
MCV: 89.9 fL (ref 80.0–100.0)
Monocytes Absolute: 0.7 10*3/uL (ref 0.1–1.0)
Monocytes Relative: 7 %
Neutro Abs: 5.9 10*3/uL (ref 1.7–7.7)
Neutrophils Relative %: 62 %
Platelets: 202 10*3/uL (ref 150–400)
RBC: 4.75 MIL/uL (ref 4.22–5.81)
RDW: 13.5 % (ref 11.5–15.5)
WBC: 9.6 10*3/uL (ref 4.0–10.5)
nRBC: 0 % (ref 0.0–0.2)

## 2022-05-16 LAB — APTT: aPTT: 29 seconds (ref 24–36)

## 2022-05-16 LAB — TSH: TSH: 1.156 u[IU]/mL (ref 0.350–4.500)

## 2022-05-16 LAB — TROPONIN I (HIGH SENSITIVITY): Troponin I (High Sensitivity): 10 ng/L (ref <18)

## 2022-05-16 LAB — BRAIN NATRIURETIC PEPTIDE: B Natriuretic Peptide: 83.1 pg/mL (ref 0.0–100.0)

## 2022-05-16 MED ORDER — HEPARIN BOLUS VIA INFUSION
4000.0000 [IU] | Freq: Once | INTRAVENOUS | Status: AC
  Administered 2022-05-16: 4000 [IU] via INTRAVENOUS
  Filled 2022-05-16: qty 4000

## 2022-05-16 MED ORDER — DILTIAZEM HCL 25 MG/5ML IV SOLN
10.0000 mg | Freq: Once | INTRAVENOUS | Status: AC
  Administered 2022-05-16: 10 mg via INTRAVENOUS
  Filled 2022-05-16: qty 5

## 2022-05-16 MED ORDER — SODIUM CHLORIDE 0.9 % IV SOLN
100.0000 mg | Freq: Once | INTRAVENOUS | Status: AC
  Administered 2022-05-16: 100 mg via INTRAVENOUS
  Filled 2022-05-16: qty 100

## 2022-05-16 MED ORDER — DILTIAZEM HCL-DEXTROSE 125-5 MG/125ML-% IV SOLN (PREMIX)
5.0000 mg/h | INTRAVENOUS | Status: DC
  Administered 2022-05-17: 5 mg/h via INTRAVENOUS
  Filled 2022-05-16: qty 125

## 2022-05-16 MED ORDER — HEPARIN (PORCINE) 25000 UT/250ML-% IV SOLN
1350.0000 [IU]/h | INTRAVENOUS | Status: DC
  Administered 2022-05-16: 1350 [IU]/h via INTRAVENOUS
  Filled 2022-05-16: qty 250

## 2022-05-16 MED ORDER — DILTIAZEM HCL 25 MG/5ML IV SOLN
INTRAVENOUS | Status: AC
  Filled 2022-05-16: qty 5

## 2022-05-16 NOTE — ED Provider Notes (Addendum)
[name] EMERGENCY DEPARTMENT AT [facility] Provider Note   CSN: [id number] Arrival date & time: 05/16/22  1936     History Chief Complaint  Patient presents with   Wound Check    Mark Chang is a 77 y.o. male. Patient with history of CKF stage 3, COPD, Type 2 DM, HLD, HTN, and sigmoid colon resection status post ostomy placement who presents to the ED for ostomy bag replacement. Patient has previously been seen for ostomy bag replacement multiple times as it appears that staff at [facility] are unable to replace the bag for him. He has been brought into ED on 3 different occasions for ostomy bag replacement. Staff there concerned for possible infection as site is more red, but no discharge has been noted beyond fecal contents.   Wound Check       Home Medications Prior to Admission medications   Medication Sig Start Date End Date Taking? Authorizing Provider  acetaminophen (TYLENOL) 500 MG tablet Take 500 mg by mouth 3 (three) times daily.    [provider]  atorvastatin (LIPITOR) 40 MG tablet Take 40 mg by mouth at bedtime. 07/03/19   [provider]  carboxymethylcellulose (REFRESH PLUS) 0.5 % SOLN Place 1 drop into both eyes 2 (two) times daily.    [provider]  dextrose (GLUTOSE) 40 % GEL Take 1 Tube by mouth once as needed for low blood sugar. Patient not taking: Reported on 12/16/2021    [provider]  escitalopram (LEXAPRO) 10 MG tablet Take 10 mg by mouth daily.    [provider]  famotidine (PEPCID) 20 MG tablet Take 20 mg by mouth 2 (two) times daily. 06/12/19   [provider]  feeding supplement (ENSURE ENLIVE / ENSURE PLUS) LIQD Take 237 mLs by mouth 3 (three) times daily between meals. 11/06/20   Antonieta Pert, MD  gabapentin (NEURONTIN) 100 MG capsule Take 100 mg by mouth 2 (two) times daily.  07/03/19   [provider]  insulin aspart (NOVOLOG FLEXPEN) 100 UNIT/ML FlexPen Inject 5 Units  into the skin 3 (three) times daily with meals. 11/06/20   Antonieta Pert, MD  insulin aspart (NOVOLOG) 100 UNIT/ML injection CBG 70 - 120: 0 units CBG 121 - 150: 2 units CBG 151 - 200: 3 units CBG 201 - 250: 5 units CBG 251 - 300: 8 units CBG 301 - 350: 11 units CBG 351 - 400: 15 units 07/23/16   Hosie Poisson, MD  Insulin Glargine (BASAGLAR KWIKPEN) 100 UNIT/ML Inject 40 Units into the skin at bedtime. 11/06/20   Antonieta Pert, MD  lisinopril (ZESTRIL) 20 MG tablet Take 1 tablet (20 mg total) by mouth daily. 11/07/20   Antonieta Pert, MD  loperamide (IMODIUM) 2 MG capsule Take 1 capsule (2 mg total) by mouth 3 (three) times daily. 11/06/20   Antonieta Pert, MD  Melatonin 5 MG SUBL Take 10 mg by mouth at bedtime.    [provider]  Multiple Vitamin (MULTIVITAMIN WITH MINERALS) TABS tablet Take 1 tablet by mouth daily. 11/07/20   Antonieta Pert, MD  Nutritional Supplements (,FEEDING SUPPLEMENT, PROSOURCE PLUS) liquid Take 30 mLs by mouth 2 (two) times daily between meals. 11/06/20   Antonieta Pert, MD  polycarbophil (FIBERCON) 625 MG tablet Take 1 tablet (625 mg total) by mouth 2 (two) times daily. 11/06/20   Antonieta Pert, MD  TRULICITY 1.5 0000000 SOPN Inject 1.5 mg into the skin every Thursday. 06/13/19   [provider]  vitamin B-12 (CYANOCOBALAMIN) 1000 MCG tablet Take 1,000 mcg by mouth daily.    [provider]      Allergies    Patient has no known allergies.    Review of Systems   Review of Systems  Constitutional:  Negative for chills and fever.  Skin:  Positive for color change. Negative for wound.  All other systems reviewed and are negative.   Physical Exam Updated Vital Signs BP (!) 151/102 (BP Location: Right Arm)   Pulse (!) 110   Temp 97.8 F (36.6 C) (Oral)   Resp 18   Ht 5' 7"$  (1.702 m)   Wt 105.3 kg   SpO2 100%   BMI 36.36 kg/m  Physical Exam Vitals and nursing note reviewed.  Constitutional:      General: He is not in acute distress.    Appearance: Normal  appearance. He is not ill-appearing.  HENT:     Head: Normocephalic and atraumatic.  Eyes:     Conjunctiva/sclera: Conjunctivae normal.  Cardiovascular:     Rate and Rhythm: Regular rhythm. Tachycardia present.     Pulses: Normal pulses.     Heart sounds: Normal heart sounds.  Pulmonary:     Effort: Pulmonary effort is normal. No respiratory distress.     Breath sounds: No wheezing.  Abdominal:     General: Abdomen is flat. There is no distension.     Tenderness: There is no abdominal tenderness.  Skin:    General: Skin is warm.     Capillary Refill: Capillary refill takes less than 2 seconds.     Findings: Erythema present.     Comments: Peristomal erythema without purulence.  Picture attached  Neurological:     Mental Status: He is alert.     ED Results / Procedures / Treatments   Labs (all labs ordered are listed, but only abnormal results are displayed) Labs Reviewed  CBC WITH DIFFERENTIAL/PLATELET  COMPREHENSIVE METABOLIC PANEL  TSH  T4, FREE  BRAIN NATRIURETIC PEPTIDE  TROPONIN I (HIGH SENSITIVITY)    EKG EKG Interpretation  Date/Time:  Saturday May 16 2022 22:24:47 EST Ventricular Rate:  132 PR Interval:    QRS Duration: 91 QT Interval:  335 QTC Calculation: 497 R Axis:   227 Text Interpretation: Atrial fibrillation with RVR S1,S2,S3 pattern Anterior infarct, old Confirmed by Regan Lemming ([id number]) on 05/16/2022 10:37:20 PM  Radiology DG Chest Portable 1 View  Result Date: 05/16/2022 CLINICAL DATA:  Atrial fibrillation, COPD, diabetes EXAM: PORTABLE CHEST 1 VIEW COMPARISON:  10/29/2020 FINDINGS: Single frontal view of the chest demonstrates a stable cardiac silhouette. Lung volumes are diminished, without airspace disease, effusion, or pneumothorax. Chronic elevation right hemidiaphragm. No acute bony abnormalities. IMPRESSION: 1. Low lung volumes.  No acute process. Electronically Signed   By: Randa Ngo M.D.   On: 05/16/2022 22:26     Procedures .Critical Care  Performed by: Luvenia Heller, PA-C Authorized by: Luvenia Heller, PA-C   Critical care provider statement:    Critical care time (minutes):  30   Critical care start time:  05/16/2022 10:15 PM   Critical care end time:  05/16/2022 10:45 PM   Critical care time was exclusive of:  Separately billable procedures and treating other patients   Critical care was necessary to treat or prevent imminent or life-threatening deterioration of the following conditions:  Circulatory failure   Critical care was time spent personally by me on the following activities:  Development of treatment plan with patient or  surrogate, discussions with consultants, evaluation of patient's response to treatment, examination of patient, ordering and review of laboratory studies, ordering and review of radiographic studies, ordering and performing treatments and interventions, pulse oximetry, re-evaluation of patient's condition and review of old charts   I assumed direction of critical care for this patient from another provider in my specialty: no      Medications Ordered in ED Medications  diltiazem (CARDIZEM) injection 10 mg (has no administration in time range)  doxycycline (VIBRAMYCIN) 100 mg in sodium chloride 0.9 % 250 mL IVPB (has no administration in time range)    ED Course/ Medical Decision Making/ A&P                           Medical Decision Making Amount and/or Complexity of Data Reviewed Labs: ordered. Radiology: ordered.  Risk Prescription drug management.   This patient presents to the ED for concern of wound check. Differential diagnosis includes cellulitis, abscess, tachycardia, a-fib   Lab Tests:  I Ordered, and personally interpreted labs.  The pertinent results include:  Pending CBC, CMP, TSH, Free T4, Troponin, BNP   Imaging Studies ordered:  I ordered imaging studies including chest xray  I independently visualized and interpreted imaging which  showed pending results I agree with the radiologist interpretation   Problem List / ED Course:  Patient presented to the ED for a wound check from assisted living. He reportedly has difficulty having ostomy bag changed at facility and he is needing ostomy bag changed. Otherwise denies any other symptoms including chest pain, shortness of breath, headaches, dizziness, lightheadedness. On initial assessment, skin appears to be somewhat cellulitic but no signs of drainage from ostomy. Patient appeared to be tachycardic on monitor in room but denied any symptoms. EKG performed with unclear P-waves and concern for possible A-fib with RVR. Labs ordered for more extensive workup as patient appears to be more than a simple cellulitis case at this point. Started patient on doxycyline, diltiazem bolus, and heparin per pharmacy consult prior to handoff. 10:41 PM Care of Mark Chang transferred to [facility] Carnes and Dr. Armandina Gemma at the end of my shift as the patient will require reassessment once labs/imaging have resulted. Patient presentation, ED course, and plan of care discussed with review of all pertinent labs and imaging. Please see his/her note for further details regarding further ED course and disposition. Plan at time of handoff is likely admission given first episode of atrial fibrillation. Patient likely also has comorbid cellulitis with potential sepsis pending white count. This may be altered or completely changed at the discretion of the oncoming team pending results of further workup.  Social Determinants of Health:  Patient from SNF  Final Clinical Impression(s) / ED Diagnoses Final diagnoses:  Atrial fibrillation with rapid ventricular response ([place])  Cellulitis of abdominal wall    Rx / DC Orders ED Discharge Orders     None         Luvenia Heller, PA-C 05/16/22 2241    Luvenia Heller, PA-C 05/16/22 2244    Regan Lemming, MD 05/16/22 2326

## 2022-05-16 NOTE — Progress Notes (Signed)
ANTICOAGULATION CONSULT NOTE - Initial Consult  Pharmacy Consult for Heparin Indication: atrial fibrillation  No Known Allergies  Patient Measurements: Height: 5' 7"$  (170.2 cm) Weight: 105.3 kg (232 lb 2.3 oz) IBW/kg (Calculated) : 66.1 Heparin Dosing Weight: 89 kg  Vital Signs: Temp: 97.8 F (36.6 C) (02/17 1944) Temp Source: Oral (02/17 1944) BP: 151/102 (02/17 1944) Pulse Rate: 110 (02/17 1944)  Labs: Recent Labs    05/16/22 2220  HGB 13.9  HCT 42.7  PLT 202    Estimated Creatinine Clearance: 65.1 mL/min (by C-G formula based on SCr of 1.1 mg/dL).   Medical History: Past Medical History:  Diagnosis Date   CKD (chronic kidney disease), stage III ([place]) 03/24/2017   COPD (chronic obstructive pulmonary disease) ([facility])    DM (diabetes mellitus), type 2 with renal complications ([place]) [id number]   Hyperlipidemia    Hypertension    Mental retardation    Obesity, Class III, BMI 40-49.9 (morbid obesity) ([place]) 03/24/2017   Pernicious anemia     Medications:  No oral anticoagulation PTA  Assessment: 77 yr male comes to ED for wound check and change in ostomy bag.  Patient tachycardic and EKG shows AFib with RVR  Goal of Therapy:  Heparin level 0.3-0.7 units/ml Monitor platelets by anticoagulation protocol: Yes   Plan:  Obtain baseline aPTT and PT/INR Heparin 4000 unit IV bolus x 1 Heparin gtt @ 1350 units/hr Check heparin level 8 hr after heparin started Daily heparin level and CBC Monitor for signs and symptoms of bleeding  Everette Rank, PharmD 05/16/2022,11:09 PM

## 2022-05-16 NOTE — Discharge Instructions (Addendum)

## 2022-05-16 NOTE — ED Triage Notes (Signed)
Pt from [facility] and needs colostomy bag. None available at facility. Facility also states it looks infected.

## 2022-05-16 NOTE — ED Triage Notes (Signed)
Facility states that colostomy is red and warm/

## 2022-05-16 NOTE — ED Provider Notes (Signed)
Assumed care at shift change.  See prior notes for full H&P.  Briefly, 77 y.o. M here with wound check of his ostomy, found to be in new onset AFIB RVR.   He was given cardizem bolus with improvement of rate, heparin started.  IV doxycycline started for ostomy cellulitis.  Plan:  awaiting labs.  Will need admission.  Results for orders placed or performed during the hospital encounter of 05/16/22  CBC with Differential  Result Value Ref Range   WBC 9.6 4.0 - 10.5 K/uL   RBC 4.75 4.22 - 5.81 MIL/uL   Hemoglobin 13.9 13.0 - 17.0 g/dL   HCT 42.7 39.0 - 52.0 %   MCV 89.9 80.0 - 100.0 fL   MCH 29.3 26.0 - 34.0 pg   MCHC 32.6 30.0 - 36.0 g/dL   RDW 13.5 11.5 - 15.5 %   Platelets 202 150 - 400 K/uL   nRBC 0.0 0.0 - 0.2 %   Neutrophils Relative % 62 %   Neutro Abs 5.9 1.7 - 7.7 K/uL   Lymphocytes Relative 27 %   Lymphs Abs 2.6 0.7 - 4.0 K/uL   Monocytes Relative 7 %   Monocytes Absolute 0.7 0.1 - 1.0 K/uL   Eosinophils Relative 2 %   Eosinophils Absolute 0.2 0.0 - 0.5 K/uL   Basophils Relative 1 %   Basophils Absolute 0.1 0.0 - 0.1 K/uL   Immature Granulocytes 1 %   Abs Immature Granulocytes 0.08 (H) 0.00 - 0.07 K/uL  Comprehensive metabolic panel  Result Value Ref Range   Sodium 133 (L) 135 - 145 mmol/L   Potassium 3.7 3.5 - 5.1 mmol/L   Chloride 99 98 - 111 mmol/L   CO2 25 22 - 32 mmol/L   Glucose, Bld 106 (H) 70 - 99 mg/dL   BUN 21 8 - 23 mg/dL   Creatinine, Ser 1.08 0.61 - 1.24 mg/dL   Calcium 8.7 (L) 8.9 - 10.3 mg/dL   Total Protein 7.0 6.5 - 8.1 g/dL   Albumin 3.5 3.5 - 5.0 g/dL   AST 21 15 - 41 U/L   ALT 16 0 - 44 U/L   Alkaline Phosphatase 70 38 - 126 U/L   Total Bilirubin 0.8 0.3 - 1.2 mg/dL   GFR, Estimated >60 >60 mL/min   Anion gap 9 5 - 15  Troponin I (High Sensitivity)  Result Value Ref Range   Troponin I (High Sensitivity) 10 <18 ng/L   DG Chest Portable 1 View  Result Date: 05/16/2022 CLINICAL DATA:  Atrial fibrillation, COPD, diabetes EXAM: PORTABLE CHEST  1 VIEW COMPARISON:  10/29/2020 FINDINGS: Single frontal view of the chest demonstrates a stable cardiac silhouette. Lung volumes are diminished, without airspace disease, effusion, or pneumothorax. Chronic elevation right hemidiaphragm. No acute bony abnormalities. IMPRESSION: 1. Low lung volumes.  No acute process. Electronically Signed   By: Randa Ngo M.D.   On: 05/16/2022 22:26   CT CHEST ABDOMEN PELVIS W CONTRAST  Result Date: 04/27/2022 CLINICAL DATA:  Colon cancer restaging, CEA elevation * Tracking Code: BO * EXAM: CT CHEST, ABDOMEN, AND PELVIS WITH CONTRAST TECHNIQUE: Multidetector CT imaging of the chest, abdomen and pelvis was performed following the standard protocol during bolus administration of intravenous contrast. RADIATION DOSE REDUCTION: This exam was performed according to the departmental dose-optimization program which includes automated exposure control, adjustment of the mA and/or kV according to patient size and/or use of iterative reconstruction technique. CONTRAST:  65m OMNIPAQUE IOHEXOL 350 MG/ML SOLN COMPARISON:  CT chest,  01/27/2022, CT abdomen pelvis, 10/30/2020 FINDINGS: CT CHEST FINDINGS Cardiovascular: Aortic atherosclerosis. Normal heart size. Three-vessel coronary artery calcifications. No pericardial effusion. Mediastinum/Nodes: No enlarged mediastinal, hilar, or axillary lymph nodes. Thyroid gland, trachea, and esophagus demonstrate no significant findings. Lungs/Pleura: Examination of the lungs is limited by breath motion artifact. Within this limitation, unchanged 0.7 cm nodule of the peripheral right upper lobe (series 4, image 49). No new nodules. No pleural effusion or pneumothorax. Musculoskeletal: No chest wall abnormality. No acute osseous findings. CT ABDOMEN PELVIS FINDINGS Evaluation of the abdomen and pelvis is generally limited by breath motion artifact. Hepatobiliary: New hypodense lesion of the medial liver dome, hepatic segment VIII measuring 1.9 x 1.9  cm (series 2, image 37). Probable additional very subtle lesion of the posterior liver dome, hepatic segment VII measuring 1.1 x 1.1 cm (series 2, image 43). Contracted gallbladder containing multiple gallstones. No gallbladder wall thickening or biliary ductal dilatation. Pancreas: Unremarkable. No pancreatic ductal dilatation or surrounding inflammatory changes. Spleen: Normal in size without significant abnormality. 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 Multiple simple, benign bilateral renal cortical cysts, for which no further follow-up or characterization is required. Kidneys are otherwise normal, without renal calculi, solid lesion, or hydronephrosis. Bladder is unremarkable. Stomach/Bowel: Stomach is within normal limits. Status post right hemicolectomy and reanastomosis. Additional sigmoid colon resection with left lower quadrant end colostomy. No evidence of bowel wall thickening, distention, or inflammatory changes. Vascular/Lymphatic: No significant vascular findings are present. No enlarged abdominal or pelvic lymph nodes. Nodular soft tissue in the left lower quadrant mesocolon adjacent to the inferior mesenteric artery measuring 2.3 x 1.7 cm (series 2, image 86). Reproductive: No mass or other abnormality. Other: Status post left lower quadrant end colostomy. Small, fat containing parastomal hernia. Fat containing bilateral inguinal hernias. No ascites. Musculoskeletal: No acute osseous findings. IMPRESSION: 1. New hypodense lesion of the medial liver dome, hepatic segment VIII. Probable additional very subtle lesion of the posterior liver dome, hepatic segment VII. 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 2. Nodular soft tissue in the left lower quadrant mesocolon adjacent to the  inferior mesenteric artery, consistent with metastatic disease. 3. Examination of the lungs is limited by breath motion artifact. Within this limitation, unchanged 0.7 cm nodule of the peripheral right upper lobe, most likely benign and incidental given stability over time although a small slowly growing metastasis is not excluded. No new nodules. Attention on follow-up. 4. Status post right hemicolectomy and reanastomosis. Additional sigmoid colon resection with left lower quadrant end colostomy. 5. Cholelithiasis. 6. Coronary artery disease. These results will be called to the ordering clinician or representative by the Radiologist Assistant, and communication documented in the PACS or [employer]. Aortic Atherosclerosis (ICD10-I70.0). Electronically Signed   By: Delanna Ahmadi M.D.   On: 04/27/2022 17:22

## 2022-05-17 ENCOUNTER — Observation Stay (HOSPITAL_BASED_OUTPATIENT_CLINIC_OR_DEPARTMENT_OTHER): Payer: Medicare HMO

## 2022-05-17 ENCOUNTER — Encounter (HOSPITAL_COMMUNITY): Payer: Self-pay | Admitting: Internal Medicine

## 2022-05-17 DIAGNOSIS — I4891 Unspecified atrial fibrillation: Secondary | ICD-10-CM | POA: Diagnosis not present

## 2022-05-17 DIAGNOSIS — I480 Paroxysmal atrial fibrillation: Secondary | ICD-10-CM | POA: Diagnosis not present

## 2022-05-17 LAB — GLUCOSE, CAPILLARY
Glucose-Capillary: 109 mg/dL — ABNORMAL HIGH (ref 70–99)
Glucose-Capillary: 140 mg/dL — ABNORMAL HIGH (ref 70–99)

## 2022-05-17 LAB — T4, FREE: Free T4: 0.94 ng/dL (ref 0.61–1.12)

## 2022-05-17 LAB — CBC
HCT: 39.9 % (ref 39.0–52.0)
Hemoglobin: 13 g/dL (ref 13.0–17.0)
MCH: 29.2 pg (ref 26.0–34.0)
MCHC: 32.6 g/dL (ref 30.0–36.0)
MCV: 89.7 fL (ref 80.0–100.0)
Platelets: 198 10*3/uL (ref 150–400)
RBC: 4.45 MIL/uL (ref 4.22–5.81)
RDW: 13.5 % (ref 11.5–15.5)
WBC: 10.1 10*3/uL (ref 4.0–10.5)
nRBC: 0 % (ref 0.0–0.2)

## 2022-05-17 LAB — COMPREHENSIVE METABOLIC PANEL
ALT: 17 U/L (ref 0–44)
AST: 22 U/L (ref 15–41)
Albumin: 3.1 g/dL — ABNORMAL LOW (ref 3.5–5.0)
Alkaline Phosphatase: 62 U/L (ref 38–126)
Anion gap: 10 (ref 5–15)
BUN: 21 mg/dL (ref 8–23)
CO2: 23 mmol/L (ref 22–32)
Calcium: 8.7 mg/dL — ABNORMAL LOW (ref 8.9–10.3)
Chloride: 105 mmol/L (ref 98–111)
Creatinine, Ser: 1.12 mg/dL (ref 0.61–1.24)
GFR, Estimated: >60 mL/min (ref >60)
Glucose, Bld: 109 mg/dL — ABNORMAL HIGH (ref 70–99)
Potassium: 3.7 mmol/L (ref 3.5–5.1)
Sodium: 138 mmol/L (ref 135–145)
Total Bilirubin: 0.9 mg/dL (ref 0.3–1.2)
Total Protein: 6.6 g/dL (ref 6.5–8.1)

## 2022-05-17 LAB — ECHOCARDIOGRAM COMPLETE
Area-P 1/2: 4.54 cm2
Calc EF: 41.3 %
Height: 67 in
S' Lateral: 3.7 cm
Single Plane A2C EF: 43.9 %
Single Plane A4C EF: 39.8 %
Weight: 3714.31 oz

## 2022-05-17 LAB — MRSA NEXT GEN BY PCR, NASAL: MRSA by PCR Next Gen: NOT DETECTED

## 2022-05-17 LAB — CBG MONITORING, ED
Glucose-Capillary: 100 mg/dL — ABNORMAL HIGH (ref 70–99)
Glucose-Capillary: 198 mg/dL — ABNORMAL HIGH (ref 70–99)
Glucose-Capillary: 161 mg/dL — ABNORMAL HIGH (ref 70–99)

## 2022-05-17 LAB — TROPONIN I (HIGH SENSITIVITY): Troponin I (High Sensitivity): 11 ng/L (ref <18)

## 2022-05-17 MED ORDER — ATORVASTATIN CALCIUM 40 MG PO TABS
40.0000 mg | ORAL_TABLET | Freq: Every day | ORAL | Status: DC
  Administered 2022-05-17: 40 mg via ORAL
  Filled 2022-05-17: qty 1

## 2022-05-17 MED ORDER — INSULIN ASPART 100 UNIT/ML IJ SOLN
0.0000 [IU] | Freq: Three times a day (TID) | INTRAMUSCULAR | Status: DC
  Administered 2022-05-17: 4 [IU] via SUBCUTANEOUS
  Administered 2022-05-18: 2 [IU] via SUBCUTANEOUS
  Filled 2022-05-17: qty 0.24

## 2022-05-17 MED ORDER — LISINOPRIL 20 MG PO TABS
20.0000 mg | ORAL_TABLET | Freq: Every day | ORAL | Status: DC
  Administered 2022-05-17 – 2022-05-18 (×2): 20 mg via ORAL
  Filled 2022-05-17: qty 2
  Filled 2022-05-17: qty 1

## 2022-05-17 MED ORDER — CEPHALEXIN 500 MG PO CAPS
500.0000 mg | ORAL_CAPSULE | Freq: Three times a day (TID) | ORAL | Status: DC
  Administered 2022-05-17 (×2): 500 mg via ORAL
  Filled 2022-05-17 (×2): qty 1

## 2022-05-17 MED ORDER — ESCITALOPRAM OXALATE 10 MG PO TABS
10.0000 mg | ORAL_TABLET | Freq: Every day | ORAL | Status: DC
  Administered 2022-05-17 – 2022-05-18 (×2): 10 mg via ORAL
  Filled 2022-05-17 (×2): qty 1

## 2022-05-17 MED ORDER — ACETAMINOPHEN 650 MG RE SUPP: 650.0000 mg | Freq: Four times a day (QID) | RECTAL | Status: DC | PRN

## 2022-05-17 MED ORDER — CEPHALEXIN 500 MG PO CAPS
500.0000 mg | ORAL_CAPSULE | Freq: Four times a day (QID) | ORAL | Status: DC
  Administered 2022-05-17 – 2022-05-18 (×4): 500 mg via ORAL
  Filled 2022-05-17 (×4): qty 1

## 2022-05-17 MED ORDER — APIXABAN 5 MG PO TABS
5.0000 mg | ORAL_TABLET | Freq: Two times a day (BID) | ORAL | Status: DC
  Administered 2022-05-17 – 2022-05-18 (×4): 5 mg via ORAL
  Filled 2022-05-17 (×4): qty 1

## 2022-05-17 MED ORDER — METOPROLOL TARTRATE 25 MG PO TABS
25.0000 mg | ORAL_TABLET | Freq: Two times a day (BID) | ORAL | Status: DC
  Administered 2022-05-17 – 2022-05-18 (×4): 25 mg via ORAL
  Filled 2022-05-17 (×4): qty 1

## 2022-05-17 MED ORDER — POLYETHYLENE GLYCOL 3350 17 G PO PACK: 17.0000 g | PACK | Freq: Every day | ORAL | Status: DC | PRN

## 2022-05-17 MED ORDER — ACETAMINOPHEN 325 MG PO TABS: 650.0000 mg | ORAL_TABLET | Freq: Four times a day (QID) | ORAL | Status: DC | PRN

## 2022-05-17 MED ORDER — OXYCODONE HCL 5 MG PO TABS: 5.0000 mg | ORAL_TABLET | ORAL | Status: DC | PRN

## 2022-05-17 MED ORDER — INSULIN DETEMIR 100 UNIT/ML ~~LOC~~ SOLN
20.0000 [IU] | Freq: Every day | SUBCUTANEOUS | Status: DC
  Administered 2022-05-17 – 2022-05-18 (×2): 20 [IU] via SUBCUTANEOUS
  Filled 2022-05-17 (×2): qty 0.2

## 2022-05-17 MED ORDER — PERFLUTREN LIPID MICROSPHERE
1.0000 mL | INTRAVENOUS | Status: AC | PRN
  Administered 2022-05-17: 3 mL via INTRAVENOUS

## 2022-05-17 NOTE — H&P (Signed)
History and Physical    Mark Chang [id number] DOB: 01/22/1946 DOA: 05/16/2022  PCP: Terrill Mohr, NP   Chief Complaint: ostomy pain  HPI: Mark Chang is a 77 y.o. male with medical history significant of colon cancer status post left colectomy with end colostomy, diabetes, hypertension who presented to the emergency department due to need for wound check of his ostomy.  He was found to have surrounding erythema with concern for cellulitis and was given doxycycline.  Also found to be in A-fib with RVR.  He was started on Cardizem drip.  Labs were obtained which showed WBC 9.6, hemoglobin 13.9, platelets 202 sodium 133, glucose 106, TSH 1.1.  Patient underwent chest x-ray which showed no acute cardiopulmonary process.  On admission he was in persistent A-fib with RVR with rates between 110 and 120.  He was started on metoprolol on his diltiazem drip denied having any symptoms of tachycardia.  His last echocardiogram was completed in 2022 which showed slightly reduced EF 45%   Review of Systems: Review of Systems  All other systems reviewed and are negative.    As per HPI otherwise 10 point review of systems negative.   No Known Allergies  Past Medical History:  Diagnosis Date   CKD (chronic kidney disease), stage III ([place]) 03/24/2017   COPD (chronic obstructive pulmonary disease) (HCC)    DM (diabetes mellitus), type 2 with renal complications ([place]) 123XX123   Hyperlipidemia    Hypertension    Mental retardation    Obesity, Class III, BMI 40-49.9 (morbid obesity) ([place]) 03/24/2017   Pernicious anemia     Past Surgical History:  Procedure Laterality Date   COLON RESECTION SIGMOID N/A 10/24/2020   Procedure: EXPLORATORY LAPAROTOMY, RIGHT COLECTOMY WITH ANASTOMOSIS. LEFT COLECTOMY WITH COLOSTOMY, MOBILIZATION OF SPLENIC FLEXURE;  Surgeon: Stechschulte, Nickola Major, MD;  Location: [facility];  Service: General;  Laterality: N/A;     reports that he has never smoked. He has never  used smokeless tobacco. He reports that he does not drink alcohol. No history on file for drug use.  Family History  Family history unknown: Yes    Prior to Admission medications   Medication Sig Start Date End Date Taking? Authorizing Provider  acetaminophen (TYLENOL) 500 MG tablet Take 500 mg by mouth 3 (three) times daily.    [provider]  atorvastatin (LIPITOR) 40 MG tablet Take 40 mg by mouth at bedtime. 07/03/19   [provider]  carboxymethylcellulose (REFRESH PLUS) 0.5 % SOLN Place 1 drop into both eyes 2 (two) times daily.    [provider]  dextrose (GLUTOSE) 40 % GEL Take 1 Tube by mouth once as needed for low blood sugar. Patient not taking: Reported on 12/16/2021    [provider]  escitalopram (LEXAPRO) 10 MG tablet Take 10 mg by mouth daily.    [provider]  famotidine (PEPCID) 20 MG tablet Take 20 mg by mouth 2 (two) times daily. 06/12/19   [provider]  feeding supplement (ENSURE ENLIVE / ENSURE PLUS) LIQD Take 237 mLs by mouth 3 (three) times daily between meals. 11/06/20   Antonieta Pert, MD  gabapentin (NEURONTIN) 100 MG capsule Take 100 mg by mouth 2 (two) times daily.  07/03/19   [provider]  insulin aspart (NOVOLOG FLEXPEN) 100 UNIT/ML FlexPen Inject 5 Units into the skin 3 (three) times daily with meals. 11/06/20   Antonieta Pert, MD  insulin aspart (NOVOLOG) 100 UNIT/ML injection CBG 70 - 120: 0  units CBG 121 - 150: 2 units CBG 151 - 200: 3 units CBG 201 - 250: 5 units CBG 251 - 300: 8 units CBG 301 - 350: 11 units CBG 351 - 400: 15 units 07/23/16   Hosie Poisson, MD  Insulin Glargine (BASAGLAR KWIKPEN) 100 UNIT/ML Inject 40 Units into the skin at bedtime. 11/06/20   Antonieta Pert, MD  lisinopril (ZESTRIL) 20 MG tablet Take 1 tablet (20 mg total) by mouth daily. 11/07/20   Antonieta Pert, MD  loperamide (IMODIUM) 2 MG capsule Take 1 capsule (2 mg total) by mouth 3 (three) times daily. 11/06/20   Antonieta Pert, MD   Melatonin 5 MG SUBL Take 10 mg by mouth at bedtime.    [provider]  Multiple Vitamin (MULTIVITAMIN WITH MINERALS) TABS tablet Take 1 tablet by mouth daily. 11/07/20   Antonieta Pert, MD  Nutritional Supplements (,FEEDING SUPPLEMENT, PROSOURCE PLUS) liquid Take 30 mLs by mouth 2 (two) times daily between meals. 11/06/20   Antonieta Pert, MD  polycarbophil (FIBERCON) 625 MG tablet Take 1 tablet (625 mg total) by mouth 2 (two) times daily. 11/06/20   Antonieta Pert, MD  TRULICITY 1.5 0000000 SOPN Inject 1.5 mg into the skin every Thursday. 06/13/19   [provider]  vitamin B-12 (CYANOCOBALAMIN) 1000 MCG tablet Take 1,000 mcg by mouth daily.    [provider]    Physical Exam: Vitals:   05/16/22 2245 05/16/22 2300 05/16/22 2315 05/16/22 2330  BP: (!) 158/101 (!) 159/127 112/82 (!) 140/68  Pulse: 75 (!) 46 (!) 53 60  Resp: 14 13 15 12  $ Temp:    97.6 F (36.4 C)  TempSrc:      SpO2: 100% 100% 100% 100%  Weight:      Height:       Physical Exam Constitutional:      Appearance: He is normal weight.  HENT:     Nose: Nose normal.     Mouth/Throat:     Mouth: Mucous membranes are moist.     Pharynx: Oropharynx is clear.  Eyes:     Conjunctiva/sclera: Conjunctivae normal.     Pupils: Pupils are equal, round, and reactive to light.  Cardiovascular:     Rate and Rhythm: Tachycardia present. Rhythm irregular.     Pulses: Normal pulses.     Heart sounds: Normal heart sounds.  Pulmonary:     Effort: Pulmonary effort is normal.     Breath sounds: Normal breath sounds.  Abdominal:     General: Abdomen is flat. Bowel sounds are normal.  Musculoskeletal:        General: Normal range of motion.     Cervical back: Normal range of motion.  Skin:    General: Skin is warm.     Capillary Refill: Capillary refill takes less than 2 seconds.  Neurological:     General: No focal deficit present.     Mental Status: He is alert. Mental status is at baseline.  Psychiatric:         Mood and Affect: Mood normal.      Labs on Admission: I have personally reviewed the patients's labs and imaging studies.  Assessment/Plan Principal Problem:   Atrial fibrillation with RVR ([place])   # Atrial fibrillation with RVR - Patient denies prior history of A-fib. - Currently not on anticoagulation - Hemodynamically stable Plan: Obtain echocardiogram Continue diltiazem drip Start metoprolol 25 mg twice daily and uptitrate as needed Transition heparin drip to Eliquis Patient will likely need  cardiology follow-up for consideration of cardioversion   hyponatremia-status post IV fluids  # Colon cancer status post colostomy -Ostomy irritation/cellulitis - Colostomy performed 09/2020 plan: Placed on p.o. Keflex  Type 2 diabetes-placed on basal bolus regimen with 20 mg of Levemir and sliding scale  Hypertension-continue lisinopril  Depression-continue Lexapro  Hyperlipidemia-continue Lipitor  Admission status: Observation Telemetry  Certification: The appropriate patient status for this patient is OBSERVATION. Observation status is judged to be reasonable and necessary in order to provide the required intensity of service to ensure the patient's safety. The patient's presenting symptoms, physical exam findings, and initial radiographic and laboratory data in the context of their medical condition is felt to place them at decreased risk for further clinical deterioration. Furthermore, it is anticipated that the patient will be medically stable for discharge from the hospital within 2 midnights of admission.     Emilee Hero MD [facility] If 7PM-7AM, please contact night-coverage [email or web address]  05/17/2022, 12:02 AM

## 2022-05-17 NOTE — ED Notes (Signed)
Cardizem stopped per MD 

## 2022-05-17 NOTE — Progress Notes (Signed)
Echocardiogram 2D Echocardiogram has been performed.  Mark Chang 05/17/2022, 11:27 AM

## 2022-05-17 NOTE — ED Notes (Signed)
Dr. Gwynne Edinger, and Dr. Annie Paras notified that the patient is having PVC.  New EKG obtained, and CMP sent to lab.

## 2022-05-17 NOTE — Progress Notes (Signed)
   Patient seen and examined at bedside, patient admitted after midnight, please see earlier detailed admission note by Emilee Hero, MD. Briefly, patient presented secondary to ostomy pain and was found to have cellulitis of skin around ostomy and atrial fibrillation with RVR. Cardizem IV and metoprolol PO started with improvement of rate and reversion back to sinus rhythm. Keflex started for cellulitis.  Subjective: Patient reports no pain around his ostomy. Feels well. Wants to go back to his facility.  BP (!) 130/57 (BP Location: Right Arm)   Pulse 82   Temp 98 F (36.7 C) (Oral)   Resp 18   Ht 5' 7"$  (1.702 m)   Wt 105.3 kg   SpO2 100%   BMI 36.36 kg/m   General exam: Appears calm and comfortable Respiratory system: Clear to auscultation. Respiratory effort normal. Cardiovascular system: S1 & S2 heard, RRR.  Gastrointestinal system: Abdomen is nondistended, soft and nontender. Normal bowel sounds heard. Central nervous system: Alert and oriented. No focal neurological deficits. Musculoskeletal: No edema. No calf tenderness Skin: No cyanosis. No rashes/erythema around ostomy site Psychiatry: Judgement and insight appear normal. Mood & affect appropriate.   Brief assessment/Plan:  Atrial fibrillation with RVR Noted on admission. Patient started on diltiazem drip and metoprolol PO with improved rate controlled and eventual reversion back to sinus rhythm. Diltiazem weaned off. -Continue metoprolol -Follow-up Transthoracic Echocardiogram results  Cellulitis Noted around ostomy. Started on Keflex. Appears resolved. -Continue Keflex  Colon cancer Diabetes mellitus type 2 Primary hypertension Depression Hyperlipidemia Per H&P  Family communication: Brother on telephone DVT prophylaxis: Heparin IV Disposition: Discharge back to facility likely in 1 day pending stable heart rate and follow-up on Transthoracic Echocardiogram results  Cordelia Poche, MD [facility] 05/17/2022, 12:08 PM

## 2022-05-17 NOTE — Plan of Care (Signed)
  Problem: Clinical Measurements: Goal: Diagnostic test results will improve Outcome: Progressing   Problem: Nutrition: Goal: Adequate nutrition will be maintained Outcome: Progressing   Problem: Safety: Goal: Ability to remain free from injury will improve Outcome: Progressing

## 2022-05-17 NOTE — Progress Notes (Signed)
   05/17/22 1428  Vitals  Temp 98 F (36.7 C)  Temp Source Oral  BP 115/62  MAP (mmHg) 79  BP Location Right Arm  BP Method Automatic  Pulse Rate 95  Pulse Rate Source Dinamap  ECG Heart Rate 84  Resp 18  Level of Consciousness  Level of Consciousness Alert  MEWS COLOR  MEWS Score Color Green  Oxygen Therapy  SpO2 100 %  O2 Device Room Air  Pain Assessment  Pain Scale 0-10  Pain Score 0  MEWS Score  MEWS Temp 0  MEWS Systolic 0  MEWS Pulse 0  MEWS RR 0  MEWS LOC 0  MEWS Score 0   Pt had 3 beats V tach. Asymptomatic. MD notified. Will continue to monitor.

## 2022-05-18 ENCOUNTER — Other Ambulatory Visit (HOSPITAL_COMMUNITY): Payer: Self-pay

## 2022-05-18 DIAGNOSIS — I5022 Chronic systolic (congestive) heart failure: Secondary | ICD-10-CM | POA: Insufficient documentation

## 2022-05-18 DIAGNOSIS — I4891 Unspecified atrial fibrillation: Secondary | ICD-10-CM | POA: Diagnosis not present

## 2022-05-18 DIAGNOSIS — I480 Paroxysmal atrial fibrillation: Secondary | ICD-10-CM | POA: Diagnosis not present

## 2022-05-18 DIAGNOSIS — I5043 Acute on chronic combined systolic (congestive) and diastolic (congestive) heart failure: Secondary | ICD-10-CM | POA: Insufficient documentation

## 2022-05-18 DIAGNOSIS — L0390 Cellulitis, unspecified: Secondary | ICD-10-CM | POA: Insufficient documentation

## 2022-05-18 LAB — GLUCOSE, CAPILLARY
Glucose-Capillary: 124 mg/dL — ABNORMAL HIGH (ref 70–99)
Glucose-Capillary: 96 mg/dL (ref 70–99)

## 2022-05-18 MED ORDER — CEFADROXIL 500 MG PO CAPS: 500.0000 mg | ORAL_CAPSULE | Freq: Two times a day (BID) | ORAL | 0 refills | Status: AC

## 2022-05-18 MED ORDER — APIXABAN 5 MG PO TABS: 5.0000 mg | ORAL_TABLET | Freq: Two times a day (BID) | ORAL | 2 refills | Status: DC

## 2022-05-18 MED ORDER — CEFADROXIL 500 MG PO CAPS: 500.0000 mg | ORAL_CAPSULE | Freq: Two times a day (BID) | ORAL | 0 refills | Status: DC

## 2022-05-18 MED ORDER — METOPROLOL SUCCINATE ER 50 MG PO TB24: 50.0000 mg | ORAL_TABLET | Freq: Every day | ORAL | 2 refills | Status: DC

## 2022-05-18 MED ORDER — METOPROLOL TARTRATE 25 MG PO TABS: 25.0000 mg | ORAL_TABLET | Freq: Two times a day (BID) | ORAL | 2 refills | Status: DC

## 2022-05-18 NOTE — NC FL2 (Signed)
[place] MEDICAID FL2 LEVEL OF CARE FORM     IDENTIFICATION  Patient Name: Mark Chang Birthdate: Aug 16, 1945 Sex: male Admission Date (Current Location): 05/16/2022  [facility] and [place] Number:  Herbalist and Address:  [facility],  [place], [place]      Provider Number: [id number]  Attending Physician Name and Address:  Mariel Aloe, MD  Relative Name and Phone Number:   [name] Sidle(brother))    Current Level of Care: Hospital Recommended Level of Care: [place] Prior Approval Number:    Date Approved/Denied:   PASRR Number:    Discharge Plan: Other (Comment) (ALF)    Current Diagnoses: Patient Active Problem List   Diagnosis Date Noted   Cellulitis 05/18/2022   Cancer of sigmoid colon ([place]) 11/14/2020   Diarrhea 10/31/2020   Mass of cecum 10/20/2020   Pneumatosis coli 10/20/2020   Sepsis due to Escherichia coli (E. coli) ([place]) 03/24/2017   E. coli UTI 03/24/2017   Obesity, Class III, BMI 40-49.9 (morbid obesity) ([place]) 03/24/2017   DM (diabetes mellitus), type 2 with renal complications ([place]) 123XX123   CKD (chronic kidney disease), stage III ([place]) 03/24/2017   Intellectual disability 03/20/2017   Hypertension 03/20/2017   Pernicious anemia 03/20/2017    Orientation RESPIRATION BLADDER Height & Weight     Self, Time, Situation, Place  Normal Incontinent Weight: 105.3 kg Height:  5' 7"$  (170.2 cm)  BEHAVIORAL SYMPTOMS/MOOD NEUROLOGICAL BOWEL NUTRITION STATUS      Colostomy Diet (Regular)  AMBULATORY STATUS COMMUNICATION OF NEEDS Skin   Limited Assist Verbally Normal                       Personal Care Assistance Level of Assistance  Bathing, Feeding, Dressing Bathing Assistance: Limited assistance Feeding assistance: Independent Dressing Assistance: Limited assistance     Functional Limitations Info  Sight, Hearing, Speech Sight Info: Adequate Hearing Info: Adequate Speech Info:  Adequate    SPECIAL CARE FACTORS FREQUENCY                       Contractures Contractures Info: Not present    Additional Factors Info  Code Status Code Status Info:  (Full)             Current Medications (05/18/2022):  This is the current hospital active medication list Current Facility-Administered Medications  Medication Dose Route Frequency Provider Last Rate Last Admin   acetaminophen (TYLENOL) tablet 650 mg  650 mg Oral Q6H PRN Dorrell, Robert, MD       Or   acetaminophen (TYLENOL) suppository 650 mg  650 mg Rectal Q6H PRN Dorrell, Robert, MD       apixaban Arne Cleveland) tablet 5 mg  5 mg Oral BID Emilee Hero, MD   5 mg at 05/17/22 2043   atorvastatin (LIPITOR) tablet 40 mg  40 mg Oral Laury Deep, MD   40 mg at 05/17/22 2042   cephALEXin (KEFLEX) capsule 500 mg  500 mg Oral Q6H Mariel Aloe, MD   500 mg at 05/18/22 0502   escitalopram (LEXAPRO) tablet 10 mg  10 mg Oral Daily Dorrell, Herbie [place], MD   10 mg at 05/17/22 0945   insulin aspart (novoLOG) injection 0-24 Units  0-24 Units Subcutaneous TID WC Emilee Hero, MD   4 Units at 05/17/22 1215   insulin detemir (LEVEMIR) injection 20 Units  20 Units Subcutaneous Daily Emilee Hero, MD  20 Units at 05/17/22 1059   lisinopril (ZESTRIL) tablet 20 mg  20 mg Oral Daily Emilee Hero, MD   20 mg at 05/17/22 0944   metoprolol tartrate (LOPRESSOR) tablet 25 mg  25 mg Oral BID Emilee Hero, MD   25 mg at 05/17/22 2043   oxyCODONE (Oxy IR/ROXICODONE) immediate release tablet 5 mg  5 mg Oral Q4H PRN Emilee Hero, MD       polyethylene glycol (MIRALAX / GLYCOLAX) packet 17 g  17 g Oral Daily PRN Emilee Hero, MD         Discharge Medications: Please see discharge summary for a list of discharge medications.  Relevant Imaging Results:  Relevant Lab Results:   Additional Information SSN: [id number]  Giles Currie, Juliann Pulse, RN

## 2022-05-18 NOTE — TOC Transition Note (Addendum)
Transition of Care [facility]) - CM/SW Discharge Note   Patient Details  Name: Mark Chang MRN: [id number] Date of Birth: Nov 28, 1945  Transition of Care [facility]) CM/SW Contact:  Dessa Phi, RN Phone Number: 05/18/2022, 11:06 AM   Clinical Narrative:will send d/c summary for d/c back to [facility].Rep Wells Guiles to provide own ostomy bags once @ facility;rep explained they ran out & had to re order. MD updated.PTAR  -12:11p- PTAR called. going to [facility] rm#[id number],report #[id number].No further CM needs.    Final next level of care: Assisted Living Barriers to Discharge: No Barriers Identified   Patient Goals and CMS Choice CMS Medicare.gov Compare Post Acute Care list provided to:: Patient Choice offered to / list presented to : Patient  Discharge Placement                Patient chooses bed at: Other - please specify in the comment section below: [employer] [facility])   Name of family member notified:  Mark Chang(brother)) Patient and family notified of of transfer: 05/18/22  Discharge Plan and Services Additional resources added to the After Visit Summary for                                       Social Determinants of Health (SDOH) Interventions SDOH Screenings   Food Insecurity: No Food Insecurity (05/17/2022)  Housing: Low Risk  (05/17/2022)  Transportation Needs: No Transportation Needs (05/17/2022)  Utilities: Not At Risk (05/17/2022)  Tobacco Use: Low Risk  (05/17/2022)     Readmission Risk Interventions     No data to display

## 2022-05-18 NOTE — TOC Progression Note (Signed)
Transition of Care [facility]) - Progression Note    Patient Details  Name: Mark Chang MRN: [id number] Date of Birth: [birth year]  [facility]) CM/SW Contact  Tabitha Riggins, Juliann Pulse, RN Phone Number: 05/18/2022, 4:14 PM  Clinical Narrative: Received call from Hyydia @ [facility] about foley cath-spoke to charge nurse-condom cath-called Hydia informed of condom cath. No further CM needs.      Expected Discharge Plan: Assisted Living Barriers to Discharge: No Barriers Identified  Expected Discharge Plan and Services         Expected Discharge Date: 05/18/22                                     Social Determinants of Health (SDOH) Interventions SDOH Screenings   Food Insecurity: No Food Insecurity (05/17/2022)  Housing: Low Risk  (05/17/2022)  Transportation Needs: No Transportation Needs (05/17/2022)  Utilities: Not At Risk (05/17/2022)  Tobacco Use: Low Risk  (05/17/2022)    Readmission Risk Interventions     No data to display

## 2022-05-18 NOTE — Discharge Summary (Signed)
Physician Discharge Summary   Patient: Mark Chang MRN: [id number] DOB: 1945/07/09  Admit date:     05/16/2022  Discharge date: 05/18/22  Discharge Physician: Cordelia Poche   PCP: Terrill Mohr, NP   Recommendations at discharge:  Follow-up with cardiology regarding atrial fibrillation and heart failure Follow-up with PCP for after hospital visit  Discharge Diagnoses: Active Problems:   Intellectual disability   Hypertension   DM (diabetes mellitus), type 2 with renal complications (HCC)   Cancer of sigmoid colon (HCC)   Cellulitis   Acute on chronic combined systolic and diastolic CHF (congestive heart failure) ([facility])  Principal Problem (Resolved):   Atrial fibrillation with RVR [facility])  Hospital Course: Mark Chang is a 77 y.o. male with a history of colon cancer s/p left colectomy and end colostomy, diabetes mellitus, hypertension. Patient presented secondary to pain around ostomy with concern for cellulitis. He was also found to have evidence of atrial fibrillation with RVR. Atrial fibrillation was managed with diltiazem drip and metoprolol. Diltiazem drip weaned off and patient reverted to sinus rhythm. Transthoracic Echocardiogram significant for evidence of LVEF dysfunction. Patient started on Eliquis, continued on metoprolol and referred to cardiology for follow-up.  Assessment and Plan:  Paroxysmal atrial fibrillation with RVR Noted on admission. Patient started on diltiazem drip and metoprolol PO with improved rate controlled and eventual reversion back to sinus rhythm. Diltiazem weaned off. Transthoracic Echocardiogram significant for decreased LVEF of 40-45% with grade 1 diastolic dysfunction and normal right ventricular function. Associated LV hypokinesis and concentric ventricular hypertrophy. Atria are normal in size. Will discharge on metoprolol succinate and patient to continue lisinopril. Outpatient cardiology follow-up.   Cellulitis Noted around ostomy. Started  on Keflex. Appears resolved. Transition to cefadroxil on discharge.  Chronic combined systolic and diastolic heart failure Noted on Transthoracic Echocardiogram. Patient is already on lisinopril. Started on metoprolol this admission. Discharge on metoprolol succinate 50 mg daily. Outpatient cardiology follow-up.  Colon cancer Diabetes mellitus type 2 Primary hypertension Depression Hyperlipidemia No changes made to chronic medication regimen.   Consultants: None Procedures performed: Transthoracic Echocardiogram   Disposition: Assisted living Diet recommendation: Cardiac diet   DISCHARGE MEDICATION: Allergies as of 05/18/2022   No Known Allergies      Medication List     TAKE these medications    acetaminophen 500 MG tablet Commonly known as: TYLENOL Take 500 mg by mouth 3 (three) times daily.   apixaban 5 MG Tabs tablet Commonly known as: ELIQUIS Take 1 tablet (5 mg total) by mouth 2 (two) times daily.   atorvastatin 40 MG tablet Commonly known as: LIPITOR Take 40 mg by mouth at bedtime.   Basaglar KwikPen 100 UNIT/ML Inject 40 Units into the skin at bedtime.   carboxymethylcellulose 0.5 % Soln Commonly known as: REFRESH PLUS Place 1 drop into both eyes 2 (two) times daily.   cefadroxil 500 MG capsule Commonly known as: DURICEF Take 1 capsule (500 mg total) by mouth 2 (two) times daily for 6 days.   cyanocobalamin 1000 MCG tablet Commonly known as: VITAMIN B12 Take 1,000 mcg by mouth daily.   dextrose 40 % Gel Commonly known as: GLUTOSE Take 1 Tube by mouth once as needed for low blood sugar.   escitalopram 10 MG tablet Commonly known as: LEXAPRO Take 10 mg by mouth daily.   famotidine 20 MG tablet Commonly known as: PEPCID Take 20 mg by mouth 2 (two) times daily.   feeding supplement Liqd Take 237 mLs by mouth 3 (three)  times daily between meals.   (feeding supplement) PROSource Plus liquid Take 30 mLs by mouth 2 (two) times daily between  meals.   gabapentin 100 MG capsule Commonly known as: NEURONTIN Take 100 mg by mouth 2 (two) times daily.   guaifenesin 100 MG/5ML syrup Commonly known as: ROBITUSSIN Take 200 mg by mouth 3 (three) times daily as needed for cough.   insulin aspart 100 UNIT/ML injection Commonly known as: novoLOG CBG 70 - 120: 0 units CBG 121 - 150: 2 units CBG 151 - 200: 3 units CBG 201 - 250: 5 units CBG 251 - 300: 8 units CBG 301 - 350: 11 units CBG 351 - 400: 15 units   NovoLOG FlexPen 100 UNIT/ML FlexPen Generic drug: insulin aspart Inject 5 Units into the skin 3 (three) times daily with meals.   lisinopril 20 MG tablet Commonly known as: ZESTRIL Take 1 tablet (20 mg total) by mouth daily.   loperamide 2 MG capsule Commonly known as: IMODIUM Take 1 capsule (2 mg total) by mouth 3 (three) times daily. What changed:  when to take this reasons to take this   Melatonin 5 MG Subl Take 3 mg by mouth at bedtime.   metoprolol succinate 50 MG 24 hr tablet Commonly known as: Toprol XL Take 1 tablet (50 mg total) by mouth daily. Take with or immediately following a meal.   multivitamin with minerals Tabs tablet Take 1 tablet by mouth daily.   polycarbophil 625 MG tablet Commonly known as: FIBERCON Take 1 tablet (625 mg total) by mouth 2 (two) times daily.   Trulicity 1.5 0000000 Sopn Generic drug: Dulaglutide Inject 1.5 mg into the skin every Thursday.               Durable Medical Equipment  (From admission, onward)           Start     Ordered   05/18/22 0936  For home use only DME Ostomy supplies  Once        05/18/22 0935            Follow-up Information     Terrill Mohr, NP. Schedule an appointment as soon as possible for a visit in 1 week(s).   Specialty: Nurse Practitioner Why: For hospital follow-up Contact information: [address] [place] [address] [phone]                Discharge Exam: BP (!) 151/89 (BP Location:  Right Arm)   Pulse 79   Temp 97.8 F (36.6 C)   Resp 20   Ht 5' 7"$  (1.702 m)   Wt 105.3 kg   SpO2 100%   BMI 36.36 kg/m   General exam: Appears calm and comfortable Respiratory system: Clear to auscultation. Respiratory effort normal. Cardiovascular system: S1 & S2 heard, RRR. Gastrointestinal system: Abdomen is soft and nontender. Normal bowel sounds heard. Central nervous system: Alert and oriented. No focal neurological deficits. Musculoskeletal: No edema. No calf tenderness Psychiatry: Judgement and insight appear normal. Mood & affect appropriate.    Condition at discharge: stable  The results of significant diagnostics from this hospitalization (including imaging, microbiology, ancillary and laboratory) are listed below for reference.   Imaging Studies: ECHOCARDIOGRAM COMPLETE  Result Date: 05/17/2022    ECHOCARDIOGRAM REPORT   Patient Name:   SAMIUELA WURTZEL Date of Exam: 05/17/2022 Medical Rec #:  [id number]     Height:       67.0 in Accession #:    [id number]  Weight:       232.1 lb Date of Birth:  02-Jun-1945     BSA:          2.154 m Patient Age:    23 years      BP:           130/62 mmHg Patient Gender: M             HR:           90 bpm. Exam Location:  Inpatient Procedure: 2D Echo, Cardiac Doppler and Color Doppler Indications:    I48.91* Unspeicified atrial fibrillation  History:        Patient has prior history of Echocardiogram examinations. COPD;                 Risk Factors:Hypertension, Diabetes and Dyslipidemia.  Sonographer:    Phineas Douglas Referring Phys: [id number] [place]  1. Technically difficult; LV function appears to mild to moderately reduced.  2. Left ventricular ejection fraction, by estimation, is 40 to 45%. The left ventricle has mild to moderately decreased function. The left ventricle demonstrates global hypokinesis. There is moderate concentric left ventricular hypertrophy. Left ventricular diastolic parameters are consistent with Grade I  diastolic dysfunction (impaired relaxation). Elevated left atrial pressure.  3. Right ventricular systolic function is normal. The right ventricular size is normal.  4. The mitral valve is normal in structure. No evidence of mitral valve regurgitation. No evidence of mitral stenosis.  5. The aortic valve is tricuspid. Aortic valve regurgitation is not visualized. Aortic valve sclerosis is present, with no evidence of aortic valve stenosis.  6. The inferior vena cava is normal in size with greater than 50% respiratory variability, suggesting right atrial pressure of 3 mmHg. FINDINGS  Left Ventricle: Left ventricular ejection fraction, by estimation, is 40 to 45%. The left ventricle has mild to moderately decreased function. The left ventricle demonstrates global hypokinesis. Definity contrast agent was given IV to delineate the left  ventricular endocardial borders. The left ventricular internal cavity size was normal in size. There is moderate concentric left ventricular hypertrophy. Left ventricular diastolic parameters are consistent with Grade I diastolic dysfunction (impaired relaxation). Elevated left atrial pressure. Right Ventricle: The right ventricular size is normal. Right ventricular systolic function is normal. Left Atrium: Left atrial size was normal in size. Right Atrium: Right atrial size was normal in size. Pericardium: There is no evidence of pericardial effusion. Mitral Valve: The mitral valve is normal in structure. No evidence of mitral valve regurgitation. No evidence of mitral valve stenosis. Tricuspid Valve: The tricuspid valve is normal in structure. Tricuspid valve regurgitation is trivial. No evidence of tricuspid stenosis. Aortic Valve: The aortic valve is tricuspid. Aortic valve regurgitation is not visualized. Aortic valve sclerosis is present, with no evidence of aortic valve stenosis. Pulmonic Valve: The pulmonic valve was not well visualized. Pulmonic valve regurgitation is not  visualized. No evidence of pulmonic stenosis. Aorta: The aortic root is normal in size and structure. Venous: The inferior vena cava is normal in size with greater than 50% respiratory variability, suggesting right atrial pressure of 3 mmHg. IAS/Shunts: The interatrial septum was not well visualized. Additional Comments: Technically difficult; LV function appears to mild to moderately reduced.  LEFT VENTRICLE PLAX 2D LVIDd:         4.80 cm      Diastology LVIDs:         3.70 cm      LV e' medial:    4.13  cm/s LV PW:         1.40 cm      LV E/e' medial:  20.9 LV IVS:        1.30 cm      LV e' lateral:   7.51 cm/s LVOT diam:     2.20 cm      LV E/e' lateral: 11.5 LV SV:         71 LV SV Index:   33 LVOT Area:     3.80 cm  LV Volumes (MOD) LV vol d, MOD A2C: 177.0 ml LV vol d, MOD A4C: 196.0 ml LV vol s, MOD A2C: 99.3 ml LV vol s, MOD A4C: 118.0 ml LV SV MOD A2C:     77.7 ml LV SV MOD A4C:     196.0 ml LV SV MOD BP:      78.6 ml RIGHT VENTRICLE             IVC RV Basal diam:  3.00 cm     IVC diam: 2.10 cm RV S prime:     11.50 cm/s TAPSE (M-mode): 2.0 cm LEFT ATRIUM             Index        RIGHT ATRIUM           Index LA diam:        4.20 cm 1.95 cm/m   RA Area:     12.10 cm LA Vol (A2C):   53.7 ml 24.92 ml/m  RA Volume:   25.20 ml  11.70 ml/m LA Vol (A4C):   53.9 ml 25.02 ml/m LA Biplane Vol: 58.0 ml 26.92 ml/m  AORTIC VALVE LVOT Vmax:   92.50 cm/s LVOT Vmean:  63.700 cm/s LVOT VTI:    0.187 m  AORTA Ao Root diam: 3.40 cm Ao Asc diam:  3.20 cm MITRAL VALVE MV Area (PHT): 4.54 cm     SHUNTS MV Decel Time: 167 msec     Systemic VTI:  0.19 m MV E velocity: 86.40 cm/s   Systemic Diam: 2.20 cm MV A velocity: 111.00 cm/s MV E/A ratio:  0.78 Kirk Ruths MD Electronically signed by Kirk Ruths MD Signature Date/Time: 05/17/2022/11:34:44 AM    Final    DG Chest Portable 1 View  Result Date: 05/16/2022 CLINICAL DATA:  Atrial fibrillation, COPD, diabetes EXAM: PORTABLE CHEST 1 VIEW COMPARISON:  10/29/2020  FINDINGS: Single frontal view of the chest demonstrates a stable cardiac silhouette. Lung volumes are diminished, without airspace disease, effusion, or pneumothorax. Chronic elevation right hemidiaphragm. No acute bony abnormalities. IMPRESSION: 1. Low lung volumes.  No acute process. Electronically Signed   By: Randa Ngo M.D.   On: 05/16/2022 22:26   CT CHEST ABDOMEN PELVIS W CONTRAST  Result Date: 04/27/2022 CLINICAL DATA:  Colon cancer restaging, CEA elevation * Tracking Code: BO * EXAM: CT CHEST, ABDOMEN, AND PELVIS WITH CONTRAST TECHNIQUE: Multidetector CT imaging of the chest, abdomen and pelvis was performed following the standard protocol during bolus administration of intravenous contrast. RADIATION DOSE REDUCTION: This exam was performed according to the departmental dose-optimization program which includes automated exposure control, adjustment of the mA and/or kV according to patient size and/or use of iterative reconstruction technique. CONTRAST:  38m OMNIPAQUE IOHEXOL 350 MG/ML SOLN COMPARISON:  CT chest, 01/27/2022, CT abdomen pelvis, 10/30/2020 FINDINGS: CT CHEST FINDINGS Cardiovascular: Aortic atherosclerosis. Normal heart size. Three-vessel coronary artery calcifications. No pericardial effusion. Mediastinum/Nodes: No enlarged mediastinal, hilar, or axillary lymph nodes. Thyroid gland, trachea,  and esophagus demonstrate no significant findings. Lungs/Pleura: Examination of the lungs is limited by breath motion artifact. Within this limitation, unchanged 0.7 cm nodule of the peripheral right upper lobe (series 4, image 49). No new nodules. No pleural effusion or pneumothorax. Musculoskeletal: No chest wall abnormality. No acute osseous findings. CT ABDOMEN PELVIS FINDINGS Evaluation of the abdomen and pelvis is generally limited by breath motion artifact. Hepatobiliary: New hypodense lesion of the medial liver dome, hepatic segment VIII measuring 1.9 x 1.9 cm (series 2, image 37).  Probable additional very subtle lesion of the posterior liver dome, hepatic segment VII measuring 1.1 x 1.1 cm (series 2, image 43). Contracted gallbladder containing multiple gallstones. No gallbladder wall thickening or biliary ductal dilatation. Pancreas: Unremarkable. No pancreatic ductal dilatation or surrounding inflammatory changes. Spleen: Normal in size without significant abnormality. 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 Multiple simple, benign bilateral renal cortical cysts, for which no further follow-up or characterization is required. Kidneys are otherwise normal, without renal calculi, solid lesion, or hydronephrosis. Bladder is unremarkable. Stomach/Bowel: Stomach is within normal limits. Status post right hemicolectomy and reanastomosis. Additional sigmoid colon resection with left lower quadrant end colostomy. No evidence of bowel wall thickening, distention, or inflammatory changes. Vascular/Lymphatic: No significant vascular findings are present. No enlarged abdominal or pelvic lymph nodes. Nodular soft tissue in the left lower quadrant mesocolon adjacent to the inferior mesenteric artery measuring 2.3 x 1.7 cm (series 2, image 86). Reproductive: No mass or other abnormality. Other: Status post left lower quadrant end colostomy. Small, fat containing parastomal hernia. Fat containing bilateral inguinal hernias. No ascites. Musculoskeletal: No acute osseous findings. IMPRESSION: 1. New hypodense lesion of the medial liver dome, hepatic segment VIII. Probable additional very subtle lesion of the posterior liver dome, hepatic segment VII. 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 2. Nodular soft tissue in the left lower quadrant mesocolon adjacent to the inferior mesenteric artery,  consistent with metastatic disease. 3. Examination of the lungs is limited by breath motion artifact. Within this limitation, unchanged 0.7 cm nodule of the peripheral right upper lobe, most likely benign and incidental given stability over time although a small slowly growing metastasis is not excluded. No new nodules. Attention on follow-up. 4. Status post right hemicolectomy and reanastomosis. Additional sigmoid colon resection with left lower quadrant end colostomy. 5. Cholelithiasis. 6. Coronary artery disease. These results will be called to the ordering clinician or representative by the Radiologist Assistant, and communication documented in the PACS or [employer]. Aortic Atherosclerosis (ICD10-I70.0). Electronically Signed   By: Delanna Ahmadi M.D.   On: 04/27/2022 17:22    Microbiology: Results for orders placed or performed during the hospital encounter of 05/16/22  MRSA Next Gen by PCR, Nasal     Status: None   Collection Time: 05/17/22  2:38 PM   Specimen: Nasal Mucosa; Nasal Swab  Result Value Ref Range Status   MRSA by PCR Next Gen NOT DETECTED NOT DETECTED Final    Comment: (NOTE) The GeneXpert MRSA Assay (FDA approved for NASAL specimens only), is one component of a comprehensive MRSA colonization surveillance program. It is not intended to diagnose MRSA infection nor to guide or monitor treatment for MRSA infections. Test performance is not FDA approved in patients less than 37 years old. Performed at [facility], [address] [place], [place] [address]     Labs: CBC: Recent Labs  Lab 05/16/22 2220 05/17/22 0349  WBC 9.6 10.1  NEUTROABS 5.9  --   HGB 13.9 13.0  HCT 42.7 39.9  MCV 89.9 89.7  PLT 202 [id number]   Basic Metabolic Panel: Recent Labs  Lab 05/16/22 2220 05/17/22 0337  NA 133* 138  K 3.7 3.7  CL 99 105  CO2 25 23  GLUCOSE 106* 109*  BUN 21 21  CREATININE 1.08 1.12  CALCIUM 8.7* 8.7*   Liver Function Tests: Recent Labs   Lab 05/16/22 2220 05/17/22 0337  AST 21 22  ALT 16 17  ALKPHOS 70 62  BILITOT 0.8 0.9  PROT 7.0 6.6  ALBUMIN 3.5 3.1*   CBG: Recent Labs  Lab 05/17/22 1107 05/17/22 1156 05/17/22 1640 05/17/22 2106 05/18/22 0723  GLUCAP 198* 161* 109* 140* 124*    Discharge time spent: 35 minutes.  Signed: Cordelia Poche, MD [facility] 05/18/2022

## 2022-05-18 NOTE — Hospital Course (Signed)
Mark Chang is a 77 y.o. male with a history of colon cancer s/p left colectomy and end colostomy, diabetes mellitus, hypertension. Patient presented secondary to pain around ostomy with concern for cellulitis. He was also found to have evidence of atrial fibrillation with RVR. Atrial fibrillation was managed with diltiazem drip and metoprolol. Diltiazem drip weaned off and patient reverted to sinus rhythm. Transthoracic Echocardiogram significant for evidence of LVEF dysfunction. Patient started on Eliquis, continued on metoprolol and referred to cardiology for follow-up.

## 2022-05-18 NOTE — Progress Notes (Signed)
Mobility Specialist - Progress Note  Pre-mobility: 95 bpm HR, During mobility: 121 bpm HR, Post-mobility: 100 bpm HR,   05/18/22 0904  Mobility  Activity Ambulated with assistance in hallway  Level of Assistance Standby assist, set-up cues, supervision of patient - no hands on  Assistive Device Front wheel walker  Distance Ambulated (ft) 500 ft  Range of Motion/Exercises Active  Activity Response Tolerated well  $Mobility charge 1 Mobility   Pt was found in bed and agreeable to ambulate. Had no complaints during session and at EOS returned to recliner chair with all necessities in reach. Chair alarm on.  Ferd Hibbs Mobility Specialist

## 2022-05-18 NOTE — TOC Benefit Eligibility Note (Signed)
Patient Teacher, English as a foreign language completed.    The patient is currently admitted and upon discharge could be taking Eliquis 5 mg.  The current 30 day co-pay is $4.60.   The patient is insured through [place], [place] Patient Advocate Specialist [place] Patient Advocate Team Direct Number: [phone]  Fax: [phone]

## 2022-05-19 ENCOUNTER — Encounter: Payer: Self-pay | Admitting: *Deleted

## 2022-05-19 ENCOUNTER — Inpatient Hospital Stay: Payer: Medicare HMO | Attending: Oncology | Admitting: Oncology

## 2022-05-19 NOTE — Progress Notes (Signed)
SNF unable to bring him to appointment today. Per Dr. Benay Spice: schedule for 06/04/22 at 3:30. Scheduling message sent to call SNF and his brother, [name] w/appointment.

## 2022-05-23 NOTE — Progress Notes (Unsigned)
Cardiology Clinic Note   Patient Name: Mark Chang Date of Encounter: 05/26/2022  Primary Care Provider:  Terrill Mohr, NP Primary Cardiologist:  Mark Furbish, MD  Patient Profile    Mark Chang is a 77 y.o. male with a past medical history of PAF on anticoagulation, chronic combined systolic and diastolic heart failure, hypertension, hyperlipidemia, T2DM, CKD stage III, COPD, colon cancer s/p colectomy with colostomy, intellectual disability who presents to the clinic today for hospital follow-up.  Past Medical History    Past Medical History:  Diagnosis Date   CKD (chronic kidney disease), stage III ([place]) 03/24/2017   COPD (chronic obstructive pulmonary disease) ([place])    DM (diabetes mellitus), type 2 with renal complications ([place]) [id number]   Hyperlipidemia    Hypertension    Mental retardation    Obesity, Class III, BMI 40-49.9 (morbid obesity) ([place]) 03/24/2017   Pernicious anemia    Past Surgical History:  Procedure Laterality Date   COLON RESECTION SIGMOID N/A 10/24/2020   Procedure: EXPLORATORY LAPAROTOMY, RIGHT COLECTOMY WITH ANASTOMOSIS. LEFT COLECTOMY WITH COLOSTOMY, MOBILIZATION OF SPLENIC FLEXURE;  Surgeon: Chang, Mark Major, MD;  Location: [facility];  Service: General;  Laterality: N/A;    Allergies  No Known Allergies  History of Present Illness    Mark Chang has a past medical history of: PAF. Onset July 2022 during hospitalization for sepsis and bowel perforation. Recurrence February 2024. Converted on IV diltiazem. Started on Eliquis.  Chronic combined systolic and diastolic heart failure. Echo 05/17/2022: EF 40 to 45%.  Global hypokinesis.  Moderate concentric LVH.  Grade I DD.  Elevated LA pressure. Hypertension. Hyperlipidemia. T2DM. CKD stage III. COPD. Colon cancer. CT abdomen pelvis 10/19/2020: Pneumatosis intestinalis involving cecum and ascending colon concerning for colonic ischemia.  Colon distended.  Apparent constricting mass of  the sigmoid colon suspicious for carcinoma.  Minimal amount of extraluminal air in the anterior mid to lower abdomen.  No other abnormalities within the abdomen or pelvis. Right and left colectomy with colostomy 10/24/2020. Intellectual disability.  Mark Chang was first evaluated by Mark Chang on 10/21/2020 for new onset A-fib with RVR during hospitalization.  Patient presented to the ER on 10/19/2020 via EMS from skilled nursing facility for 2-week history of abdominal pain and diarrhea.  He was found to be hypotensive with EMS and also had a low-grade fever.  Labs revealed AKI and findings concerning for sepsis.  CT of the abdomen showed signs of a mass and colonic distention concerning for ischemia (details above).  On 10/20/2020 patient complained of chest pain and nurse noted heart rate in the 160s.  EKG showed A-fib with RVR.  Patient converted to normal sinus rhythm with PVCs after he was placed on IV amiodarone.  Anticoagulation was not started secondary to potential for GI surgery.  Echo showed EF 45 to 50%, Grade I DD, dilated IVC.  Patient was transitioned to p.o. amiodarone.  On 10/24/2020 patient was noted to have rising white count.  Chest x-ray showed free air under right diaphragm indicating suspected perforated cecum.  Decision was made to take patient to the OR emergently where he underwent right and left colectomy with colostomy.  Patient was discharged back to skilled nursing facility on 11/06/2020.  He was not followed up by cardiology as an outpatient.   Most recently patient presented to the emergency room on 05/16/2022 via EMS from skilled nursing facility for concerns of infected ostomy and need for colostomy bag.  Patient was tachycardic on  the monitor.  EKG showed A-fib with RVR.  Patient was placed on a diltiazem drip and anticoagulation was started.  He was admitted for cellulitis.  Echo showed EF 40 to 45%, global hypokinesis, moderate concentric LVH, Grade I DD (further details above).   Patient converted to sinus rhythm prior to discharge.  He was started on Eliquis and scheduled for outpatient cardiology follow-up.  Today, patient is accompanied by his brother, Mark Chang, who is his POA. Patient reports he is doing well. He states he feels his heart goes fast sometimes. Patient denies chest pain. Brother reports patient will sometimes get "a little winded" when walking longer distances. This is not new and questions if this is due to deconditioning. Patient reports he sits a lot in his room. He denies lower extremity edema. No orthopnea or PND reported. Brother believes patient's weight has been stable in the nursing home but it unsure how frequently it is being checked. He states BP normally runs in the 120/70s. Denies blood in stool or urine or other bleeding concerns.    Home Medications    Current Meds  Medication Sig   acetaminophen (TYLENOL) 500 MG tablet Take 500 mg by mouth 3 (three) times daily.   apixaban (ELIQUIS) 5 MG TABS tablet Take 1 tablet (5 mg total) by mouth 2 (two) times daily.   atorvastatin (LIPITOR) 40 MG tablet Take 40 mg by mouth at bedtime.   carboxymethylcellulose (REFRESH PLUS) 0.5 % SOLN Place 1 drop into both eyes 2 (two) times daily.   dextrose (GLUTOSE) 40 % GEL Take 1 Tube by mouth once as needed for low blood sugar.   escitalopram (LEXAPRO) 10 MG tablet Take 10 mg by mouth daily.   famotidine (PEPCID) 20 MG tablet Take 20 mg by mouth 2 (two) times daily.   feeding supplement (ENSURE ENLIVE / ENSURE PLUS) LIQD Take 237 mLs by mouth 3 (three) times daily between meals.   gabapentin (NEURONTIN) 100 MG capsule Take 100 mg by mouth 2 (two) times daily.    guaifenesin (ROBITUSSIN) 100 MG/5ML syrup Take 200 mg by mouth 3 (three) times daily as needed for cough.   insulin aspart (NOVOLOG FLEXPEN) 100 UNIT/ML FlexPen Inject 5 Units into the skin 3 (three) times daily with meals.   insulin aspart (NOVOLOG) 100 UNIT/ML injection CBG 70 - 120: 0 units CBG 121 -  150: 2 units CBG 151 - 200: 3 units CBG 201 - 250: 5 units CBG 251 - 300: 8 units CBG 301 - 350: 11 units CBG 351 - 400: 15 units   Insulin Glargine (BASAGLAR KWIKPEN) 100 UNIT/ML Inject 40 Units into the skin at bedtime.   lisinopril (ZESTRIL) 20 MG tablet Take 1 tablet (20 mg total) by mouth daily.   loperamide (IMODIUM) 2 MG capsule Take 1 capsule (2 mg total) by mouth 3 (three) times daily.   Melatonin 5 MG SUBL Take 3 mg by mouth at bedtime.   Multiple Vitamin (MULTIVITAMIN WITH MINERALS) TABS tablet Take 1 tablet by mouth daily.   Nutritional Supplements (,FEEDING SUPPLEMENT, PROSOURCE PLUS) liquid Take 30 mLs by mouth 2 (two) times daily between meals.   polycarbophil (FIBERCON) 625 MG tablet Take 1 tablet (625 mg total) by mouth 2 (two) times daily.   TRULICITY 1.5 0000000 SOPN Inject 1.5 mg into the skin every Thursday.   vitamin B-12 (CYANOCOBALAMIN) 1000 MCG tablet Take 1,000 mcg by mouth daily.   [DISCONTINUED] metoprolol succinate (TOPROL XL) 50 MG 24 hr tablet Take 1 tablet (  50 mg total) by mouth daily. Take with or immediately following a meal.   [DISCONTINUED] metoprolol succinate (TOPROL-XL) 50 MG 24 hr tablet Take 1 tablet (50 mg total) by mouth in the morning and at bedtime. Take with or immediately following a meal.    Family History    Family History  Family history unknown: Yes   He indicated that his brother is alive.   Social History    Social History   Socioeconomic History   Marital status: Married    Spouse name: Not on file   Number of children: Not on file   Years of education: Not on file   Highest education level: Not on file  Occupational History   Not on file  Tobacco Use   Smoking status: Never   Smokeless tobacco: Never  Vaping Use   Vaping Use: Never used  Substance and Sexual Activity   Alcohol use: No   Drug use: Not on file   Sexual activity: Not on file  Other Topics Concern   Not on file  Social History Narrative   Not on  file   Social Determinants of Health   Financial Resource Strain: Not on file  Food Insecurity: No Food Insecurity (05/17/2022)   Hunger Vital Sign    Worried About Running Out of Food in the Last Year: Never true    Ran Out of Food in the Last Year: Never true  Transportation Needs: No Transportation Needs (05/17/2022)   PRAPARE - Hydrologist (Medical): No    Lack of Transportation (Non-Medical): No  Physical Activity: Not on file  Stress: Not on file  Social Connections: Not on file  Intimate Partner Violence: Not At Risk (05/17/2022)   Humiliation, Afraid, Rape, and Kick questionnaire    Fear of Current or Ex-Partner: No    Emotionally Abused: No    Physically Abused: No    Sexually Abused: No     Review of Systems    General:  No chills, fever, night sweats or weight changes.  Cardiovascular:  No chest pain, edema, orthopnea, palpitations, paroxysmal nocturnal dyspnea. Mild dyspnea with increased exertion.  Dermatological: No rash, lesions/masses Respiratory: No cough, dyspnea Urologic: No hematuria, dysuria Abdominal:   No nausea, vomiting, diarrhea, bright red blood per rectum, melena, or hematemesis Neurologic:  No visual changes, weakness, changes in mental status. All other systems reviewed and are otherwise negative except as noted above.  Physical Exam    VS:  BP 117/60   Pulse (!) 101   Ht '5\' 6"'$  (1.676 m)   Wt 233 lb (105.7 kg)   SpO2 93%   BMI 37.61 kg/m  , BMI Body mass index is 37.61 kg/m. GEN:  Well nourished, well developed, in no acute distress. Neck: Supple, no JVD, carotid bruits. Cardiac: RRR, no murmurs, rubs, or gallops. No clubbing, cyanosis, edema.  Radials/DP/PT 2+ and equal bilaterally.  Respiratory:  Respirations regular and unlabored, clear to auscultation bilaterally. GI: Soft, nontender, nondistended. Colostomy left abdomen.  MS: No deformity or atrophy. Skin: Warm and dry, no rash. Neuro: Strength  intact. Psych: Normal affect.  Accessory Clinical Findings    Recent Labs: 05/16/2022: B Natriuretic Peptide 83.1; TSH 1.156 05/17/2022: ALT 17; BUN 21; Creatinine, Ser 1.12; Hemoglobin 13.0; Platelets 198; Potassium 3.7; Sodium 138   Recent Lipid Panel    Component Value Date/Time   CHOL 144 03/23/2017 0816   TRIG 198 (H) 10/28/2020 0456   HDL 18 (L) 03/23/2017  0816   CHOLHDL 8.0 03/23/2017 0816   VLDL 66 (H) 03/23/2017 0816   LDLCALC 60 03/23/2017 0816         ECG personally reviewed by me today: Sinus tachycardia with PVCs, rate 101.  No significant changes from 11/18/2020.   CHA2DS2-VASc Score = 5   This indicates a 7.2% annual risk of stroke. The patient's score is based upon: CHF History: 1 HTN History: 1 Diabetes History: 1 Stroke History: 0 Vascular Disease History: 0 Age Score: 2 Gender Score: 0      Assessment & Plan   PAF.  Onset July 2022 during hospitalization for sepsis and bowel perforation.  Recurrence during most recent hospitalization 05/16/2022 for cellulitis.  Patient reports he feels his heart gets fast occasionally. Denies blood in stool or urine or other bleeding concerns. EKG is NSR with PVCs, rate 101. Increase metoprolol to twice a day.  Continue Eliquis. Appropriate Eliquis dose; patient does not meet age, weight, or kidney function criteria for dose adjustment. No history of falls. Uses walker for gait assistance.  Chronic combined systolic and diastolic heart failure.  Echo February 2024 showed EF 40 to 45%, global hypokinesis, moderate LVH, Grade I DD.  Patient denies lower extremity edema, orthopnea or PND. Brother reports patient gets somewhat winded with walking longer distances but recovers quickly with rest.  Euvolemic and well compensated on exam.  Continue lisinopril and metoprolol (see #1). May consider ischemic workup at next visit. Patient denies chest pain and dyspnea seems at baseline. Given patient's intellectual delay question  patient's tolerance of nuclear stress vs coronary CT vs PET CT. Patient's brother is in agreement to hold off on further testing for now.  Hypertension.  BP today 117/60. Patient denies dizziness or headaches. Continue lisinopril and metoprolol.       Disposition: Increase Metoprolol to 50 mg bid. Return in 8-10 weeks or sooner as needed.    Justice Britain. Janell Keeling, DNP, NP-C     05/26/2022, 11:03 AM [address] [place] [address] Office [phone] Fax [phone]

## 2022-05-26 ENCOUNTER — Encounter: Payer: Self-pay | Admitting: Student

## 2022-05-26 ENCOUNTER — Ambulatory Visit: Payer: Medicare HMO | Attending: Physician Assistant | Admitting: Student

## 2022-05-26 VITALS — BP 117/60 | HR 101 | Ht 66.0 in | Wt 233.0 lb

## 2022-05-26 DIAGNOSIS — I480 Paroxysmal atrial fibrillation: Secondary | ICD-10-CM | POA: Diagnosis not present

## 2022-05-26 DIAGNOSIS — I10 Essential (primary) hypertension: Secondary | ICD-10-CM

## 2022-05-26 DIAGNOSIS — I5042 Chronic combined systolic (congestive) and diastolic (congestive) heart failure: Secondary | ICD-10-CM

## 2022-05-26 MED ORDER — METOPROLOL SUCCINATE ER 50 MG PO TB24: 50.0000 mg | ORAL_TABLET | Freq: Two times a day (BID) | ORAL | 3 refills | Status: DC

## 2022-05-26 NOTE — Patient Instructions (Signed)
Medication Instructions:  Your physician has recommended you make the following change in your medication:   INCREASE the Toprol to twice a day   *If you need a refill on your cardiac medications before your next appointment, please call your pharmacy*   Lab Work: None ordered  If you have labs (blood work) drawn today and your tests are completely normal, you will receive your results only by: [place] (if you have MyChart) OR A paper copy in the mail If you have any lab test that is abnormal or we need to change your treatment, we will call you to review the results.   Testing/Procedures: None orderd   Follow-Up: At [facility], you and your health needs are our priority.  As part of our continuing mission to provide you with exceptional heart care, we have created designated Provider Care Teams.  These Care Teams include your primary Cardiologist (physician) and Advanced Practice Providers (APPs -  Physician Assistants and Nurse Practitioners) who all work together to provide you with the care you need, when you need it.  We recommend signing up for the patient portal called "MyChart".  Sign up information is provided on this After Visit Summary.  MyChart is used to connect with patients for Virtual Visits (Telemedicine).  Patients are able to view lab/test results, encounter notes, upcoming appointments, etc.  Non-urgent messages can be sent to your provider as well.   To learn more about what you can do with MyChart, go to [email or web address].    Your next appointment:   8-10  week(s)  Provider:   Candee Furbish, MD or Richardson Dopp, PA-C     Other Instructions

## 2022-06-04 ENCOUNTER — Inpatient Hospital Stay: Payer: Medicare HMO | Admitting: Oncology

## 2022-06-09 ENCOUNTER — Inpatient Hospital Stay: Payer: Medicare HMO | Attending: Oncology | Admitting: Oncology

## 2022-06-09 VITALS — BP 134/73 | HR 68 | Temp 98.1°F | Resp 18 | Ht 66.0 in | Wt 230.0 lb

## 2022-06-09 DIAGNOSIS — I10 Essential (primary) hypertension: Secondary | ICD-10-CM | POA: Insufficient documentation

## 2022-06-09 DIAGNOSIS — Z933 Colostomy status: Secondary | ICD-10-CM | POA: Diagnosis not present

## 2022-06-09 DIAGNOSIS — F79 Unspecified intellectual disabilities: Secondary | ICD-10-CM | POA: Insufficient documentation

## 2022-06-09 DIAGNOSIS — E119 Type 2 diabetes mellitus without complications: Secondary | ICD-10-CM | POA: Insufficient documentation

## 2022-06-09 DIAGNOSIS — R970 Elevated carcinoembryonic antigen [CEA]: Secondary | ICD-10-CM | POA: Diagnosis not present

## 2022-06-09 DIAGNOSIS — Z9049 Acquired absence of other specified parts of digestive tract: Secondary | ICD-10-CM | POA: Diagnosis not present

## 2022-06-09 DIAGNOSIS — C787 Secondary malignant neoplasm of liver and intrahepatic bile duct: Secondary | ICD-10-CM | POA: Insufficient documentation

## 2022-06-09 DIAGNOSIS — C786 Secondary malignant neoplasm of retroperitoneum and peritoneum: Secondary | ICD-10-CM | POA: Insufficient documentation

## 2022-06-09 DIAGNOSIS — C187 Malignant neoplasm of sigmoid colon: Secondary | ICD-10-CM | POA: Insufficient documentation

## 2022-06-09 NOTE — Progress Notes (Signed)
Mark Chang OFFICE PROGRESS NOTE   Diagnosis: Colon cancer  INTERVAL HISTORY:   Mark Chang returns for a scheduled visit.  He is here with his brother.  He reports intermittent discomfort at the colostomy site.  No other pain.  No other complaint.  Good appetite.  He continues his usual activities in the residential facility.  Objective:  Vital signs in last 24 hours:  Blood pressure 134/73, pulse 68, temperature 98.1 F (36.7 C), temperature source Oral, resp. rate 18, height '5\' 6"'$  (1.676 m), weight 230 lb (104.3 kg), SpO2 100 %.     Lymphatics: No cervical, supraclavicular, axillary, or inguinal nodes Resp: Lungs clear bilaterally Cardio: Regular rate and rhythm GI: No hepatosplenomegaly, left lower quadrant colostomy with brown stool, mild tenderness in the left abdomen, no mass Vascular: No leg edema   Lab Results:  Lab Results  Component Value Date   WBC 10.1 05/17/2022   HGB 13.0 05/17/2022   HCT 39.9 05/17/2022   MCV 89.7 05/17/2022   PLT 198 05/17/2022   NEUTROABS 5.9 05/16/2022    CMP  Lab Results  Component Value Date   NA 138 05/17/2022   K 3.7 05/17/2022   CL 105 05/17/2022   CO2 23 05/17/2022   GLUCOSE 109 (H) 05/17/2022   BUN 21 05/17/2022   CREATININE 1.12 05/17/2022   CALCIUM 8.7 (L) 05/17/2022   PROT 6.6 05/17/2022   ALBUMIN 3.1 (L) 05/17/2022   AST 22 05/17/2022   ALT 17 05/17/2022   ALKPHOS 62 05/17/2022   BILITOT 0.9 05/17/2022   GFRNONAA >60 05/17/2022   GFRAA >60 03/24/2017    Lab Results  Component Value Date   CEA1 6.3 (H) 10/20/2020   CEA 5.67 (H) 04/17/2022    Lab Results  Component Value Date   INR 1.1 05/16/2022   LABPROT 13.6 05/16/2022    Imaging:  No results found.  Medications: I have reviewed the patient's current medications.   Assessment/Plan: Sigmoid colon cancer, stage IIIb (T3N1b), status post a left colectomy with end colostomy 10/16/2020 Tumor involves paraglottic soft tissue, 3/12  lymph nodes positive, 1 tumor deposit, lymphovascular invasion present, no loss of mismatch repair protein expression, MSS CT abdomen/pelvis 10/19/2020-pneumatosis intestinalis involving the cecum and ascending colon, constricting mass in the sigmoid colon, liver morphology suggestive of cirrhosis, gallstones CT chest 11/02/2020 -7 mm right upper lobe nodule, small right pleural effusion CT chest 01/27/2022-unchanged right upper lobe lung nodule, no other pulmonary nodules noted. CEA mildly elevated 04/17/2022 CTs 04/27/2022-new hypodense lesion in hepatic segment 8 with an additional subtle lesion in hepatic segment 7, metastatic nodular soft tissue in the left lower quadrant mesentery   2.   Cecum perforation/septic shock secondary to an obstructing sigmoid colon cancer, status post a right colectomy on 10/24/2020 3.   Diabetes 4.   Mental retardation 5.   Hypertension     Disposition: Mark Chang was diagnosed with stage III colon cancer in July 2022.  The CEA is mildly elevated and CTs in January are consistent with development of metastatic disease involving the liver and an abdominal mesenteric mass.  I discussed the CT findings and the reviewed the images with Mark Chang and his brother.  We discussed treatment options.  His brother feels he is not a candidate for systemic therapy based on his mental disability.  His brother has discussed the case with hospice.  He plans to initiate a hospice referral if Mark Chang condition declines.  We discussed the likelihood of  disease progression over the next 6 to 12 months.  There is no plan for repeating a CEA or surveillance images.  Mark Chang will return for an office visit in 3 months.  His brother will contact us in the interim if Mark Chang develops new symptoms.  Betsy Coder, MD  06/09/2022  8:45 AM

## 2022-06-29 ENCOUNTER — Telehealth: Payer: Self-pay | Admitting: *Deleted

## 2022-06-29 NOTE — Telephone Encounter (Signed)
Brother, Mark Chang left VM asking what to do for his brother. He is not eating and getting weaker and refused to come out of his room this weekend. Asking if he needs to be seen or hospice referral needed now? @ 1004 this RN returned call and left VM to ask facility nurse supervisor or director of unit to assess him and see if facility provider would see him to assess status and who would order hospice for him if appropriate for him. This was discussed w/Dr. Benay Spice at last visit.

## 2022-07-19 DIAGNOSIS — I480 Paroxysmal atrial fibrillation: Secondary | ICD-10-CM | POA: Insufficient documentation

## 2022-07-19 NOTE — Progress Notes (Unsigned)
Cardiology Office Note:    Date:  07/20/2022  ID:  MILANO ROSEVEAR, DOB 1945-07-01, MRN [id number] PCP: Koren Bound, NP  [facility] Providers Cardiologist:  Donato Schultz, MD       Patient Profile:   [place]ox Atrial fibrillation  HFmrEF (heart failure with mildly reduced ejection fraction)  TTE 03/23/17: EF 45-50 TTE 10/21/20: EF 45-50 TTE 05/17/22: EF 40-45, global HK, mod conc LVH, Gr 1 DD, NL RVSF, AV sclerosis, RAP 3 Diabetes mellitus  Chronic kidney disease stage  Chronic Obstructive Pulmonary Disease  Hypertension  Intellectual disability  Sigmoid Colon CA s/p L colectomy     History of Present Illness:   Mark Chang is a 77 y.o. male who returns for f/u on AFib, HFmrEF. He was initially seen in consultation by Dr. Anne Fu for AFib w RVR during an admission in July 2022 for new colon mass. He was never seen in follow up in the office. He was admitted 2/17-2/19 with cellulitis at his ostomy site c/b AFib w RVR. He converted to NSR on Dilt gtt and was started on Eliquis. EF remained low at 40-45 on TTE. He was seen in the office by Carlos Levering, NP. He was in sinus rhythm w a HR of 101. His metoprolol XL was increased to 50 mg twice daily. He is here today with an aide from the [facility] facility. His brother is not with him today. He feels his heart race at times. He notes occ chest pain and shortness of breath. These are chronic symptoms w/o change. He has not had syncope. He has not had orthopnea.   Review of Systems  Gastrointestinal:  Negative for hematochezia.   see HPI    Studies Reviewed:    EKG:  not done  Risk Assessment/Calculations:    CHA2DS2-VASc Score = 5   This indicates a 7.2% annual risk of stroke. The patient's score is based upon: CHF History: 1 HTN History: 1 Diabetes History: 1 Stroke History: 0 Vascular Disease History: 0 Age Score: 2 Gender Score: 0            Physical Exam:   VS:  BP 124/62   Pulse 86   Ht  (1.676 m)    Wt 231 lb 3.2 oz (104.9 kg)   SpO2 92%   BMI 37.32 kg/m    Wt Readings from Last 3 Encounters:  07/20/22 231 lb 3.2 oz (104.9 kg)  06/09/22 230 lb (104.3 kg)  05/26/22 233 lb (105.7 kg)    Constitutional:      Appearance: Healthy appearance. Not in distress.  Neck:     Vascular: JVD normal.  Pulmonary:     Breath sounds: Normal breath sounds. No wheezing. No rales.  Cardiovascular:     Normal rate. Regular rhythm.     Murmurs: There is no murmur.  Edema:    Peripheral edema present.    Pretibial: bilateral 1+ edema of the pretibial area. Abdominal:     Palpations: Abdomen is soft.       ASSESSMENT AND PLAN:   Heart failure with mildly reduced ejection fraction (HFmrEF) EF 40-45.  He has not had an ischemia evaluation in the past.  This was discussed with his brother at the last visit.  Decision was made to hold off at that time.  His brother is not with him today.  His understanding of his medical condition is limited.  He does not seem to be having any unstable symptoms.  At  this point, I would hold off on proceeding with ischemia evaluation.  He does have some swelling in his legs.  I have recommended advancing his GDMT further by adding spironolactone 12.5 mg daily.  Obtain BMET weekly x 2.  Continue lisinopril 20 mg daily, Toprol-XL 50 mg twice daily.  Follow-up in 3 to 4 months.  Consider changing ACE to ARNI. We could consider repeating his echocardiogram once his GDMT is maximized to determine if he needs further testing.  Paroxysmal atrial fibrillation Maintaining sinus rhythm by exam.  Hemoglobin was normal at 13 in February 2024.  Creatinine was normal at 1.12 in February 2024.  Continue Eliquis 5 mg twice daily.  Continue Toprol-XL 50 mg twice daily.       Dispo:  Return in about 3 months (around 10/19/2022) for Routine Follow Up w Dr. Anne Fu.  Signed, Tereso Newcomer, PA-C

## 2022-07-20 ENCOUNTER — Encounter: Payer: Self-pay | Admitting: Physician Assistant

## 2022-07-20 ENCOUNTER — Ambulatory Visit: Payer: Medicare HMO | Attending: Physician Assistant | Admitting: Physician Assistant

## 2022-07-20 VITALS — BP 124/62 | HR 86 | Ht 66.0 in | Wt 231.2 lb

## 2022-07-20 DIAGNOSIS — I5022 Chronic systolic (congestive) heart failure: Secondary | ICD-10-CM | POA: Diagnosis not present

## 2022-07-20 DIAGNOSIS — I480 Paroxysmal atrial fibrillation: Secondary | ICD-10-CM | POA: Diagnosis not present

## 2022-07-20 MED ORDER — SPIRONOLACTONE 25 MG PO TABS: 12.5000 mg | ORAL_TABLET | Freq: Every day | ORAL | 3 refills | Status: DC

## 2022-07-20 NOTE — Assessment & Plan Note (Addendum)
EF 40-45.  He has not had an ischemia evaluation in the past.  This was discussed with his brother at the last visit.  Decision was made to hold off at that time.  His brother is not with him today.  His understanding of his medical condition is limited.  He does not seem to be having any unstable symptoms.  At this point, I would hold off on proceeding with ischemia evaluation.  He does have some swelling in his legs.  I have recommended advancing his GDMT further by adding spironolactone 12.5 mg daily.  Obtain BMET weekly x 2.  Continue lisinopril 20 mg daily, Toprol-XL 50 mg twice daily.  Follow-up in 3 to 4 months.  Consider changing ACE to ARNI. We could consider repeating his echocardiogram once his GDMT is maximized to determine if he needs further testing.

## 2022-07-20 NOTE — Patient Instructions (Signed)
Medication Instructions:   START Spironolactone one half tablet (0.5) by mouth ( 12.5 mg) daily.   *If you need a refill on your cardiac medications before your next appointment, please call your pharmacy*   Lab Work:  Your physician recommends that you return for lab work on Monday, April 29. You can come in on the day of your appointment anytime between 7:30-4:30.  Your physician recommends that you return for lab work on Friday, May 10. You can come in on the day of your appointment anytime between 7:30-4:30.   If you have labs (blood work) drawn today and your tests are completely normal, you will receive your results only by: MyChart Message (if you have MyChart) OR A paper copy in the mail If you have any lab test that is abnormal or we need to change your treatment, we will call you to review the results.   Testing/Procedures:  None ordered.   Follow-Up: At [facility], you and your health needs are our priority.  As part of our continuing mission to provide you with exceptional heart care, we have created designated Provider Care Teams.  These Care Teams include your primary Cardiologist (physician) and Advanced Practice Providers (APPs -  Physician Assistants and Nurse Practitioners) who all work together to provide you with the care you need, when you need it.  We recommend signing up for the patient portal called "MyChart".  Sign up information is provided on this After Visit Summary.  MyChart is used to connect with patients for Virtual Visits (Telemedicine).  Patients are able to view lab/test results, encounter notes, upcoming appointments, etc.  Non-urgent messages can be sent to your provider as well.   To learn more about what you can do with MyChart, go to [email or web address].    Your next appointment:   3 month(s)  Provider:   Donato Schultz, MD

## 2022-07-20 NOTE — Assessment & Plan Note (Signed)
Maintaining sinus rhythm by exam.  Hemoglobin was normal at 13 in February 2024.  Creatinine was normal at 1.12 in February 2024.  Continue Eliquis 5 mg twice daily.  Continue Toprol-XL 50 mg twice daily.

## 2022-07-27 ENCOUNTER — Ambulatory Visit: Payer: Medicare HMO | Attending: Cardiovascular Disease

## 2022-07-27 DIAGNOSIS — I5022 Chronic systolic (congestive) heart failure: Secondary | ICD-10-CM

## 2022-07-27 DIAGNOSIS — I480 Paroxysmal atrial fibrillation: Secondary | ICD-10-CM

## 2022-07-28 LAB — BASIC METABOLIC PANEL
BUN/Creatinine Ratio: 33 — ABNORMAL HIGH (ref 10–24)
BUN: 36 mg/dL — ABNORMAL HIGH (ref 8–27)
CO2: 18 mmol/L — ABNORMAL LOW (ref 20–29)
Calcium: 9.3 mg/dL (ref 8.6–10.2)
Chloride: 108 mmol/L — ABNORMAL HIGH (ref 96–106)
Creatinine, Ser: 1.1 mg/dL (ref 0.76–1.27)
Glucose: 99 mg/dL (ref 70–99)
Potassium: 5.1 mmol/L (ref 3.5–5.2)
Sodium: 140 mmol/L (ref 134–144)
eGFR: 69 mL/min/{1.73_m2} (ref >59)

## 2022-07-29 ENCOUNTER — Telehealth: Payer: Self-pay | Admitting: *Deleted

## 2022-07-29 MED ORDER — SPIRONOLACTONE 25 MG PO TABS: 25.0000 mg | ORAL_TABLET | ORAL | 3 refills | Status: DC

## 2022-07-29 MED ORDER — SPIRONOLACTONE 25 MG PO TABS: 12.5000 mg | ORAL_TABLET | ORAL | 3 refills | Status: DC

## 2022-07-29 NOTE — Telephone Encounter (Signed)
-----   Message from Beatrice Lecher, [place] sent at 07/28/2022  8:28 AM EDT ----- BUN/Creatinine ratio increased since starting on Spironolactone. K+ is normal but in the high end of the range.  PLAN:  -Change Spironolactone to 12.5 mg on Mon, Wed, Fri only. -If pt took Spironolactone already today, hold tomorrow and start first dose of new schedule on Fri -Repeat BMET 1 week Tereso Newcomer, PA-C    07/28/2022 8:26 AM

## 2022-07-29 NOTE — Addendum Note (Signed)
Addended by: Burnetta Sabin on: 07/29/2022 01:46 PM   Modules accepted: Orders

## 2022-08-07 ENCOUNTER — Ambulatory Visit: Payer: Medicare HMO

## 2022-08-10 ENCOUNTER — Other Ambulatory Visit: Payer: Medicare HMO

## 2022-08-14 ENCOUNTER — Other Ambulatory Visit: Payer: Medicare HMO

## 2022-08-14 ENCOUNTER — Ambulatory Visit: Payer: Medicare HMO | Admitting: Oncology

## 2022-08-29 DEATH — deceased

## 2022-09-08 ENCOUNTER — Ambulatory Visit: Payer: Medicare HMO | Admitting: Oncology

## 2022-09-08 ENCOUNTER — Other Ambulatory Visit: Payer: Medicare HMO

## 2022-09-25 ENCOUNTER — Ambulatory Visit: Payer: Medicare HMO | Admitting: Cardiology

## 2022-10-26 ENCOUNTER — Ambulatory Visit: Payer: Medicare HMO | Admitting: Cardiology

## 2023-06-27 IMAGING — CT CT ABD-PELV W/ CM
2 of 5 series · 15 of 46 positions shown, 17 images · IV contrast (APPLIED)
Comparison: CT abdomen pelvis 10/19/2020

CLINICAL DATA: POD 6, S/P right colectomy with anastomosis, left
colectomy with end colostomy, mobilization of splenic flexure
10/24/20 Dr. Chimene Darlene for colonic obstruction secondary to
sigmoid adenocarcinoma with [DATE] LN +, incidental neuroendocrine
appendiceal tumor (resected at time of surgery)

EXAM:
CT ABDOMEN AND PELVIS WITH CONTRAST
TECHNIQUE: Multidetector CT imaging of the abdomen and pelvis was performed
using the standard protocol following bolus administration of
intravenous contrast.
CONTRAST:  100mL OMNIPAQUE IOHEXOL 350 MG/ML SOLN

[Series 2: axial st · axial · 0.90mm/px · z∈[-645,-190]mm · 12 of 107 slices shown, 14 images]
[im 8/107  soft-tissue]
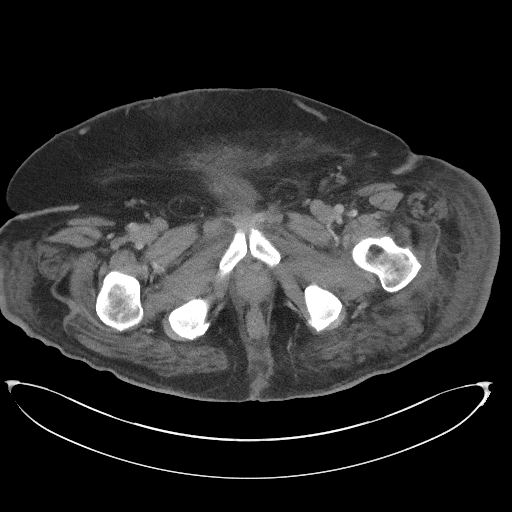
[im 8/107  bone]
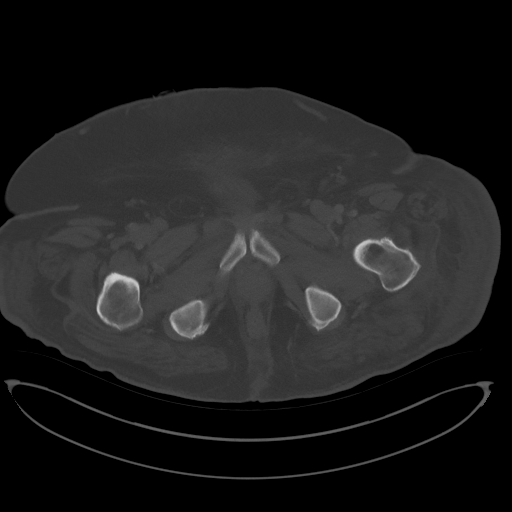
[im 15/107  soft-tissue]
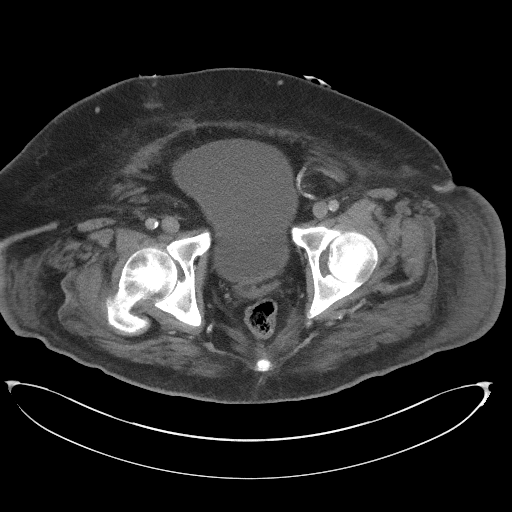
[im 22/107  soft-tissue]
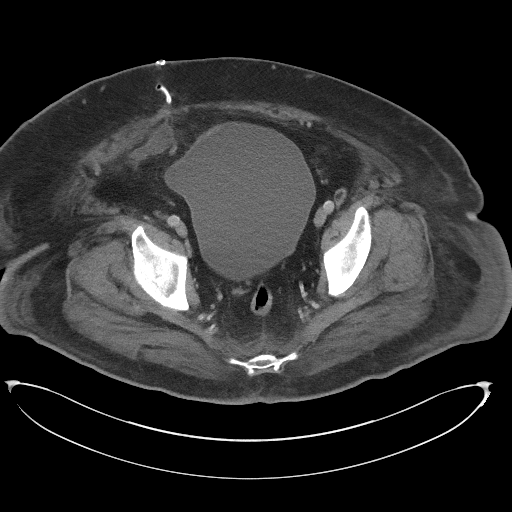
[im 36/107  soft-tissue]
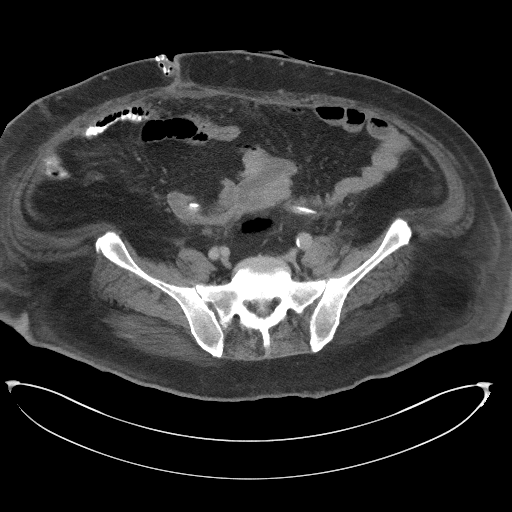
[im 43/107  soft-tissue]
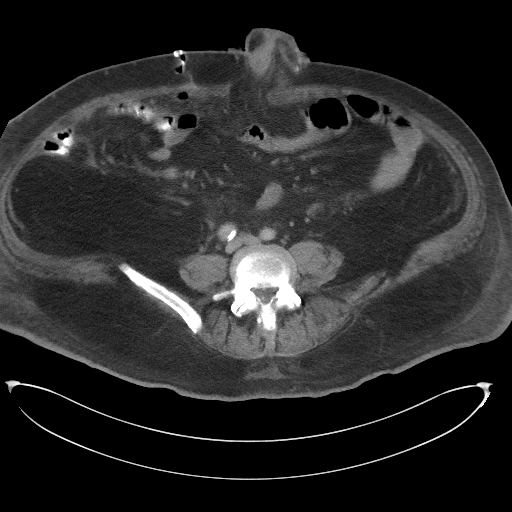
[im 50/107  soft-tissue]
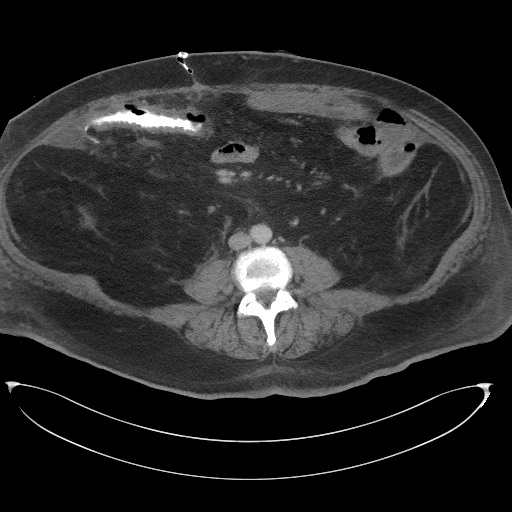
[im 57/107  soft-tissue]
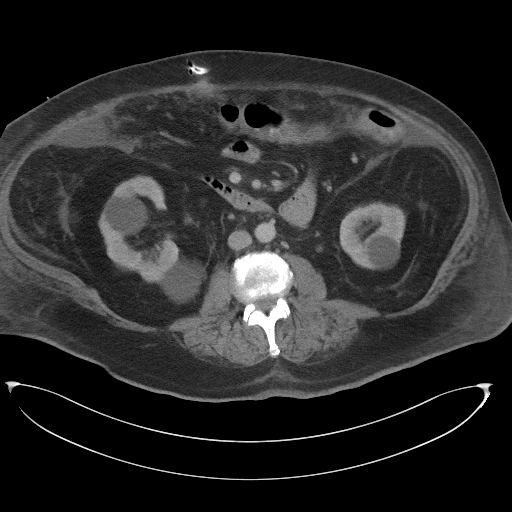
[im 64/107  soft-tissue]
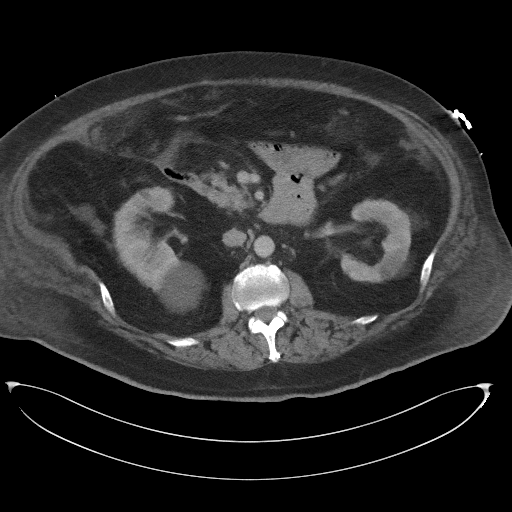
[im 71/107  soft-tissue]
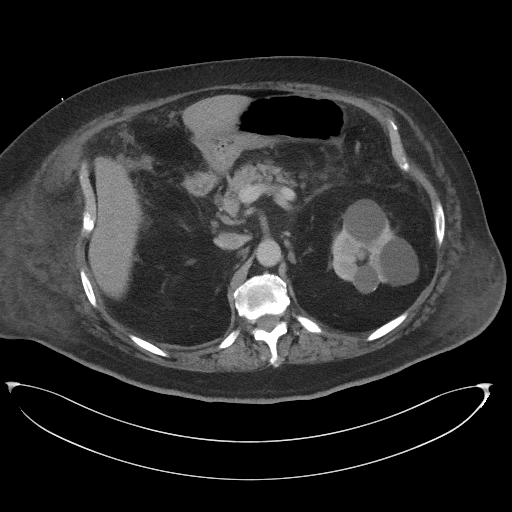
[im 71/107  bone]
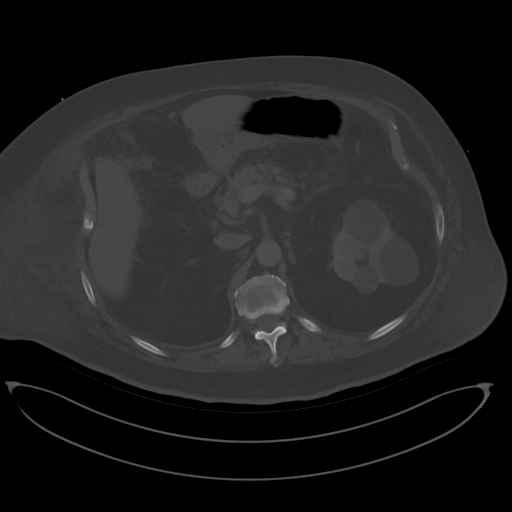
[im 85/107  soft-tissue]
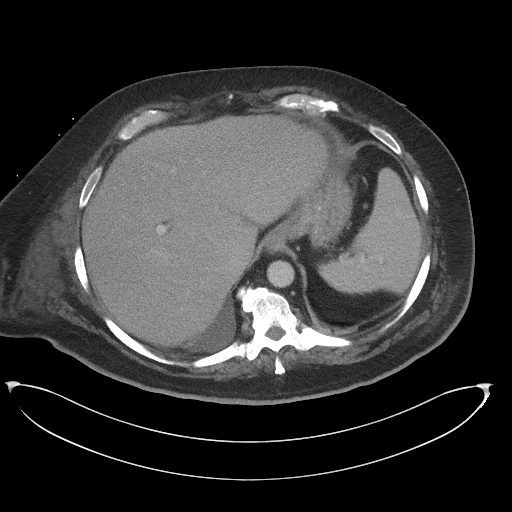
[im 92/107  soft-tissue]
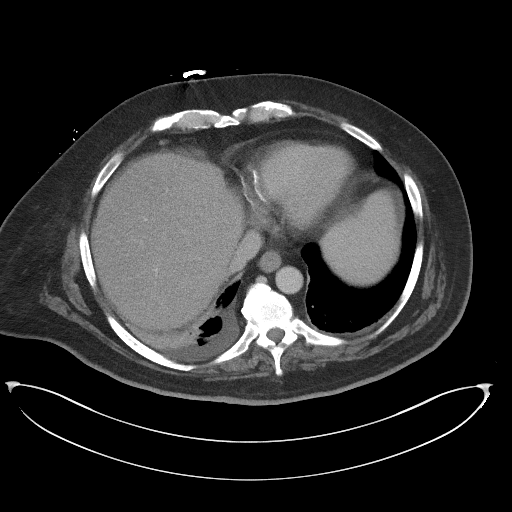
[im 99/107  soft-tissue]
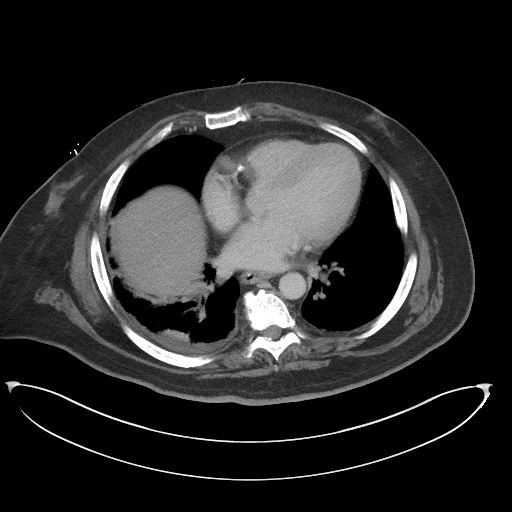

[Series 5: coronal st · coronal · 1.00mm/px · 3 of 108 slices shown]
[im 36/108  soft-tissue]
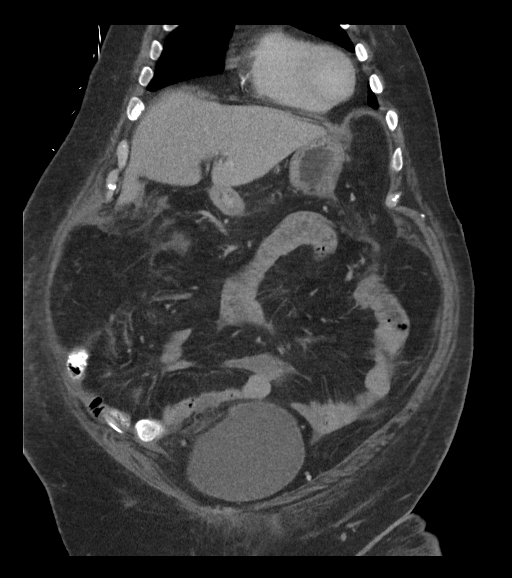
[im 48/108  soft-tissue]
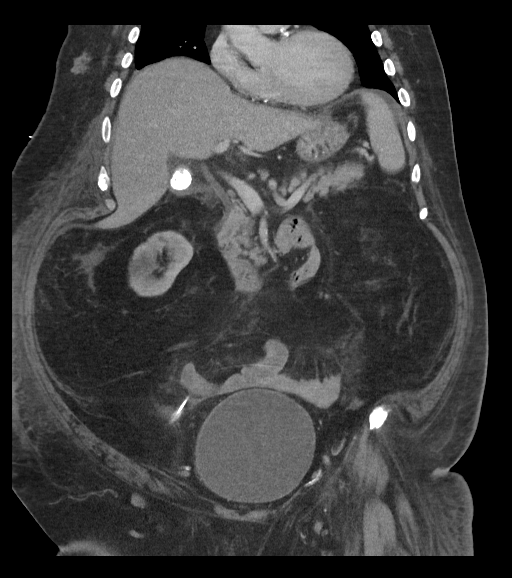
[im 60/108  soft-tissue]
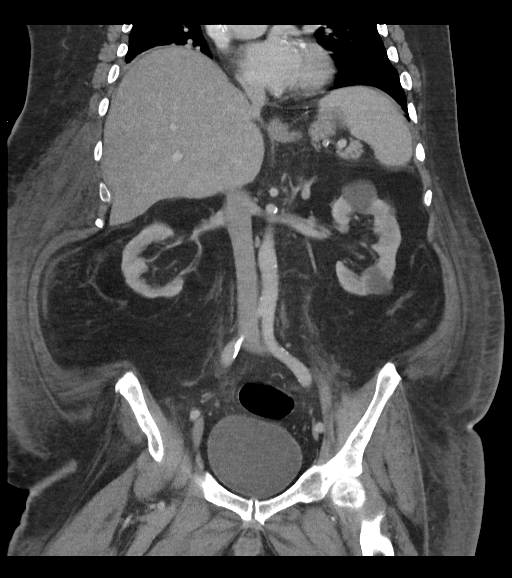

[15 of 46 positions shown; findings below may reference images not displayed]

FINDINGS: Lower chest: Trace right pleural effusion. Right lower lobe airspace
opacity. Central venous catheter terminating at the superior
cavoatrial junction. Coronary artery calcifications involving at
least 3 vessels.

Hepatobiliary: The hepatic parenchyma is diffusely hypodense
compared to the splenic parenchyma consistent with fatty
infiltration. No focal liver abnormality. Multiple calcified
gallstones within the gallbladder lumen. No gallbladder wall
thickening or pericholecystic fluid. No biliary dilatation.

Pancreas: No focal lesion. Normal pancreatic contour. No surrounding
inflammatory changes. No main pancreatic ductal dilatation.

Spleen: Normal in size without focal abnormality.

Adrenals/Urinary Tract:

No adrenal nodule bilaterally.

Bilateral kidneys enhance symmetrically. Several bilateral fluid
density lesions within the kidneys likely represent simple renal
cysts. Subcentimeter hypodensities are too small to characterize.

No hydronephrosis. No hydroureter.

The urinary bladder is unremarkable.

Stomach/Bowel: Surgical changes related to Mokonen Javu
formation ([DATE]) and partial left colectomy, partial right colectomy
status post ileocolonic anastomosis ([DATE]), left lower quadrant end
colostomy formation ([DATE]). Partial opacification of the terminal
ileum and transverse colon in the right abdomen. No PO contrast
extravasation noted on the portal venous or delayed phase. Stomach
is within normal limits. No evidence of small bowel wall thickening
or dilatation. Proximal transverse colon bowel wall thickening and
mild pericolonic fat stranding.

Vascular/Lymphatic: No abdominal aorta or iliac aneurysm. Mild
atherosclerotic plaque of the aorta and its branches. No abdominal,
pelvic, or inguinal lymphadenopathy.

Reproductive: Prostate is unremarkable.

Other: Trace to small volume free fluid within the right abdomen
with a couple of tiny foci of associated free gas (2: 47-60). No
organized fluid collection. Right surgical drain with tip
terminating in the pelvis.

Musculoskeletal:

Healing anterior abdominal incision. No hernia formation. Mild
subcutaneus soft tissue edema.

No suspicious lytic or blastic osseous lesions. No acute displaced
fracture. Multilevel degenerative changes of the spine.
IMPRESSION: 1. Inflammatory changes of the proximal transverse colon just distal
to the side-to-side ileocolonic anastomosis.
2. Trace to small volume free fluid within the right abdomen may be
postsurgical versus inflammatory. Couple tiny foci of gas within the
right abdomen are likely postsurgical in etiology.
3. No definite findings of bowel leak or anastomotic suture
dehiscence in a patient status post partial right colectomy with
ileocolonic side-to-side anastomosis, partial left colectomy, and
left lower quadrant end colostomy with Praful Henke formation.
4. Cholelithiasis with no CT findings to suggest acute
cholecystitis.
5.  Aortic Atherosclerosis ([id number]).

## 2023-06-30 IMAGING — CT CT CHEST W/O CM
2 of 4 series · 15 of 36 positions shown, 18 images · non-contrast
Comparison: None.

CLINICAL DATA: Sigmoid colon cancer, status post resection, chest
staging

EXAM:
CT CHEST WITHOUT CONTRAST
TECHNIQUE: Multidetector CT imaging of the chest was performed following the
standard protocol without IV contrast.

[Series 2: thorax · axial · 0.84mm/px · z∈[-263,-21]mm · 12 of 143 slices shown, 15 images]
[im 11/143  mediastinal]
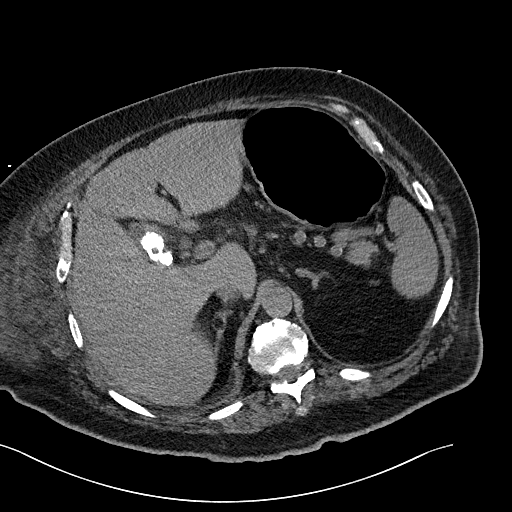
[im 11/143  lung]
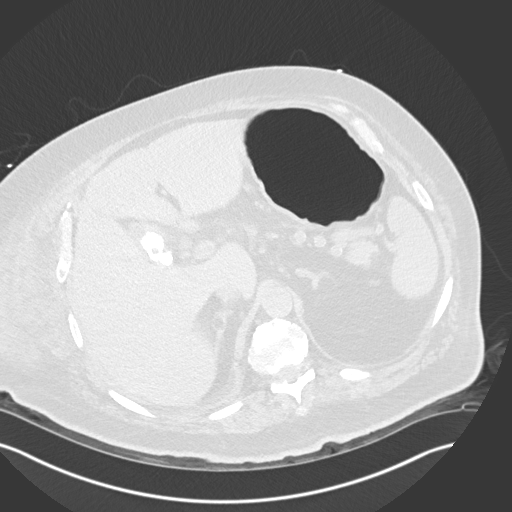
[im 22/143  lung]
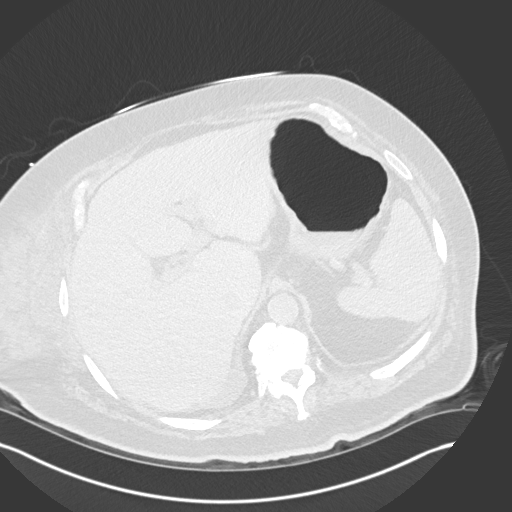
[im 33/143  lung]
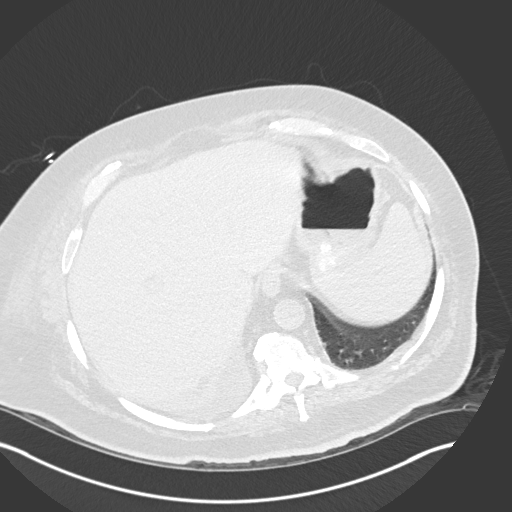
[im 44/143  lung]
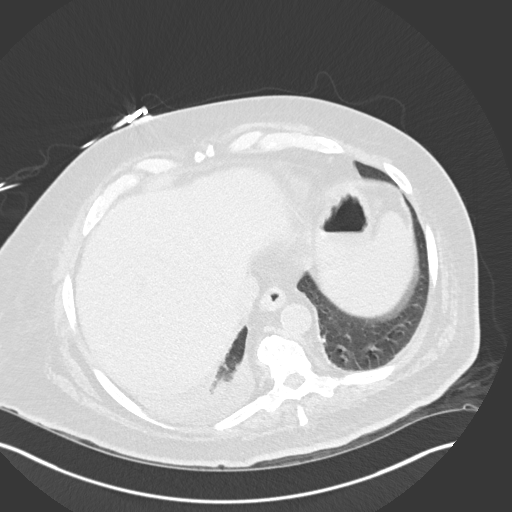
[im 55/143  mediastinal]
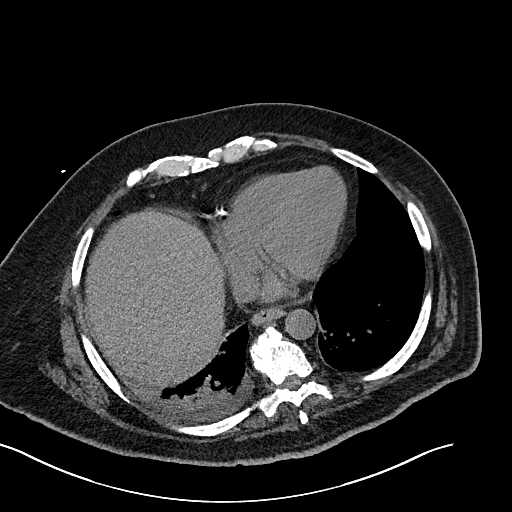
[im 55/143  lung]
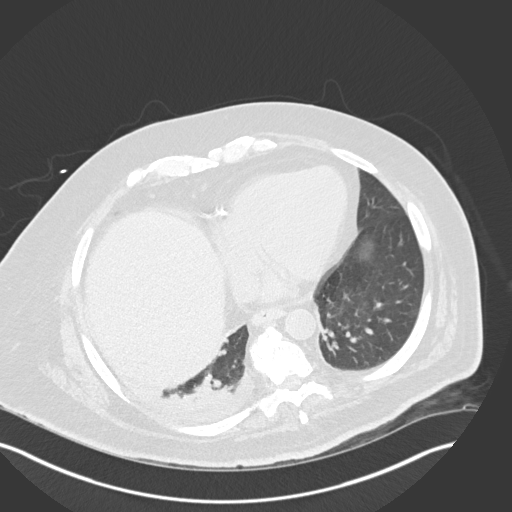
[im 66/143  lung]
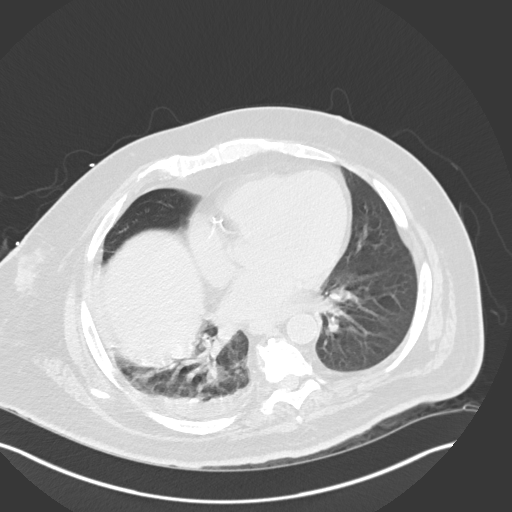
[im 77/143  lung]
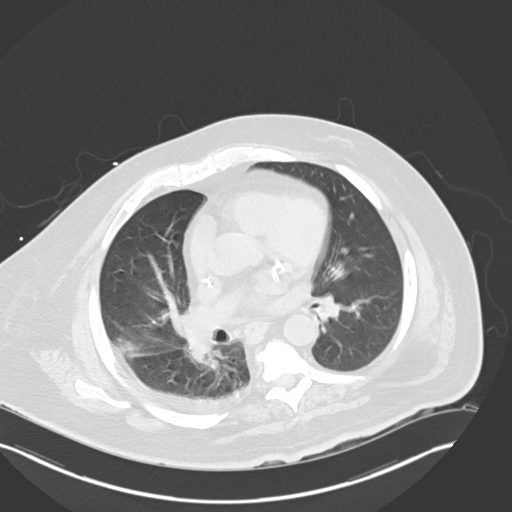
[im 88/143  lung]
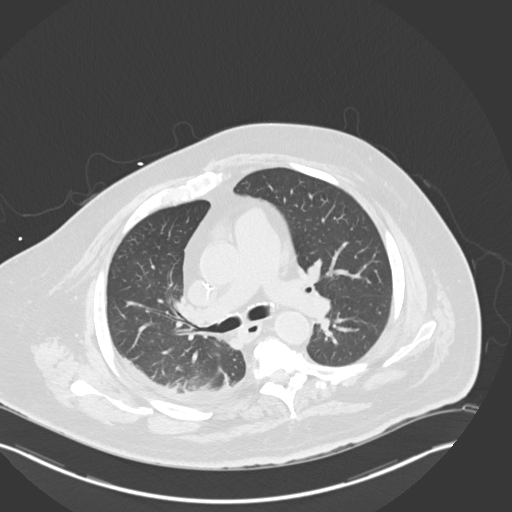
[im 99/143  mediastinal]
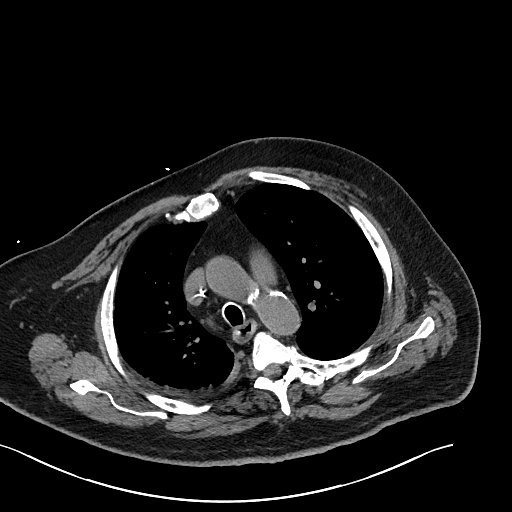
[im 99/143  lung]
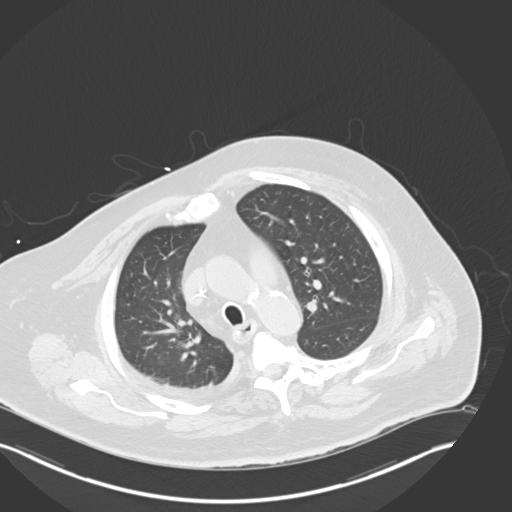
[im 110/143  lung]
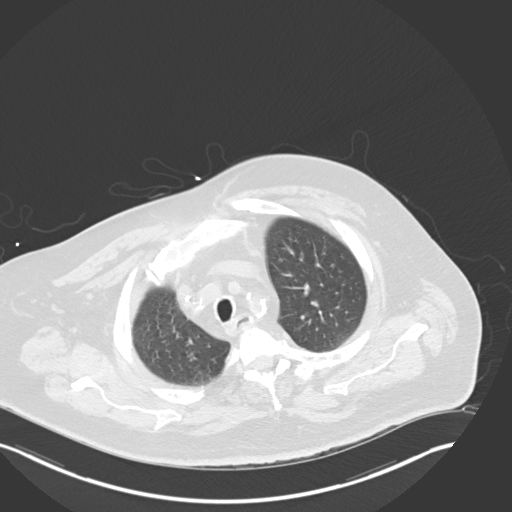
[im 121/143  lung]
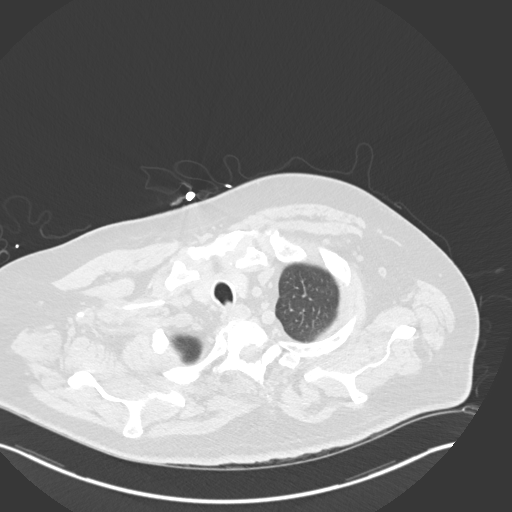
[im 132/143  lung]
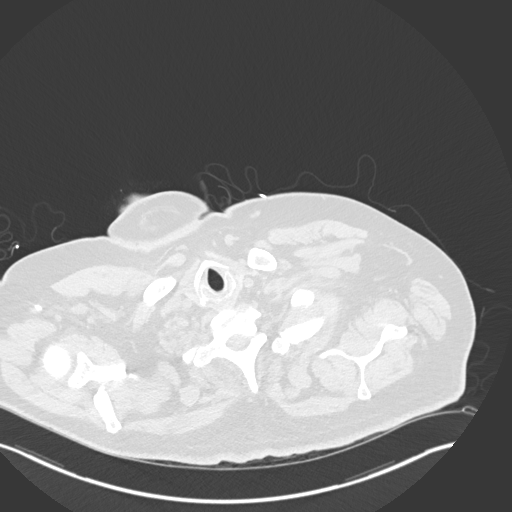

[Series 6: coronal · coronal · 0.59mm/px · 3 of 151 slices shown]
[im 31/151  lung]
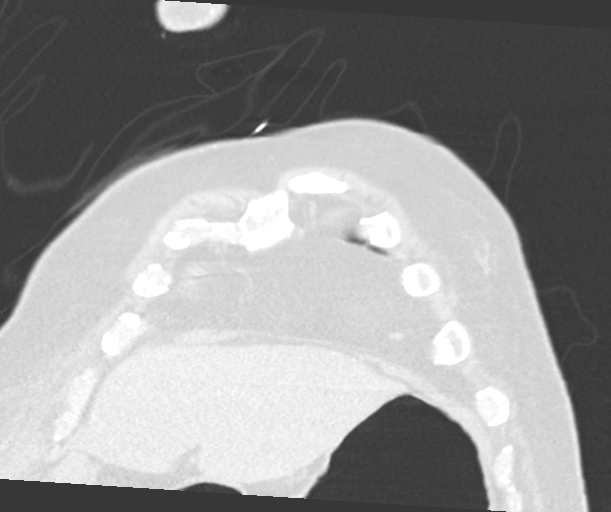
[im 61/151  lung]
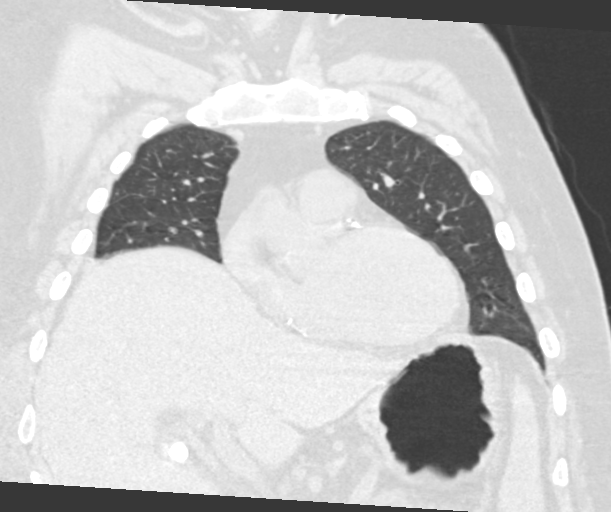
[im 91/151  lung]
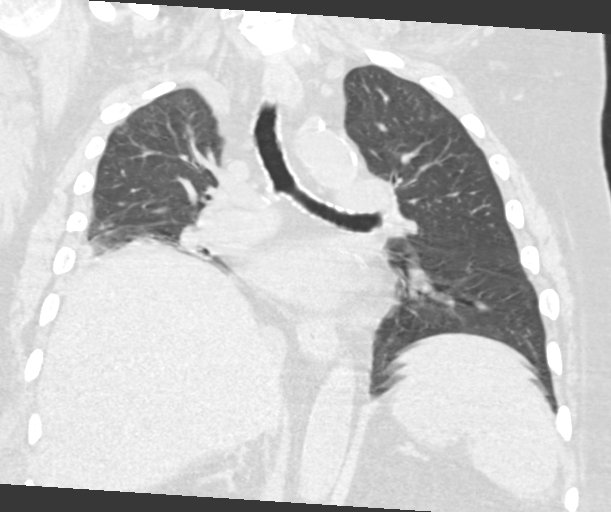

[15 of 36 positions shown; findings below may reference images not displayed]

FINDINGS: Cardiovascular: Aortic atherosclerosis. Right upper extremity PICC,
with tip position near the superior cavoatrial junction.
Cardiomegaly. Three-vessel coronary artery calcifications and/or
stents. No pericardial effusion.

Mediastinum/Nodes: No enlarged mediastinal, hilar, or axillary lymph
nodes. Thyroid gland, trachea, and esophagus demonstrate no
significant findings.

Lungs/Pleura: Small right pleural effusion with associated
atelectasis or consolidation. There is a 7 mm pulmonary nodule of
the right upper lobe (series 5, image 49).

Upper Abdomen: No acute abnormality.  Gallstones in the gallbladder.

Musculoskeletal: No chest wall mass or suspicious bone lesions
identified.
IMPRESSION: 1. Small right pleural effusion with associated atelectasis or
consolidation.
2. There is a 7 mm pulmonary nodule of the right upper lobe,
nonspecific although moderately suspicious for solitary pulmonary
metastasis. Attention on follow-up.
3. Coronary artery disease.
4. Cholelithiasis.

Aortic Atherosclerosis ([id number]).
